# Patient Record
Sex: Female | Born: 1935
Health system: Southern US, Community
[De-identification: ages and names within clinical notes are randomized; demographics above are authoritative.]

## PROBLEM LIST (undated history)

## (undated) DIAGNOSIS — M316 Other giant cell arteritis: Secondary | ICD-10-CM

## (undated) DIAGNOSIS — M810 Age-related osteoporosis without current pathological fracture: Secondary | ICD-10-CM

## (undated) DIAGNOSIS — R32 Unspecified urinary incontinence: Secondary | ICD-10-CM

## (undated) DIAGNOSIS — M069 Rheumatoid arthritis, unspecified: Secondary | ICD-10-CM

## (undated) DIAGNOSIS — G35 Multiple sclerosis: Secondary | ICD-10-CM

## (undated) DIAGNOSIS — Z780 Asymptomatic menopausal state: Secondary | ICD-10-CM

## (undated) DIAGNOSIS — M130 Polyarthritis, unspecified: Secondary | ICD-10-CM

## (undated) DIAGNOSIS — I872 Venous insufficiency (chronic) (peripheral): Secondary | ICD-10-CM

## (undated) HISTORY — DX: Polyarthritis, unspecified: M13.0

## (undated) HISTORY — DX: Venous insufficiency (chronic) (peripheral): I87.2

## (undated) HISTORY — DX: Age-related osteoporosis without current pathological fracture: M81.0

## (undated) HISTORY — DX: Other giant cell arteritis: M31.6

## (undated) HISTORY — DX: Unspecified urinary incontinence: R32

## (undated) HISTORY — DX: Asymptomatic menopausal state: Z78.0

## (undated) HISTORY — DX: Multiple sclerosis: G35

## (undated) HISTORY — DX: Rheumatoid arthritis, unspecified: M06.9

---

## 1969-12-09 HISTORY — PX: ABDOMINAL HYSTERECTOMY: SHX81

## 1995-05-10 HISTORY — PX: OTHER SURGICAL HISTORY: SHX169

## 1998-05-26 ENCOUNTER — Ambulatory Visit (HOSPITAL_COMMUNITY): Admission: RE | Admit: 1998-05-26 | Discharge: 1998-05-26 | Payer: Self-pay | Admitting: Infectious Diseases

## 2000-02-20 ENCOUNTER — Ambulatory Visit (HOSPITAL_BASED_OUTPATIENT_CLINIC_OR_DEPARTMENT_OTHER): Admission: RE | Admit: 2000-02-20 | Discharge: 2000-02-20 | Payer: Self-pay | Admitting: Orthopedic Surgery

## 2000-02-25 ENCOUNTER — Encounter: Admission: RE | Admit: 2000-02-25 | Discharge: 2000-04-04 | Payer: Self-pay | Admitting: Orthopedic Surgery

## 2000-06-12 ENCOUNTER — Other Ambulatory Visit: Admission: RE | Admit: 2000-06-12 | Discharge: 2000-06-12 | Payer: Self-pay | Admitting: Obstetrics and Gynecology

## 2000-09-11 ENCOUNTER — Encounter: Admission: RE | Admit: 2000-09-11 | Discharge: 2000-10-09 | Payer: Self-pay | Admitting: Orthopedic Surgery

## 2001-08-05 ENCOUNTER — Ambulatory Visit (HOSPITAL_COMMUNITY): Admission: RE | Admit: 2001-08-05 | Discharge: 2001-08-05 | Payer: Self-pay | Admitting: *Deleted

## 2001-12-18 ENCOUNTER — Encounter: Admission: RE | Admit: 2001-12-18 | Discharge: 2002-01-08 | Payer: Self-pay | Admitting: Orthopedic Surgery

## 2005-03-13 ENCOUNTER — Encounter: Admission: RE | Admit: 2005-03-13 | Discharge: 2005-03-13 | Payer: Self-pay | Admitting: Family Medicine

## 2009-03-12 ENCOUNTER — Inpatient Hospital Stay (HOSPITAL_COMMUNITY): Admission: EM | Admit: 2009-03-12 | Discharge: 2009-03-16 | Payer: Medicare Other | Admitting: Emergency Medicine

## 2009-12-09 DIAGNOSIS — M316 Other giant cell arteritis: Secondary | ICD-10-CM

## 2009-12-09 HISTORY — DX: Other giant cell arteritis: M31.6

## 2010-04-03 ENCOUNTER — Emergency Department (HOSPITAL_COMMUNITY): Admission: EM | Admit: 2010-04-03 | Discharge: 2010-04-03 | Payer: Self-pay | Admitting: Emergency Medicine

## 2010-04-17 ENCOUNTER — Encounter: Admission: RE | Admit: 2010-04-17 | Discharge: 2010-04-17 | Payer: Self-pay | Admitting: Surgery

## 2010-04-18 ENCOUNTER — Ambulatory Visit (HOSPITAL_BASED_OUTPATIENT_CLINIC_OR_DEPARTMENT_OTHER): Admission: RE | Admit: 2010-04-18 | Discharge: 2010-04-18 | Payer: Self-pay | Admitting: Surgery

## 2011-02-26 LAB — BASIC METABOLIC PANEL
BUN: 13 mg/dL (ref 6–23)
CO2: 29 mEq/L (ref 19–32)
Chloride: 95 mEq/L — ABNORMAL LOW (ref 96–112)
Creatinine, Ser: 0.81 mg/dL (ref 0.4–1.2)
GFR calc Af Amer: >60 mL/min (ref >60)
Sodium: 130 mEq/L — ABNORMAL LOW (ref 135–145)

## 2011-03-20 LAB — BASIC METABOLIC PANEL
BUN: 7 mg/dL (ref 6–23)
CO2: 27 mEq/L (ref 19–32)
CO2: 28 mEq/L (ref 19–32)
Chloride: 101 mEq/L (ref 96–112)
Creatinine, Ser: 0.65 mg/dL (ref 0.4–1.2)
GFR calc Af Amer: >60 mL/min (ref >60)
GFR calc Af Amer: >60 mL/min (ref >60)
GFR calc non Af Amer: >60 mL/min (ref >60)
GFR calc non Af Amer: >60 mL/min (ref >60)
Glucose, Bld: 113 mg/dL — ABNORMAL HIGH (ref 70–99)
Glucose, Bld: 115 mg/dL — ABNORMAL HIGH (ref 70–99)
Glucose, Bld: 151 mg/dL — ABNORMAL HIGH (ref 70–99)
Potassium: 4.3 mEq/L (ref 3.5–5.1)
Sodium: 131 mEq/L — ABNORMAL LOW (ref 135–145)
Sodium: 135 mEq/L (ref 135–145)

## 2011-03-20 LAB — COMPREHENSIVE METABOLIC PANEL
AST: 39 U/L — ABNORMAL HIGH (ref 0–37)
CO2: 22 mEq/L (ref 19–32)
Creatinine, Ser: 0.72 mg/dL (ref 0.4–1.2)
GFR calc Af Amer: >60 mL/min (ref >60)
GFR calc non Af Amer: >60 mL/min (ref >60)
Total Protein: 6.5 g/dL (ref 6.0–8.3)

## 2011-03-20 LAB — CBC
Hemoglobin: 13.3 g/dL (ref 12.0–15.0)
MCHC: 34.3 g/dL (ref 30.0–36.0)
MCHC: 34.8 g/dL (ref 30.0–36.0)
MCV: 90.8 fL (ref 78.0–100.0)
MCV: 91.1 fL (ref 78.0–100.0)
Platelets: 146 10*3/uL — ABNORMAL LOW (ref 150–400)
Platelets: 182 10*3/uL (ref 150–400)
RBC: 4.22 MIL/uL (ref 3.87–5.11)
RDW: 13 % (ref 11.5–15.5)
RDW: 13.1 % (ref 11.5–15.5)
WBC: 10.9 10*3/uL — ABNORMAL HIGH (ref 4.0–10.5)
WBC: 6.9 10*3/uL (ref 4.0–10.5)

## 2011-03-20 LAB — MISCELLANEOUS TEST

## 2011-04-23 NOTE — Discharge Summary (Signed)
NAMEGEANIE, PACIFICO                 ACCOUNT NO.:  [id number]   MEDICAL RECORD NO.:  [id number]          PATIENT TYPE:  INP   LOCATION:  1540                         FACILITY:  [facility]   PHYSICIAN:  Kela Millin, M.D.DATE OF BIRTH:  01/05/1932   DATE OF ADMISSION:  03/12/2009  DATE OF DISCHARGE:  03/16/2009                               DISCHARGE SUMMARY   DISCHARGE DIAGNOSES:  1. Left superior and inferior ischial pubic rami fracture.  2. Hyponatremia.  3. Osteoporosis.  4. History of partial rotator cuff tear, status post right shoulder      arthroscopy per Dr. Lajoyce Corners.  5. Constipation, secondary to narcotic pain medications, improved.   PROCEDURES AND STUDIES:  Left hip x-ray:  Negative for hip fracture;  fracture of the left superior and inferior ischial pubic rami.   BRIEF HISTORY:  The patient is a 77 year old white female, with the  above-listed medical problems, who presented with left hip pain, status  post fall.  She reported that she had just been processing down the  aisle in church in her choir robe when she tripped and fell.  She denied  dizziness, palpitations, focal weakness and no blurry vision.  After the  fall, she began having hip pain, was brought to the ED and x-ray of the  hip was done and the results as stated above.  The ED physician  consulted orthopedics and they stated that no surgical intervention was  needed.  They recommended weightbearing as tolerated with physical  therapy and requested admission to the medicine service.   Please see the full dictated admission history and physical for the  details of the admission physical exam as well as the laboratory data.   HOSPITAL COURSE:  1. Left superior and inferior ischial pubic rami fractures.  Upon      admission the patient was started on IV analgesics for pain control      and muscle relaxants as well.  As already mentioned above,      orthopedics on admission recommended physical therapy with   weightbearing as tolerated.  The patient's pain has gradually      improved and is now controlled on oral narcotics.  She has had      difficulties with nausea and vomiting associated with the pain      medications which has improved.  Physical therapy has followed the      patient during this hospital stay and have recommended a short term      skilled nursing.  Social work assisted with placement and the      family is in agreement with placement for rehab at [facility].  2. Osteoporosis.  The patient is to continue her outpatient      medications upon discharge.  3. Constipation.  The patient was started on Dulcolax and Senokot with      some relief.  She is to continue this upon discharge.  4. Hyponatremia, likely secondary to volume depletion.  The patient      received hydration with IV fluids.  5. History of partial rotator cuff tear, status  post arthroscopy and      debridement in the past per Dr. Lajoyce Corners.   DISCHARGE MEDICATIONS:  1. Skelaxin 800 mg p.o. q.i.d. for muscle spasms.  2. Senokot 2 p.o. q.h.s., hold if diarrhea.  3. Dulcolax 10 mg p.o./per rectum daily p.r.n. constipation.  4. Percocet 1-2 tablets p.o. q.4-6 hours p.r.n.  5. The patient to continue vitamin D 50,000 units p.o. once weekly on      Tuesday.  6. Continue Fosamax 70 mg p.o. once weekly on Saturdays.  7. Premarin 0.3 mg p.o. daily.  8. Glucosamine chondroitin 1 p.o. t.i.d.  9. Calcium 500 mg 1 p.o. t.i.d.  10.Vitamin B Forte 1 p.o. daily.   FOLLOW-UP CARE:  1. Dr. Lajoyce Corners, call for appointment upon discharge.  2. Nursing home physician in 1-2 days.  3. Dr. Manus Gunning, to be indicated per nursing home physician upon      discharge.  4. PT, OT at nursing facility.   DISCHARGE CONDITION -:  Improved and stable.      Kela Millin, M.D.  Electronically Signed     ACV/MEDQ  D:  03/16/2009  T:  03/16/2009  Job:  [id number]   cc:   Bryan Lemma. Manus Gunning, M.D.  Fax: [phone]   Nadara Mustard, MD  Fax:  [phone]

## 2011-04-23 NOTE — H&P (Signed)
Brown, Gabriela                 ACCOUNT NO.:  [id number]   MEDICAL RECORD NO.:  [id number]          PATIENT TYPE:  INP   LOCATION:  0104                         FACILITY:  [facility]   PHYSICIAN:  Kela Millin, M.D.DATE OF BIRTH:  06-18-1932   DATE OF ADMISSION:  03/12/2009  DATE OF DISCHARGE:                              HISTORY & PHYSICAL   PRIMARY CARE PHYSICIAN:  Bryan Lemma. Ehinger, M.D.   CHIEF COMPLAINT:  Left hip pain status post fall.   HISTORY OF PRESENT ILLNESS:  The patient is a 77 year old white female  with past medical history significant for osteoporosis who presents with  above complaints.  She states that she is otherwise very healthy and  walks a mile every day.  She was in church just proceeding down the  aisle in her choir robe and she thinks she might have tripped on her  robe or on the carpet and fell.  She denies any dizziness, palpitations,  focal weakness or blurred vision.  After the fall she began having left  hip pain and so was brought to the ED.   In the ED she had an x-ray which showed left superior and inferior  ischiopubic ramus fractures.  The ED physician consulted orthopedics -  spoke with Dr. Magnus Ivan on call for Dr. Lajoyce Corners and weightbearing as  tolerated recommended and admission to the medicine service.  She denies  any shortness of breath.  No dysuria, cough or leg swelling.   PAST MEDICAL HISTORY:  As above.  Also history of partial rotator cuff  tear and status post right shoulder arthroscopy with debridement.   MEDICATIONS:  1. Fosamax 70 q. weekly.  2. Vitamin D 50,000 units q. Tuesday.  3. Premarin 0.5 mg daily.  4. Glucosamine chondroitin.  5. Calcium 500 t.i.d.  6. Vitamin B 40.   ALLERGIES:  ASPIRIN, SULFA.   SOCIAL HISTORY:  She denies tobacco.  Also denies alcohol.   FAMILY HISTORY:  Her father had strokes.   REVIEW OF SYSTEMS:  As per HPI, other review of systems negative.   PHYSICAL EXAM:  GENERAL:  In general the  patient is a pleasant, elderly  white female who appears to be in moderate distress secondary to pain,  in no respiratory distress.  VITAL SIGNS:  Her temperature is 97.9 with a blood pressure of 129/62,  pulse of 93, respiratory rate of 20, O2 sat of 98%.  HEENT:  PERRL, EOMI, sclerae anicteric.  Moist mucous membranes and no  oral exudates.  NECK:  Supple.  No adenopathy, no thyromegaly and no JVD.  LUNGS:  Clear to auscultation bilaterally.  No crackles or wheezes.  CARDIOVASCULAR:  Regular rate and rhythm.  Normal S1 and S2.  ABDOMEN:  Soft.  Normal bowel sounds present.  Nontender.  Nondistended.  No hepatomegaly and no masses palpable.  EXTREMITIES:  No cyanosis and no edema.  Range of motion was limited in  the left lower extremity secondary to pain.  NEUROLOGICAL:  Alert and oriented x3.  Cranial nerves II-XII grossly  intact.  Nonfocal exam.   LABORATORY DATA:  As per HPI.   ASSESSMENT AND PLAN:  1. Left superior and inferior ischiopubic rami fractures - as      discussed above will admit for pain management.  Consult PT/OT,      weightbearing as tolerated.  Orthopedics consulted in the ED as      discussed above.  2. Osteoporosis - continue outpatient medications.      Kela Millin, M.D.  Electronically Signed     ACV/MEDQ  D:  03/12/2009  T:  03/12/2009  Job:  [id number]   cc:   Bryan Lemma. Manus Gunning, M.D.  Fax: [phone]   Nadara Mustard, MD  Fax: [phone]

## 2011-04-26 NOTE — Op Note (Signed)
[place]  Patient:    [name], [name]                          MRN: [id number] Proc. Date: 02/20/00 Attending:  Nadara Mustard, M.D.                           Operative Report  PREOPERATIVE DIAGNOSIS:  Frozen right shoulder with rotator cuff tear.  POSTOPERATIVE DIAGNOSES: 1. Frozen right shoulder. 2. Partial rotator cuff tear. 3. Grade 1 superior labrum anterior and posterior lesion. 4. Subscapularis adhesions. 5. Impingement syndrome with type 3 acromion.  PROCEDURE: 1. Manipulation under anesthesia. 2. Right shoulder arthroscopy with massive debridement of partial rotator cuff    tear, and debridement of SLAP lesion, and debridement of subscapularis adhesive    bursitis. 3. Subacromial decompression with partial bursectomy.  SURGEON:  Nadara Mustard, M.D.  ASSISTANT:  _______.  ANESTHESIA:  General endotracheal.  ESTIMATED BLOOD LOSS:  Minimal.  ANTIBIOTICS:  None.  DRAINS:  None.  COMPLICATIONS:  None.  Alpha pump used for 48 hours postoperative pain medication with 150 cc of 0.5% Marcaine plain.  DISPOSITION:  To PACU in stable condition.  INDICATIONS FOR PROCEDURE:  The patient is a 77 year old woman who has had a prolonged course of frozen right shoulder pain with activities of daily living.  She has failed conservative care with injection therapy, nonsteroidals, and presents at this time for arthroscopic intervention.  The risks and benefits have been discussed including infection, neurovascular injury, persistent pain, and persistent adhesions.  The patient states she understands and wishes to proceed at this time.  DESCRIPTION OF PROCEDURE:  The patient was brought to OR room #5 and underwent  general endotracheal anesthetic.  After an adequate level of anesthesia was obtained, the patients right upper extremity was prepped using DuraPrep, and draped into the sterile field with the patient in the beach chair  position. The scope was inserted through the posterior portal into the shoulder joint, and the instruments were inserted through the anterior portal with an 18-gauge needle used to localize the anterior portal from the outside in technique.  The visualization after manipulation under anesthesia showed massive subscapularis bursal adhesions to the subscapularis.  There was a large grade 1 SLAP lesion, as well as large partial thickness rotator cuff tear.  The vapor Mitek was used for hemostasis and the shaver was used for debridement, and the above mentioned injuries were debrided with the shaver and the vapor Mitek.  The patient had good range of motion after the manipulation under anesthesia.  The instruments were removed and then inserted into the subacromial space with he shaver from the lateral portal and the scope from the posterior portal.  A subacromial decompression was performed.  She also had significant osteoarthritis of the [facility] joint.  This was debrided.  She had a good decompression.  The instruments were removed.  The Alpha pump 4 cc tube was then inserted into the subacromial space with it attached to the Alpha pump.  The portals were closed using 3-0 nylon.  The wound were covered with Adaptic, orthopedic sponges, ABD dressing, and Hypafix tape.  The patient was extubated and taken to PACU in stable condition.  Plan to remove the drain in 48 hours and the dressing in 48 hours.  Followup in the office in ne week.  The patient has pain medicine prescriptions. DD:  02/20/00 TD:  02/20/00 Job: [id number]

## 2011-10-10 HISTORY — PX: COLONOSCOPY: SHX174

## 2012-01-10 DIAGNOSIS — Z0000 Encounter for general adult medical examination without abnormal findings: Secondary | ICD-10-CM | POA: Diagnosis not present

## 2012-01-10 DIAGNOSIS — E7800 Pure hypercholesterolemia, unspecified: Secondary | ICD-10-CM | POA: Diagnosis not present

## 2012-01-10 DIAGNOSIS — E559 Vitamin D deficiency, unspecified: Secondary | ICD-10-CM | POA: Diagnosis not present

## 2012-01-10 DIAGNOSIS — Z124 Encounter for screening for malignant neoplasm of cervix: Secondary | ICD-10-CM | POA: Diagnosis not present

## 2012-01-10 DIAGNOSIS — Z01419 Encounter for gynecological examination (general) (routine) without abnormal findings: Secondary | ICD-10-CM | POA: Diagnosis not present

## 2012-01-14 DIAGNOSIS — M810 Age-related osteoporosis without current pathological fracture: Secondary | ICD-10-CM | POA: Diagnosis not present

## 2012-01-24 DIAGNOSIS — E871 Hypo-osmolality and hyponatremia: Secondary | ICD-10-CM | POA: Diagnosis not present

## 2012-01-24 DIAGNOSIS — M810 Age-related osteoporosis without current pathological fracture: Secondary | ICD-10-CM | POA: Diagnosis not present

## 2012-01-24 DIAGNOSIS — Z8262 Family history of osteoporosis: Secondary | ICD-10-CM | POA: Diagnosis not present

## 2012-01-27 DIAGNOSIS — E559 Vitamin D deficiency, unspecified: Secondary | ICD-10-CM | POA: Diagnosis not present

## 2012-01-27 DIAGNOSIS — R498 Other voice and resonance disorders: Secondary | ICD-10-CM | POA: Diagnosis not present

## 2012-01-27 DIAGNOSIS — E039 Hypothyroidism, unspecified: Secondary | ICD-10-CM | POA: Diagnosis not present

## 2012-01-27 DIAGNOSIS — E7800 Pure hypercholesterolemia, unspecified: Secondary | ICD-10-CM | POA: Diagnosis not present

## 2012-01-27 DIAGNOSIS — M810 Age-related osteoporosis without current pathological fracture: Secondary | ICD-10-CM | POA: Diagnosis not present

## 2012-01-27 DIAGNOSIS — E871 Hypo-osmolality and hyponatremia: Secondary | ICD-10-CM | POA: Diagnosis not present

## 2012-02-13 DIAGNOSIS — R490 Dysphonia: Secondary | ICD-10-CM | POA: Diagnosis not present

## 2012-03-19 DIAGNOSIS — Z1231 Encounter for screening mammogram for malignant neoplasm of breast: Secondary | ICD-10-CM | POA: Diagnosis not present

## 2012-03-26 DIAGNOSIS — E039 Hypothyroidism, unspecified: Secondary | ICD-10-CM | POA: Diagnosis not present

## 2012-07-28 DIAGNOSIS — E039 Hypothyroidism, unspecified: Secondary | ICD-10-CM | POA: Diagnosis not present

## 2012-07-28 DIAGNOSIS — M810 Age-related osteoporosis without current pathological fracture: Secondary | ICD-10-CM | POA: Diagnosis not present

## 2012-07-28 DIAGNOSIS — E871 Hypo-osmolality and hyponatremia: Secondary | ICD-10-CM | POA: Diagnosis not present

## 2012-07-28 DIAGNOSIS — R609 Edema, unspecified: Secondary | ICD-10-CM | POA: Diagnosis not present

## 2012-07-28 DIAGNOSIS — E559 Vitamin D deficiency, unspecified: Secondary | ICD-10-CM | POA: Diagnosis not present

## 2012-10-13 DIAGNOSIS — Z23 Encounter for immunization: Secondary | ICD-10-CM | POA: Diagnosis not present

## 2013-02-17 DIAGNOSIS — R609 Edema, unspecified: Secondary | ICD-10-CM | POA: Diagnosis not present

## 2013-02-17 DIAGNOSIS — M810 Age-related osteoporosis without current pathological fracture: Secondary | ICD-10-CM | POA: Diagnosis not present

## 2013-02-17 DIAGNOSIS — E559 Vitamin D deficiency, unspecified: Secondary | ICD-10-CM | POA: Diagnosis not present

## 2013-02-17 DIAGNOSIS — E039 Hypothyroidism, unspecified: Secondary | ICD-10-CM | POA: Diagnosis not present

## 2013-02-17 DIAGNOSIS — E871 Hypo-osmolality and hyponatremia: Secondary | ICD-10-CM | POA: Diagnosis not present

## 2013-02-17 DIAGNOSIS — R42 Dizziness and giddiness: Secondary | ICD-10-CM | POA: Diagnosis not present

## 2013-02-18 DIAGNOSIS — R269 Unspecified abnormalities of gait and mobility: Secondary | ICD-10-CM | POA: Diagnosis not present

## 2013-03-02 DIAGNOSIS — R269 Unspecified abnormalities of gait and mobility: Secondary | ICD-10-CM | POA: Diagnosis not present

## 2013-03-04 DIAGNOSIS — R269 Unspecified abnormalities of gait and mobility: Secondary | ICD-10-CM | POA: Diagnosis not present

## 2013-03-05 ENCOUNTER — Telehealth: Payer: Self-pay | Admitting: Nurse Practitioner

## 2013-03-05 MED ORDER — ESTRADIOL 0.5 MG PO TABS: ORAL_TABLET | ORAL | Status: DC

## 2013-03-05 NOTE — Telephone Encounter (Signed)
Refill to [facility], give pt a call when refill of estrodial is called in /nr

## 2013-03-05 NOTE — Telephone Encounter (Signed)
03/05/13 rx for estradiol 0.5mg  sent to [facility]. Pt is to take 1/2 tab daily. Pt has aex 5/14. Patient notified

## 2013-03-16 DIAGNOSIS — R269 Unspecified abnormalities of gait and mobility: Secondary | ICD-10-CM | POA: Diagnosis not present

## 2013-03-25 DIAGNOSIS — Z1231 Encounter for screening mammogram for malignant neoplasm of breast: Secondary | ICD-10-CM | POA: Diagnosis not present

## 2013-03-26 DIAGNOSIS — R269 Unspecified abnormalities of gait and mobility: Secondary | ICD-10-CM | POA: Diagnosis not present

## 2013-04-15 DIAGNOSIS — L84 Corns and callosities: Secondary | ICD-10-CM | POA: Diagnosis not present

## 2013-04-15 DIAGNOSIS — D485 Neoplasm of uncertain behavior of skin: Secondary | ICD-10-CM | POA: Diagnosis not present

## 2013-04-15 DIAGNOSIS — L259 Unspecified contact dermatitis, unspecified cause: Secondary | ICD-10-CM | POA: Diagnosis not present

## 2013-04-16 ENCOUNTER — Encounter: Payer: Self-pay | Admitting: *Deleted

## 2013-04-19 ENCOUNTER — Encounter: Payer: Self-pay | Admitting: Nurse Practitioner

## 2013-04-19 ENCOUNTER — Ambulatory Visit (INDEPENDENT_AMBULATORY_CARE_PROVIDER_SITE_OTHER): Payer: Medicare Other | Admitting: Nurse Practitioner

## 2013-04-19 VITALS — BP 114/62 | HR 66 | Resp 12 | Ht 65.5 in | Wt 124.2 lb

## 2013-04-19 DIAGNOSIS — N952 Postmenopausal atrophic vaginitis: Secondary | ICD-10-CM

## 2013-04-19 DIAGNOSIS — Z01419 Encounter for gynecological examination (general) (routine) without abnormal findings: Secondary | ICD-10-CM | POA: Diagnosis not present

## 2013-04-19 MED ORDER — ESTRADIOL 0.5 MG PO TABS: ORAL_TABLET | ORAL | Status: DC

## 2013-04-19 NOTE — Patient Instructions (Addendum)
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
## 2013-04-22 NOTE — Progress Notes (Signed)
Encounter reviewed by Dr. Brook Silva.  

## 2013-08-02 DIAGNOSIS — R609 Edema, unspecified: Secondary | ICD-10-CM | POA: Diagnosis not present

## 2013-08-02 DIAGNOSIS — M79609 Pain in unspecified limb: Secondary | ICD-10-CM | POA: Diagnosis not present

## 2013-08-03 DIAGNOSIS — M48061 Spinal stenosis, lumbar region without neurogenic claudication: Secondary | ICD-10-CM | POA: Diagnosis not present

## 2013-08-18 DIAGNOSIS — M47817 Spondylosis without myelopathy or radiculopathy, lumbosacral region: Secondary | ICD-10-CM | POA: Diagnosis not present

## 2013-08-20 DIAGNOSIS — M810 Age-related osteoporosis without current pathological fracture: Secondary | ICD-10-CM | POA: Diagnosis not present

## 2013-08-20 DIAGNOSIS — E559 Vitamin D deficiency, unspecified: Secondary | ICD-10-CM | POA: Diagnosis not present

## 2013-08-20 DIAGNOSIS — E039 Hypothyroidism, unspecified: Secondary | ICD-10-CM | POA: Diagnosis not present

## 2013-08-20 DIAGNOSIS — E871 Hypo-osmolality and hyponatremia: Secondary | ICD-10-CM | POA: Diagnosis not present

## 2013-08-20 DIAGNOSIS — R609 Edema, unspecified: Secondary | ICD-10-CM | POA: Diagnosis not present

## 2013-08-24 DIAGNOSIS — R262 Difficulty in walking, not elsewhere classified: Secondary | ICD-10-CM | POA: Diagnosis not present

## 2013-09-06 DIAGNOSIS — M47817 Spondylosis without myelopathy or radiculopathy, lumbosacral region: Secondary | ICD-10-CM | POA: Diagnosis not present

## 2013-09-06 DIAGNOSIS — M1990 Unspecified osteoarthritis, unspecified site: Secondary | ICD-10-CM | POA: Diagnosis not present

## 2013-09-06 DIAGNOSIS — G5790 Unspecified mononeuropathy of unspecified lower limb: Secondary | ICD-10-CM | POA: Diagnosis not present

## 2013-09-06 DIAGNOSIS — M5137 Other intervertebral disc degeneration, lumbosacral region: Secondary | ICD-10-CM | POA: Diagnosis not present

## 2013-09-16 DIAGNOSIS — R269 Unspecified abnormalities of gait and mobility: Secondary | ICD-10-CM | POA: Diagnosis not present

## 2013-09-20 DIAGNOSIS — R269 Unspecified abnormalities of gait and mobility: Secondary | ICD-10-CM | POA: Diagnosis not present

## 2013-09-24 DIAGNOSIS — R269 Unspecified abnormalities of gait and mobility: Secondary | ICD-10-CM | POA: Diagnosis not present

## 2013-09-28 DIAGNOSIS — R269 Unspecified abnormalities of gait and mobility: Secondary | ICD-10-CM | POA: Diagnosis not present

## 2013-09-30 DIAGNOSIS — H35369 Drusen (degenerative) of macula, unspecified eye: Secondary | ICD-10-CM | POA: Diagnosis not present

## 2013-09-30 DIAGNOSIS — Z961 Presence of intraocular lens: Secondary | ICD-10-CM | POA: Diagnosis not present

## 2013-10-01 DIAGNOSIS — R269 Unspecified abnormalities of gait and mobility: Secondary | ICD-10-CM | POA: Diagnosis not present

## 2013-10-04 DIAGNOSIS — G894 Chronic pain syndrome: Secondary | ICD-10-CM | POA: Diagnosis not present

## 2013-10-04 DIAGNOSIS — Z79899 Other long term (current) drug therapy: Secondary | ICD-10-CM | POA: Diagnosis not present

## 2013-10-04 DIAGNOSIS — IMO0001 Reserved for inherently not codable concepts without codable children: Secondary | ICD-10-CM | POA: Diagnosis not present

## 2013-10-04 DIAGNOSIS — M4120 Other idiopathic scoliosis, site unspecified: Secondary | ICD-10-CM | POA: Diagnosis not present

## 2013-10-06 DIAGNOSIS — R269 Unspecified abnormalities of gait and mobility: Secondary | ICD-10-CM | POA: Diagnosis not present

## 2013-10-08 DIAGNOSIS — R269 Unspecified abnormalities of gait and mobility: Secondary | ICD-10-CM | POA: Diagnosis not present

## 2013-10-11 DIAGNOSIS — R269 Unspecified abnormalities of gait and mobility: Secondary | ICD-10-CM | POA: Diagnosis not present

## 2013-10-12 DIAGNOSIS — Z23 Encounter for immunization: Secondary | ICD-10-CM | POA: Diagnosis not present

## 2013-10-13 DIAGNOSIS — H43819 Vitreous degeneration, unspecified eye: Secondary | ICD-10-CM | POA: Diagnosis not present

## 2013-10-13 DIAGNOSIS — H35379 Puckering of macula, unspecified eye: Secondary | ICD-10-CM | POA: Diagnosis not present

## 2013-10-13 DIAGNOSIS — H353190 Nonexudative age-related macular degeneration, unspecified eye, stage unspecified: Secondary | ICD-10-CM | POA: Diagnosis not present

## 2013-10-15 DIAGNOSIS — R269 Unspecified abnormalities of gait and mobility: Secondary | ICD-10-CM | POA: Diagnosis not present

## 2013-10-18 DIAGNOSIS — R269 Unspecified abnormalities of gait and mobility: Secondary | ICD-10-CM | POA: Diagnosis not present

## 2013-10-20 DIAGNOSIS — G959 Disease of spinal cord, unspecified: Secondary | ICD-10-CM | POA: Diagnosis not present

## 2013-10-20 DIAGNOSIS — R269 Unspecified abnormalities of gait and mobility: Secondary | ICD-10-CM | POA: Diagnosis not present

## 2013-10-21 ENCOUNTER — Other Ambulatory Visit: Payer: Self-pay | Admitting: Neurology

## 2013-10-21 DIAGNOSIS — R269 Unspecified abnormalities of gait and mobility: Secondary | ICD-10-CM

## 2013-10-22 DIAGNOSIS — R269 Unspecified abnormalities of gait and mobility: Secondary | ICD-10-CM | POA: Diagnosis not present

## 2013-10-25 DIAGNOSIS — R269 Unspecified abnormalities of gait and mobility: Secondary | ICD-10-CM | POA: Diagnosis not present

## 2013-10-29 DIAGNOSIS — R269 Unspecified abnormalities of gait and mobility: Secondary | ICD-10-CM | POA: Diagnosis not present

## 2013-10-30 ENCOUNTER — Ambulatory Visit
Admission: RE | Admit: 2013-10-30 | Discharge: 2013-10-30 | Disposition: A | Discharge status: Home / Self Care | Payer: PRIVATE HEALTH INSURANCE | Source: Ambulatory Visit | Attending: Neurology | Admitting: Neurology

## 2013-10-30 ENCOUNTER — Ambulatory Visit
Admission: RE | Admit: 2013-10-30 | Discharge: 2013-10-30 | Disposition: A | Discharge status: Home / Self Care | Payer: Medicare Other | Source: Ambulatory Visit | Attending: Neurology | Admitting: Neurology

## 2013-10-30 DIAGNOSIS — M47812 Spondylosis without myelopathy or radiculopathy, cervical region: Secondary | ICD-10-CM | POA: Diagnosis not present

## 2013-10-30 DIAGNOSIS — R269 Unspecified abnormalities of gait and mobility: Secondary | ICD-10-CM

## 2013-10-30 DIAGNOSIS — M5090 Cervical disc disorder, unspecified, unspecified cervical region: Secondary | ICD-10-CM | POA: Diagnosis not present

## 2013-10-30 DIAGNOSIS — M47814 Spondylosis without myelopathy or radiculopathy, thoracic region: Secondary | ICD-10-CM | POA: Diagnosis not present

## 2013-11-01 ENCOUNTER — Other Ambulatory Visit: Payer: Self-pay | Admitting: Neurology

## 2013-11-01 DIAGNOSIS — R269 Unspecified abnormalities of gait and mobility: Secondary | ICD-10-CM | POA: Diagnosis not present

## 2013-11-01 DIAGNOSIS — G959 Disease of spinal cord, unspecified: Secondary | ICD-10-CM

## 2013-11-11 ENCOUNTER — Ambulatory Visit
Admission: RE | Admit: 2013-11-11 | Discharge: 2013-11-11 | Disposition: A | Discharge status: Home / Self Care | Payer: PRIVATE HEALTH INSURANCE | Source: Ambulatory Visit | Attending: Neurology | Admitting: Neurology

## 2013-11-11 DIAGNOSIS — M5090 Cervical disc disorder, unspecified, unspecified cervical region: Secondary | ICD-10-CM | POA: Diagnosis not present

## 2013-11-11 DIAGNOSIS — G959 Disease of spinal cord, unspecified: Secondary | ICD-10-CM

## 2013-11-11 DIAGNOSIS — J438 Other emphysema: Secondary | ICD-10-CM | POA: Diagnosis not present

## 2013-11-11 DIAGNOSIS — M4000 Postural kyphosis, site unspecified: Secondary | ICD-10-CM | POA: Diagnosis not present

## 2013-11-11 DIAGNOSIS — R269 Unspecified abnormalities of gait and mobility: Secondary | ICD-10-CM

## 2013-11-11 MED ORDER — GADOBENATE DIMEGLUMINE 529 MG/ML IV SOLN
10.0000 mL | Freq: Once | INTRAVENOUS | Status: AC | PRN
  Administered 2013-11-11: 10 mL via INTRAVENOUS

## 2013-11-13 DIAGNOSIS — R269 Unspecified abnormalities of gait and mobility: Secondary | ICD-10-CM | POA: Diagnosis not present

## 2013-11-13 DIAGNOSIS — Z79899 Other long term (current) drug therapy: Secondary | ICD-10-CM | POA: Diagnosis not present

## 2013-11-13 DIAGNOSIS — G959 Disease of spinal cord, unspecified: Secondary | ICD-10-CM | POA: Diagnosis not present

## 2013-11-15 DIAGNOSIS — R269 Unspecified abnormalities of gait and mobility: Secondary | ICD-10-CM | POA: Diagnosis not present

## 2013-11-15 DIAGNOSIS — G959 Disease of spinal cord, unspecified: Secondary | ICD-10-CM | POA: Diagnosis not present

## 2013-11-15 DIAGNOSIS — Z79899 Other long term (current) drug therapy: Secondary | ICD-10-CM | POA: Diagnosis not present

## 2013-11-16 DIAGNOSIS — R269 Unspecified abnormalities of gait and mobility: Secondary | ICD-10-CM | POA: Diagnosis not present

## 2013-11-16 DIAGNOSIS — G35 Multiple sclerosis: Secondary | ICD-10-CM | POA: Diagnosis not present

## 2013-11-16 DIAGNOSIS — G959 Disease of spinal cord, unspecified: Secondary | ICD-10-CM | POA: Diagnosis not present

## 2013-11-19 DIAGNOSIS — L84 Corns and callosities: Secondary | ICD-10-CM | POA: Diagnosis not present

## 2013-11-19 DIAGNOSIS — D709 Neutropenia, unspecified: Secondary | ICD-10-CM | POA: Diagnosis not present

## 2013-11-19 DIAGNOSIS — R29818 Other symptoms and signs involving the nervous system: Secondary | ICD-10-CM | POA: Diagnosis not present

## 2013-11-22 DIAGNOSIS — R269 Unspecified abnormalities of gait and mobility: Secondary | ICD-10-CM | POA: Diagnosis not present

## 2013-12-06 ENCOUNTER — Encounter: Payer: Self-pay | Admitting: Nurse Practitioner

## 2013-12-09 DIAGNOSIS — G35 Multiple sclerosis: Secondary | ICD-10-CM

## 2013-12-09 HISTORY — DX: Multiple sclerosis: G35

## 2013-12-17 DIAGNOSIS — R269 Unspecified abnormalities of gait and mobility: Secondary | ICD-10-CM | POA: Diagnosis not present

## 2013-12-27 DIAGNOSIS — R269 Unspecified abnormalities of gait and mobility: Secondary | ICD-10-CM | POA: Diagnosis not present

## 2014-01-03 DIAGNOSIS — R269 Unspecified abnormalities of gait and mobility: Secondary | ICD-10-CM | POA: Diagnosis not present

## 2014-01-04 DIAGNOSIS — L820 Inflamed seborrheic keratosis: Secondary | ICD-10-CM | POA: Diagnosis not present

## 2014-01-04 DIAGNOSIS — L821 Other seborrheic keratosis: Secondary | ICD-10-CM | POA: Diagnosis not present

## 2014-01-10 DIAGNOSIS — R269 Unspecified abnormalities of gait and mobility: Secondary | ICD-10-CM | POA: Diagnosis not present

## 2014-01-13 DIAGNOSIS — Z8262 Family history of osteoporosis: Secondary | ICD-10-CM | POA: Diagnosis not present

## 2014-01-13 DIAGNOSIS — M949 Disorder of cartilage, unspecified: Secondary | ICD-10-CM | POA: Diagnosis not present

## 2014-01-13 DIAGNOSIS — M899 Disorder of bone, unspecified: Secondary | ICD-10-CM | POA: Diagnosis not present

## 2014-01-25 ENCOUNTER — Telehealth: Payer: Self-pay | Admitting: *Deleted

## 2014-01-25 NOTE — Telephone Encounter (Signed)
I have attempted to contact this patient by phone with the following results: no answer after 7 rings.

## 2014-01-31 DIAGNOSIS — L851 Acquired keratosis [keratoderma] palmaris et plantaris: Secondary | ICD-10-CM | POA: Diagnosis not present

## 2014-01-31 DIAGNOSIS — M2040 Other hammer toe(s) (acquired), unspecified foot: Secondary | ICD-10-CM | POA: Diagnosis not present

## 2014-02-14 DIAGNOSIS — G959 Disease of spinal cord, unspecified: Secondary | ICD-10-CM | POA: Diagnosis not present

## 2014-02-14 DIAGNOSIS — G35 Multiple sclerosis: Secondary | ICD-10-CM | POA: Diagnosis not present

## 2014-02-22 DIAGNOSIS — E871 Hypo-osmolality and hyponatremia: Secondary | ICD-10-CM | POA: Diagnosis not present

## 2014-02-22 DIAGNOSIS — M810 Age-related osteoporosis without current pathological fracture: Secondary | ICD-10-CM | POA: Diagnosis not present

## 2014-02-22 DIAGNOSIS — R609 Edema, unspecified: Secondary | ICD-10-CM | POA: Diagnosis not present

## 2014-02-22 DIAGNOSIS — E039 Hypothyroidism, unspecified: Secondary | ICD-10-CM | POA: Diagnosis not present

## 2014-02-22 DIAGNOSIS — E559 Vitamin D deficiency, unspecified: Secondary | ICD-10-CM | POA: Diagnosis not present

## 2014-02-22 DIAGNOSIS — G959 Disease of spinal cord, unspecified: Secondary | ICD-10-CM | POA: Diagnosis not present

## 2014-03-01 NOTE — Telephone Encounter (Signed)
Phone call to patient.  She has received results from Dr. Almetta Lovely office.

## 2014-03-21 DIAGNOSIS — M2010 Hallux valgus (acquired), unspecified foot: Secondary | ICD-10-CM | POA: Diagnosis not present

## 2014-03-21 DIAGNOSIS — M2040 Other hammer toe(s) (acquired), unspecified foot: Secondary | ICD-10-CM | POA: Diagnosis not present

## 2014-03-30 DIAGNOSIS — Z1231 Encounter for screening mammogram for malignant neoplasm of breast: Secondary | ICD-10-CM | POA: Diagnosis not present

## 2014-04-22 ENCOUNTER — Ambulatory Visit: Payer: Medicare Other | Admitting: Nurse Practitioner

## 2014-04-29 ENCOUNTER — Ambulatory Visit (INDEPENDENT_AMBULATORY_CARE_PROVIDER_SITE_OTHER): Payer: Medicare Other | Admitting: Nurse Practitioner

## 2014-04-29 ENCOUNTER — Encounter: Payer: Self-pay | Admitting: Nurse Practitioner

## 2014-04-29 VITALS — BP 130/64 | HR 80 | Ht 65.25 in | Wt 130.0 lb

## 2014-04-29 DIAGNOSIS — G35 Multiple sclerosis: Secondary | ICD-10-CM

## 2014-04-29 DIAGNOSIS — N952 Postmenopausal atrophic vaginitis: Secondary | ICD-10-CM

## 2014-04-29 DIAGNOSIS — Z79890 Hormone replacement therapy (postmenopausal): Secondary | ICD-10-CM | POA: Diagnosis not present

## 2014-04-29 DIAGNOSIS — Z124 Encounter for screening for malignant neoplasm of cervix: Secondary | ICD-10-CM | POA: Diagnosis not present

## 2014-04-29 DIAGNOSIS — M810 Age-related osteoporosis without current pathological fracture: Secondary | ICD-10-CM

## 2014-04-29 DIAGNOSIS — Z01419 Encounter for gynecological examination (general) (routine) without abnormal findings: Secondary | ICD-10-CM

## 2014-04-29 MED ORDER — ESTRADIOL 0.5 MG PO TABS: ORAL_TABLET | ORAL | Status: DC

## 2014-04-29 MED ORDER — ESTRADIOL 10 MCG VA TABS: 1.0000 | ORAL_TABLET | VAGINAL | Status: DC

## 2014-04-29 MED ORDER — VITAMIN D (ERGOCALCIFEROL) 1.25 MG (50000 UNIT) PO CAPS: 50000.0000 [IU] | ORAL_CAPSULE | ORAL | Status: DC

## 2014-04-29 NOTE — Progress Notes (Signed)
Patient ID: [name], female   DOB: August 07, 1932, 80 y.o.   MRN: [id number] 80 y.o. G2P2 Widowed Caucasian Fe here for annual exam.  She continues on ERT at 1/2 tablet daily.  She does have vaginal dryness but is not SA. Some bladder issues with urinary urgency but no incontinence.   She just lost her husband in August and is having a bad time coping with yet another husband that has passed. She had remarried in 6/ 2011 and he had a diagnosis of prostate cancer that he thought was in remission.  His 2 children have been very gracious and kind to her.  She left everything that belong to her husband for his children. About a month ago she continued having problems with balance and has now been diagnosed with MS.  She is using a cane and is very careful about going anywhere alone.  The left foot drop over the years is now thought to be due to [name] - not diagnosed then.  She has osteoporosis and knows she must be very careful to prevent falls.  She has a housekeeper that comes every other week and someone to do the lawn upkeep.  Patient's last menstrual period was 09/08/1970.          Sexually active: no  The current method of family planning is abstinence and status post hysterectomy.    Exercising: yes  Gym/ health club routine includes water exercises and machines. Smoker:  no  Health Maintenance: Pap: 06/12/2000 Normal  MMG: 03/30/14, 3D, benign Colonoscopy: 10/2011 normal repeat 10 years  BMD: 01/13/14, Osteoporosis TDaP: 2006  Labs:  PCP   reports that she has never smoked. She has never used smokeless tobacco. She reports that she drinks about 3.6 ounces of alcohol per week. She reports that she does not use illicit drugs.  Past Medical History  Diagnosis Date  . Post-menopausal   . Osteoporosis     osteopenia  . Multiple sclerosis     Past Surgical History  Procedure Laterality Date  . Colonoscopy  10/2011  . Breast ductectomy Right 05/1995  . Abdominal hysterectomy  1971    TVH     Current Outpatient Prescriptions  Medication Sig Dispense Refill  . Ascorbic Acid (VITAMIN C) 100 MG tablet Take 100 mg by mouth daily.      Marland Kitchen azelastine (ASTELIN) 137 MCG/SPRAY nasal spray Place 1 spray into the nose 2 (two) times daily. Use in each nostril as directed      . Biotin 1 MG CAPS Take by mouth daily.      . Calcium Carbonate-Vitamin D (CALCIUM + D PO) Take by mouth daily.      Marland Kitchen estradiol (ESTRACE) 0.5 MG tablet Take 1/2 tablet daily  90 tablet  3  . glucosamine-chondroitin 500-400 MG tablet Take 1 tablet by mouth 3 (three) times daily.      Marland Kitchen levothyroxine (SYNTHROID, LEVOTHROID) 25 MCG tablet Take 25 mcg by mouth daily.      . Magnesium 500 MG TABS Take 1 tablet by mouth daily.      . Vitamin D, Ergocalciferol, (DRISDOL) 50000 UNITS CAPS Take 50,000 Units by mouth every 7 (seven) days.      . vitamin E 100 UNIT capsule Take 100 Units by mouth daily.      . Estradiol 10 MCG TABS vaginal tablet Place 1 tablet (10 mcg total) vaginally 2 (two) times a week.  24 tablet  3  . fluocinonide cream (LIDEX) 0.05 %  4.09 application.       No current facility-administered medications for this visit.    Family History  Problem Relation Age of Onset  . Osteoarthritis Father     ROS:  Pertinent items are noted in HPI.  Otherwise, a comprehensive ROS was negative.  Exam:   BP 130/64  Pulse 80  Ht 5' 5.25" (1.657 m)  Wt 130 lb (58.968 kg)  BMI 21.48 kg/m2  LMP 09/08/1970 Height: 5' 5.25" (165.7 cm)  Ht Readings from Last 3 Encounters:  04/29/14 5' 5.25" (1.657 m)  04/19/13 5' 5.5" (1.664 m)    General appearance: alert, cooperative and appears stated age Head: Normocephalic, without obvious abnormality, atraumatic Neck: no adenopathy, supple, symmetrical, trachea midline and thyroid normal to inspection and palpation Lungs: clear to auscultation bilaterally Breasts: normal appearance, no masses or tenderness Heart: regular rate and rhythm Abdomen: soft, non-tender; no  masses,  no organomegaly Extremities: extremities normal, atraumatic, no cyanosis or edema Skin: Skin color, texture, turgor normal. No rashes or lesions Lymph nodes: Cervical, supraclavicular, and axillary nodes normal. No abnormal inguinal nodes palpated Neurologic: Grossly normal   Pelvic: External genitalia:  no lesions              Urethra:  normal appearing urethra with no masses, tenderness or lesions              Bartholin's and Skene's: normal                 Vagina: normal appearing vagina with normal color and discharge, no lesions              Cervix: absent              Pap taken: no Bimanual Exam:  Uterus:  uterus absent              Adnexa: no mass, fullness, tenderness               Rectovaginal: Confirms               Anus:  normal sphincter tone, no lesions  A:  Well Woman with normal exam  Postmenopausal on ERT  S/P TVH secondary to scar tissue 1971  History of Vit D deficiency  Osteoporosis followed by Dr. Chalmers Cater (past forteo use)  Grief reaction  P:   Reviewed health and wellness pertinent to exam  Pap smear not taken today  Love and Support is given  Mammogram is due 03/2015  Refill on Estrace 1/2 tablet daily and Vagifem usually once weekly for a year  Counseled with potential risk of DVT, CVA, cancer, etc.  Counseled on breast self exam, mammography screening, use and side effects of HRT, adequate intake of calcium and vitamin D, diet and exercise return annually or prn  An After Visit Summary was printed and given to the patient.

## 2014-04-29 NOTE — Patient Instructions (Signed)

## 2014-05-06 NOTE — Progress Notes (Signed)
Encounter reviewed by Dr. Brook Silva.  

## 2014-06-13 DIAGNOSIS — F3289 Other specified depressive episodes: Secondary | ICD-10-CM | POA: Diagnosis not present

## 2014-06-13 DIAGNOSIS — F329 Major depressive disorder, single episode, unspecified: Secondary | ICD-10-CM | POA: Diagnosis not present

## 2014-06-13 DIAGNOSIS — R5383 Other fatigue: Secondary | ICD-10-CM | POA: Diagnosis not present

## 2014-06-13 DIAGNOSIS — G35 Multiple sclerosis: Secondary | ICD-10-CM | POA: Diagnosis not present

## 2014-06-13 DIAGNOSIS — R5381 Other malaise: Secondary | ICD-10-CM | POA: Diagnosis not present

## 2014-08-09 DIAGNOSIS — G35 Multiple sclerosis: Secondary | ICD-10-CM | POA: Diagnosis not present

## 2014-08-26 DIAGNOSIS — G959 Disease of spinal cord, unspecified: Secondary | ICD-10-CM | POA: Diagnosis not present

## 2014-08-26 DIAGNOSIS — Z23 Encounter for immunization: Secondary | ICD-10-CM | POA: Diagnosis not present

## 2014-08-26 DIAGNOSIS — E871 Hypo-osmolality and hyponatremia: Secondary | ICD-10-CM | POA: Diagnosis not present

## 2014-08-26 DIAGNOSIS — M810 Age-related osteoporosis without current pathological fracture: Secondary | ICD-10-CM | POA: Diagnosis not present

## 2014-08-26 DIAGNOSIS — Z1331 Encounter for screening for depression: Secondary | ICD-10-CM | POA: Diagnosis not present

## 2014-08-26 DIAGNOSIS — E039 Hypothyroidism, unspecified: Secondary | ICD-10-CM | POA: Diagnosis not present

## 2014-08-26 DIAGNOSIS — E559 Vitamin D deficiency, unspecified: Secondary | ICD-10-CM | POA: Diagnosis not present

## 2014-08-26 DIAGNOSIS — R609 Edema, unspecified: Secondary | ICD-10-CM | POA: Diagnosis not present

## 2014-09-16 DIAGNOSIS — E871 Hypo-osmolality and hyponatremia: Secondary | ICD-10-CM | POA: Diagnosis not present

## 2014-09-20 DIAGNOSIS — H3531 Nonexudative age-related macular degeneration: Secondary | ICD-10-CM | POA: Diagnosis not present

## 2014-10-06 DIAGNOSIS — Z961 Presence of intraocular lens: Secondary | ICD-10-CM | POA: Diagnosis not present

## 2014-10-06 DIAGNOSIS — H3531 Nonexudative age-related macular degeneration: Secondary | ICD-10-CM | POA: Diagnosis not present

## 2014-10-10 ENCOUNTER — Encounter: Payer: Self-pay | Admitting: Nurse Practitioner

## 2014-11-21 DIAGNOSIS — H3531 Nonexudative age-related macular degeneration: Secondary | ICD-10-CM | POA: Diagnosis not present

## 2014-12-15 DIAGNOSIS — G35 Multiple sclerosis: Secondary | ICD-10-CM | POA: Diagnosis not present

## 2014-12-21 DIAGNOSIS — H3531 Nonexudative age-related macular degeneration: Secondary | ICD-10-CM | POA: Diagnosis not present

## 2015-01-20 DIAGNOSIS — H3531 Nonexudative age-related macular degeneration: Secondary | ICD-10-CM | POA: Diagnosis not present

## 2015-02-07 DIAGNOSIS — B349 Viral infection, unspecified: Secondary | ICD-10-CM | POA: Diagnosis not present

## 2015-02-07 DIAGNOSIS — E559 Vitamin D deficiency, unspecified: Secondary | ICD-10-CM | POA: Diagnosis not present

## 2015-02-07 DIAGNOSIS — E039 Hypothyroidism, unspecified: Secondary | ICD-10-CM | POA: Diagnosis not present

## 2015-02-07 DIAGNOSIS — E871 Hypo-osmolality and hyponatremia: Secondary | ICD-10-CM | POA: Diagnosis not present

## 2015-02-12 IMAGING — CR DG CHEST 2V
2 series · 2 of 2 positions shown · non-contrast
Comparison: Chest x-ray of 04/17/2010 and MR Islam Sunni of 10/30/2013

CLINICAL DATA: Right lung nodule on MR

EXAM:
CHEST  2 VIEW

[view not recorded (1 of 2)]
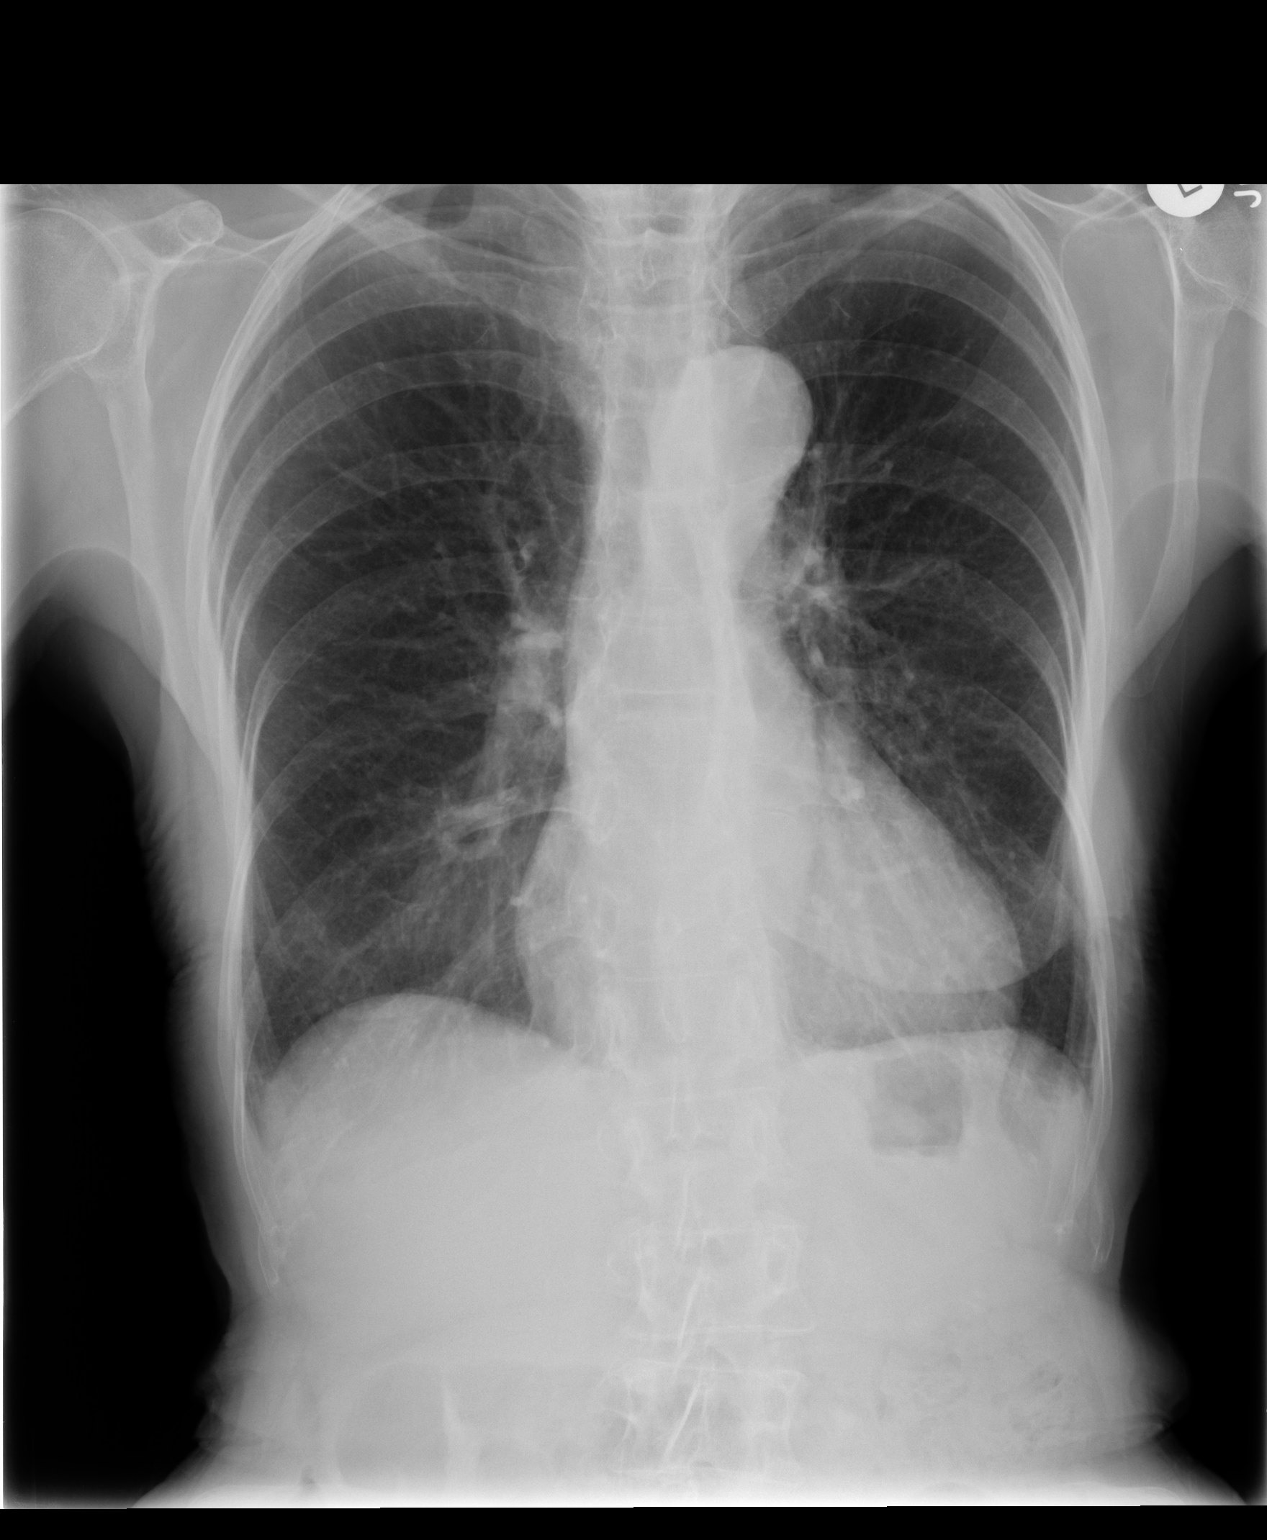

[view not recorded (2 of 2)]
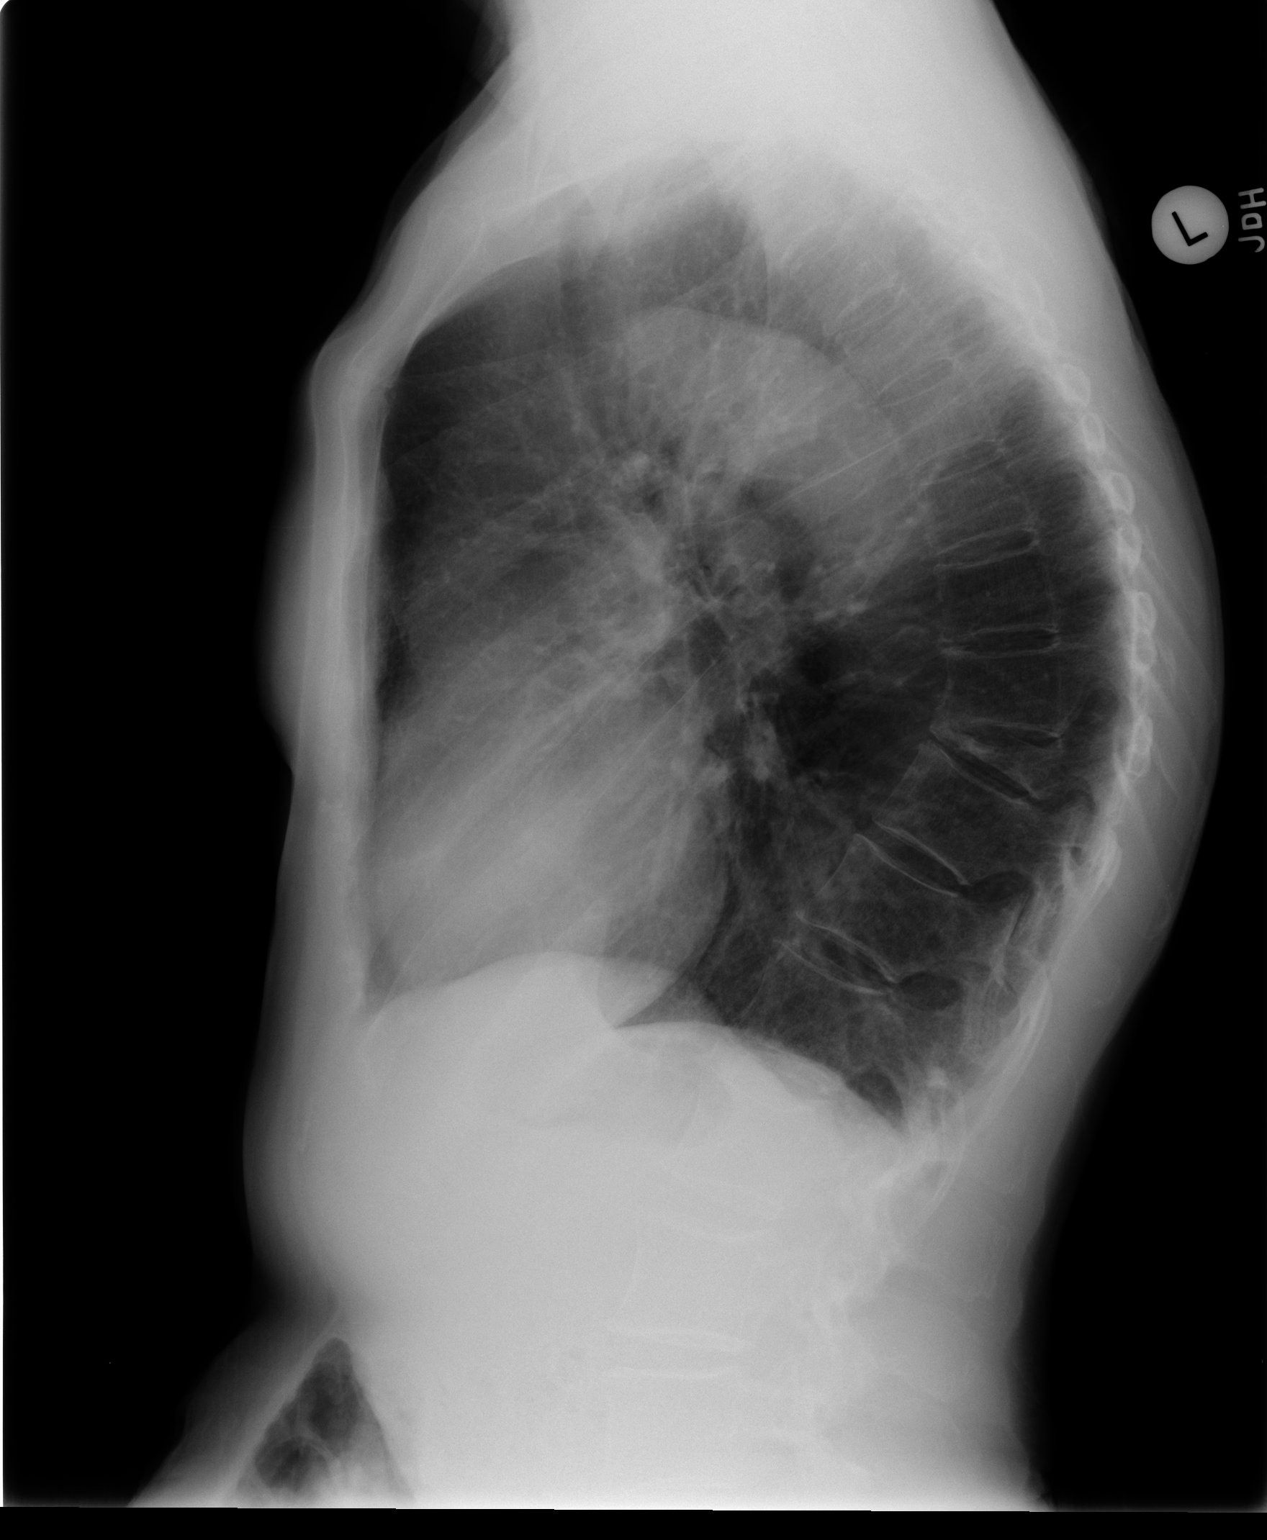

[2 of 2 positions shown; findings below may reference images not displayed]

FINDINGS: The lungs are hyperaerated consistent with emphysema. No lung nodule
is evident by chest x-ray. No infiltrate or effusion is seen.
Cardiomegaly is stable. The bones are osteopenic and a compressed
mid lower thoracic vertebral body appears stable.
IMPRESSION: Emphysema.  No lung nodule is evident by chest x-ray.

## 2015-02-19 DIAGNOSIS — H3531 Nonexudative age-related macular degeneration: Secondary | ICD-10-CM | POA: Diagnosis not present

## 2015-03-21 DIAGNOSIS — H3531 Nonexudative age-related macular degeneration: Secondary | ICD-10-CM | POA: Diagnosis not present

## 2015-04-17 DIAGNOSIS — R0989 Other specified symptoms and signs involving the circulatory and respiratory systems: Secondary | ICD-10-CM | POA: Diagnosis not present

## 2015-04-17 DIAGNOSIS — G35 Multiple sclerosis: Secondary | ICD-10-CM | POA: Diagnosis not present

## 2015-04-18 ENCOUNTER — Other Ambulatory Visit: Payer: Self-pay | Admitting: Neurology

## 2015-04-18 DIAGNOSIS — I720 Aneurysm of carotid artery: Secondary | ICD-10-CM

## 2015-04-20 DIAGNOSIS — H3531 Nonexudative age-related macular degeneration: Secondary | ICD-10-CM | POA: Diagnosis not present

## 2015-04-21 ENCOUNTER — Ambulatory Visit
Admission: RE | Admit: 2015-04-21 | Discharge: 2015-04-21 | Disposition: A | Discharge status: Home / Self Care | Payer: Medicare Other | Source: Ambulatory Visit | Attending: Neurology | Admitting: Neurology

## 2015-04-21 DIAGNOSIS — I720 Aneurysm of carotid artery: Secondary | ICD-10-CM

## 2015-04-21 DIAGNOSIS — Z1231 Encounter for screening mammogram for malignant neoplasm of breast: Secondary | ICD-10-CM | POA: Diagnosis not present

## 2015-04-21 DIAGNOSIS — Z803 Family history of malignant neoplasm of breast: Secondary | ICD-10-CM | POA: Diagnosis not present

## 2015-04-21 DIAGNOSIS — I6521 Occlusion and stenosis of right carotid artery: Secondary | ICD-10-CM | POA: Diagnosis not present

## 2015-04-21 DIAGNOSIS — I771 Stricture of artery: Secondary | ICD-10-CM | POA: Diagnosis not present

## 2015-05-04 ENCOUNTER — Ambulatory Visit (INDEPENDENT_AMBULATORY_CARE_PROVIDER_SITE_OTHER): Payer: Medicare Other | Admitting: Nurse Practitioner

## 2015-05-04 ENCOUNTER — Ambulatory Visit: Payer: Medicare Other | Admitting: Nurse Practitioner

## 2015-05-04 ENCOUNTER — Encounter: Payer: Self-pay | Admitting: Nurse Practitioner

## 2015-05-04 VITALS — BP 134/76 | HR 60 | Ht 65.25 in | Wt 129.0 lb

## 2015-05-04 DIAGNOSIS — G35 Multiple sclerosis: Secondary | ICD-10-CM | POA: Diagnosis not present

## 2015-05-04 DIAGNOSIS — Z01419 Encounter for gynecological examination (general) (routine) without abnormal findings: Secondary | ICD-10-CM | POA: Diagnosis not present

## 2015-05-04 DIAGNOSIS — Z0000 Encounter for general adult medical examination without abnormal findings: Secondary | ICD-10-CM

## 2015-05-04 DIAGNOSIS — N952 Postmenopausal atrophic vaginitis: Secondary | ICD-10-CM | POA: Diagnosis not present

## 2015-05-04 NOTE — Progress Notes (Signed)
Patient ID: [name], female   DOB: 27-Dec-1931, 81 y.o.   MRN: [id number] 81 y.o. G2P2 Widowed  Caucasian Fe here for annual exam.  She has been emotional recently as this is the anniversary of husbands death.  She continues to live here in [place] as she gave the family home back to his children. Her diagnosis of MS has been troubling to her as she finds it harder to get around.  She does use a cane for support.   Patient's last menstrual period was 09/08/1970 (approximate).          Sexually active: No.  The current method of family planning is abstinence and status post hysterectomy.    Exercising: Yes.    walk in pool twice weekly, exercise legs in gym at [facility] Smoker:  no  Health Maintenance: Pap:  06/12/00, normal MMG:  04/21/15, Bi-Rads 2:  Benign Colonoscopy:  10/2011, normal, repeat in 10 years BMD:   01/13/14  T Score -1.1 S/-2.7 R/-2.5 L/-1.Radius TDaP:  2006 Shingles:  Fall 2015 Prevnar 13: fall 2015 Labs:  PCP   reports that she has never smoked. She has never used smokeless tobacco. She reports that she drinks about 3.6 oz of alcohol per week. She reports that she does not use illicit drugs.  Past Medical History  Diagnosis Date  . Post-menopausal   . Osteoporosis     osteopenia  . Multiple sclerosis 2015    Past Surgical History  Procedure Laterality Date  . Colonoscopy  10/2011  . Breast ductectomy Right 05/1995  . Abdominal hysterectomy  1971    TVH    Current Outpatient Prescriptions  Medication Sig Dispense Refill  . Ascorbic Acid (VITAMIN C) 100 MG tablet Take 100 mg by mouth daily.    Marland Kitchen azelastine (ASTELIN) 137 MCG/SPRAY nasal spray Place 1 spray into the nose 2 (two) times daily. Use in each nostril as directed    . Biotin 1 MG CAPS Take by mouth daily.    . Calcium Carbonate-Vitamin D (CALCIUM + D PO) Take by mouth daily.    . fluocinonide cream (LIDEX) 3.29 % 5.18 application.    Marland Kitchen glucosamine-chondroitin 500-400 MG tablet Take 1 tablet by mouth 3  (three) times daily.    Marland Kitchen levothyroxine (SYNTHROID, LEVOTHROID) 25 MCG tablet Take 25 mcg by mouth daily.    . Magnesium 500 MG TABS Take 1 tablet by mouth daily.    . Vitamin D, Ergocalciferol, (DRISDOL) 50000 UNITS CAPS capsule Take 1 capsule (50,000 Units total) by mouth every 7 (seven) days. 30 capsule 3  . vitamin E 100 UNIT capsule Take 100 Units by mouth daily.     No current facility-administered medications for this visit.    Family History  Problem Relation Age of Onset  . Osteoarthritis Father     ROS:  Pertinent items are noted in HPI.  Otherwise, a comprehensive ROS was negative.  Exam:   BP 134/76 mmHg  Pulse 60  Ht 5' 5.25" (1.657 m)  Wt 129 lb (58.514 kg)  BMI 21.31 kg/m2  LMP 09/08/1970 (Approximate) Height: 5' 5.25" (165.7 cm) Ht Readings from Last 3 Encounters:  05/04/15 5' 5.25" (1.657 m)  04/29/14 5' 5.25" (1.657 m)  04/19/13 5' 5.5" (1.664 m)    General appearance: alert, cooperative and appears stated age Head: Normocephalic, without obvious abnormality, atraumatic Neck: no adenopathy, supple, symmetrical, trachea midline and thyroid normal to inspection and palpation Lungs: clear to auscultation bilaterally Breasts: normal appearance, no masses  or tenderness Heart: regular rate and rhythm Abdomen: soft, non-tender; no masses,  no organomegaly Extremities: extremities normal, atraumatic, no cyanosis or edema Skin: Skin color, texture, turgor normal. No rashes or lesions Lymph nodes: Cervical, supraclavicular, and axillary nodes normal. No abnormal inguinal nodes palpated Neurologic: Grossly normal   Pelvic: External genitalia:  no lesions              Urethra:  normal appearing urethra with no masses, tenderness or lesions              Bartholin's and Skene's: normal                 Vagina: normal appearing vagina with normal color and discharge, no lesions              Cervix: absent              Pap taken: Yes.   Bimanual Exam:  Uterus:   uterus absent              Adnexa: no mass, fullness, tenderness               Rectovaginal: Confirms               Anus:  normal sphincter tone, no lesions  Chaperone present: yes  A:  Well Woman with normal exam  Postmenopausal off ERT this past year  Atrophic vaginitis off Vagifem this past year S/P TVH secondary to scar tissue 1971 History of Vit D deficiency, hypothyroid Osteoporosis followed by Dr. Chalmers Cater (past forteo use) Grief reaction  History of MS   P:   Reviewed health and wellness pertinent to exam  Pap smear as above  Mammogram is due 04/2016  Counseled on breast self exam, mammography screening, adequate intake of calcium and vitamin D, diet and exercise return annually or prn  An After Visit Summary was printed and given to the patient.

## 2015-05-04 NOTE — Patient Instructions (Signed)

## 2015-05-07 NOTE — Progress Notes (Signed)
Encounter reviewed by Dr. Mackson Botz Silva.  

## 2015-05-09 LAB — IPS PAP SMEAR ONLY

## 2015-05-20 DIAGNOSIS — H3531 Nonexudative age-related macular degeneration: Secondary | ICD-10-CM | POA: Diagnosis not present

## 2015-06-19 DIAGNOSIS — H3531 Nonexudative age-related macular degeneration: Secondary | ICD-10-CM | POA: Diagnosis not present

## 2015-07-19 DIAGNOSIS — H3531 Nonexudative age-related macular degeneration: Secondary | ICD-10-CM | POA: Diagnosis not present

## 2015-08-18 DIAGNOSIS — H3531 Nonexudative age-related macular degeneration: Secondary | ICD-10-CM | POA: Diagnosis not present

## 2015-08-23 DIAGNOSIS — R609 Edema, unspecified: Secondary | ICD-10-CM | POA: Diagnosis not present

## 2015-08-23 DIAGNOSIS — G35 Multiple sclerosis: Secondary | ICD-10-CM | POA: Diagnosis not present

## 2015-08-23 DIAGNOSIS — Z23 Encounter for immunization: Secondary | ICD-10-CM | POA: Diagnosis not present

## 2015-08-23 DIAGNOSIS — M810 Age-related osteoporosis without current pathological fracture: Secondary | ICD-10-CM | POA: Diagnosis not present

## 2015-08-23 DIAGNOSIS — E039 Hypothyroidism, unspecified: Secondary | ICD-10-CM | POA: Diagnosis not present

## 2015-08-23 DIAGNOSIS — Z1389 Encounter for screening for other disorder: Secondary | ICD-10-CM | POA: Diagnosis not present

## 2015-08-23 DIAGNOSIS — E559 Vitamin D deficiency, unspecified: Secondary | ICD-10-CM | POA: Diagnosis not present

## 2015-08-23 DIAGNOSIS — E871 Hypo-osmolality and hyponatremia: Secondary | ICD-10-CM | POA: Diagnosis not present

## 2015-08-30 DIAGNOSIS — G35 Multiple sclerosis: Secondary | ICD-10-CM | POA: Diagnosis not present

## 2015-08-31 ENCOUNTER — Other Ambulatory Visit: Payer: Self-pay | Admitting: Neurology

## 2015-08-31 DIAGNOSIS — R0989 Other specified symptoms and signs involving the circulatory and respiratory systems: Secondary | ICD-10-CM

## 2015-09-06 ENCOUNTER — Other Ambulatory Visit: Payer: Medicare Other

## 2015-09-08 ENCOUNTER — Ambulatory Visit
Admission: RE | Admit: 2015-09-08 | Discharge: 2015-09-08 | Disposition: A | Discharge status: Home / Self Care | Payer: Medicare Other | Source: Ambulatory Visit | Attending: Neurology | Admitting: Neurology

## 2015-09-08 DIAGNOSIS — R0989 Other specified symptoms and signs involving the circulatory and respiratory systems: Secondary | ICD-10-CM

## 2015-09-08 MED ORDER — IOPAMIDOL (ISOVUE-370) INJECTION 76%
80.0000 mL | Freq: Once | INTRAVENOUS | Status: AC | PRN
  Administered 2015-09-08: 80 mL via INTRAVENOUS

## 2015-09-17 DIAGNOSIS — H353132 Nonexudative age-related macular degeneration, bilateral, intermediate dry stage: Secondary | ICD-10-CM | POA: Diagnosis not present

## 2015-10-19 DIAGNOSIS — Z961 Presence of intraocular lens: Secondary | ICD-10-CM | POA: Diagnosis not present

## 2015-10-19 DIAGNOSIS — H353111 Nonexudative age-related macular degeneration, right eye, early dry stage: Secondary | ICD-10-CM | POA: Diagnosis not present

## 2015-11-13 ENCOUNTER — Telehealth: Payer: Self-pay | Admitting: Nurse Practitioner

## 2015-11-13 NOTE — Telephone Encounter (Signed)
Patient returning call.

## 2015-11-13 NOTE — Telephone Encounter (Signed)
Left message to call Kaitlyn at [phone]. 

## 2015-11-13 NOTE — Telephone Encounter (Signed)
Spoke with patient. Patient states that she has ongoing urinary incontinence that she has to wear a pad for daily. Patient's granddaughter is getting married in two weeks. Patient would like to start on incontinence medication to help get her through the wedding. "I have been helping a lot and I will be there all day the day of the wedding. I need something to help." Advised I will speak with Kem Boroughs, FNP regarding her recommendations and return call. Patient is agreeable.

## 2015-11-13 NOTE — Telephone Encounter (Signed)
Patient calling requesting to speak with the nurse about medications for incontinence.

## 2015-11-14 NOTE — Telephone Encounter (Signed)
Patient states she is going to grand daughters wedding in [place] on 11/25/15.  She has MS and I am concerned about potential side effects of oral anticholinergic medications for her.  Suggestion is to try OTC Oxytrol patch and not leave it on for 4 days but for only 2 days.  She could try this prior to the wedding and see if helpful.  Then the day before the wedding apply a new patch and again leave on for only 2 days.  She will check with he neurologist to make sure he is also comfortable with this option.  She will continue with pad wear as needed.

## 2015-11-17 ENCOUNTER — Telehealth: Payer: Self-pay | Admitting: Nurse Practitioner

## 2015-11-17 NOTE — Telephone Encounter (Signed)
Patient would like to speak with nurse regarding a medication.

## 2015-11-17 NOTE — Telephone Encounter (Signed)
Spoke with patient. Patient states that she went to [facility] to pick up Oxytrol patches as recommended by Kem Boroughs, FNP and they do not carry the patches. Please see telephone note from 11/13/2015. Advised patient I will call to local pharmacies, [facility], and [facility] in the area to see where these may be in stock for her.  [facility] [address]-[facility] not [facility] Ox[facility]rol Call to [facility] [address] [facility] not carry Ox[facility]rol Call to [facility] [address] hold for 10 minutes with no help Call to [facility] off [address] do carry Oxytrol and is available for pick up in store,  Call to patient. Advised Oxytrol patches are available at [facility] off [address] for pickup in store. Patient is agreeable.  Routing to provider for final review. Patient agreeable to disposition. Will close encounter.

## 2015-11-23 DIAGNOSIS — H353132 Nonexudative age-related macular degeneration, bilateral, intermediate dry stage: Secondary | ICD-10-CM | POA: Diagnosis not present

## 2015-11-29 DIAGNOSIS — H353132 Nonexudative age-related macular degeneration, bilateral, intermediate dry stage: Secondary | ICD-10-CM | POA: Diagnosis not present

## 2015-11-29 DIAGNOSIS — H47011 Ischemic optic neuropathy, right eye: Secondary | ICD-10-CM | POA: Diagnosis not present

## 2015-11-29 DIAGNOSIS — H43813 Vitreous degeneration, bilateral: Secondary | ICD-10-CM | POA: Diagnosis not present

## 2015-11-29 DIAGNOSIS — H35371 Puckering of macula, right eye: Secondary | ICD-10-CM | POA: Diagnosis not present

## 2015-12-16 DIAGNOSIS — H353132 Nonexudative age-related macular degeneration, bilateral, intermediate dry stage: Secondary | ICD-10-CM | POA: Diagnosis not present

## 2016-01-02 DIAGNOSIS — G35 Multiple sclerosis: Secondary | ICD-10-CM | POA: Diagnosis not present

## 2016-01-15 DIAGNOSIS — H353132 Nonexudative age-related macular degeneration, bilateral, intermediate dry stage: Secondary | ICD-10-CM | POA: Diagnosis not present

## 2016-02-12 ENCOUNTER — Ambulatory Visit (INDEPENDENT_AMBULATORY_CARE_PROVIDER_SITE_OTHER): Payer: Medicare Other | Admitting: Nurse Practitioner

## 2016-02-12 ENCOUNTER — Encounter: Payer: Self-pay | Admitting: Nurse Practitioner

## 2016-02-12 VITALS — BP 136/72 | HR 64 | Ht 65.25 in | Wt 128.0 lb

## 2016-02-12 DIAGNOSIS — N760 Acute vaginitis: Secondary | ICD-10-CM | POA: Diagnosis not present

## 2016-02-12 MED ORDER — FLUCONAZOLE 150 MG PO TABS: ORAL_TABLET | ORAL | Status: DC

## 2016-02-12 NOTE — Patient Instructions (Addendum)
Monilial Vaginitis Vaginitis in a soreness, swelling and redness (inflammation) of the vagina and vulva. Monilial vaginitis is not a sexually transmitted infection. CAUSES  Yeast vaginitis is caused by yeast (candida) that is normally found in your vagina. With a yeast infection, the candida has overgrown in number to a point that upsets the chemical balance. SYMPTOMS   White, thick vaginal discharge.  Swelling, itching, redness and irritation of the vagina and possibly the lips of the vagina (vulva).  Burning or painful urination.  Painful intercourse. DIAGNOSIS  Things that may contribute to monilial vaginitis are:  Postmenopausal and virginal states.  Pregnancy.  Infections.  Being tired, sick or stressed, especially if you had monilial vaginitis in the past.  Diabetes. Good control will help lower the chance.  Birth control pills.  Tight fitting garments.  Using bubble bath, feminine sprays, douches or deodorant tampons.  Taking certain medications that kill germs (antibiotics).  Sporadic recurrence can occur if you become ill. TREATMENT  Your caregiver will give you medication.  There are several kinds of anti monilial vaginal creams and suppositories specific for monilial vaginitis. For recurrent yeast infections, use a suppository or cream in the vagina 2 times a week, or as directed.  Anti-monilial or steroid cream for the itching or irritation of the vulva may also be used. Get your caregiver's permission.  Painting the vagina with methylene blue solution may help if the monilial cream does not work.  Eating yogurt may help prevent monilial vaginitis. HOME CARE INSTRUCTIONS   Finish all medication as prescribed.  Do not have sex until treatment is completed or after your caregiver tells you it is okay.  Take warm sitz baths.  Do not douche.  Do not use tampons, especially scented ones.  Wear cotton underwear.  Avoid tight pants and panty  hose.  Tell your sexual partner that you have a yeast infection. They should go to their caregiver if they have symptoms such as mild rash or itching.  Your sexual partner should be treated as well if your infection is difficult to eliminate.  Practice safer sex. Use condoms.  Some vaginal medications cause latex condoms to fail. Vaginal medications that harm condoms are:  Cleocin cream.  Butoconazole (Femstat).  Terconazole (Terazol) vaginal suppository.  Miconazole (Monistat) (may be purchased over the counter). SEEK MEDICAL CARE IF:   You have a temperature by mouth above 102 F (38.9 C).  The infection is getting worse after 2 days of treatment.  The infection is not getting better after 3 days of treatment.  You develop blisters in or around your vagina.  You develop vaginal bleeding, and it is not your menstrual period.  You have pain when you urinate.  You develop intestinal problems.  You have pain with sexual intercourse.   This information is not intended to replace advice given to you by your health care provider. Make sure you discuss any questions you have with your health care provider.   Document Released: 09/04/2005 Document Revised: 02/17/2012 Document Reviewed: 05/29/2015 Elsevier Interactive Patient Education 2016 [place] grade of coconut oil

## 2016-02-12 NOTE — Progress Notes (Signed)
Patient ID: [name], female   DOB: 12-10-1931, 82 y.o.   MRN: [id number]  83 y.o. Widowed Caucasian female G2P2 here with complaint of vaginal symptoms of itching, burning, and increase discharge. Describes discharge as minimal and white in color. Onset of symptoms ongoing for about 2 months. Denies new personal products or vaginal dryness.  She has used OTC Monistat 3 & 7 day treatment which gives her temporary relief.  In the past one other time she had a chronic yeast that took a long term management to finally get rid of.  She has had symptoms that go in the anal area and such itching that she can barely stand it. She does wear pads and tries to keep them dry.  Since her diagnoses of MS she has limited mobility but tries to walk with a cane and keep busy.  She does suffer from depression a lot since the death of her husband.  Their anniversary is coming up.   O:  Healthy female WDWN Affect: normal, orientation x 3  Exam: alert and in no distress, requires some help to the table Abdomen: soft and non tender Lymph node: no enlargement or tenderness Pelvic exam: External genital: normal female with areas of redness and linear cuts consistent with chronic yeast.  No signs of LSA. BUS: negative Vagina: minimal discharge noted.  Affirm taken. Adnexa:normal, non tender, no masses or fullness noted    A: Vaginitis - acute and chronic yeast   P: Discussed findings of vaginitis and etiology. Discussed Aveeno or baking soda sitz bath for comfort. Avoid moist clothes or pads for extended period of time. If working out in gym clothes or swim suits for long periods of time change underwear or bottoms of swimsuit if possible. Olive Oil/Coconut Oil use for skin protection prior to activity can be used to external skin.  Rx: Diflucan 150 mg weekly X 4  Follow with Affirm  RV prn

## 2016-02-13 ENCOUNTER — Telehealth: Payer: Self-pay

## 2016-02-13 LAB — WET PREP BY MOLECULAR PROBE
Candida species: NEGATIVE
GARDNERELLA VAGINALIS: NEGATIVE
Trichomonas vaginosis: NEGATIVE

## 2016-02-13 MED ORDER — NYSTATIN 100000 UNIT/GM EX CREA: TOPICAL_CREAM | CUTANEOUS | Status: DC

## 2016-02-13 MED ORDER — TRIAMCINOLONE ACETONIDE 0.1 % EX CREA: TOPICAL_CREAM | CUTANEOUS | Status: DC

## 2016-02-13 NOTE — Telephone Encounter (Signed)
-----   Message from Kem Boroughs, [place] sent at 02/13/2016  7:57 AM EST ----- Please let pt know that wet prep showed no bacterial or yeast - but the amount of vaginal discharge was minimal.  She still has external yeast and would recommend continued treatment plan.  She may need topical cream to use prn.  Triamcinolone and nystatin can be given to apply bid.

## 2016-02-13 NOTE — Telephone Encounter (Signed)
Spoke with patient. Advised of message as seen below from Kem Boroughs, [employer]. She is agreeable and verbalizes understanding. Patient would like to use external topical cream prn for symptoms. Rx for Triamcinolone cream .1 % apply topically as needed and Nystatin Cream apply topically as needed sent to pharmacy on file. She is agreeable.  Routing to provider for final review. Patient agreeable to disposition. Will close encounter.

## 2016-02-16 NOTE — Progress Notes (Signed)
Encounter reviewed by Dr. Hollie Wojahn Amundson C. Silva.  

## 2016-02-20 DIAGNOSIS — G35 Multiple sclerosis: Secondary | ICD-10-CM | POA: Diagnosis not present

## 2016-02-20 DIAGNOSIS — E039 Hypothyroidism, unspecified: Secondary | ICD-10-CM | POA: Diagnosis not present

## 2016-02-20 DIAGNOSIS — E871 Hypo-osmolality and hyponatremia: Secondary | ICD-10-CM | POA: Diagnosis not present

## 2016-02-20 DIAGNOSIS — R609 Edema, unspecified: Secondary | ICD-10-CM | POA: Diagnosis not present

## 2016-02-20 DIAGNOSIS — E559 Vitamin D deficiency, unspecified: Secondary | ICD-10-CM | POA: Diagnosis not present

## 2016-02-20 DIAGNOSIS — M810 Age-related osteoporosis without current pathological fracture: Secondary | ICD-10-CM | POA: Diagnosis not present

## 2016-02-20 DIAGNOSIS — M79676 Pain in unspecified toe(s): Secondary | ICD-10-CM | POA: Diagnosis not present

## 2016-02-21 DIAGNOSIS — M8589 Other specified disorders of bone density and structure, multiple sites: Secondary | ICD-10-CM | POA: Diagnosis not present

## 2016-02-21 DIAGNOSIS — M810 Age-related osteoporosis without current pathological fracture: Secondary | ICD-10-CM | POA: Diagnosis not present

## 2016-02-23 DIAGNOSIS — M810 Age-related osteoporosis without current pathological fracture: Secondary | ICD-10-CM | POA: Diagnosis not present

## 2016-02-23 DIAGNOSIS — E559 Vitamin D deficiency, unspecified: Secondary | ICD-10-CM | POA: Diagnosis not present

## 2016-02-23 DIAGNOSIS — E039 Hypothyroidism, unspecified: Secondary | ICD-10-CM | POA: Diagnosis not present

## 2016-02-26 ENCOUNTER — Telehealth: Payer: Self-pay | Admitting: Nurse Practitioner

## 2016-02-26 NOTE — Telephone Encounter (Signed)
Please let patient know that BMD done on 02/21/16 still shows osteoporosis at both hips.  Right greater than left.  Comparison to previous exam 01/13/14 there was slight change in right hip and -13% change on the left, spine -12% change.  The radius was used this time and score was normal.  She desires not to be on medication but will need to discuss that with Dr. Chalmers Cater.

## 2016-03-05 DIAGNOSIS — M810 Age-related osteoporosis without current pathological fracture: Secondary | ICD-10-CM | POA: Diagnosis not present

## 2016-03-15 DIAGNOSIS — H353132 Nonexudative age-related macular degeneration, bilateral, intermediate dry stage: Secondary | ICD-10-CM | POA: Diagnosis not present

## 2016-03-19 ENCOUNTER — Encounter: Payer: Self-pay | Admitting: Podiatry

## 2016-03-19 ENCOUNTER — Ambulatory Visit (INDEPENDENT_AMBULATORY_CARE_PROVIDER_SITE_OTHER): Payer: Medicare Other | Admitting: Podiatry

## 2016-03-19 VITALS — BP 127/64 | HR 60 | Resp 12

## 2016-03-19 DIAGNOSIS — R269 Unspecified abnormalities of gait and mobility: Secondary | ICD-10-CM

## 2016-03-19 NOTE — Progress Notes (Signed)
   Subjective:    Patient ID: [name], female    DOB: [birth year], 82 y.o.   MRN: [id number]  HPI    This patient presents today complaining of generalized discomfort in the distal toes 2-4 bilaterally when walking wearing shoes. For the past 15 years with gradual progression of the symptoms. The symptoms are associated with patient's MS an altered gait. Patient currently wears sandals with some additional foam pads placed of the insole of the sandals to reduce friction rub in the distal toe areas on the right and left feet which does reduce some of the discomfort. Patient describes visiting multiple podiatrist and surgical doctors for this problem. Patient does trim the lesions herself with a grinder home device. Patient is also requesting a note for her jam to wear open toe shoes.  Review of Systems  Cardiovascular: Positive for leg swelling.  Musculoskeletal: Positive for myalgias, joint swelling and gait problem.  All other systems reviewed and are negative.      Objective:   Physical Exam  Orientated 3  Vascular: Pitting edema left DP and PT pulses 1/4 bilaterally Capillary reflex within normal limits bilaterally  Neurological: Sensation to 10 g monofilament wire intact 3/5 bilaterally Vibratory sensation reactive bilaterally Ankle reflex weakly reactive bilaterally  Dermatological: No open skin lesions bilaterally Distal toes 2 and 3 after reactive keratoses with some bleeding within the callus bilaterally  Musculoskeletal: Spastic left lower extremity with left foot in a varus position Plantar flexion right 5/5 right and 3/5 left Dorsi flexion right 5/5 and 4/5 left Patient has an unsteady gait pattern and constant flexion of the toes during gait. She uses a cane for stability      Assessment & Plan:   Assessment: Gait disturbance associated with MS Spastic left lower extremity with muscle weakness left lower extremity Reactive callus distal toes  right and left associated with patient's attempt to flex toes for stability walking  Plan: Today I reviewed the results of examination with patient today and advised her that at this time I thought that her toes were uncomfortable because of constant clawing of the toes during the gait cycle. There was some reactive callus in the distal toes right and left which I recommended gentle smoothing as needed. Placing some additional soft foam in the sole of the shoes to reduce friction rub is a good idea and I would recommend she continue to do so Okay to gently smoothly calluses as needed Issued note for patient to take to gym to wear open toe shoes  Reappoint at patient's request

## 2016-03-19 NOTE — Patient Instructions (Addendum)
Today your examination revealed reactive calluses and the ends of the second and third toes right and left feet associated with your gait disturbance secondary to MS. I recommend the gently sand  the calluses at the end the second third toes with a pumice stone and relieve some slight residual callus Okay to wear open toe shoes to gym

## 2016-04-03 NOTE — Telephone Encounter (Signed)
I have attempted to contact this patient by phone with the following results: left message to return call to [place] at [phone] on answering machine. [phone] (Home)

## 2016-04-04 ENCOUNTER — Telehealth: Payer: Self-pay | Admitting: Nurse Practitioner

## 2016-04-04 NOTE — Telephone Encounter (Signed)
Patient is returning a call to [place]. Pt is aware Colletta [place] is not here today.

## 2016-04-04 NOTE — Telephone Encounter (Signed)
Graylon Good, CMA at 04/03/2016 11:57 AM     Status: Signed       Expand All Collapse All   I have attempted to contact this patient by phone with the following results: left message to return call to [place] at [phone] on answering machine. [phone] (Home)             Kem Boroughs, FNP at 02/26/2016 4:00 PM     Status: Signed       Expand All Collapse All   Please let patient know that BMD done on 02/21/16 still shows osteoporosis at both hips. Right greater than left. Comparison to previous exam 01/13/14 there was slight change in right hip and -13% change on the left, spine -12% change. The radius was used this time and score was normal. She desires not to be on medication but will need to discuss that with Dr. Chalmers Cater.

## 2016-04-05 NOTE — Telephone Encounter (Signed)
Spoke with patient. Advised of results as seen below from Kem Boroughs, [place]. She is agreeable and verbalizes understanding. Reports she has discussed her results with Dr.Balan and has already had her first reclast infusion. Reports she did well with her reclast and denies any side effects. Advised I will let Kem Boroughs, FNP know. She is agreeable and verbalizes understanding.  Routing to provider for final review. Patient agreeable to disposition. Will close encounter.

## 2016-04-09 NOTE — Telephone Encounter (Signed)
Pt notified in phone note dated 04/04/16.

## 2016-04-26 DIAGNOSIS — Z1231 Encounter for screening mammogram for malignant neoplasm of breast: Secondary | ICD-10-CM | POA: Diagnosis not present

## 2016-05-01 DIAGNOSIS — G35 Multiple sclerosis: Secondary | ICD-10-CM | POA: Diagnosis not present

## 2016-05-01 DIAGNOSIS — R32 Unspecified urinary incontinence: Secondary | ICD-10-CM | POA: Diagnosis not present

## 2016-05-10 ENCOUNTER — Encounter: Payer: Self-pay | Admitting: Nurse Practitioner

## 2016-05-10 ENCOUNTER — Ambulatory Visit (INDEPENDENT_AMBULATORY_CARE_PROVIDER_SITE_OTHER): Payer: Medicare Other | Admitting: Nurse Practitioner

## 2016-05-10 VITALS — BP 140/70 | HR 64 | Resp 18 | Ht 65.25 in | Wt 128.0 lb

## 2016-05-10 DIAGNOSIS — Z01419 Encounter for gynecological examination (general) (routine) without abnormal findings: Secondary | ICD-10-CM | POA: Diagnosis not present

## 2016-05-10 DIAGNOSIS — G35 Multiple sclerosis: Secondary | ICD-10-CM | POA: Diagnosis not present

## 2016-05-10 DIAGNOSIS — Z0000 Encounter for general adult medical examination without abnormal findings: Secondary | ICD-10-CM | POA: Diagnosis not present

## 2016-05-10 DIAGNOSIS — Z124 Encounter for screening for malignant neoplasm of cervix: Secondary | ICD-10-CM | POA: Diagnosis not present

## 2016-05-10 NOTE — Progress Notes (Signed)
82 y.o. G2P2 Widowed  Caucasian Fe here for annual exam.  No new health problems.  MS problems that is affecting her muscle coordination.  Worse always later in the day when she is tired. Uses a cane for ambulation.  She did have a recent yeast infection that has now resolved.   Patient's last menstrual period was 09/08/1970 (approximate).          Sexually active: No.  The current method of family planning is status post hysterectomy.    Exercising: Yes.    Machines, leg exercising Smoker:  no  Health Maintenance: Pap:  05/04/15 Neg MMG:  04/26/16 BIRADS1:neg Colonoscopy:  10/2011 Normal - repeat 10 years  BMD:   02/21/16 Osteoporosis TDaP:  Unsure Shingles: 2015 Pneumonia: 2015 Hep C and HIV: not indicated Labs: PCP   reports that she has never smoked. She has never used smokeless tobacco. She reports that she drinks about 3.6 oz of alcohol per week. She reports that she does not use illicit drugs.  Past Medical History  Diagnosis Date  . Post-menopausal   . Osteoporosis     osteopenia  . Multiple sclerosis ([place]) 2015  . Incontinent of urine     Past Surgical History  Procedure Laterality Date  . Colonoscopy  10/2011  . Breast ductectomy Right 05/1995  . Abdominal hysterectomy  1971    TVH    Current Outpatient Prescriptions  Medication Sig Dispense Refill  . Ascorbic Acid (VITAMIN C) 100 MG tablet Take 100 mg by mouth daily.    Marland Kitchen azelastine (ASTELIN) 137 MCG/SPRAY nasal spray Place 1 spray into the nose 2 (two) times daily. Use in each nostril as directed    . Biotin 1 MG CAPS Take by mouth daily.    . Calcium Carbonate-Vitamin D (CALCIUM + D PO) Take by mouth daily.    Marland Kitchen glucosamine-chondroitin 500-400 MG tablet Take 1 tablet by mouth 3 (three) times daily.    Marland Kitchen levothyroxine (SYNTHROID, LEVOTHROID) 25 MCG tablet Take 25 mcg by mouth daily.    . Magnesium 500 MG TABS Take 1 tablet by mouth daily.    Marland Kitchen nystatin cream (MYCOSTATIN) Apply topically as needed. 30 g 0  .  triamcinolone cream (KENALOG) 0.1 % Apply topically as needed. 30 g 0  . Vitamin D, Ergocalciferol, (DRISDOL) 50000 UNITS CAPS capsule Take 1 capsule (50,000 Units total) by mouth every 7 (seven) days. 30 capsule 3  . vitamin E 100 UNIT capsule Take 100 Units by mouth daily.    . zoledronic acid (RECLAST) 5 MG/100ML SOLN injection Inject 5 mg into the vein once.     No current facility-administered medications for this visit.    Family History  Problem Relation Age of Onset  . Osteoarthritis Father   . Stroke Father   . Heart failure Mother   . Hypertension Mother     ROS:  Pertinent items are noted in HPI.  Otherwise, a comprehensive ROS was negative.  Exam:   BP 140/70 mmHg  Pulse 64  Resp 18  Ht 5' 5.25" (1.657 m)  Wt 128 lb (58.06 kg)  BMI 21.15 kg/m2  LMP 09/08/1970 (Approximate) Height: 5' 5.25" (165.7 cm) Ht Readings from Last 3 Encounters:  05/10/16 5' 5.25" (1.657 m)  02/12/16 5' 5.25" (1.657 m)  05/04/15 5' 5.25" (1.657 m)    General appearance: alert, cooperative and appears stated age Head: Normocephalic, without obvious abnormality, atraumatic Neck: no adenopathy, supple, symmetrical, trachea midline and thyroid normal to inspection and  palpation Lungs: clear to auscultation bilaterally Breasts: normal appearance, no masses or tenderness Heart: regular rate and rhythm Abdomen: soft, non-tender; no masses,  no organomegaly Extremities: extremities normal, atraumatic, no cyanosis or edema Skin: Skin color, texture, turgor normal. No rashes or lesions Lymph nodes: Cervical, supraclavicular, and axillary nodes normal. No abnormal inguinal nodes palpated Neurologic: Grossly normal   Pelvic: External genitalia:  no lesions              Urethra:  normal appearing urethra with no masses, tenderness or lesions              Bartholin's and Skene's: normal                 Vagina: normal appearing vagina with normal color and discharge, no lesions               Cervix: absent              Pap taken: No. Bimanual Exam:  Uterus:  uterus absent              Adnexa: no mass, fullness, tenderness               Rectovaginal: Confirms               Anus:  normal sphincter tone, no lesions with grade I rectocele  Chaperone present: no  A:  Well Woman with normal exam  Postmenopausal off ERT X 2 yrs Atrophic vaginitis off Vagifem X 2 yrs S/P TVH secondary to scar tissue 1971 History of Vit D deficiency, hypothyroid Osteoporosis followed by Dr. Chalmers Cater (past forteo use and now on Reclast) Grief reaction always worse with anniversary History of MS    P:   Reviewed health and wellness pertinent to exam  Pap smear as above  Mammogram is due 5/18  Counseled on breast self exam, mammography screening, adequate intake of calcium and vitamin D, diet and exercise return annually or prn  An After Visit Summary was printed and given to the patient.

## 2016-05-10 NOTE — Patient Instructions (Addendum)

## 2016-05-14 DIAGNOSIS — H353132 Nonexudative age-related macular degeneration, bilateral, intermediate dry stage: Secondary | ICD-10-CM | POA: Diagnosis not present

## 2016-05-15 DIAGNOSIS — H353132 Nonexudative age-related macular degeneration, bilateral, intermediate dry stage: Secondary | ICD-10-CM | POA: Diagnosis not present

## 2016-05-17 NOTE — Progress Notes (Signed)
Encounter reviewed Alechia Lezama, MD   

## 2016-06-14 DIAGNOSIS — H353132 Nonexudative age-related macular degeneration, bilateral, intermediate dry stage: Secondary | ICD-10-CM | POA: Diagnosis not present

## 2016-07-13 DIAGNOSIS — H353132 Nonexudative age-related macular degeneration, bilateral, intermediate dry stage: Secondary | ICD-10-CM | POA: Diagnosis not present

## 2016-08-12 DIAGNOSIS — H353132 Nonexudative age-related macular degeneration, bilateral, intermediate dry stage: Secondary | ICD-10-CM | POA: Diagnosis not present

## 2016-08-23 DIAGNOSIS — R609 Edema, unspecified: Secondary | ICD-10-CM | POA: Diagnosis not present

## 2016-08-23 DIAGNOSIS — E039 Hypothyroidism, unspecified: Secondary | ICD-10-CM | POA: Diagnosis not present

## 2016-08-23 DIAGNOSIS — E559 Vitamin D deficiency, unspecified: Secondary | ICD-10-CM | POA: Diagnosis not present

## 2016-08-23 DIAGNOSIS — E871 Hypo-osmolality and hyponatremia: Secondary | ICD-10-CM | POA: Diagnosis not present

## 2016-08-23 DIAGNOSIS — G35 Multiple sclerosis: Secondary | ICD-10-CM | POA: Diagnosis not present

## 2016-08-23 DIAGNOSIS — Z23 Encounter for immunization: Secondary | ICD-10-CM | POA: Diagnosis not present

## 2016-08-23 DIAGNOSIS — M810 Age-related osteoporosis without current pathological fracture: Secondary | ICD-10-CM | POA: Diagnosis not present

## 2016-09-11 DIAGNOSIS — H353132 Nonexudative age-related macular degeneration, bilateral, intermediate dry stage: Secondary | ICD-10-CM | POA: Diagnosis not present

## 2016-09-25 DIAGNOSIS — G35 Multiple sclerosis: Secondary | ICD-10-CM | POA: Diagnosis not present

## 2016-09-27 ENCOUNTER — Ambulatory Visit (INDEPENDENT_AMBULATORY_CARE_PROVIDER_SITE_OTHER): Payer: Medicare Other | Admitting: Nurse Practitioner

## 2016-09-27 ENCOUNTER — Encounter (INDEPENDENT_AMBULATORY_CARE_PROVIDER_SITE_OTHER): Payer: Self-pay

## 2016-09-27 ENCOUNTER — Encounter: Payer: Self-pay | Admitting: Nurse Practitioner

## 2016-09-27 VITALS — BP 144/76 | HR 60 | Ht 65.25 in | Wt 131.0 lb

## 2016-09-27 DIAGNOSIS — K6289 Other specified diseases of anus and rectum: Secondary | ICD-10-CM

## 2016-09-27 DIAGNOSIS — N760 Acute vaginitis: Secondary | ICD-10-CM | POA: Diagnosis not present

## 2016-09-27 MED ORDER — NYSTATIN 100000 UNIT/GM EX CREA: TOPICAL_CREAM | CUTANEOUS | 4 refills | Status: DC

## 2016-09-27 MED ORDER — LIDOCAINE HCL 2 % EX GEL: CUTANEOUS | 0 refills | Status: DC

## 2016-09-27 MED ORDER — TRIAMCINOLONE ACETONIDE 0.1 % EX CREA: TOPICAL_CREAM | CUTANEOUS | 4 refills | Status: DC

## 2016-09-27 MED ORDER — FLUCONAZOLE 150 MG PO TABS: ORAL_TABLET | ORAL | 1 refills | Status: DC

## 2016-09-27 NOTE — Progress Notes (Signed)
Patient ID: [name], female   DOB: [birth year], 82 y.o.   MRN: [id number]  82 y.o. Widowed Caucasian female G2P2 here with complaint of vulvar and perianal symptoms of itching and irritation. Denies vaginal discharge.  She has used Monistat several times and does get relief but comes right back.  She has MS so has to wear a pad secondary to urinary and stool incontinence.  Thinks it is not from the pad use.  She also has noted the inner thighs are more pink and irritated - again does not think from pad or changes of skin products.  When she has a BM she is using more tissues than she thinks as normal to get clean and then has even more rectal irritation. Onset of symptoms on and off for several months. Denies new personal products or vaginal dryness.  No STD concerns, not SA. Urinary symptoms none . Contraception is menopausal.   O:  Healthy female WDWN Affect: normal, orientation x 3  Exam: no distress Abdomen: soft and non tender Lymph node: no enlargement or tenderness Pelvic exam: External genital: normal female with multiple areas of yeast along the inner thighs and perirectal.  Do not see any external hemorrhoids and digit exam is normal without mass.  She does have some hypopigmented areas at the clitoral hood but more consistent with atrophy than LSA. BUS: negative Vagina: no discharge noted.      A: Vaginitis  Topical yeast along the inner thighs   P: Discussed findings of vaginitis and etiology. Discussed Aveeno or baking soda sitz bath for comfort. Avoid moist clothes or pads for extended period of time. If working out in gym clothes or swim suits for long periods of time change underwear or bottoms of swimsuit if possible. Olive Oil/Coconut Oil use for skin protection prior to activity can be used to external skin.  Rx: Diflucan 150 mg X 2  Triamcinolone and Nystatin cream mixed together to apply to inner thighs and labia.  Then add a pea size amount of Lidocaine 2 %  to mixture for rectal area.  She is given potential precautions with use of Lidocaine -but will be using only prn for perirectal pain.  RV prn

## 2016-09-27 NOTE — Patient Instructions (Signed)
Use cream as directed for yeast around the labia and inner thighs.  Use the Lidocaine 2 % to the cream for the peri rectal area.  If symptoms not better to call back.

## 2016-09-29 NOTE — Progress Notes (Signed)
Encounter reviewed by Dr. Miya Luviano Amundson C. Silva.  

## 2016-10-07 ENCOUNTER — Telehealth: Payer: Self-pay | Admitting: Nurse Practitioner

## 2016-10-07 NOTE — Telephone Encounter (Signed)
Spoke with patient. Patient states she was in for OV 09/27/16. Patient states she had a "sever case of yeast". Patient reports completing Diflucan x2 and using Kenalog and Nystatin cream. Patient states vaginal itching and irritation has resolved, but still has rectal pain that is internal. Patient states pain is more uncomfortable with bowel movements. Patient states she hasn't been using lidocaine as she feels the pain is not external. Recommended OV for further evaluation. Patient scheduled for 10/08/16 at 10:15am with Kem Boroughs, NP. Patient agreeable to date and time.    Kem Boroughs, NP do you agree with recommendations?

## 2016-10-07 NOTE — Telephone Encounter (Signed)
Patient says her medication for the infection she was seen for is not helping.

## 2016-10-07 NOTE — Telephone Encounter (Signed)
Return call to patient. Left message to call back and ask for triage nurse.

## 2016-10-08 ENCOUNTER — Ambulatory Visit (INDEPENDENT_AMBULATORY_CARE_PROVIDER_SITE_OTHER): Payer: Medicare Other | Admitting: Nurse Practitioner

## 2016-10-08 ENCOUNTER — Encounter: Payer: Self-pay | Admitting: Nurse Practitioner

## 2016-10-08 VITALS — BP 138/76 | HR 78 | Temp 97.6°F | Resp 16 | Ht 65.25 in | Wt 127.0 lb

## 2016-10-08 DIAGNOSIS — K6289 Other specified diseases of anus and rectum: Secondary | ICD-10-CM

## 2016-10-08 NOTE — Progress Notes (Signed)
Encounter reviewed by Dr. Pranay Hilbun Amundson C. Silva.  

## 2016-10-08 NOTE — Patient Instructions (Signed)
If any concerns or questions to call back.

## 2016-10-08 NOTE — Telephone Encounter (Signed)
OK to close encounter? Patient seen today 10/08/16 for OV.

## 2016-10-08 NOTE — Progress Notes (Signed)
82 y.o. Widowed Caucasian female G2P2 here for follow up of rectal pain and irritation treated with Nystatin and Triamcinolone initiated on 09/27/16.  She was also going to use a spot of Lidocaine to rectal area for pain but did not get filled until yesterday.  She was concerned that yeast was still the cause and called to discuss.  Apt was made for a recheck.     O: Healthy WD,WN female Affect: no distress Abdomen:soft, non tender, normal bowel sounds Pelvic exam:EXTERNAL GENITALIA: normal appearing vulva with no masses, tenderness or lesions VAGINA: no abnormal discharge or lesions RECTUM: no masses or abnormalities, there is rectal irritation still consistent with external yeast.   Procedure:  Anal scope Exam:  Pt verbal consent for procedure. She is place in left lateral decubitus position.  K-Y jelly and a dap of Lidocaine is mixed.  The anal scope is inserted gently.  There is no evidence of a rectal fissure or internal hemorrhoid.  Right at the anal sphincter there is redness from the yeast infection.  Pt tolerated the procedure well.  A: Perianal yeast infection  Rectal irritation most likely from multiple wiping    P:  Discussed findings of no fissure.  Still would like for her to continue with Triamcinolone, Nystatin, and lidocaine small amount to the peri rectal area.  Keep area clean and dry.   Labs :  none  Instructions given regarding: mixture of the creams together.  RV

## 2016-10-11 DIAGNOSIS — H353132 Nonexudative age-related macular degeneration, bilateral, intermediate dry stage: Secondary | ICD-10-CM | POA: Diagnosis not present

## 2016-10-15 DIAGNOSIS — J069 Acute upper respiratory infection, unspecified: Secondary | ICD-10-CM | POA: Diagnosis not present

## 2016-11-11 ENCOUNTER — Telehealth: Payer: Self-pay | Admitting: Nurse Practitioner

## 2016-11-11 DIAGNOSIS — H353132 Nonexudative age-related macular degeneration, bilateral, intermediate dry stage: Secondary | ICD-10-CM | POA: Diagnosis not present

## 2016-11-11 NOTE — Telephone Encounter (Signed)
Patient calling to speak with nurse regarding incontinence issues.

## 2016-11-11 NOTE — Telephone Encounter (Signed)
Spoke with patient. Patient states that she has had a cold and has been having increased coughing. With coughing she is having urinary incontinence. Also reports she is having trouble making it to the restroom when her bladder is full without having incontinence. Patient has been wearing pads daily and feels these are getting very large and uncomfortable. Asking if she may wear panties designed for urinary leakage and incontinence. Advised these are safe to use and may be more comfortable for her. Advised she will need to decrease her caffeine intake during the day and reduce fluids before bed. Patient reports she has done this. Does not desire to discuss any medication at this time. Would like to continue reducing caffeine intake and wear panties for incontinence/leakage. Will return call to the office if symptoms worsen or if she would like to be seen for further evaluation.  Kem Boroughs, FNP anything further for this patient?

## 2016-11-11 NOTE — Telephone Encounter (Signed)
Agree with recommendations.  

## 2016-11-14 DIAGNOSIS — H10501 Unspecified blepharoconjunctivitis, right eye: Secondary | ICD-10-CM | POA: Diagnosis not present

## 2016-11-22 DIAGNOSIS — H43813 Vitreous degeneration, bilateral: Secondary | ICD-10-CM | POA: Diagnosis not present

## 2016-11-22 DIAGNOSIS — H35371 Puckering of macula, right eye: Secondary | ICD-10-CM | POA: Diagnosis not present

## 2016-11-22 DIAGNOSIS — H47011 Ischemic optic neuropathy, right eye: Secondary | ICD-10-CM | POA: Diagnosis not present

## 2016-11-22 DIAGNOSIS — H353132 Nonexudative age-related macular degeneration, bilateral, intermediate dry stage: Secondary | ICD-10-CM | POA: Diagnosis not present

## 2016-11-25 ENCOUNTER — Telehealth: Payer: Self-pay | Admitting: Nurse Practitioner

## 2016-11-25 MED ORDER — FLUCONAZOLE 150 MG PO TABS: 150.0000 mg | ORAL_TABLET | Freq: Once | ORAL | 0 refills | Status: AC

## 2016-11-25 NOTE — Telephone Encounter (Signed)
Yes she may have Diflucan - has history of yeast infections a lot.  Tell her Merry Christmas!

## 2016-11-25 NOTE — Telephone Encounter (Signed)
Patient called and said, "I want to speak with the nurse so I can request a prescription for fluconizole be called into my pharmacy. I have been getting frequent yeast infections.  I am getting ready to fly out on a trip this afternoon."  Patient declined an appointment due to traveling.  Pharmacy on file is correct if needed.

## 2016-11-25 NOTE — Telephone Encounter (Signed)
Rx for Diflucan 150 mg po x 1, repeat in 72 hours if symptoms persist #2 0RF sent to pharmacy on file. Patient has been notified rx has been sent in and message as seen below from Kem Boroughs, [place].  Routing to provider for final review. Patient agreeable to disposition. Will close encounter.

## 2016-11-25 NOTE — Telephone Encounter (Signed)
Spoke with patient. Patient states that last week she had one day of vaginal itching that resolved. Last night she began to have vaginal itching that has gotten worse this morning. Denies any vaginal discharge. Has used left over nystatin and triamcinolone cream externally with relief. Patient is flying to [place] to see her son. Requesting rx for Diflucan be sent to pharmacy for pick up as she is still having internal itching and is worried about traveling with a yeast infection. Advised I will review with Kem Boroughs, FNP and return call. Patient is agreeable.  Kem Boroughs, FNP okay to send in Diflucan 150 mg po x 1 repeat in 72 hours if symptoms persist #2 0RF?

## 2016-12-11 DIAGNOSIS — H353132 Nonexudative age-related macular degeneration, bilateral, intermediate dry stage: Secondary | ICD-10-CM | POA: Diagnosis not present

## 2016-12-26 ENCOUNTER — Encounter (HOSPITAL_COMMUNITY): Payer: Self-pay | Admitting: Emergency Medicine

## 2016-12-26 ENCOUNTER — Emergency Department (HOSPITAL_COMMUNITY): Payer: Medicare Other

## 2016-12-26 ENCOUNTER — Emergency Department (HOSPITAL_COMMUNITY)
Admission: EM | Admit: 2016-12-26 | Discharge: 2016-12-26 | Disposition: A | Discharge status: Home / Self Care | Payer: Medicare Other | Attending: Emergency Medicine | Admitting: Emergency Medicine

## 2016-12-26 DIAGNOSIS — R531 Weakness: Secondary | ICD-10-CM | POA: Diagnosis not present

## 2016-12-26 DIAGNOSIS — J069 Acute upper respiratory infection, unspecified: Secondary | ICD-10-CM

## 2016-12-26 DIAGNOSIS — R404 Transient alteration of awareness: Secondary | ICD-10-CM | POA: Diagnosis not present

## 2016-12-26 DIAGNOSIS — R05 Cough: Secondary | ICD-10-CM | POA: Diagnosis present

## 2016-12-26 LAB — CBC WITH DIFFERENTIAL/PLATELET
Basophils Absolute: 0 10*3/uL (ref 0.0–0.1)
Basophils Relative: 0 %
EOS PCT: 0 %
Eosinophils Absolute: 0 10*3/uL (ref 0.0–0.7)
HCT: 37.5 % (ref 36.0–46.0)
Hemoglobin: 13.2 g/dL (ref 12.0–15.0)
LYMPHS ABS: 1 10*3/uL (ref 0.7–4.0)
Lymphocytes Relative: 17 %
MCH: 30.1 pg (ref 26.0–34.0)
MCHC: 35.2 g/dL (ref 30.0–36.0)
MCV: 85.4 fL (ref 78.0–100.0)
Monocytes Absolute: 0.8 10*3/uL (ref 0.1–1.0)
Monocytes Relative: 13 %
Neutro Abs: 4 10*3/uL (ref 1.7–7.7)
Neutrophils Relative %: 70 %
PLATELETS: 162 10*3/uL (ref 150–400)
RBC: 4.39 MIL/uL (ref 3.87–5.11)
RDW: 13.8 % (ref 11.5–15.5)
WBC: 5.7 10*3/uL (ref 4.0–10.5)

## 2016-12-26 LAB — COMPREHENSIVE METABOLIC PANEL
ALT: 24 U/L (ref 14–54)
AST: 41 U/L (ref 15–41)
Albumin: 3.9 g/dL (ref 3.5–5.0)
Alkaline Phosphatase: 74 U/L (ref 38–126)
Anion gap: 7 (ref 5–15)
BUN: 16 mg/dL (ref 6–20)
CO2: 26 mmol/L (ref 22–32)
Calcium: 8.9 mg/dL (ref 8.9–10.3)
Chloride: 95 mmol/L — ABNORMAL LOW (ref 101–111)
Creatinine, Ser: 0.6 mg/dL (ref 0.44–1.00)
Glucose, Bld: 114 mg/dL — ABNORMAL HIGH (ref 65–99)
POTASSIUM: 4.1 mmol/L (ref 3.5–5.1)
Sodium: 128 mmol/L — ABNORMAL LOW (ref 135–145)
Total Bilirubin: 0.4 mg/dL (ref 0.3–1.2)
Total Protein: 6.6 g/dL (ref 6.5–8.1)

## 2016-12-26 MED ORDER — SODIUM CHLORIDE 0.9 % IV BOLUS (SEPSIS)
1000.0000 mL | Freq: Once | INTRAVENOUS | Status: AC
  Administered 2016-12-26: 1000 mL via INTRAVENOUS

## 2016-12-26 MED ORDER — ACETAMINOPHEN 500 MG PO TABS
1000.0000 mg | ORAL_TABLET | Freq: Once | ORAL | Status: AC
  Administered 2016-12-26: 1000 mg via ORAL
  Filled 2016-12-26: qty 2

## 2016-12-26 NOTE — ED Triage Notes (Signed)
Pt reports to EMS cough x 6 days, fever yet not today, denies shortness of breath, Chest wall pain when coughing. Weakness x 6 days. Alert and oriented x 4.

## 2016-12-26 NOTE — ED Provider Notes (Signed)
[facility] DEPT Provider Note   CSN: [id number] Arrival date & time: 12/26/16  1359     History   Chief Complaint Chief Complaint  Patient presents with  . Cough    HPI [name] is a 83 y.o. female.  83 yo F with a chief complaint of cough and wheezing. Going on for the past week. Having fevers and chills as well. Denies sick contacts. She was talking with her son who is a paramedic in another state will call 911 because she was so weak. Patient states she's been having trouble getting around the house. Denies dysuria denies abdominal pain.   The history is provided by the patient.  Cough  This is a new problem. The current episode started more than 2 days ago. The problem occurs constantly. The problem has not changed since onset.The maximum temperature recorded prior to her arrival was 102 to 102.9 F. The fever has been present for 1 to 2 days. Pertinent negatives include no chest pain, no chills, no headaches, no rhinorrhea, no myalgias, no shortness of breath, no wheezing and no eye redness. She has tried nothing for the symptoms. The treatment provided no relief. She is a smoker (previous). Her past medical history is significant for COPD and emphysema.    Past Medical History:  Diagnosis Date  . Incontinent of urine   . Multiple sclerosis ([place]) 2015  . Osteoporosis    osteopenia  . Post-menopausal     There are no active problems to display for this patient.   Past Surgical History:  Procedure Laterality Date  . ABDOMINAL HYSTERECTOMY  1971   TVH  . breast ductectomy Right 05/1995  . COLONOSCOPY  10/2011    OB History    Gravida Para Term Preterm AB Living   2 2       2    SAB TAB Ectopic Multiple Live Births                   Home Medications    Prior to Admission medications   Medication Sig Start Date End Date Taking? Authorizing Provider  Ascorbic Acid (VITAMIN C) 100 MG tablet Take 200 mg by mouth 2 (two) times daily.    Yes  Historical Provider, MD  azelastine (ASTELIN) 137 MCG/SPRAY nasal spray Place 1 spray into the nose 2 (two) times daily as needed for allergies. Use in each nostril as directed    Yes Historical Provider, MD  Biotin 1 MG CAPS Take 1 capsule by mouth daily.    Yes Historical Provider, MD  Calcium Carbonate-Vitamin D (CALCIUM + D PO) Take 1 tablet by mouth 2 (two) times daily.    Yes Historical Provider, MD  levothyroxine (SYNTHROID, LEVOTHROID) 25 MCG tablet Take 25 mcg by mouth daily. 02/17/13  Yes Historical Provider, MD  Magnesium 500 MG TABS Take 1 tablet by mouth every evening.    Yes Historical Provider, MD  vitamin E 100 UNIT capsule Take 100 Units by mouth daily.   Yes Historical Provider, MD  lidocaine (XYLOCAINE) 2 % jelly Use pea size amount at the peri rectal area twice a day prn Patient not taking: Reported on 12/26/2016 09/27/16   Kem Boroughs, FNP  nystatin cream (MYCOSTATIN) Apply topically as needed. Patient not taking: Reported on 12/26/2016 09/27/16   Kem Boroughs, FNP  triamcinolone cream (KENALOG) 0.1 % Apply topically as needed. Patient not taking: Reported on 12/26/2016 09/27/16   Kem Boroughs, FNP  Vitamin D, Ergocalciferol, (DRISDOL) 50000 UNITS  CAPS capsule Take 1 capsule (50,000 Units total) by mouth every 7 (seven) days. 04/29/14   Kem Boroughs, FNP    Family History Family History  Problem Relation Age of Onset  . Osteoarthritis Father   . Stroke Father   . Heart failure Mother   . Hypertension Mother     Social History Social History  Substance Use Topics  . Smoking status: Never Smoker  . Smokeless tobacco: Never Used  . Alcohol use 3.6 oz/week    6 Glasses of wine per week     Allergies   Aspirin and Sulfa antibiotics   Review of Systems Review of Systems  Constitutional: Positive for fever. Negative for chills.  HENT: Positive for congestion. Negative for rhinorrhea.   Eyes: Negative for redness and visual disturbance.  Respiratory:  Positive for cough. Negative for shortness of breath and wheezing.   Cardiovascular: Negative for chest pain and palpitations.  Gastrointestinal: Negative for nausea and vomiting.  Genitourinary: Negative for dysuria and urgency.  Musculoskeletal: Negative for arthralgias and myalgias.  Skin: Negative for pallor and wound.  Neurological: Positive for weakness (generalized). Negative for dizziness and headaches.     Physical Exam Updated Vital Signs BP 147/75 (BP Location: Left Arm)   Pulse 71   Temp 99 F (37.2 C) (Oral)   Resp 16   LMP 09/08/1970 (Approximate)   SpO2 96%   Physical Exam  Constitutional: She is oriented to person, place, and time. She appears well-developed and well-nourished. No distress.  HENT:  Head: Normocephalic and atraumatic.  Swollen turbinates  Eyes: EOM are normal. Pupils are equal, round, and reactive to light.  Neck: Normal range of motion. Neck supple.  Cardiovascular: Normal rate and regular rhythm.  Exam reveals no gallop and no friction rub.   No murmur heard. Pulmonary/Chest: Effort normal. She has no wheezes. She has no rales.  Abdominal: Soft. She exhibits no distension and no mass. There is no tenderness. There is no guarding.  Musculoskeletal: She exhibits no edema or tenderness.  Neurological: She is alert and oriented to person, place, and time.  Skin: Skin is warm and dry. She is not diaphoretic.  Psychiatric: She has a normal mood and affect. Her behavior is normal.  Nursing note and vitals reviewed.    ED Treatments / Results  Labs (all labs ordered are listed, but only abnormal results are displayed) Labs Reviewed  COMPREHENSIVE METABOLIC PANEL - Abnormal; Notable for the following:       Result Value   Sodium 128 (*)    Chloride 95 (*)    Glucose, Bld 114 (*)    All other components within normal limits  CBC WITH DIFFERENTIAL/PLATELET    EKG  EKG Interpretation None       Radiology Dg Chest 2 View  Result Date:  12/26/2016 CLINICAL DATA:  Cough x6 days approximately 11/11/2013 CXR EXAM: CHEST  2 VIEW COMPARISON:  CXR 11/11/2013, MRI lumbar spine report 08/18/2013 FINDINGS: Lungs are hyperinflated. There is stable cardiomegaly with ectatic atherosclerotic thoracic aorta. No abdominal mass, pneumonic consolidation nor overt pulmonary edema. There is atelectasis and/or scarring at the left lung base. Minimal blunting of the left lateral and posterior costophrenic angle may reflect a trace effusion. Chronic stable lower thoracic compression fracture noted to be T9 on prior lumbar spine MRI report from 2014. No new fracture identified. IMPRESSION: Emphysematous hyperinflation of the lungs. Probable trace left effusion. No acute pulmonary disease. Chronic T9 compression fracture. Electronically Signed   By: Shanon Brow  Randel Pigg M.D.   On: 12/26/2016 14:49    Procedures Procedures (including critical care time)  Medications Ordered in ED Medications  sodium chloride 0.9 % bolus 1,000 mL (1,000 mLs Intravenous New Bag/Given 12/26/16 1518)  acetaminophen (TYLENOL) tablet 1,000 mg (1,000 mg Oral Given 12/26/16 1517)     Initial Impression / Assessment and Plan / ED Course  I have reviewed the triage vital signs and the nursing notes.  Pertinent labs & imaging results that were available during my care of the patient were reviewed by me and considered in my medical decision making (see chart for details).     83 yo F with a cc of viral respiratory syndrome.  CXR without pna, with hx of weakness, will check labs, give bolus.    Patient feeling mildly better after fluid bolus and Tylenol. The patient's son called the nurse and said that he was worried that she can't take care of herself at home and wanted her to be admitted. The patient currently electing for discharge. States that she is able to take care of herself at home. Suspect viral illness. PCP follow up.    4:13 PM:  I have discussed the diagnosis/risks/treatment  options with the patient and believe the pt to be eligible for discharge home to follow-up with PCP. We also discussed returning to the ED immediately if new or worsening sx occur. We discussed the sx which are most concerning (e.g., sudden worsening pain, fever, inability to tolerate by mouth) that necessitate immediate return. Medications administered to the patient during their visit and any new prescriptions provided to the patient are listed below.  Medications given during this visit Medications  sodium chloride 0.9 % bolus 1,000 mL (1,000 mLs Intravenous New Bag/Given 12/26/16 1518)  acetaminophen (TYLENOL) tablet 1,000 mg (1,000 mg Oral Given 12/26/16 1517)     The patient appears reasonably screen and/or stabilized for discharge and I doubt any other medical condition or other [facility] requiring further screening, evaluation, or treatment in the ED at this time prior to discharge.    Final Clinical Impressions(s) / ED Diagnoses   Final diagnoses:  Upper respiratory tract infection, unspecified type    New Prescriptions New Prescriptions   No medications on file     Deno Etienne, DO 12/26/16 1613

## 2016-12-26 NOTE — ED Notes (Signed)
Patient transported to X-ray 

## 2016-12-26 NOTE — Progress Notes (Signed)
ED Cm consulted with EDP, floyd who shared pt lives alone with dx MS (chroinc hx) with no local family available and may be interested in home health EDP states pt "she may decline" EPIC indicates in demographics pt has a son and daughter with out of town contact numbers ED CM spoke with pt's ED RN, Radene Ou about CM consult and possible known delays in home health locally related to inclement weather concerns RN assisted CM to speak with the pt who declines home health Pt states her daughter will be visiting her, she has MS and at baseline "still drives" and uses a cane prn at home but just "recently" has become "weak"  CM reviewed in details medicare guidelines, Choices of home health [facility]) (length of stay in home, types of [facility] staff available, coverage, primary caregiver, up to 24 hrs before services may be started) and choices of Private duty nursing (PDN-coverage, length of stay in the home types of staff available).   Pt discussed MS exacerbation concern Pt very appreciative of Cm contact with her and services offered Cm reminded pt if she changed her mind she can still contact her pcp to assist with home health services

## 2016-12-26 NOTE — Discharge Instructions (Signed)
Take tylenol 2 pills 4 times a day and motrin 4 pills 3 times a day.  Drink plenty of fluids.  Return for worsening shortness of breath, headache, confusion. Follow up with your family doctor.   

## 2017-01-01 DIAGNOSIS — J069 Acute upper respiratory infection, unspecified: Secondary | ICD-10-CM | POA: Diagnosis not present

## 2017-01-10 DIAGNOSIS — H353132 Nonexudative age-related macular degeneration, bilateral, intermediate dry stage: Secondary | ICD-10-CM | POA: Diagnosis not present

## 2017-02-09 DIAGNOSIS — H353132 Nonexudative age-related macular degeneration, bilateral, intermediate dry stage: Secondary | ICD-10-CM | POA: Diagnosis not present

## 2017-02-18 ENCOUNTER — Telehealth: Payer: Self-pay | Admitting: Nurse Practitioner

## 2017-02-18 NOTE — Telephone Encounter (Signed)
Spoke with patient. Patient states she thinks she has a constant yeast infection. Patient rpeorts symptoms of vaginal itching, odor and dark yellow vaginal discharge started over 2 weeks ago. Patient states she tried monistat OTC 3 day and 7 day with no relief. Patient reports she has stress incontinence and frequency, is unable to keep medication in long enough. Patient reports wearing a pad for stress incontinence and changes frequently. Reviewed peri care with patient and importance of keeping area clean and dry to prevent yeast. Recommended OV for further evaluation, patient scheduled for 3/14 at 11:15am with Kem Boroughs, NP. Patient verbalizes understanding and is agreeable.  Routing to provider for final review. Patient is agreeable to disposition. Will close encounter.

## 2017-02-18 NOTE — Progress Notes (Signed)
Patient ID: [name], female   DOB: 25-Mar-1932, 83 y.o.   MRN: [id number]  84 y.o. Widowed Caucasian female G2P2 here with complaint of vaginal symptoms of itching, burning, and increase discharge. Describes discharge as white, yellow and thick.  Used Monistat hs X 3 and no better.  She had recent bronchitis and oral surgery.  Onset of symptoms 14 days ago. Denies new personal products or vaginal dryness. Urinary symptoms none. Patient reports she has stress incontinence and frequency and wearing a pad and changes frequently.  O:  Healthy female WDWN Affect: normal, orientation x 3  Exam: Abdomen: Lymph node: no enlargement or tenderness Pelvic exam: External genital: normal female with areas of linear cuts and thickness. BUS: negative Vagina: yellow thin discharge noted.  Affirm taken.   A: Vaginitis - history  of chronic yeast   P: Discussed findings of vaginitis and etiology. Discussed Aveeno or baking soda sitz bath for comfort. Avoid moist clothes or pads for extended period of time. If working out in gym clothes or swim suits for long periods of time change underwear or bottoms of swimsuit if possible. Olive Oil/Coconut Oil use for skin protection prior to activity can be used to external skin.  Rx: she is given Diflucan 150 mg  Rx for Kenalog is changed to ointment instead of cream to use on the areas of LSA.  Follow with [facility]  Reviewed peri care with patient and importance of keeping area clean and dry to prevent yeast.  RV prn

## 2017-02-18 NOTE — Telephone Encounter (Signed)
Patient has a constant yeast infection that she can not get to clear up.  Patient would like something called into the pharmacy

## 2017-02-19 ENCOUNTER — Ambulatory Visit (INDEPENDENT_AMBULATORY_CARE_PROVIDER_SITE_OTHER): Payer: Medicare Other | Admitting: Nurse Practitioner

## 2017-02-19 ENCOUNTER — Encounter: Payer: Self-pay | Admitting: Nurse Practitioner

## 2017-02-19 VITALS — BP 120/74 | HR 64 | Ht 65.25 in | Wt 131.0 lb

## 2017-02-19 DIAGNOSIS — N760 Acute vaginitis: Secondary | ICD-10-CM

## 2017-02-19 MED ORDER — TRIAMCINOLONE ACETONIDE 0.025 % EX OINT: 1.0000 "application " | TOPICAL_OINTMENT | Freq: Two times a day (BID) | CUTANEOUS | 0 refills | Status: DC

## 2017-02-19 MED ORDER — FLUCONAZOLE 150 MG PO TABS: 150.0000 mg | ORAL_TABLET | Freq: Once | ORAL | 4 refills | Status: AC

## 2017-02-20 LAB — WET PREP BY MOLECULAR PROBE
Candida species: NOT DETECTED
GARDNERELLA VAGINALIS: NOT DETECTED
TRICHOMONAS VAG: NOT DETECTED

## 2017-02-20 NOTE — Progress Notes (Signed)
Encounter reviewed by Dr. Jaymarion Trombly Amundson C. Silva.  

## 2017-02-25 DIAGNOSIS — E559 Vitamin D deficiency, unspecified: Secondary | ICD-10-CM | POA: Diagnosis not present

## 2017-02-25 DIAGNOSIS — G35 Multiple sclerosis: Secondary | ICD-10-CM | POA: Diagnosis not present

## 2017-02-25 DIAGNOSIS — R609 Edema, unspecified: Secondary | ICD-10-CM | POA: Diagnosis not present

## 2017-02-25 DIAGNOSIS — E039 Hypothyroidism, unspecified: Secondary | ICD-10-CM | POA: Diagnosis not present

## 2017-02-25 DIAGNOSIS — E871 Hypo-osmolality and hyponatremia: Secondary | ICD-10-CM | POA: Diagnosis not present

## 2017-02-25 DIAGNOSIS — M810 Age-related osteoporosis without current pathological fracture: Secondary | ICD-10-CM | POA: Diagnosis not present

## 2017-02-27 DIAGNOSIS — G35 Multiple sclerosis: Secondary | ICD-10-CM | POA: Diagnosis not present

## 2017-03-11 DIAGNOSIS — H353132 Nonexudative age-related macular degeneration, bilateral, intermediate dry stage: Secondary | ICD-10-CM | POA: Diagnosis not present

## 2017-03-17 ENCOUNTER — Encounter: Payer: Self-pay | Admitting: Neurology

## 2017-03-17 ENCOUNTER — Ambulatory Visit (INDEPENDENT_AMBULATORY_CARE_PROVIDER_SITE_OTHER): Payer: Medicare Other | Admitting: Neurology

## 2017-03-17 ENCOUNTER — Encounter: Payer: Self-pay | Admitting: *Deleted

## 2017-03-17 VITALS — BP 192/81 | HR 67 | Resp 16 | Ht 65.25 in | Wt 132.0 lb

## 2017-03-17 DIAGNOSIS — G35 Multiple sclerosis: Secondary | ICD-10-CM | POA: Diagnosis not present

## 2017-03-17 DIAGNOSIS — R600 Localized edema: Secondary | ICD-10-CM

## 2017-03-17 DIAGNOSIS — M21372 Foot drop, left foot: Secondary | ICD-10-CM | POA: Diagnosis not present

## 2017-03-17 DIAGNOSIS — G959 Disease of spinal cord, unspecified: Secondary | ICD-10-CM | POA: Diagnosis not present

## 2017-03-17 NOTE — Progress Notes (Signed)
[facility]  PATIENT: [name] DOB: 1932/01/08  REFERRING DOCTOR OR PCP:  [name] SOURCE:   Patient, notes from Dr. Marisue Humble and Dr. Catalina Gravel, imaging results, MRI images on PACS.  _________________________________   HISTORICAL  CHIEF COMPLAINT:  Chief Complaint  Patient presents with  . Gait Disturbance    [name] is here for eval of gait disturbance, onset years ago, most recently followed by Dr. Catalina Gravel here in [place].  Sts. she was dx. with MS but has never been on any medications.  Sts. she has 1 paternal first cousin who had MS, 2 maternal first cousins with MS/fim  . Abnormal MRI    HISTORY OF PRESENT ILLNESS:  I had the pleasure seeing you patient, [name], at the [place] [facility]: [facility] for a neurologic consultation regarding her gait disturbance. She is an 83 year old woman who has had difficulty with her gait for many years. Her left leg seemed clumsy at least 10-15 years ago.   Her left toes would drag at times and her balance was poor.    About 10 years ago, she fell at church and broke her pelvis tripping over a gown.   Her gait continues to worsen.   Her right leg does well.   Her left arm is slightly clumsy and slightly weaker compared to her right.     All of her changes have been gradual.     She denies any numbness.   In 2014, she had MRIs showing many T2/FLAIR hyperintense foci in the brain. 82, the pattern was nonspecific. However, MRI of the cervical and thoracic spine showed adjacent to C2 to the right and a plaque adjacent to T6 to the left  She has had problems with bladder function.   She has no significant hesitancy.    However, she has a lot of starts and stops.   She has no recent UTI.   She has hesitancy several times at night and has urinary frequency and urgency with incontinence a couple times daily.    She had temporal arteritis and was referred for a biopsy.   She was on prednisone x one year.     Her vision was affected by temporal arteritis and she has macular degeneration.    She has a visual field at home machine and uses it once daily.    She has mild depression but no anxiety.   She is active and [facility] and church activities 3-4 times a week including choir.   She has had some stress with her husband of 74 years dying 7 years ago and her second husband.    She gets tired easily.   She sleeps well most nights.   She takes a short naps some days.    I personally reviewed the MRIs of her brain and spine in 11/11/2013.   The MRI of the cervical spine shows a focus to the right at C2 and another focus to the left at C6. It also shows multilevel degenerative changes with anterolisthesis of C4 upon C5 but no nerve root compression.  No spinal cord compression.  The MRI of the thoracic spine shows a focus to the left adjacent to T6. It also shows a chronic T9 compression fracture.  MRI of the brain shows many T2/FLAIR hyperintense foci.  Foci are non-specific though many of the foci are radially oriented to the ventricles in the periventricular white matter. Some foci in the pons  have an appearance more typical for chronic microvascular ischemic change.  About 4 years ago, she was lifted off the ground and had severe back pain for a week or 2 afterwards. The pain was in the mid to lower thoracic spine. MRI 2014 showed a chronic compression fracture at T9 and she can not think of no other episode of significant back pain.  Her one paternal first cousin and 2 maternal cousins have MS.    She does not know whether they had PPMS or RRMS.  She has a torturous right ICA but no stenosis.     REVIEW OF SYSTEMS: Constitutional: No fevers, chills, sweats, or change in appetite Eyes: No visual changes, double vision, eye pain Ear, nose and throat: No hearing loss, ear pain, nasal congestion, sore throat Cardiovascular: No chest pain, palpitations Respiratory: No shortness of breath  at rest or with exertion.   No wheezes GastrointestinaI: No nausea, vomiting, diarrhea, abdominal pain, fecal incontinence Genitourinary: She has urinary frequency, urgency and continence. She also notes occasional hesitancy and she does not think that she completely empties. She has an appointment to see urology..    Musculoskeletal: No current neck pain, back pain Integumentary: No rash, pruritus, skin lesions Neurological: as above Psychiatric: Mild depression but no anxiety Endocrine: No palpitations, diaphoresis, change in appetite, change in weigh or increased thirst Hematologic/Lymphatic: No anemia, purpura, petechiae. Allergic/Immunologic: No itchy/runny eyes, nasal congestion, recent allergic reactions, rashes  ALLERGIES: Allergies  Allergen Reactions  . Aspirin Nausea Only  . Penicillins Diarrhea  . Sulfa Antibiotics     HOME MEDICATIONS:  Current Outpatient Prescriptions:  .  Ascorbic Acid (VITAMIN C) 100 MG tablet, Take 200 mg by mouth 2 (two) times daily. , Disp: , Rfl:  .  azelastine (ASTELIN) 137 MCG/SPRAY nasal spray, Place 1 spray into the nose 2 (two) times daily as needed for allergies. Use in each nostril as directed , Disp: , Rfl:  .  Biotin 1 MG CAPS, Take 1 capsule by mouth daily. , Disp: , Rfl:  .  Calcium Carbonate-Vitamin D (CALCIUM + D PO), Take 1 tablet by mouth 2 (two) times daily. , Disp: , Rfl:  .  levothyroxine (SYNTHROID, LEVOTHROID) 25 MCG tablet, Take 25 mcg by mouth daily., Disp: , Rfl:  .  Magnesium 500 MG TABS, Take 1 tablet by mouth every evening. , Disp: , Rfl:  .  Vitamin D, Ergocalciferol, (DRISDOL) 50000 UNITS CAPS capsule, Take 1 capsule (50,000 Units total) by mouth every 7 (seven) days., Disp: 30 capsule, Rfl: 3 .  vitamin E 100 UNIT capsule, Take 100 Units by mouth daily., Disp: , Rfl:  .  nystatin cream (MYCOSTATIN), Apply topically as needed. (Patient not taking: Reported on 03/17/2017), Disp: 30 g, Rfl: 4 .  triamcinolone (KENALOG)  0.025 % ointment, Apply 1 application topically 2 (two) times daily. (Patient not taking: Reported on 03/17/2017), Disp: 30 g, Rfl: 0 .  triamcinolone cream (KENALOG) 0.1 %, Apply topically as needed. (Patient not taking: Reported on 03/17/2017), Disp: 30 g, Rfl: 4  PAST MEDICAL HISTORY: Past Medical History:  Diagnosis Date  . Incontinent of urine   . Multiple sclerosis ([place]) 2015  . Osteoporosis    osteopenia  . Post-menopausal     PAST SURGICAL HISTORY: Past Surgical History:  Procedure Laterality Date  . ABDOMINAL HYSTERECTOMY  1971   TVH  . breast ductectomy Right 05/1995  . COLONOSCOPY  10/2011    FAMILY HISTORY: Family History  Problem Relation Age of  Onset  . Osteoarthritis Father   . Stroke Father   . Heart failure Mother   . Hypertension Mother   . Heart disease Mother   . Multiple sclerosis Cousin   . Multiple sclerosis Cousin   . Multiple sclerosis Cousin     SOCIAL HISTORY:  Social History   Social History  . Marital status: Widowed    Spouse name: N/A  . Number of children: N/A  . Years of education: N/A   Occupational History  . Not on file.   Social History Main Topics  . Smoking status: Never Smoker  . Smokeless tobacco: Never Used  . Alcohol use 3.6 oz/week    6 Glasses of wine per week  . Drug use: No  . Sexual activity: Not Currently    Birth control/ protection: Post-menopausal, Surgical   Other Topics Concern  . Not on file   Social History Narrative  . No narrative on file     PHYSICAL EXAM  Vitals:   03/17/17 0902  BP: (!) 192/81  Pulse: 67  Resp: 16  Weight: 132 lb (59.9 kg)  Height: 5' 5.25" (1.657 m)    Body mass index is 21.8 kg/m.   General: The patient is well-developed and well-nourished and in no acute distress  Eyes:  Funduscopic exam difficult due to small pupils.     Neck: The neck is supple, no carotid bruits are noted.  Right ICA pulse is asymmetrically visible..  The neck is  nontender.  Cardiovascular: The heart has a regular rate and rhythm with a normal S1 and S2. There were no murmurs, gallops or rubs. Lungs are clear to auscultation.  Skin: Extremities are without rash but she has significant pedal edema.  Musculoskeletal:  Back is nontender  Neurologic Exam  Mental status: The patient is alert and oriented x 3 at the time of the examination. The patient has apparent normal recent and remote memory, with an apparently normal attention span and concentration ability.   Speech is normal.  Cranial nerves: Extraocular movements are full. Pupils are equal, round, and reactive to light and accomodation.    There is good facial sensation to soft touch bilaterally.Facial strength is normal.  Trapezius and sternocleidomastoid strength is normal. No dysarthria is noted.  The tongue is midline, and the patient has symmetric elevation of the soft palate. No obvious hearing deficits are noted.  Motor:  Muscle bulk is normal.   Tone is increased in left > right leg. Strength is  5 / 5 in all 4 extremities except 4+/5 left EHL in foot.  Sensory: Sensory testing shows reduced vibration and temperature sensations on the left arm and leg. Further reduction of sensation on her toes worse than ankles  Coordination: Cerebellar testing reveals good finger-nose-finger and reduced left heel-to-shin.  Gait and station: Station is normal.   Gait is wide and she inverts left foot with a mild left foot drop.  . Tandem gait is normal. Romberg is negative.   Reflexes: Deep tendon reflexes are symmetric and normal bilaterally.   Plantar responses are extensor on her left. 0 25 foot timed walk is 15.1 seconds    DIAGNOSTIC DATA (LABS, IMAGING, TESTING) - I reviewed patient records, labs, notes, testing and imaging myself where available.  Lab Results  Component Value Date   WBC 5.7 12/26/2016   HGB 13.2 12/26/2016   HCT 37.5 12/26/2016   MCV 85.4 12/26/2016   PLT 162 12/26/2016       Component  Value Date/Time   NA 128 (L) 12/26/2016 1501   K 4.1 12/26/2016 1501   CL 95 (L) 12/26/2016 1501   CO2 26 12/26/2016 1501   GLUCOSE 114 (H) 12/26/2016 1501   BUN 16 12/26/2016 1501   CREATININE 0.60 12/26/2016 1501   CALCIUM 8.9 12/26/2016 1501   PROT 6.6 12/26/2016 1501   ALBUMIN 3.9 12/26/2016 1501   AST 41 12/26/2016 1501   ALT 24 12/26/2016 1501   ALKPHOS 74 12/26/2016 1501   BILITOT 0.4 12/26/2016 1501   GFRNONAA >60 12/26/2016 1501   GFRAA >60 12/26/2016 1501       ASSESSMENT AND PLAN  Primary chronic progressive multiple sclerosis (HCC)  Left foot drop  Myelopathy (HCC)  Pedal edema   In summary, [name] is an 83 year old woman who has had a progressive gait disturbance over the last 10+ years and has MRIs of the spine showing 3 T2 hyperintense foci that are laterally located. The MRI of the brain shows nonspecific lesions though many are periventricular and I can't rule out an overlap of demyelination with chronic microvascular ischemic change.  The location of the spinal plaques would be very consistent with multiple sclerosis.  I had a long discussion with her. Her imaging studies and history are certainly consistent with primary progressive multiple sclerosis though this is unusual to present at her age. I told her I think the likelihood that this is a PPMS is about 75%.   If she has abnormal CSF consistent with MS, then, we could be more certain of the diagnosis.  We discussed ocrelizumab as a recently approved therapy for primary progressive MS. In clinical studies, it slowed progression down by about 25%.   The risks are relatively mild though this might be higher at her age as study was done from age 21-60. She is concerned about taking a medication with potential side effects given the slow progression over many years and her current age. If we did start the medication, I would want to do the lumbar puncture first to be more certain of the  diagnosis .    She has a mild foot drop with inversion of the foot as she walks. She might walk better with an AFO splint and we will send her to an orthotics to make one for her.   Additionally, if not better, we could also consider Ampyra to help her walking speed and balance.  She will return to see me in 3 months but sooner if there are new or worsening neurologic symptoms.  Thank you for asking me to see [name] at the [place] center at [facility] for a neurologic consultation. Please let me know if I can be of further assistance with her or other patients in the future.  Sequan Auxier A. Felecia Shelling, MD, PhD 03/17/2017, 11:13 AM Director of the [place] at [facility] Certified in Neurology, [place] [facility], Sleep Medicine, Pain Medicine and [facility] [address] [place], [place] [address] [phone]

## 2017-04-09 ENCOUNTER — Ambulatory Visit (INDEPENDENT_AMBULATORY_CARE_PROVIDER_SITE_OTHER): Payer: Medicare Other | Admitting: Podiatry

## 2017-04-09 DIAGNOSIS — L84 Corns and callosities: Secondary | ICD-10-CM | POA: Diagnosis not present

## 2017-04-09 DIAGNOSIS — R351 Nocturia: Secondary | ICD-10-CM | POA: Diagnosis not present

## 2017-04-09 DIAGNOSIS — N3946 Mixed incontinence: Secondary | ICD-10-CM | POA: Diagnosis not present

## 2017-04-09 DIAGNOSIS — R350 Frequency of micturition: Secondary | ICD-10-CM | POA: Diagnosis not present

## 2017-04-09 NOTE — Progress Notes (Signed)
Patient ID: [name], female   DOB: July 09, 1932, 83 y.o.   MRN: [id number]   Subjective: This patient presents today complaining of uncomfortable corns on the distal aspects of the second and third toes bilaterally. Patient has ongoing problem with these corns and wears open toe shoes. Patient has MS resulting in continuous flexion of the toes causing the distal course develop in the right and left feet. Last visit for this similar problem was 03/19/2016  Objective: Orientated 3  Vascular: Pitting edema left DP and PT pulses 1/4 bilaterally Capillary reflex within normal limits bilaterally  Neurological: Sensation to 10 g monofilament wire intact 3/5 bilaterally Vibratory sensation reactive bilaterally Ankle reflex weakly reactive bilaterally  Dermatological: No open skin lesions bilaterally Distal toes 2 and 3 after reactive keratoses with some bleeding within the callus bilaterally  Musculoskeletal: Spastic left lower extremity with left foot in a varus position Plantar flexion right 5/5 right and 3/5 left Dorsi flexion right 5/5 and 4/5 left Patient has an unsteady gait pattern and constant flexion of the toes during gait. She uses a cane for stability  Assessment: Gait disturbance associated with MS Spastic left lower extremity with muscle weakness left lower extremity Reactive callus distal toes right and left associated with patient's attempt to flex toes for stability walking  Plan: [place]bride distal corns right and left feet without any bleeding  Reappoint at patient's request

## 2017-04-09 NOTE — Patient Instructions (Signed)
Return as needed for trimming of corns on the ends of the toes

## 2017-04-10 DIAGNOSIS — H353132 Nonexudative age-related macular degeneration, bilateral, intermediate dry stage: Secondary | ICD-10-CM | POA: Diagnosis not present

## 2017-05-06 DIAGNOSIS — Z1231 Encounter for screening mammogram for malignant neoplasm of breast: Secondary | ICD-10-CM | POA: Diagnosis not present

## 2017-05-10 DIAGNOSIS — H353132 Nonexudative age-related macular degeneration, bilateral, intermediate dry stage: Secondary | ICD-10-CM | POA: Diagnosis not present

## 2017-05-13 ENCOUNTER — Ambulatory Visit (INDEPENDENT_AMBULATORY_CARE_PROVIDER_SITE_OTHER): Payer: Medicare Other | Admitting: Nurse Practitioner

## 2017-05-13 ENCOUNTER — Encounter: Payer: Self-pay | Admitting: Nurse Practitioner

## 2017-05-13 VITALS — BP 120/70 | HR 64 | Resp 12 | Ht 64.75 in | Wt 129.8 lb

## 2017-05-13 DIAGNOSIS — Z01419 Encounter for gynecological examination (general) (routine) without abnormal findings: Secondary | ICD-10-CM | POA: Diagnosis not present

## 2017-05-13 DIAGNOSIS — N393 Stress incontinence (female) (male): Secondary | ICD-10-CM | POA: Diagnosis not present

## 2017-05-13 DIAGNOSIS — Z124 Encounter for screening for malignant neoplasm of cervix: Secondary | ICD-10-CM | POA: Diagnosis not present

## 2017-05-13 DIAGNOSIS — G35 Multiple sclerosis: Secondary | ICD-10-CM | POA: Diagnosis not present

## 2017-05-13 MED ORDER — FLUCONAZOLE 150 MG PO TABS: 150.0000 mg | ORAL_TABLET | Freq: Once | ORAL | 1 refills | Status: AC

## 2017-05-13 NOTE — Progress Notes (Signed)
Reviewed personally.  M. Suzanne Tahari Clabaugh, MD.  

## 2017-05-13 NOTE — Progress Notes (Signed)
83 y.o. G2P2 Widowed  Caucasian Fe here for annual exam.  New neurology Dr. Felecia Shelling who is looking at her PPMS and left foot drop.  May start her on new med's if he confirms results by spinal tap. Still using a cane.   Now seeing urologist Dr. McDiarmid  and doing Kegel exercise and taking Myrbetriq samples currently.  She thinks the incontinence is some better. Less use of pads and therefore less yeast infections.  Patient's last menstrual period was 09/08/1970 (approximate).          Sexually active: No.  The current method of family planning is status post hysterectomy.    Exercising: Yes.    Goes to [employer], machines Smoker:  no  Health Maintenance: Pap:  05/04/15 Neg; 06/12/2000 Neg History of Abnormal Pap: no MMG:  04/26/16 BIRADS1, Density C, [facility] (had last week - will get a copy) Self Breast exams: yes Colonoscopy:  10/2011 Normal. Repeat 10 yrs BMD:  02/21/16 Osteoporosis Total spine: --3.2; R Femur Neck: -2.9; L Femur Neck: -3.2 TDaP:  PCP Shingles: 2015 Pneumonia: 2015 Hep C and HIV: PCP Labs: PCP   reports that she has never smoked. She has never used smokeless tobacco. She reports that she does not drink alcohol or use drugs.  Past Medical History:  Diagnosis Date  . Incontinent of urine   . Multiple sclerosis ([place]) 2015  . Osteoporosis    osteopenia  . Post-menopausal     Past Surgical History:  Procedure Laterality Date  . ABDOMINAL HYSTERECTOMY  1971   TVH  . breast ductectomy Right 05/1995  . COLONOSCOPY  10/2011    Current Outpatient Prescriptions  Medication Sig Dispense Refill  . Ascorbic Acid (VITAMIN C) 100 MG tablet Take 200 mg by mouth 2 (two) times daily.     Marland Kitchen azelastine (ASTELIN) 137 MCG/SPRAY nasal spray Place 1 spray into the nose 2 (two) times daily as needed for allergies. Use in each nostril as directed     . Biotin 1 MG CAPS Take 1 capsule by mouth daily.     . Calcium Carbonate-Vitamin D (CALCIUM + D PO) Take 1 tablet by mouth 2 (two) times daily.      Marland Kitchen levothyroxine (SYNTHROID, LEVOTHROID) 25 MCG tablet Take 25 mcg by mouth daily.    . Magnesium 500 MG TABS Take 1 tablet by mouth every evening.     . mirabegron ER (MYRBETRIQ) 25 MG TB24 tablet Take 25 mg by mouth daily.    Marland Kitchen triamcinolone cream (KENALOG) 0.1 % Apply topically as needed. 30 g 4  . Vitamin D, Ergocalciferol, (DRISDOL) 50000 UNITS CAPS capsule Take 1 capsule (50,000 Units total) by mouth every 7 (seven) days. 30 capsule 3  . vitamin E 100 UNIT capsule Take 100 Units by mouth daily.    . fluconazole (DIFLUCAN) 150 MG tablet Take 1 tablet (150 mg total) by mouth once. Take one tablet.  Repeat in 48 hours if symptoms are not completely resolved. 2 tablet 1   No current facility-administered medications for this visit.     Family History  Problem Relation Age of Onset  . Osteoarthritis Father   . Stroke Father   . Heart failure Mother   . Hypertension Mother   . Heart disease Mother   . Multiple sclerosis Cousin   . Multiple sclerosis Cousin   . Multiple sclerosis Cousin     ROS:  Pertinent items are noted in HPI.  Otherwise, a comprehensive ROS was negative.  Exam:  BP 120/70 (BP Location: Right Arm, Patient Position: Sitting, Cuff Size: Normal)   Pulse 64   Resp 12   Ht 5' 4.75" (1.645 m)   Wt 129 lb 12.8 oz (58.9 kg)   LMP 09/08/1970 (Approximate)   BMI 21.77 kg/m  Height: 5' 4.75" (164.5 cm) Ht Readings from Last 3 Encounters:  05/13/17 5' 4.75" (1.645 m)  03/17/17 5' 5.25" (1.657 m)  02/19/17 5' 5.25" (1.657 m)    General appearance: alert, cooperative and appears stated age Head: Normocephalic, without obvious abnormality, atraumatic Neck: no adenopathy, supple, symmetrical, trachea midline and thyroid normal to inspection and palpation Lungs: clear to auscultation bilaterally Breasts: normal appearance, no masses or tenderness Heart: regular rate and rhythm Abdomen: soft, non-tender; no masses,  no organomegaly Extremities: extremities normal,  atraumatic, no cyanosis. Chronic pedal edema Skin: Skin color, texture, turgor normal. No rashes or lesions Lymph nodes: Cervical, supraclavicular, and axillary nodes normal. No abnormal inguinal nodes palpated Neurologic: Grossly normal   Pelvic: External genitalia:  no lesions              Urethra:  normal appearing urethra with no masses, tenderness or lesions              Bartholin's and Skene's: normal                 Vagina: normal appearing vagina with normal color and discharge, no lesions              Cervix: absent              Pap taken: No. Bimanual Exam:  Uterus:  uterus absent              Adnexa: no mass, fullness, tenderness               Rectovaginal: Confirms               Anus:  normal sphincter tone, no lesions  Chaperone present: yes  A:  Well Woman with normal exam  Postmenopausal off ERT since 2015  Atrophic vaginitis off Vagifem since 2015  S/P TVH secondary to scar tissue 1971  History of Vit D deficiency, hypothyroid  Osteoporosis followed by Dr. Chalmers Cater (past Forteo & Reclast)  History of MS and left foot drop - now followed by new Neurologist  Pedal edema is unchanged   P:   Reviewed health and wellness pertinent to exam  Pap smear: no  Mammogram is due next May  Refill on Diflucan in case this is needed over the summer  She may return every other year  Counseled on breast self exam, mammography screening, adequate intake of calcium and vitamin D, diet and exercise, Kegel's exercises return every other year or prn  An After Visit Summary was printed and given to the patient.

## 2017-05-13 NOTE — Patient Instructions (Signed)

## 2017-05-28 ENCOUNTER — Other Ambulatory Visit: Payer: Self-pay | Admitting: Dermatology

## 2017-05-28 DIAGNOSIS — L821 Other seborrheic keratosis: Secondary | ICD-10-CM | POA: Diagnosis not present

## 2017-05-28 DIAGNOSIS — B078 Other viral warts: Secondary | ICD-10-CM | POA: Diagnosis not present

## 2017-05-28 DIAGNOSIS — D225 Melanocytic nevi of trunk: Secondary | ICD-10-CM | POA: Diagnosis not present

## 2017-05-28 DIAGNOSIS — D485 Neoplasm of uncertain behavior of skin: Secondary | ICD-10-CM | POA: Diagnosis not present

## 2017-05-28 DIAGNOSIS — L820 Inflamed seborrheic keratosis: Secondary | ICD-10-CM | POA: Diagnosis not present

## 2017-05-28 DIAGNOSIS — L814 Other melanin hyperpigmentation: Secondary | ICD-10-CM | POA: Diagnosis not present

## 2017-06-09 DIAGNOSIS — H353132 Nonexudative age-related macular degeneration, bilateral, intermediate dry stage: Secondary | ICD-10-CM | POA: Diagnosis not present

## 2017-06-16 DIAGNOSIS — H353131 Nonexudative age-related macular degeneration, bilateral, early dry stage: Secondary | ICD-10-CM | POA: Diagnosis not present

## 2017-06-16 DIAGNOSIS — Z961 Presence of intraocular lens: Secondary | ICD-10-CM | POA: Diagnosis not present

## 2017-06-19 ENCOUNTER — Ambulatory Visit (INDEPENDENT_AMBULATORY_CARE_PROVIDER_SITE_OTHER): Payer: Medicare Other | Admitting: Neurology

## 2017-06-19 ENCOUNTER — Encounter: Payer: Self-pay | Admitting: Neurology

## 2017-06-19 DIAGNOSIS — M21372 Foot drop, left foot: Secondary | ICD-10-CM

## 2017-06-19 DIAGNOSIS — G959 Disease of spinal cord, unspecified: Secondary | ICD-10-CM

## 2017-06-19 DIAGNOSIS — R600 Localized edema: Secondary | ICD-10-CM

## 2017-06-19 DIAGNOSIS — G35 Multiple sclerosis: Secondary | ICD-10-CM | POA: Diagnosis not present

## 2017-06-19 NOTE — Progress Notes (Signed)
[facility]  PATIENT: [name] DOB: [birth year]  REFERRING DOCTOR OR PCP:  [name] SOURCE:   Patient, notes from Dr. Marisue Humble and Dr. Catalina Gravel, imaging results, MRI images on PACS.  _________________________________   HISTORICAL  CHIEF COMPLAINT:  Chief Complaint  Patient presents with  . Gait Disturbance    Sts. AFO has been made, but she hasn't picked it up, due to difficulty [employer] had in reaching her. Feels some worse in general in this warmer, more humid weather/fim    HISTORY OF PRESENT ILLNESS:  [name] is an 83 year old woman Who appears to have primary progressive multiple sclerosis.   Gait/strength/sensation: She has a long history of difficulties with her gait. The left side is worse than the right side and her legs are much weaker than the arms (only minimal left weakness).    Gait has been worse with the recent heat.   She feels balance has worsened more this year.    We have tried to get her an AFO but there has been some issues getting in contact with [employer].     Bladder:   She has bladder frequency and hesitancy.  Myrbetriq has helped the frequency but she has some more hesitancy and also notes more constipation.     She has no recent UTI.   She has hesitancy several times at night and has urinary frequency and urgency with incontinence a couple times daily.     Vision:    Her vision was affected by temporal arteritis (2004) and she was on prednisone a year.   She also has macular degeneration.    She has a visual field at home machine and uses it once daily.    Mood:   She has noted mild depression after the death of her husband.     She is [facility] and [facility] and church activities 3-4 times a week including choir.   She has had some stress with her husband of 23 years dying 7-8 years ago and her second husband dying more recently.  Fatigue:    She notes her fatigue is much worse with heat, physically and  cognitively.  Sometimes she needs a nap due to fatigfue and sleepiness.   She sleeps well most nights.      MS History:   She has had difficulty with her gait for many years. Her left leg seemed clumsy at least 10-15 years ago.   Her left toes would drag at times and her balance was poor.    About 2011, she fell at church and broke her pelvis tripping over a gown.   Her gait continues to worsen.   Her right leg does well.   Her left arm is slightly clumsy and slightly weaker compared to her right.     All of her changes have been gradual.     She denies any numbness.   In 2014, she had MRIs showing many T2/FLAIR hyperintense foci in the brain. At her age,  the pattern was felt to be nonspecific. However, MRI of the cervical and thoracic spine showed adjacent to C2 to the right and a plaque adjacent to T6 to the left  I have reviewed the MRIs of her brain and spine in 11/11/2013.   The MRI of the cervical spine shows a focus to the right at C2 and another focus to the left at C6. It also shows multilevel degenerative changes with anterolisthesis of C4 upon  C5 but no nerve root compression.  No spinal cord compression.  The MRI of the thoracic spine shows a focus to the left adjacent to T6. It also showed a chronic T9 compression fracture (she was never aware of this).  MRI of the brain shows many T2/FLAIR hyperintense foci.  Foci are non-specific though many of the foci are radially oriented to the ventricles in the periventricular white matter. Some foci in the pons have an appearance more typical for chronic microvascular ischemic change. MRI 2014 showed a chronic compression fracture at T9 and she can not think of no other episode of significant back pain.  Her one paternal first cousin and 2 maternal cousins have MS.    She does not know whether they had PPMS or RRMS.    REVIEW OF SYSTEMS: Constitutional: No fevers, chills, sweats, or change in appetite.   Some fatigue.    Eyes: No visual changes,  double vision, eye pain Ear, nose and throat: No hearing loss, ear pain, nasal congestion, sore throat Cardiovascular: No chest pain, palpitations Respiratory: No shortness of breath at rest or with exertion.   No wheezes GastrointestinaI: No nausea, vomiting, diarrhea, abdominal pain, fecal incontinence Genitourinary: see above.  .    Musculoskeletal: No current neck pain, back pain Integumentary: No rash, pruritus, skin lesions Neurological: as above Psychiatric: Mild depression but no anxiety Endocrine: No palpitations, diaphoresis, change in appetite, change in weigh or increased thirst Hematologic/Lymphatic: No anemia, purpura, petechiae. Allergic/Immunologic: No itchy/runny eyes, nasal congestion, recent allergic reactions, rashes  ALLERGIES: Allergies  Allergen Reactions  . Aspirin Nausea Only  . Penicillins Diarrhea  . Sulfa Antibiotics     HOME MEDICATIONS:  Current Outpatient Prescriptions:  .  Ascorbic Acid (VITAMIN C) 100 MG tablet, Take 200 mg by mouth 2 (two) times daily. , Disp: , Rfl:  .  azelastine (ASTELIN) 137 MCG/SPRAY nasal spray, Place 1 spray into the nose 2 (two) times daily as needed for allergies. Use in each nostril as directed , Disp: , Rfl:  .  Biotin 1 MG CAPS, Take 1 capsule by mouth daily. , Disp: , Rfl:  .  Calcium Carbonate-Vitamin D (CALCIUM + D PO), Take 1 tablet by mouth 2 (two) times daily. , Disp: , Rfl:  .  levothyroxine (SYNTHROID, LEVOTHROID) 25 MCG tablet, Take 25 mcg by mouth daily., Disp: , Rfl:  .  Magnesium 500 MG TABS, Take 1 tablet by mouth every evening. , Disp: , Rfl:  .  mirabegron ER (MYRBETRIQ) 25 MG TB24 tablet, Take 25 mg by mouth daily., Disp: , Rfl:  .  Multiple Vitamins-Minerals (ICAPS AREDS FORMULA PO), Take by mouth., Disp: , Rfl:  .  triamcinolone cream (KENALOG) 0.1 %, Apply topically as needed., Disp: 30 g, Rfl: 4 .  Vitamin D, Ergocalciferol, (DRISDOL) 50000 UNITS CAPS capsule, Take 1 capsule (50,000 Units total)  by mouth every 7 (seven) days., Disp: 30 capsule, Rfl: 3 .  vitamin E 100 UNIT capsule, Take 100 Units by mouth daily., Disp: , Rfl:   PAST MEDICAL HISTORY: Past Medical History:  Diagnosis Date  . Incontinent of urine   . Multiple sclerosis ([place]) 2015  . Osteoporosis    osteopenia  . Post-menopausal     PAST SURGICAL HISTORY: Past Surgical History:  Procedure Laterality Date  . ABDOMINAL HYSTERECTOMY  1971   TVH  . breast ductectomy Right 05/1995  . COLONOSCOPY  10/2011    FAMILY HISTORY: Family History  Problem Relation Age of Onset  .  Osteoarthritis Father   . Stroke Father   . Heart failure Mother   . Hypertension Mother   . Heart disease Mother   . Multiple sclerosis Cousin   . Multiple sclerosis Cousin   . Multiple sclerosis Cousin     SOCIAL HISTORY:  Social History   Social History  . Marital status: Widowed    Spouse name: N/A  . Number of children: N/A  . Years of education: N/A   Occupational History  . Not on file.   Social History Main Topics  . Smoking status: Never Smoker  . Smokeless tobacco: Never Used  . Alcohol use No  . Drug use: No  . Sexual activity: Not Currently    Partners: Male    Birth control/ protection: Post-menopausal, Surgical   Other Topics Concern  . Not on file   Social History Narrative  . No narrative on file     PHYSICAL EXAM  There were no vitals filed for this visit.  There is no height or weight on file to calculate BMI.   General: The patient is well-developed and well-nourished and in no acute distress   Skin: Extremities show no rash and left > right pedal edema.  Musculoskeletal:  Back is nontender  Neurologic Exam  Mental status: The patient is alert and oriented x 3 at the time of the examination. The patient has apparent normal recent and remote memory, with an apparently normal attention span and concentration ability.   Speech is normal.  Cranial nerves: Extraocular movements are full.  Facial strength and sensation is normal.   Trapezius and sternocleidomastoid strength is normal. No dysarthria is noted.  The tongue is midline, and the patient has symmetric elevation of the soft palate. No obvious hearing deficits are noted.  Motor:  Muscle bulk is normal.   Tone is increased in the legs, left > right.   Strength is 5/5 in the arms (but reduced left RAM) and 5/5 in right leg and proximal left leg and 4/5 distal left leg   Sensory: Sensory testing shows reduced vibration and temperature sensations on the left arm and leg. Further reduction of sensation on her toes worse than ankles  Coordination: Cerebellar testing reveals good finger-nose-finger and reduced left heel-to-shin.  Gait and station: Station is normal.   Gait is wide and she inverts left foot with a mild left foot drop.  . Tandem gait is normal. Romberg is negative.   Reflexes: Deep tendon reflexes are symmetric and normal bilaterally.   Plantar responses are extensor on her left. 0 25 foot timed walk is 15.1 seconds    DIAGNOSTIC DATA (LABS, IMAGING, TESTING) - I reviewed patient records, labs, notes, testing and imaging myself where available.  Lab Results  Component Value Date   WBC 5.7 12/26/2016   HGB 13.2 12/26/2016   HCT 37.5 12/26/2016   MCV 85.4 12/26/2016   PLT 162 12/26/2016      Component Value Date/Time   NA 128 (L) 12/26/2016 1501   K 4.1 12/26/2016 1501   CL 95 (L) 12/26/2016 1501   CO2 26 12/26/2016 1501   GLUCOSE 114 (H) 12/26/2016 1501   BUN 16 12/26/2016 1501   CREATININE 0.60 12/26/2016 1501   CALCIUM 8.9 12/26/2016 1501   PROT 6.6 12/26/2016 1501   ALBUMIN 3.9 12/26/2016 1501   AST 41 12/26/2016 1501   ALT 24 12/26/2016 1501   ALKPHOS 74 12/26/2016 1501   BILITOT 0.4 12/26/2016 1501   GFRNONAA >60 12/26/2016  1501   GFRAA >60 12/26/2016 1501       ASSESSMENT AND PLAN  Myelopathy (HCC)  Multiple sclerosis, primary progressive (HCC)  Left foot drop  Pedal  edema  1.   We discussed ocrelizumab.   I do believe she has primary progressive MS and treatment would be an option for her. We discussed that in the truck studies there was a 24% improvement in progression of disability. She feels that with the benefit being mild that she would rather not go on the treatment at this time. We did discuss that if she does change her mind that I would want to do a lumbar puncture to be more certain of the diagnosis as presenting with late in life is unusual. 2.   She will contact [facility] for her left AFO. 3.   rtc 6 months or sooner if new or worsening neurologic issues.        Richard A. Felecia Shelling, MD, PhD, [facility]   06/19/2017, 3:30 PM Director of the [facility] Certified in Neurology, Clinical Neurophysiology, Sleep Medicine, Pain Medicine and [facility] [address], [place],  [address] [phone]

## 2017-07-09 DIAGNOSIS — H353132 Nonexudative age-related macular degeneration, bilateral, intermediate dry stage: Secondary | ICD-10-CM | POA: Diagnosis not present

## 2017-08-05 DIAGNOSIS — N3946 Mixed incontinence: Secondary | ICD-10-CM | POA: Diagnosis not present

## 2017-08-05 DIAGNOSIS — R350 Frequency of micturition: Secondary | ICD-10-CM | POA: Diagnosis not present

## 2017-08-08 DIAGNOSIS — H353132 Nonexudative age-related macular degeneration, bilateral, intermediate dry stage: Secondary | ICD-10-CM | POA: Diagnosis not present

## 2017-08-29 DIAGNOSIS — E871 Hypo-osmolality and hyponatremia: Secondary | ICD-10-CM | POA: Diagnosis not present

## 2017-08-29 DIAGNOSIS — E559 Vitamin D deficiency, unspecified: Secondary | ICD-10-CM | POA: Diagnosis not present

## 2017-08-29 DIAGNOSIS — R609 Edema, unspecified: Secondary | ICD-10-CM | POA: Diagnosis not present

## 2017-08-29 DIAGNOSIS — M810 Age-related osteoporosis without current pathological fracture: Secondary | ICD-10-CM | POA: Diagnosis not present

## 2017-08-29 DIAGNOSIS — Z23 Encounter for immunization: Secondary | ICD-10-CM | POA: Diagnosis not present

## 2017-08-29 DIAGNOSIS — G35 Multiple sclerosis: Secondary | ICD-10-CM | POA: Diagnosis not present

## 2017-08-29 DIAGNOSIS — Z1389 Encounter for screening for other disorder: Secondary | ICD-10-CM | POA: Diagnosis not present

## 2017-08-29 DIAGNOSIS — E039 Hypothyroidism, unspecified: Secondary | ICD-10-CM | POA: Diagnosis not present

## 2017-09-07 DIAGNOSIS — H353132 Nonexudative age-related macular degeneration, bilateral, intermediate dry stage: Secondary | ICD-10-CM | POA: Diagnosis not present

## 2017-09-08 ENCOUNTER — Ambulatory Visit (INDEPENDENT_AMBULATORY_CARE_PROVIDER_SITE_OTHER): Payer: Medicare Other | Admitting: Podiatry

## 2017-09-08 ENCOUNTER — Encounter: Payer: Self-pay | Admitting: Podiatry

## 2017-09-08 DIAGNOSIS — L84 Corns and callosities: Secondary | ICD-10-CM | POA: Diagnosis not present

## 2017-09-08 DIAGNOSIS — B351 Tinea unguium: Secondary | ICD-10-CM | POA: Diagnosis not present

## 2017-09-08 MED ORDER — NONFORMULARY OR COMPOUNDED ITEM: 3 refills | Status: DC

## 2017-09-08 NOTE — Progress Notes (Signed)
Patient ID: [name], female   DOB: 15-Jun-1932, 83 y.o.   MRN: [id number]   Subjective: This patient presents again complaining of painful corns on the distal toes right and left feet and request debridement of these lesions. Also, patient is complaining of some thickened toenails which is been soaking in vinegar. She is requesting debridement of these toenails as well Patient has a history of MS causing involuntary movements   Objective: Orientated 3  Vascular: Pitting edema left DP and PT pulses 1/4 bilaterally Capillary reflex within normal limits bilaterally  Neurological: Sensation to 10 g monofilament wire intact 3/5 bilaterally Vibratory sensation reactive bilaterally Ankle reflex weakly reactive bilaterally  Dermatological: No open skin lesions bilaterally Distal toes 2 and 3 after reactive keratoses with some bleeding within the callus bilaterally The distal toenails diffusely have some occasional texture and color changes, 6-10  Musculoskeletal: Spastic left lower extremity with left foot in a varus position Plantar flexion right 5/5 right and 3/5 left Dorsi flexion right 5/5 and 4/5 left Patient has an unsteady gait pattern and constant flexion of the toes during gait. She uses a cane for stability  Assessment: Gait disturbance associated with MS Spastic left lower extremity with muscle weakness left lower extremity Reactive callus distal toes right and left associated with patient's attempt to flex toes for stability walking Mycotic toenails 6-10  Plan: Debride distal corns right and left feet without any bleeding Debrided toenails 6-10 mechanically electrically without a bleeding Rx onychomycoses nail lacquer to apply topically the toenails once daily  Reappoint 3 months

## 2017-09-08 NOTE — Patient Instructions (Signed)
The compounding pharmacy will contact you and formulated the nail lacquer to apply to your fungal toenails once daily, 6-12 months

## 2017-10-07 DIAGNOSIS — H353132 Nonexudative age-related macular degeneration, bilateral, intermediate dry stage: Secondary | ICD-10-CM | POA: Diagnosis not present

## 2017-11-06 DIAGNOSIS — H353132 Nonexudative age-related macular degeneration, bilateral, intermediate dry stage: Secondary | ICD-10-CM | POA: Diagnosis not present

## 2017-12-06 DIAGNOSIS — H353132 Nonexudative age-related macular degeneration, bilateral, intermediate dry stage: Secondary | ICD-10-CM | POA: Diagnosis not present

## 2017-12-22 ENCOUNTER — Ambulatory Visit (INDEPENDENT_AMBULATORY_CARE_PROVIDER_SITE_OTHER): Payer: Medicare Other | Admitting: Neurology

## 2017-12-22 ENCOUNTER — Encounter: Payer: Self-pay | Admitting: Neurology

## 2017-12-22 ENCOUNTER — Encounter (INDEPENDENT_AMBULATORY_CARE_PROVIDER_SITE_OTHER): Payer: Self-pay

## 2017-12-22 ENCOUNTER — Ambulatory Visit: Payer: Medicare Other | Admitting: Neurology

## 2017-12-22 VITALS — BP 156/69 | HR 60 | Ht 65.0 in | Wt 128.5 lb

## 2017-12-22 DIAGNOSIS — R39198 Other difficulties with micturition: Secondary | ICD-10-CM

## 2017-12-22 DIAGNOSIS — G35 Multiple sclerosis: Secondary | ICD-10-CM

## 2017-12-22 DIAGNOSIS — G959 Disease of spinal cord, unspecified: Secondary | ICD-10-CM

## 2017-12-22 DIAGNOSIS — M21372 Foot drop, left foot: Secondary | ICD-10-CM | POA: Diagnosis not present

## 2017-12-22 NOTE — Progress Notes (Signed)
[facility]  PATIENT: [name] DOB: Sep 03, 1932  REFERRING DOCTOR OR PCP:  [name] SOURCE:   Patient, notes from Dr. Marisue Humble and Dr. Catalina Gravel, imaging results, MRI images on PACS.  _________________________________   HISTORICAL  CHIEF COMPLAINT:  Chief Complaint  Patient presents with  . Multiple Sclerosis    HISTORY OF PRESENT ILLNESS:  [name] is an 84 year old woman Who appears to have primary progressive multiple sclerosis.   Update 12/22/2017:    She notes notes her walking is doing worse.   She uses a cane.  She has a walker but does not use it much (only if going to be on her feet a longer time.    She never got the brace as she can't wear closed toe shoes.   She has no recent fall but stumbles a lot.     She feels she is moving more slowly.   She always feels more steady in the house if she touches the walls or furniture (even if not holding on).  Her left leg does worse than her right.    Bladder is doing worse.  She has both frequency and hesitancy.   She will sometimes have incontinence in the bathroom but not outside.     She stopped Myrbetriq due to constipation and elevated BP.   However, she takes it if going to be out of the house a while.    Vision is stable.   She had visual changes from temporal arteritis in 2004 and took prednisone x years.   She notes fatigue and gait is worse when more tired.   She has had mild depression but this is stable.    She is very active with her church and the [employer].   She sleeps well many nights but other nights wake sup around 230-300 and stays awake.     From 7/122018:  Gait/strength/sensation: She has a long history of difficulties with her gait. The left side is worse than the right side and her legs are much weaker than the arms (only minimal left weakness).    Gait has been worse with the recent heat.   She feels balance has worsened more this year.    We have tried to get her an AFO but there  has been some issues getting in contact with [employer].     Bladder:   She has bladder frequency and hesitancy.  Myrbetriq has helped the frequency but she has some more hesitancy and also notes more constipation.     She has no recent UTI.   She has hesitancy several times at night and has urinary frequency and urgency with incontinence a couple times daily.     Vision:    Her vision was affected by temporal arteritis (2004) and she was on prednisone a year.   She also has macular degeneration.    She has a visual field at home machine and uses it once daily.    Mood:   She has noted mild depression after the death of her husband.     She is active and goes to the [employer] most days and church activities 3-4 times a week including choir.   She has had some stress with her husband of 71 years dying 7-8 years ago and her second husband dying more recently.  Fatigue:    She notes her fatigue is much worse with heat, physically and cognitively.  Sometimes she needs a nap due to fatigfue and  sleepiness.   She sleeps well most nights.      MS History:   She has had difficulty with her gait for many years. Her left leg seemed clumsy at least 10-15 years ago.   Her left toes would drag at times and her balance was poor.    About 2011, she fell at church and broke her pelvis tripping over a gown.   Her gait continues to worsen.   Her right leg does well.   Her left arm is slightly clumsy and slightly weaker compared to her right.     All of her changes have been gradual.     She denies any numbness.   In 2014, she had MRIs showing many T2/FLAIR hyperintense foci in the brain. At her age,  the pattern was felt to be nonspecific. However, MRI of the cervical and thoracic spine showed adjacent to C2 to the right and a plaque adjacent to T6 to the left  I have reviewed the MRIs of her brain and spine in 11/11/2013.   The MRI of the cervical spine shows a focus to the right at C2 and another focus to the left at C6. It also  shows multilevel degenerative changes with anterolisthesis of C4 upon C5 but no nerve root compression.  No spinal cord compression.  The MRI of the thoracic spine shows a focus to the left adjacent to T6. It also showed a chronic T9 compression fracture (she was never aware of this).  MRI of the brain shows many T2/FLAIR hyperintense foci.  Foci are non-specific though many of the foci are radially oriented to the ventricles in the periventricular white matter. Some foci in the pons have an appearance more typical for chronic microvascular ischemic change. MRI 2014 showed a chronic compression fracture at T9 and she can not think of no other episode of significant back pain.  Her one paternal first cousin and 2 maternal cousins have MS.    She does not know whether they had PPMS or RRMS.    REVIEW OF SYSTEMS: Constitutional: No fevers, chills, sweats, or change in appetite.   Some fatigue.   Occ insomnia (sleep maintenance) Eyes: No visual changes, double vision, eye pain Ear, nose and throat: No hearing loss, ear pain, nasal congestion, sore throat Cardiovascular: No chest pain, palpitations Respiratory: No shortness of breath at rest or with exertion.   No wheezes.    GastrointestinaI: No nausea, vomiting, diarrhea, abdominal pain, fecal incontinence Genitourinary: see above.  .    Musculoskeletal: No current neck pain, back pain Integumentary: No rash, pruritus, skin lesions Neurological: as above Psychiatric: Mild depression but no anxiety Endocrine: No palpitations, diaphoresis, change in appetite, change in weigh or increased thirst Hematologic/Lymphatic: No anemia, purpura, petechiae. Allergic/Immunologic: No itchy/runny eyes, nasal congestion, recent allergic reactions, rashes  ALLERGIES: Allergies  Allergen Reactions  . Aspirin Nausea Only  . Penicillins Diarrhea  . Sulfa Antibiotics     HOME MEDICATIONS:  Current Outpatient Medications:  .  Ascorbic Acid (VITAMIN C) 100  MG tablet, Take 200 mg by mouth 2 (two) times daily. , Disp: , Rfl:  .  azelastine (ASTELIN) 137 MCG/SPRAY nasal spray, Place 1 spray into the nose 2 (two) times daily as needed for allergies. Use in each nostril as directed , Disp: , Rfl:  .  Biotin 1 MG CAPS, Take 1 capsule by mouth daily. , Disp: , Rfl:  .  Calcium Carbonate-Vitamin D (CALCIUM + D PO), Take 1 tablet by  mouth 2 (two) times daily. , Disp: , Rfl:  .  levothyroxine (SYNTHROID, LEVOTHROID) 25 MCG tablet, Take 25 mcg by mouth daily., Disp: , Rfl:  .  Magnesium 500 MG TABS, Take 0.5 tablets by mouth every evening. , Disp: , Rfl:  .  mirabegron ER (MYRBETRIQ) 25 MG TB24 tablet, Take 25 mg by mouth daily as needed. , Disp: , Rfl:  .  Multiple Vitamins-Minerals (ICAPS AREDS FORMULA PO), Take by mouth., Disp: , Rfl:  .  NONFORMULARY OR COMPOUNDED ITEM, [facility]  Onychomycosis Nail Lacquer -  Fluconazole 2%, Terbinafine 1% DMSO Apply to affected nail once daily Qty. 120 gm 3 refills, Disp: 120 each, Rfl: 3 .  triamcinolone cream (KENALOG) 0.1 %, Apply topically as needed., Disp: 30 g, Rfl: 4 .  Vitamin D, Ergocalciferol, (DRISDOL) 50000 UNITS CAPS capsule, Take 1 capsule (50,000 Units total) by mouth every 7 (seven) days., Disp: 30 capsule, Rfl: 3 .  vitamin E 100 UNIT capsule, Take 100 Units by mouth daily., Disp: , Rfl:   PAST MEDICAL HISTORY: Past Medical History:  Diagnosis Date  . Incontinent of urine   . Multiple sclerosis ([place]) 2015  . Osteoporosis    osteopenia  . Post-menopausal     PAST SURGICAL HISTORY: Past Surgical History:  Procedure Laterality Date  . ABDOMINAL HYSTERECTOMY  1971   TVH  . breast ductectomy Right 05/1995  . COLONOSCOPY  10/2011    FAMILY HISTORY: Family History  Problem Relation Age of Onset  . Osteoarthritis Father   . Stroke Father   . Heart failure Mother   . Hypertension Mother   . Heart disease Mother   . Multiple sclerosis Cousin   . Multiple sclerosis Cousin   . Multiple  sclerosis Cousin     SOCIAL HISTORY:  Social History   Socioeconomic History  . Marital status: Widowed    Spouse name: Not on file  . Number of children: Not on file  . Years of education: Not on file  . Highest education level: Not on file  Social Needs  . Financial resource strain: Not on file  . Food insecurity - worry: Not on file  . Food insecurity - inability: Not on file  . Transportation needs - medical: Not on file  . Transportation needs - non-medical: Not on file  Occupational History  . Not on file  Tobacco Use  . Smoking status: Never Smoker  . Smokeless tobacco: Never Used  Substance and Sexual Activity  . Alcohol use: No  . Drug use: No  . Sexual activity: Not Currently    Partners: Male    Birth control/protection: Post-menopausal, Surgical  Other Topics Concern  . Not on file  Social History Narrative  . Not on file     PHYSICAL EXAM  Vitals:   12/22/17 1527  BP: (!) 156/69  Pulse: 60  Weight: 128 lb 8 oz (58.3 kg)  Height: 5\' 5"  (1.651 m)    Body mass index is 21.38 kg/m.   General: The patient is well-developed and well-nourished and in no acute distress   Skin: Extremities show no rash and left > right pedal edema.  Musculoskeletal:  Back is nontender  Neurologic Exam  Mental status: The patient is alert and oriented x 3 at the time of the examination. The patient has apparent normal recent and remote memory, with an apparently normal attention span and concentration ability.   Speech is normal.  Cranial nerves: Extraocular movements are full. The facial  strength and facial sensation are normal. Trapezius strength is normal.  The tongue is midline, and the patient has symmetric elevation of the soft palate. No obvious hearing deficits are noted.  Motor:  Muscle bulk is normal.   Tone is increased in the legs, left > right.   Strength is 5/5 in the arms (but reduced left RAM) and 5/5 in right leg and proximal left leg and 4/5 distal  left leg   Sensory: Sensory testing shows reduced vibration and temperature sensations on the left leg worse than left arm. Sensation is normal on the right. Further reduction of sensation on her toes worse than ankles  Coordination: Cerebellar testing reveals good finger-nose-finger and reduced left heel-to-shin.  Gait and station: Station is normal.   Gait is wide and she inverts left foot with a mild left foot drop.  . Tandem gait is normal. Romberg is negative.   Reflexes: Deep tendon reflexes are symmetric and normal bilaterally.   Plantar responses are extensor on her left. 0 25 foot timed walk is 15.1 seconds    DIAGNOSTIC DATA (LABS, IMAGING, TESTING) - I reviewed patient records, labs, notes, testing and imaging myself where available.  Lab Results  Component Value Date   WBC 5.7 12/26/2016   HGB 13.2 12/26/2016   HCT 37.5 12/26/2016   MCV 85.4 12/26/2016   PLT 162 12/26/2016      Component Value Date/Time   NA 128 (L) 12/26/2016 1501   K 4.1 12/26/2016 1501   CL 95 (L) 12/26/2016 1501   CO2 26 12/26/2016 1501   GLUCOSE 114 (H) 12/26/2016 1501   BUN 16 12/26/2016 1501   CREATININE 0.60 12/26/2016 1501   CALCIUM 8.9 12/26/2016 1501   PROT 6.6 12/26/2016 1501   ALBUMIN 3.9 12/26/2016 1501   AST 41 12/26/2016 1501   ALT 24 12/26/2016 1501   ALKPHOS 74 12/26/2016 1501   BILITOT 0.4 12/26/2016 1501   GFRNONAA >60 12/26/2016 1501   GFRAA >60 12/26/2016 1501       ASSESSMENT AND PLAN  Primary chronic progressive multiple sclerosis (HCC)  Myelopathy (HCC)  Left foot drop  Urinary dysfunction   1.   She has not been interested in ocrelizumab for PPMS as the benefit in studies was mild 2.   Stay active,  Use cane or walker 3.   rtc 12 months or sooner if new or worsening neurologic issues.        Richard A. Felecia Shelling, MD, PhD, Charlynn Grimes   12/22/2017, 4:43 PM Director of the [place] at [facility] Certified in Neurology, [place]  [facility], Sleep Medicine, Pain Medicine and [facility] [address], [place], [place] [address] [phone]

## 2017-12-27 DIAGNOSIS — R040 Epistaxis: Secondary | ICD-10-CM | POA: Diagnosis not present

## 2017-12-29 DIAGNOSIS — R040 Epistaxis: Secondary | ICD-10-CM | POA: Diagnosis not present

## 2017-12-29 DIAGNOSIS — R030 Elevated blood-pressure reading, without diagnosis of hypertension: Secondary | ICD-10-CM | POA: Diagnosis not present

## 2017-12-30 DIAGNOSIS — H353132 Nonexudative age-related macular degeneration, bilateral, intermediate dry stage: Secondary | ICD-10-CM | POA: Diagnosis not present

## 2017-12-30 DIAGNOSIS — H35371 Puckering of macula, right eye: Secondary | ICD-10-CM | POA: Diagnosis not present

## 2017-12-30 DIAGNOSIS — H43813 Vitreous degeneration, bilateral: Secondary | ICD-10-CM | POA: Diagnosis not present

## 2017-12-31 DIAGNOSIS — R040 Epistaxis: Secondary | ICD-10-CM | POA: Diagnosis not present

## 2018-01-01 DIAGNOSIS — R030 Elevated blood-pressure reading, without diagnosis of hypertension: Secondary | ICD-10-CM | POA: Diagnosis not present

## 2018-01-01 DIAGNOSIS — R040 Epistaxis: Secondary | ICD-10-CM | POA: Diagnosis not present

## 2018-01-05 DIAGNOSIS — H353132 Nonexudative age-related macular degeneration, bilateral, intermediate dry stage: Secondary | ICD-10-CM | POA: Diagnosis not present

## 2018-01-12 ENCOUNTER — Ambulatory Visit: Payer: Medicare Other | Admitting: Podiatry

## 2018-01-14 ENCOUNTER — Ambulatory Visit (INDEPENDENT_AMBULATORY_CARE_PROVIDER_SITE_OTHER): Payer: Medicare Other | Admitting: Podiatry

## 2018-01-14 ENCOUNTER — Encounter: Payer: Self-pay | Admitting: Podiatry

## 2018-01-14 DIAGNOSIS — M79674 Pain in right toe(s): Secondary | ICD-10-CM

## 2018-01-14 DIAGNOSIS — M79675 Pain in left toe(s): Secondary | ICD-10-CM

## 2018-01-14 DIAGNOSIS — B351 Tinea unguium: Secondary | ICD-10-CM | POA: Diagnosis not present

## 2018-01-14 DIAGNOSIS — L84 Corns and callosities: Secondary | ICD-10-CM

## 2018-01-14 NOTE — Progress Notes (Signed)
Subjective:   Patient ID: [name], female   DOB: 84 y.o.   MRN: [id number]   HPI Patient presents with severe nail disease and lesion formation both feet that become painful and make it hard for her to walk with history of MS that makes it impossible for her to cut her nails or corns   ROS      Objective:  Physical Exam  Neurovascular status intact with thick yellow brittle nailbeds 1-5 both feet with lesions bilateral that are painful when palpated     Assessment:  Significant structural malalignment with digital deformity keratotic lesion and mycotic nail infection     Plan:  H&P all conditions reviewed and debridement of painful nailbeds and lesions accomplished bilateral with no iatrogenic bleeding.  Patient will be seen back in 3 months or earlier if needed

## 2018-02-04 DIAGNOSIS — H353132 Nonexudative age-related macular degeneration, bilateral, intermediate dry stage: Secondary | ICD-10-CM | POA: Diagnosis not present

## 2018-02-23 DIAGNOSIS — E039 Hypothyroidism, unspecified: Secondary | ICD-10-CM | POA: Diagnosis not present

## 2018-02-23 DIAGNOSIS — M810 Age-related osteoporosis without current pathological fracture: Secondary | ICD-10-CM | POA: Diagnosis not present

## 2018-02-23 DIAGNOSIS — M8588 Other specified disorders of bone density and structure, other site: Secondary | ICD-10-CM | POA: Diagnosis not present

## 2018-02-23 DIAGNOSIS — E559 Vitamin D deficiency, unspecified: Secondary | ICD-10-CM | POA: Diagnosis not present

## 2018-02-27 DIAGNOSIS — E871 Hypo-osmolality and hyponatremia: Secondary | ICD-10-CM | POA: Diagnosis not present

## 2018-02-27 DIAGNOSIS — M810 Age-related osteoporosis without current pathological fracture: Secondary | ICD-10-CM | POA: Diagnosis not present

## 2018-02-27 DIAGNOSIS — R609 Edema, unspecified: Secondary | ICD-10-CM | POA: Diagnosis not present

## 2018-02-27 DIAGNOSIS — E559 Vitamin D deficiency, unspecified: Secondary | ICD-10-CM | POA: Diagnosis not present

## 2018-02-27 DIAGNOSIS — G35 Multiple sclerosis: Secondary | ICD-10-CM | POA: Diagnosis not present

## 2018-02-27 DIAGNOSIS — E039 Hypothyroidism, unspecified: Secondary | ICD-10-CM | POA: Diagnosis not present

## 2018-03-06 DIAGNOSIS — H353132 Nonexudative age-related macular degeneration, bilateral, intermediate dry stage: Secondary | ICD-10-CM | POA: Diagnosis not present

## 2018-03-20 ENCOUNTER — Telehealth: Payer: Self-pay | Admitting: Podiatry

## 2018-03-20 NOTE — Telephone Encounter (Signed)
Pt called and wanted to get another refill on the compound Dr.Tuchman prescribed to her back in 2018. Her number is [phone]

## 2018-03-20 NOTE — Telephone Encounter (Signed)
I informed pt Dr. Amalia Hailey had retired Dr. Paulla Dolly had okayed the refills but if she did not see any improvement she would need to see another doctor. Pt states understanding and she is already established with Dr. Paulla Dolly.

## 2018-04-05 DIAGNOSIS — H353132 Nonexudative age-related macular degeneration, bilateral, intermediate dry stage: Secondary | ICD-10-CM | POA: Diagnosis not present

## 2018-04-13 ENCOUNTER — Ambulatory Visit (INDEPENDENT_AMBULATORY_CARE_PROVIDER_SITE_OTHER): Payer: Medicare Other | Admitting: Podiatry

## 2018-04-13 ENCOUNTER — Encounter: Payer: Self-pay | Admitting: Podiatry

## 2018-04-13 DIAGNOSIS — B351 Tinea unguium: Secondary | ICD-10-CM

## 2018-04-13 DIAGNOSIS — M79674 Pain in right toe(s): Secondary | ICD-10-CM | POA: Diagnosis not present

## 2018-04-13 DIAGNOSIS — M79675 Pain in left toe(s): Secondary | ICD-10-CM | POA: Diagnosis not present

## 2018-04-13 DIAGNOSIS — L84 Corns and callosities: Secondary | ICD-10-CM | POA: Diagnosis not present

## 2018-04-15 NOTE — Progress Notes (Signed)
Subjective:   Patient ID: [name], female   DOB: 84 y.o.   MRN: [id number]   HPI Patient presents with nail disease 1-5 both feet that she cannot take care of and lesions on both feet that are tender   ROS      Objective:  Physical Exam  Neurovascular status intact with patient noted to have thick yellow brittle nailbeds 1-5 both feet that are painful when pressed and is noted to have lesions plantar aspect both feet that are painful upon debridement and painful with pressure     Assessment:  Chronic mycotic nail infection 1-5 both feet with lesion formation bilateral     Plan:  Debrided nailbeds 1-5 both feet and lesions bilateral with no iatrogenic bleeding and reappoint for routine care or earlier if any issues should occur

## 2018-05-06 DIAGNOSIS — Z1231 Encounter for screening mammogram for malignant neoplasm of breast: Secondary | ICD-10-CM | POA: Diagnosis not present

## 2018-05-19 ENCOUNTER — Encounter: Payer: Self-pay | Admitting: Obstetrics & Gynecology

## 2018-06-04 DIAGNOSIS — H353132 Nonexudative age-related macular degeneration, bilateral, intermediate dry stage: Secondary | ICD-10-CM | POA: Diagnosis not present

## 2018-06-19 DIAGNOSIS — H353131 Nonexudative age-related macular degeneration, bilateral, early dry stage: Secondary | ICD-10-CM | POA: Diagnosis not present

## 2018-06-19 DIAGNOSIS — Z961 Presence of intraocular lens: Secondary | ICD-10-CM | POA: Diagnosis not present

## 2018-07-04 DIAGNOSIS — H353132 Nonexudative age-related macular degeneration, bilateral, intermediate dry stage: Secondary | ICD-10-CM | POA: Diagnosis not present

## 2018-07-15 ENCOUNTER — Ambulatory Visit (INDEPENDENT_AMBULATORY_CARE_PROVIDER_SITE_OTHER): Payer: Medicare Other | Admitting: Podiatry

## 2018-07-15 DIAGNOSIS — M79675 Pain in left toe(s): Secondary | ICD-10-CM | POA: Diagnosis not present

## 2018-07-15 DIAGNOSIS — M79674 Pain in right toe(s): Secondary | ICD-10-CM | POA: Diagnosis not present

## 2018-07-15 DIAGNOSIS — L84 Corns and callosities: Secondary | ICD-10-CM

## 2018-07-15 DIAGNOSIS — B351 Tinea unguium: Secondary | ICD-10-CM | POA: Diagnosis not present

## 2018-07-16 NOTE — Progress Notes (Signed)
Subjective:   Patient ID: [name], female   DOB: 84 y.o.   MRN: [id number]   HPI Patient presents with nail disease 1-5 both feet that are thick yellow brittle and he cannot cut with pain.  Also noted to have painful corn callus formation of the digits and sub-first metatarsal bilateral   ROS      Objective:  Physical Exam  Neurovascular status unchanged with thick yellow brittle nailbeds 1-5 both feet with pain.  Lesions noted bilateral that are painful     Assessment:  Mycotic nail infection with pain 1-5 both feet with keratotic lesion formation bilateral     Plan:  Debrided nailbeds 1-5 both feet and lesions bilateral with no iatrogenic bleeding and reappoint for routine care

## 2018-08-03 DIAGNOSIS — H353132 Nonexudative age-related macular degeneration, bilateral, intermediate dry stage: Secondary | ICD-10-CM | POA: Diagnosis not present

## 2018-08-25 ENCOUNTER — Ambulatory Visit (INDEPENDENT_AMBULATORY_CARE_PROVIDER_SITE_OTHER): Payer: Medicare Other | Admitting: Orthopedic Surgery

## 2018-08-25 ENCOUNTER — Ambulatory Visit (INDEPENDENT_AMBULATORY_CARE_PROVIDER_SITE_OTHER): Payer: Medicare Other

## 2018-08-25 ENCOUNTER — Encounter (INDEPENDENT_AMBULATORY_CARE_PROVIDER_SITE_OTHER): Payer: Self-pay | Admitting: Orthopedic Surgery

## 2018-08-25 VITALS — Ht 65.0 in | Wt 128.5 lb

## 2018-08-25 DIAGNOSIS — G35 Multiple sclerosis: Secondary | ICD-10-CM | POA: Diagnosis not present

## 2018-08-25 DIAGNOSIS — M533 Sacrococcygeal disorders, not elsewhere classified: Secondary | ICD-10-CM

## 2018-08-25 DIAGNOSIS — M25552 Pain in left hip: Secondary | ICD-10-CM | POA: Diagnosis not present

## 2018-08-25 DIAGNOSIS — M25551 Pain in right hip: Secondary | ICD-10-CM

## 2018-08-25 MED ORDER — PREDNISONE 10 MG PO TABS: 20.0000 mg | ORAL_TABLET | Freq: Every day | ORAL | 0 refills | Status: DC

## 2018-08-25 NOTE — Progress Notes (Signed)
Office Visit Note   Patient: [name]           Date of Birth: 1932-07-13           MRN: [id number] Visit Date: 08/25/2018              Requested by: [name], MD [address] [place], [place] [address] PCP: [name], MD  Chief Complaint  Patient presents with  . Right Hip - Pain  . Left Hip - Pain      HPI: Patient is an 84 year old woman who is seen for evaluation of bilateral sacroiliac pain.  Patient was last seen in 2015 she had an unsteady gait she was referred to neurology the work-up with neurology was positive for multiple sclerosis.  Patient states she is currently not taking any medications but still has an unsteady gait.  She states she exercises daily she does not take any pain medication.  Patient states she is taken 100 mg of prednisone a day in the past for temporal arteritis.  Assessment & Plan: Visit Diagnoses:  1. Pain of both hip joints   2. SI (sacroiliac) pain   3. MS (multiple sclerosis) ([place])     Plan: Discussed her supplements recommend that she have her magnesium checked with her next blood level since she is taking the magnesium supplement and this has not been checked.  Will call in a low-dose prednisone at 20 mg with breakfast she will wean off to 10 mg as her symptoms resolve and then take 10 mg every other day to wean off.  Also recommended coconut water for her muscle cramping that she has at night.  Follow-Up Instructions: Return if symptoms worsen or fail to improve.   Ortho Exam  Patient is alert, oriented, no adenopathy, well-dressed, normal affect, normal respiratory effort. Examination patient does have an antalgic unsteady gait she uses assistance to get up from a sitting position she uses a cane in the right hand.  She has good range of motion of both hips there is no pain with range of motion of her hips.  She has pitting edema in both lower extremities worse on the left than the right and patient  states she cannot get compression stockings on due to the weakness in her hands.  There are no venous ulcers.  She has a negative straight leg raise bilaterally.  No sciatic symptoms.  Imaging: Xr Hips Bilat W Or W/o Pelvis 3-4 Views  Result Date: 08/25/2018 2 view radiographs of both hips shows a stable pubic rami fracture on the left.  Patient does have sclerosis of the SI joints.  There is mild arthritic changes of both hips.    No images are attached to the encounter.  Labs: Lab Results  Component Value Date   ESRSEDRATE 108 (H) 04/03/2010     Lab Results  Component Value Date   ALBUMIN 3.9 12/26/2016   ALBUMIN 3.8 03/12/2009    Body mass index is 21.38 kg/m.  Orders:  Orders Placed This Encounter  Procedures  . XR HIPS BILAT W OR W/O PELVIS 3-4 VIEWS   No orders of the defined types were placed in this encounter.    Procedures: No procedures performed  Clinical Data: No additional findings.  ROS:  All other systems negative, except as noted in the HPI. Review of Systems  Objective: Vital Signs: Ht 5\' 5"  (1.651 m)   Wt 128 lb 8 oz (58.3 kg)   LMP  09/08/1970 (Approximate)   BMI 21.38 kg/m   Specialty Comments:  No specialty comments available.  PMFS History: Patient Active Problem List   Diagnosis Date Noted  . Urinary dysfunction 12/22/2017  . Multiple sclerosis, primary progressive ([place]) 06/19/2017  . Left foot drop 03/17/2017  . Primary chronic progressive multiple sclerosis ([place]) 03/17/2017  . Myelopathy ([place]) 03/17/2017   Past Medical History:  Diagnosis Date  . Incontinent of urine   . Multiple sclerosis ([place]) 2015  . Osteoporosis    osteopenia  . Post-menopausal     Family History  Problem Relation Age of Onset  . Osteoarthritis Father   . Stroke Father   . Heart failure Mother   . Hypertension Mother   . Heart disease Mother   . Multiple sclerosis Cousin   . Multiple sclerosis Cousin   . Multiple sclerosis Cousin     Past  Surgical History:  Procedure Laterality Date  . ABDOMINAL HYSTERECTOMY  1971   TVH  . breast ductectomy Right 05/1995  . COLONOSCOPY  10/2011   Social History   Occupational History  . Not on file  Tobacco Use  . Smoking status: Never Smoker  . Smokeless tobacco: Never Used  Substance and Sexual Activity  . Alcohol use: No  . Drug use: No  . Sexual activity: Not Currently    Partners: Male    Birth control/protection: Post-menopausal, Surgical

## 2018-08-27 DIAGNOSIS — L905 Scar conditions and fibrosis of skin: Secondary | ICD-10-CM | POA: Diagnosis not present

## 2018-08-27 DIAGNOSIS — Z85828 Personal history of other malignant neoplasm of skin: Secondary | ICD-10-CM | POA: Diagnosis not present

## 2018-08-27 DIAGNOSIS — L820 Inflamed seborrheic keratosis: Secondary | ICD-10-CM | POA: Diagnosis not present

## 2018-08-27 DIAGNOSIS — D485 Neoplasm of uncertain behavior of skin: Secondary | ICD-10-CM | POA: Diagnosis not present

## 2018-09-02 DIAGNOSIS — H353132 Nonexudative age-related macular degeneration, bilateral, intermediate dry stage: Secondary | ICD-10-CM | POA: Diagnosis not present

## 2018-09-04 DIAGNOSIS — Z23 Encounter for immunization: Secondary | ICD-10-CM | POA: Diagnosis not present

## 2018-09-04 DIAGNOSIS — M810 Age-related osteoporosis without current pathological fracture: Secondary | ICD-10-CM | POA: Diagnosis not present

## 2018-09-04 DIAGNOSIS — R609 Edema, unspecified: Secondary | ICD-10-CM | POA: Diagnosis not present

## 2018-09-04 DIAGNOSIS — G35 Multiple sclerosis: Secondary | ICD-10-CM | POA: Diagnosis not present

## 2018-09-04 DIAGNOSIS — E871 Hypo-osmolality and hyponatremia: Secondary | ICD-10-CM | POA: Diagnosis not present

## 2018-09-04 DIAGNOSIS — E559 Vitamin D deficiency, unspecified: Secondary | ICD-10-CM | POA: Diagnosis not present

## 2018-09-04 DIAGNOSIS — H9190 Unspecified hearing loss, unspecified ear: Secondary | ICD-10-CM | POA: Diagnosis not present

## 2018-09-04 DIAGNOSIS — D692 Other nonthrombocytopenic purpura: Secondary | ICD-10-CM | POA: Diagnosis not present

## 2018-09-04 DIAGNOSIS — E039 Hypothyroidism, unspecified: Secondary | ICD-10-CM | POA: Diagnosis not present

## 2018-09-21 DIAGNOSIS — R42 Dizziness and giddiness: Secondary | ICD-10-CM | POA: Diagnosis not present

## 2018-10-02 DIAGNOSIS — H353132 Nonexudative age-related macular degeneration, bilateral, intermediate dry stage: Secondary | ICD-10-CM | POA: Diagnosis not present

## 2018-10-14 ENCOUNTER — Ambulatory Visit (INDEPENDENT_AMBULATORY_CARE_PROVIDER_SITE_OTHER): Payer: Medicare Other | Admitting: Podiatry

## 2018-10-14 ENCOUNTER — Encounter: Payer: Self-pay | Admitting: Podiatry

## 2018-10-14 DIAGNOSIS — M79674 Pain in right toe(s): Secondary | ICD-10-CM | POA: Diagnosis not present

## 2018-10-14 DIAGNOSIS — B351 Tinea unguium: Secondary | ICD-10-CM | POA: Diagnosis not present

## 2018-10-14 DIAGNOSIS — L84 Corns and callosities: Secondary | ICD-10-CM

## 2018-10-14 DIAGNOSIS — M79675 Pain in left toe(s): Secondary | ICD-10-CM | POA: Diagnosis not present

## 2018-10-14 NOTE — Progress Notes (Signed)
Subjective:   Patient ID: [name], female   DOB: 84 y.o.   MRN: [id number]   HPI Patient presents with elongated nailbeds 1-5 both feet and lesions distal second and third digits bilateral that are painful   ROS      Objective:  Physical Exam  Neurovascular status unchanged with thick yellow brittle nails and lesion formation bilateral     Assessment:  Mycotic nail infection with pain and lesions formation bilateral     Plan:  Debrided nailbeds lesions bilateral no iatrogenic bleeding and reappoint for routine care

## 2018-10-20 ENCOUNTER — Encounter (INDEPENDENT_AMBULATORY_CARE_PROVIDER_SITE_OTHER): Payer: Self-pay | Admitting: Orthopedic Surgery

## 2018-10-20 ENCOUNTER — Ambulatory Visit (INDEPENDENT_AMBULATORY_CARE_PROVIDER_SITE_OTHER): Payer: Medicare Other | Admitting: Orthopedic Surgery

## 2018-10-20 VITALS — Ht 65.0 in | Wt 128.5 lb

## 2018-10-20 DIAGNOSIS — G35 Multiple sclerosis: Secondary | ICD-10-CM

## 2018-10-20 DIAGNOSIS — M25551 Pain in right hip: Secondary | ICD-10-CM | POA: Diagnosis not present

## 2018-10-20 DIAGNOSIS — M25552 Pain in left hip: Secondary | ICD-10-CM

## 2018-10-20 DIAGNOSIS — M533 Sacrococcygeal disorders, not elsewhere classified: Secondary | ICD-10-CM | POA: Diagnosis not present

## 2018-10-20 NOTE — Progress Notes (Signed)
Office Visit Note   Patient: [name]           Date of Birth: 11-Jun-1932           MRN: [id number] Visit Date: 10/20/2018              Requested by: [name], MD [address] [place], [place] [address] PCP: [name], MD  Chief Complaint  Patient presents with  . Left Hip - Follow-up, Pain    Fell 2 weeks ago on buttocks  . Right Leg - Follow-up, Pain      HPI: Patient is a 84 year old woman who was previously seen for back and sacroiliac joint pain.  She did take prednisone for several days and this did help her symptoms.  Patient states that most recently about 2 weeks ago she has been having some episodes of vertigo.  She states that one morning she fell backwards landing on her sacroiliac joints.  She complains of increasing pain primarily over the SI joints denies any radicular pain or back pain.  Assessment & Plan: Visit Diagnoses:  1. Pain of both hip joints   2. SI (sacroiliac) pain   3. MS (multiple sclerosis) ([place])     Plan: Patient is reluctant to take prednisone for the symptoms she would like to consider a steroid injection we will schedule appoint with Dr. Ernestina Patches for bilateral SI joint injections.  Recommended a trial of CBD drops to help as a anti-inflammatory also discussed coconut water to help with her muscle cramps.  Follow-Up Instructions: Return if symptoms worsen or fail to improve.   Ortho Exam  Patient is alert, oriented, no adenopathy, well-dressed, normal affect, normal respiratory effort. Examination patient has difficulty getting from a sitting to a standing position she needs assistance for ambulation she also uses a cane.  She has a negative straight leg raise bilaterally she is point tender to palpation of the SI joints bilaterally the lumbar spine is nontender to palpation she has no focal motor weakness.  She does have venous stasis swelling in both feet and legs worse on the left leg than the right leg.   There are no open ulcers.  Imaging: No results found. No images are attached to the encounter.  Labs: Lab Results  Component Value Date   ESRSEDRATE 108 (H) 04/03/2010     Lab Results  Component Value Date   ALBUMIN 3.9 12/26/2016   ALBUMIN 3.8 03/12/2009    Body mass index is 21.38 kg/m.  Orders:  Orders Placed This Encounter  Procedures  . Ambulatory referral to Physical Medicine Rehab   No orders of the defined types were placed in this encounter.    Procedures: No procedures performed  Clinical Data: No additional findings.  ROS:  All other systems negative, except as noted in the HPI. Review of Systems  Objective: Vital Signs: Ht 5\' 5"  (1.651 m)   Wt 128 lb 8 oz (58.3 kg)   LMP 09/08/1970 (Approximate)   BMI 21.38 kg/m   Specialty Comments:  No specialty comments available.  PMFS History: Patient Active Problem List   Diagnosis Date Noted  . Urinary dysfunction 12/22/2017  . Multiple sclerosis, primary progressive ([place]) 06/19/2017  . Left foot drop 03/17/2017  . Primary chronic progressive multiple sclerosis ([place]) 03/17/2017  . Myelopathy ([place]) 03/17/2017   Past Medical History:  Diagnosis Date  . Incontinent of urine   . Multiple sclerosis ([place]) 2015  . Osteoporosis  osteopenia  . Post-menopausal     Family History  Problem Relation Age of Onset  . Osteoarthritis Father   . Stroke Father   . Heart failure Mother   . Hypertension Mother   . Heart disease Mother   . Multiple sclerosis Cousin   . Multiple sclerosis Cousin   . Multiple sclerosis Cousin     Past Surgical History:  Procedure Laterality Date  . ABDOMINAL HYSTERECTOMY  1971   TVH  . breast ductectomy Right 05/1995  . COLONOSCOPY  10/2011   Social History   Occupational History  . Not on file  Tobacco Use  . Smoking status: Never Smoker  . Smokeless tobacco: Never Used  Substance and Sexual Activity  . Alcohol use: No  . Drug use: No  . Sexual activity: Not  Currently    Partners: Male    Birth control/protection: Post-menopausal, Surgical

## 2018-11-01 DIAGNOSIS — H353132 Nonexudative age-related macular degeneration, bilateral, intermediate dry stage: Secondary | ICD-10-CM | POA: Diagnosis not present

## 2018-11-11 ENCOUNTER — Telehealth: Payer: Self-pay | Admitting: Obstetrics & Gynecology

## 2018-11-11 NOTE — Telephone Encounter (Signed)
Patient called and stated that she has MS and has been experiencing incontinence. Patient stated that she has been "having a time" with yeast. Patient stated that she believes it is vaginally and rectally. Patient stated that she does not have a choice whether to wear the pad or not due to incontinence and is requesting anything to help with the discomfort.

## 2018-11-11 NOTE — Telephone Encounter (Signed)
Spoke with patient. Patient reports vaginal and rectal itching. Has MS, experiences incontinence, wears a pad, changes it often. Hs been using A&D ointment and an old RX of triamcinolone cream, provides some relief, does not resolve symptoms. Keep area clean and dry.  Denies any vaginal d/c or odor. Recommended OV for further evaluation. Last AEX 05/13/17 with PG. Next AEX 6/15/20with SM.  OV scheduled for 12/5 at 10am with Melvia Heaps, CNM.  Routing to provider for final review. Patient is agreeable to disposition. Will close encounter.  Cc: Dr. Sabra Heck

## 2018-11-12 ENCOUNTER — Encounter: Payer: Self-pay | Admitting: Certified Nurse Midwife

## 2018-11-12 ENCOUNTER — Encounter (INDEPENDENT_AMBULATORY_CARE_PROVIDER_SITE_OTHER): Payer: Self-pay | Admitting: Physician Assistant

## 2018-11-12 ENCOUNTER — Ambulatory Visit (INDEPENDENT_AMBULATORY_CARE_PROVIDER_SITE_OTHER): Payer: Medicare Other | Admitting: Certified Nurse Midwife

## 2018-11-12 ENCOUNTER — Ambulatory Visit (INDEPENDENT_AMBULATORY_CARE_PROVIDER_SITE_OTHER): Payer: Medicare Other | Admitting: Orthopedic Surgery

## 2018-11-12 ENCOUNTER — Other Ambulatory Visit: Payer: Self-pay

## 2018-11-12 ENCOUNTER — Ambulatory Visit (INDEPENDENT_AMBULATORY_CARE_PROVIDER_SITE_OTHER): Payer: Medicare Other

## 2018-11-12 VITALS — Ht 65.0 in | Wt 136.0 lb

## 2018-11-12 VITALS — BP 120/70 | HR 68 | Resp 16 | Wt 136.0 lb

## 2018-11-12 DIAGNOSIS — M25551 Pain in right hip: Secondary | ICD-10-CM | POA: Diagnosis not present

## 2018-11-12 DIAGNOSIS — I87323 Chronic venous hypertension (idiopathic) with inflammation of bilateral lower extremity: Secondary | ICD-10-CM

## 2018-11-12 DIAGNOSIS — L290 Pruritus ani: Secondary | ICD-10-CM

## 2018-11-12 DIAGNOSIS — N898 Other specified noninflammatory disorders of vagina: Secondary | ICD-10-CM | POA: Diagnosis not present

## 2018-11-12 DIAGNOSIS — M25561 Pain in right knee: Secondary | ICD-10-CM

## 2018-11-12 DIAGNOSIS — B372 Candidiasis of skin and nail: Secondary | ICD-10-CM | POA: Diagnosis not present

## 2018-11-12 MED ORDER — NYSTATIN 100000 UNIT/GM EX OINT: TOPICAL_OINTMENT | CUTANEOUS | 1 refills | Status: DC

## 2018-11-12 NOTE — Progress Notes (Signed)
84 y.o. Widowed Caucasian female G2P2 here with complaint of rectal itching from incontinence due to MS symptoms.Denies constipation, has soft stool and leakage periodic. She is not having any vaginal issues with yeast, but has in the past. Patient here alone and drives, but has had falls in the past. Walks with her cane. Has friend support through church. Denies any vaginal bleeding. Eats large amount of fiber to help routine stool pattern and has helped at times. Wears pads due to urinary incontinence and feels this may contribute to rectal issues. No other issues today.  Onset of symptoms 4-5 days ago. Denies new personal products or vaginal dryness.    Review of Systems  Constitutional: Negative.   HENT: Negative.   Eyes: Negative.   Respiratory: Negative.   Cardiovascular: Negative.   Gastrointestinal: Negative.   Genitourinary: Negative.   Musculoskeletal: Negative.   Skin:       Rectal itching  Neurological: Negative.   Endo/Heme/Allergies: Negative.   Psychiatric/Behavioral: Negative.     O:Healthy female WDWN Affect: normal, orientation x 3  Physical Exam  Constitutional: She is oriented to person, place, and time. She appears well-developed and well-nourished.  Abdominal: Soft. She exhibits no distension. There is no tenderness.  Genitourinary: Vagina normal. Rectal exam shows tenderness. Rectal exam shows no external hemorrhoid, no internal hemorrhoid and anal tone normal.    Pelvic exam was performed with patient in the knee-chest position. There is no rash, tenderness or lesion on the right labia. There is no rash, tenderness or lesion on the left labia. Right adnexum displays no mass, no tenderness and no fullness. Left adnexum displays no mass, no tenderness and no fullness. No tenderness in the vagina.  Genitourinary Comments: Scant discharge present Affirm taken Labia increase pink noted, ? Due to pad use Cervix and uterus surgically absent  Musculoskeletal:  Walks  with cane  Lymphadenopathy: No inguinal adenopathy noted on the right or left side.       Right: No inguinal adenopathy present.       Left: No inguinal adenopathy present.  Neurological: She is alert and oriented to person, place, and time.  Skin: Skin is warm and dry.  Psychiatric: She has a normal mood and affect. Her behavior is normal. Judgment and thought content normal.   Wet prep: Koh + yeast Saline + yeast  A:Normal pelvic exam History of MS with urinary and stool incontinence Yeast dermatitis of rectum area R/O vaginitis   P:Discussed findings of normal pelvic exam and yeast finding in rectal area and etiology.  Discussed cleaning area with soap and water and stop baby wipes. Continue to work on good bowel habits. Decrease roughage to decrease liquid stools. Rx: Nystatin ointment see order with instructions Lab: Affirm will treat if indicated. Start on oral probiotic to help normalize stools and gut flora.  Encouraged to ask for help when she needs it with MS changes.  Rv in 9 days or prn

## 2018-11-13 ENCOUNTER — Encounter (INDEPENDENT_AMBULATORY_CARE_PROVIDER_SITE_OTHER): Payer: Self-pay | Admitting: Orthopedic Surgery

## 2018-11-13 NOTE — Progress Notes (Signed)
Office Visit Note   Patient: [name]           Date of Birth: [birth year]           MRN: [id number] Visit Date: 11/12/2018              Requested by: [name], MD [address] [place], [place] [address] PCP: [name], MD  Chief Complaint  Patient presents with  . Right Hip - Pain  . Left Hip - Pain      HPI: Patient is an 84 year old woman who is seen for evaluation of right hip and right knee pain.  Patient states she fell yesterday morning hurting her right hip and right knee.  Patient states she has pain with sitting and standing and also complains of increased swelling in both legs.  Assessment & Plan: Visit Diagnoses:  1. Acute pain of right knee   2. Pain in right hip   3. Idiopathic chronic venous hypertension of both lower extremities with inflammation     Plan: Recommended knee-high compression stockings size large for both lower extremities continue current care for her hip and knee pain.  Follow-Up Instructions: Return if symptoms worsen or fail to improve.   Ortho Exam  Patient is alert, oriented, no adenopathy, well-dressed, normal affect, normal respiratory effort. Examination patient has no ecchymosis or bruising around the right knee.  She has massive venous stasis swelling in both lower extremities with pitting edema up to the tibial tubercle.  There is pain with range of motion of the hip and knee.  Her radiographs were reviewed which shows no evidence of a hip fracture no evidence of a pelvic fracture she does have an old rami fracture on the left and she does have arthritis of her hips.  Radiographs of the right knee shows no fracture.  Imaging: No results found. No images are attached to the encounter.  Labs: Lab Results  Component Value Date   ESRSEDRATE 108 (H) 04/03/2010     Lab Results  Component Value Date   ALBUMIN 3.9 12/26/2016   ALBUMIN 3.8 03/12/2009    Body mass index is 22.63  kg/m.  Orders:  Orders Placed This Encounter  Procedures  . XR Knee 1-2 Views Right   No orders of the defined types were placed in this encounter.    Procedures: No procedures performed  Clinical Data: No additional findings.  ROS:  All other systems negative, except as noted in the HPI. Review of Systems  Objective: Vital Signs: Ht 5\' 5"  (1.651 m)   Wt 136 lb (61.7 kg)   LMP 09/08/1970 (Approximate)   BMI 22.63 kg/m   Specialty Comments:  No specialty comments available.  PMFS History: Patient Active Problem List   Diagnosis Date Noted  . Urinary dysfunction 12/22/2017  . Multiple sclerosis, primary progressive ([place]) 06/19/2017  . Left foot drop 03/17/2017  . Primary chronic progressive multiple sclerosis ([place]) 03/17/2017  . Myelopathy ([place]) 03/17/2017   Past Medical History:  Diagnosis Date  . Incontinent of urine   . Multiple sclerosis ([place]) 2015  . Osteoporosis    osteopenia  . Post-menopausal     Family History  Problem Relation Age of Onset  . Osteoarthritis Father   . Stroke Father   . Heart failure Mother   . Hypertension Mother   . Heart disease Mother   . Multiple sclerosis Cousin   . Multiple sclerosis Cousin   . Multiple sclerosis Cousin  Past Surgical History:  Procedure Laterality Date  . ABDOMINAL HYSTERECTOMY  1971   TVH  . breast ductectomy Right 05/1995  . COLONOSCOPY  10/2011   Social History   Occupational History  . Not on file  Tobacco Use  . Smoking status: Never Smoker  . Smokeless tobacco: Never Used  Substance and Sexual Activity  . Alcohol use: No  . Drug use: No  . Sexual activity: Not Currently    Partners: Male    Birth control/protection: Post-menopausal, Surgical    Comment: hysterectomy

## 2018-11-14 LAB — VAGINITIS/VAGINOSIS, DNA PROBE
Candida Species: NEGATIVE
GARDNERELLA VAGINALIS: NEGATIVE
TRICHOMONAS VAG: NEGATIVE

## 2018-11-20 ENCOUNTER — Encounter: Payer: Self-pay | Admitting: Certified Nurse Midwife

## 2018-11-20 ENCOUNTER — Other Ambulatory Visit: Payer: Self-pay

## 2018-11-20 ENCOUNTER — Ambulatory Visit (INDEPENDENT_AMBULATORY_CARE_PROVIDER_SITE_OTHER): Payer: Medicare Other | Admitting: Certified Nurse Midwife

## 2018-11-20 VITALS — BP 122/82 | HR 70 | Resp 16 | Wt 137.0 lb

## 2018-11-20 DIAGNOSIS — L258 Unspecified contact dermatitis due to other agents: Secondary | ICD-10-CM

## 2018-11-20 DIAGNOSIS — H903 Sensorineural hearing loss, bilateral: Secondary | ICD-10-CM | POA: Diagnosis not present

## 2018-11-20 DIAGNOSIS — R159 Full incontinence of feces: Secondary | ICD-10-CM | POA: Diagnosis not present

## 2018-11-20 DIAGNOSIS — B372 Candidiasis of skin and nail: Secondary | ICD-10-CM

## 2018-11-20 NOTE — Progress Notes (Signed)
84 y.o. Widowed Caucasian female G2P2 here for follow up of rectal yeast dermatitis treated with Nystatin ointment, Balmex for skin protection and Oral probiotic to help with GI changes on 11/12/18. Patient using medication as directed and has no more pain or itching in rectal area. Has started on oral probiotic and feels her bowel movements are better. Patient has stool incontinence and bladder incontinence with her MS. Here to recheck area of irritation.  Review of Systems  Constitutional: Negative.        Nothing new  HENT: Negative.   Eyes: Negative.   Respiratory: Negative.   Cardiovascular: Negative.   Gastrointestinal: Negative for abdominal pain, blood in stool, constipation and diarrhea.       Less stool irritation  Genitourinary: Negative for dysuria, frequency and urgency.       No change  Musculoskeletal: Negative.        MS symptoms with legs  Skin: Negative.   Neurological: Negative.   Psychiatric/Behavioral: Negative.     O:Healthy female WDWN Affect: normal, orientation x 3  Physical Exam Constitutional:      Appearance: Normal appearance.  Cardiovascular:     Rate and Rhythm: Normal rate.  Pulmonary:     Effort: Pulmonary effort is normal.  Genitourinary:    General: Normal vulva.     Labia:        Right: No rash, tenderness or lesion.        Left: No rash, tenderness or lesion.        Comments: No pelvic exam needed Lymphadenopathy:     Lower Body: No right inguinal adenopathy. No left inguinal adenopathy.  Skin:    General: Skin is warm and dry.  Neurological:     Mental Status: She is alert and oriented to person, place, and time.     Gait: Gait normal.     Comments: Due to MS changes  Psychiatric:        Mood and Affect: Mood normal.        Behavior: Behavior normal.        Thought Content: Thought content normal.        Judgment: Judgment normal.    A. History of yeast dermatitis around anal area, resolving with Nystatin use and Balmex for  protection of skin Healthy skin noted MS history with stool and urinary incontinence  P: Discussed finding with patient and continue to use Nystatin as needed for itching when occurs and Balmex for protection. Questions addressed.  Rv prn

## 2018-12-01 DIAGNOSIS — H353132 Nonexudative age-related macular degeneration, bilateral, intermediate dry stage: Secondary | ICD-10-CM | POA: Diagnosis not present

## 2018-12-08 ENCOUNTER — Other Ambulatory Visit: Payer: Self-pay | Admitting: Physician Assistant

## 2018-12-08 ENCOUNTER — Ambulatory Visit
Admission: RE | Admit: 2018-12-08 | Discharge: 2018-12-08 | Disposition: A | Discharge status: Home / Self Care | Payer: Medicare Other | Source: Ambulatory Visit | Attending: Physician Assistant | Admitting: Physician Assistant

## 2018-12-08 DIAGNOSIS — M7989 Other specified soft tissue disorders: Secondary | ICD-10-CM | POA: Diagnosis not present

## 2018-12-08 DIAGNOSIS — R609 Edema, unspecified: Secondary | ICD-10-CM

## 2018-12-08 DIAGNOSIS — I517 Cardiomegaly: Secondary | ICD-10-CM | POA: Diagnosis not present

## 2018-12-08 DIAGNOSIS — R600 Localized edema: Secondary | ICD-10-CM | POA: Diagnosis not present

## 2018-12-10 ENCOUNTER — Other Ambulatory Visit: Payer: Self-pay | Admitting: Physician Assistant

## 2018-12-10 DIAGNOSIS — M1990 Unspecified osteoarthritis, unspecified site: Secondary | ICD-10-CM | POA: Diagnosis not present

## 2018-12-10 DIAGNOSIS — R609 Edema, unspecified: Secondary | ICD-10-CM

## 2018-12-14 ENCOUNTER — Encounter (INDEPENDENT_AMBULATORY_CARE_PROVIDER_SITE_OTHER): Payer: Self-pay | Admitting: Orthopedic Surgery

## 2018-12-14 ENCOUNTER — Ambulatory Visit (INDEPENDENT_AMBULATORY_CARE_PROVIDER_SITE_OTHER): Payer: Medicare Other | Admitting: Physician Assistant

## 2018-12-14 VITALS — Ht 65.0 in | Wt 137.0 lb

## 2018-12-14 DIAGNOSIS — G35 Multiple sclerosis: Secondary | ICD-10-CM | POA: Diagnosis not present

## 2018-12-14 DIAGNOSIS — I87323 Chronic venous hypertension (idiopathic) with inflammation of bilateral lower extremity: Secondary | ICD-10-CM

## 2018-12-15 ENCOUNTER — Encounter (INDEPENDENT_AMBULATORY_CARE_PROVIDER_SITE_OTHER): Payer: Self-pay | Admitting: Physician Assistant

## 2018-12-15 NOTE — Progress Notes (Signed)
Office Visit Note   Patient: [name]           Date of Birth: 29-Dec-1931           MRN: [id number] Visit Date: 12/14/2018              Requested by: [name], MD [address] [place], [place] [address] PCP: [name], MD  Chief Complaint  Patient presents with  . Right Leg - Follow-up, Edema  . Left Leg - Follow-up, Edema      HPI: The patient is an 85 year old woman who is seen with Dr. Sharol Given for follow-up of bilateral lower extremity chronic edema.  She reports that over the past week her bilateral lower extremity edema has worsened dramatically.  She was previously given a prescription for a large Vive compression socks but her edema has worsened to the point where she can no longer get into the large size.  She is also noted that her weight is up due to the edema.  She is also noted edema of her face and bilateral hands and wrist.  She reports stiffness in her joints at the wrist hands ankles and knees and some pain in all of these areas.  She denies any shortness of breath.  She has seen her primary care physician and has been referred to cardiology for an echocardiogram to evaluate her cardiac status and rule out heart failure as part of the ongoing edema issue.  She reports no other new medications.  Assessment & Plan: Visit Diagnoses:  1. Idiopathic chronic venous hypertension of both lower extremities with inflammation   2. MS (multiple sclerosis) ([place])     Plan: Recommend she proceed with cardiology evaluation with echocardiogram to evaluate cardiac status and bilateral lower extremity edema further.  We did give her a new prescription for extra-large compression stockings and to fill this will be of benefit to her to go ahead and get regardless of the outcome of her cardiac work-up.  Dr. Sharol Given does not feel like this is consistent with a rheumatologic issue as it is more a problem volume status and not consistent with a arthropathy.  She  will follow-up here on an as-needed basis.  Follow-Up Instructions: Return if symptoms worsen or fail to improve.   Ortho Exam  Patient is alert, oriented, no adenopathy, well-dressed, normal affect, normal respiratory effort. Patient has mild edema of the wrist and hands bilaterally with some very mild erythema.  She has some mild tenderness with range of motion at the wrist.  Bilateral lower extremity edema is marked with the left calf at 44 cm and the right calf at 41 cm today.  She has severe pitting.  She has no skin breakdown.  She has palpable pedal pulses.  There are no signs of cellulitis or infection.  Imaging: No results found. No images are attached to the encounter.  Labs: Lab Results  Component Value Date   ESRSEDRATE 108 (H) 04/03/2010     Lab Results  Component Value Date   ALBUMIN 3.9 12/26/2016   ALBUMIN 3.8 03/12/2009    Body mass index is 22.8 kg/m.  Orders:  No orders of the defined types were placed in this encounter.  No orders of the defined types were placed in this encounter.    Procedures: No procedures performed  Clinical Data: No additional findings.  ROS:  All other systems negative, except as noted in the HPI. Review of Systems  Objective: Vital  Signs: Ht 5\' 5"  (1.651 m)   Wt 137 lb (62.1 kg)   LMP 09/08/1970 (Approximate)   BMI 22.80 kg/m   Specialty Comments:  No specialty comments available.  PMFS History: Patient Active Problem List   Diagnosis Date Noted  . Urinary dysfunction 12/22/2017  . Multiple sclerosis, primary progressive ([place]) 06/19/2017  . Left foot drop 03/17/2017  . Primary chronic progressive multiple sclerosis ([place]) 03/17/2017  . Myelopathy ([place]) 03/17/2017   Past Medical History:  Diagnosis Date  . Incontinent of urine   . Multiple sclerosis ([place]) 2015  . Osteoporosis    osteopenia  . Post-menopausal     Family History  Problem Relation Age of Onset  . Osteoarthritis Father   . Stroke Father    . Heart failure Mother   . Hypertension Mother   . Heart disease Mother   . Multiple sclerosis Cousin   . Multiple sclerosis Cousin   . Multiple sclerosis Cousin     Past Surgical History:  Procedure Laterality Date  . ABDOMINAL HYSTERECTOMY  1971   TVH  . breast ductectomy Right 05/1995  . COLONOSCOPY  10/2011   Social History   Occupational History  . Not on file  Tobacco Use  . Smoking status: Never Smoker  . Smokeless tobacco: Never Used  Substance and Sexual Activity  . Alcohol use: No  . Drug use: No  . Sexual activity: Not Currently    Partners: Male    Birth control/protection: Post-menopausal, Surgical    Comment: hysterectomy

## 2018-12-16 ENCOUNTER — Ambulatory Visit (HOSPITAL_COMMUNITY): Payer: Medicare Other | Attending: Cardiology

## 2018-12-16 ENCOUNTER — Other Ambulatory Visit: Payer: Self-pay

## 2018-12-16 DIAGNOSIS — R609 Edema, unspecified: Secondary | ICD-10-CM | POA: Diagnosis not present

## 2018-12-16 DIAGNOSIS — I083 Combined rheumatic disorders of mitral, aortic and tricuspid valves: Secondary | ICD-10-CM | POA: Diagnosis not present

## 2018-12-18 ENCOUNTER — Telehealth (INDEPENDENT_AMBULATORY_CARE_PROVIDER_SITE_OTHER): Payer: Self-pay | Admitting: Orthopedic Surgery

## 2018-12-18 NOTE — Telephone Encounter (Signed)
Patient called advised she went to the medical supply place and was told there is no way she can wear the compression sock because she is to swollen. The number to contact patient is [phone]

## 2018-12-21 ENCOUNTER — Telehealth (INDEPENDENT_AMBULATORY_CARE_PROVIDER_SITE_OTHER): Payer: Self-pay

## 2018-12-21 NOTE — Telephone Encounter (Signed)
I called patient to advise her to come in for a follow-up but she informed me that she will be making appt with Rheumatologist or Endocrinologist before making an appt with Dr Sharol Given. This was advised by her PCP. I told her to give [place] a call when she is ready to come in. Patient understood.

## 2018-12-23 ENCOUNTER — Encounter: Payer: Self-pay | Admitting: Neurology

## 2018-12-23 ENCOUNTER — Ambulatory Visit (INDEPENDENT_AMBULATORY_CARE_PROVIDER_SITE_OTHER): Payer: Medicare Other | Admitting: Neurology

## 2018-12-23 VITALS — BP 151/70 | HR 64 | Wt 140.5 lb

## 2018-12-23 DIAGNOSIS — I5189 Other ill-defined heart diseases: Secondary | ICD-10-CM

## 2018-12-23 DIAGNOSIS — G35 Multiple sclerosis: Secondary | ICD-10-CM

## 2018-12-23 DIAGNOSIS — R39198 Other difficulties with micturition: Secondary | ICD-10-CM | POA: Diagnosis not present

## 2018-12-23 DIAGNOSIS — I519 Heart disease, unspecified: Secondary | ICD-10-CM | POA: Diagnosis not present

## 2018-12-23 DIAGNOSIS — R600 Localized edema: Secondary | ICD-10-CM | POA: Diagnosis not present

## 2018-12-23 NOTE — Progress Notes (Signed)
[facility]  PATIENT: [name] DOB: 1932-03-29  REFERRING DOCTOR OR PCP:  [name] SOURCE:   Patient, notes from Dr. Marisue Humble and Dr. Catalina Gravel, imaging results, MRI images on PACS.  _________________________________   HISTORICAL  CHIEF COMPLAINT:  Chief Complaint  Patient presents with  . Follow-up    RM 12, alone. Last seen 12/22/2017.  She had pitting edema in both hands/legs and feet.  Gyneologist recommended she consume 2 cups yogurt per day. She wants to discuss to make sure this is ok. She read article that stated MS pt should not eat dairy/wheat products.   . Primary progressive MS    Not on DMT  . Gait Problem    Ambulates with cane. [place] twice in December 2019.   . Leg Swelling    Started lasix 20mg  daily yesterday for hand/leg swelling. Has not taken today.    HISTORY OF PRESENT ILLNESS:  [name] is an 85 year old woman who probably has primary progressive multiple sclerosis.   Update 12/23/2018: She feels she has been stable over the last year.    She likely has Primary Progressive MS but has not wanted to go on Ocrevus  --- which due to age and possibly higher risk is reasonable.     She has had a lot of swelling/edema and has gained 20 pounds of fluid.   She had an Echo showing grade 2 diastolic dysfunction and elevated ventricular end-diastolic filling pressure.  She also has mild to moderate AR and MR.     Lasix has been started and she feels the swelling is mildly better.   Compression stockings were recommended but the pair she got was too small.     Her gait and balance are mildly worse and she needs unilateral support,.   [facility].   She has a Rollator but getting it in / out of the trunk is difficult so she very rarely uses it.    Bladder is doing worse with urgency and some urge incontinence  Update 12/22/2017:    She notes notes her walking is doing worse.   She uses a cane.  She has  a walker but does not use it much (only if going to be on her feet a longer time.    She never got the brace as she can't wear closed toe shoes.   She has no recent fall but stumbles a lot.     She feels she is moving more slowly.   She always feels more steady in the house if she touches the walls or furniture (even if not holding on).  Her left leg does worse than her right.    Bladder is doing worse.  She has both frequency and hesitancy.   She will sometimes have incontinence in the bathroom but not outside.     She stopped Myrbetriq due to constipation and elevated BP.   However, she takes it if going to be out of the house a while.    Vision is stable.   She had visual changes from temporal arteritis in 2004 and took prednisone x years.   She notes fatigue and gait is worse when more tired.   She has had mild depression but this is stable.    She is very active with her church and [facility].   She sleeps well many nights but other nights wake sup around 230-300 and stays awake.  From 7/122018:  Gait/strength/sensation: She has a long history of difficulties with her gait. The left side is worse than the right side and her legs are much weaker than the arms (only minimal left weakness).    Gait has been worse with the recent heat.   She feels balance has worsened more this year.    We have tried to get her an AFO but there has been some issues getting in contact with [employer].     Bladder:   She has bladder frequency and hesitancy.  Myrbetriq has helped the frequency but she has some more hesitancy and also notes more constipation.     She has no recent UTI.   She has hesitancy several times at night and has urinary frequency and urgency with incontinence a couple times daily.     Vision:    Her vision was affected by temporal arteritis (2004) and she was on prednisone a year.   She also has macular degeneration.    She has a visual field at home machine and uses it once daily.    Mood:   She has  noted mild depression after the death of her husband.     She is [facility] and [facility] and church activities 3-4 times a week including choir.   She has had some stress with her husband of 66 years dying 7-8 years ago and her second husband dying more recently.  Fatigue:    She notes her fatigue is much worse with heat, physically and cognitively.  Sometimes she needs a nap due to fatigfue and sleepiness.   She sleeps well most nights.      MS History:   She has had difficulty with her gait for many years. Her left leg seemed clumsy at least 10-15 years ago.   Her left toes would drag at times and her balance was poor.    About 2011, she fell at church and broke her pelvis tripping over a gown.   Her gait continues to worsen.   Her right leg does well.   Her left arm is slightly clumsy and slightly weaker compared to her right.     All of her changes have been gradual.     She denies any numbness.   In 2014, she had MRIs showing many T2/FLAIR hyperintense foci in the brain. At her age,  the pattern was felt to be nonspecific. However, MRI of the cervical and thoracic spine showed adjacent to C2 to the right and a plaque adjacent to T6 to the left  I have reviewed the MRIs of her brain and spine in 11/11/2013.   The MRI of the cervical spine shows a focus to the right at C2 and another focus to the left at C6. It also shows multilevel degenerative changes with anterolisthesis of C4 upon C5 but no nerve root compression.  No spinal cord compression.  The MRI of the thoracic spine shows a focus to the left adjacent to T6. It also showed a chronic T9 compression fracture (she was never aware of this).  MRI of the brain shows many T2/FLAIR hyperintense foci.  Foci are non-specific though many of the foci are radially oriented to the ventricles in the periventricular white matter. Some foci in the pons have an appearance more typical for chronic microvascular ischemic change. MRI 2014 showed a chronic  compression fracture at T9 and she can not think of no other episode of significant back pain.  Her one paternal first cousin and 2 maternal cousins have MS.    She does not know whether they had PPMS or RRMS.    REVIEW OF SYSTEMS: Constitutional: No fevers, chills, sweats, or change in appetite.   Some fatigue.   Occ insomnia (sleep maintenance).    Pedal edema.   Eyes: No visual changes, double vision, eye pain Ear, nose and throat: No hearing loss, ear pain, nasal congestion, sore throat Cardiovascular: No chest pain, palpitations.   Echo shows grade 2 diastolic dysfunction.   Respiratory: No shortness of breath at rest or with exertion.   No wheezes.    GastrointestinaI: No nausea, vomiting, diarrhea, abdominal pain, fecal incontinence Genitourinary: see above.  .    Musculoskeletal: No current neck pain, back pain Integumentary: No rash, pruritus, skin lesions Neurological: as above Psychiatric: Mild depression but no anxiety Endocrine: No palpitations, diaphoresis, change in appetite, change in weigh or increased thirst Hematologic/Lymphatic: No anemia, purpura, petechiae. Allergic/Immunologic: No itchy/runny eyes, nasal congestion, recent allergic reactions, rashes  ALLERGIES: Allergies  Allergen Reactions  . Sulfasalazine Anaphylaxis  . Aspirin Nausea Only  . Penicillins Diarrhea  . Sulfa Antibiotics     HOME MEDICATIONS:  Current Outpatient Medications:  .  azelastine (ASTELIN) 137 MCG/SPRAY nasal spray, Place 1 spray into the nose 2 (two) times daily as needed for allergies. Use in each nostril as directed , Disp: , Rfl:  .  Biotin 1 MG CAPS, Take 1 capsule by mouth daily. , Disp: , Rfl:  .  Calcium Carbonate-Vitamin D (CALCIUM + D PO), Take 1 tablet by mouth 2 (two) times daily. , Disp: , Rfl:  .  levothyroxine (SYNTHROID, LEVOTHROID) 25 MCG tablet, Take 25 mcg by mouth daily., Disp: , Rfl:  .  Magnesium 500 MG TABS, Take 0.5 tablets by mouth every evening. , Disp:  , Rfl:  .  Multiple Vitamins-Minerals (ICAPS AREDS FORMULA PO), Take by mouth., Disp: , Rfl:  .  nystatin ointment (MYCOSTATIN), Apply to rectal area thinly twice daily for 7 days (Patient taking differently: Apply 1 application topically as needed. Apply to rectal area thinly twice daily for 7 days), Disp: 30 g, Rfl: 1 .  triamcinolone cream (KENALOG) 0.1 %, Apply topically as needed., Disp: 30 g, Rfl: 4 .  Vitamin D, Ergocalciferol, (DRISDOL) 50000 UNITS CAPS capsule, Take 1 capsule (50,000 Units total) by mouth every 7 (seven) days., Disp: 30 capsule, Rfl: 3 .  vitamin E 100 UNIT capsule, Take 100 Units by mouth daily., Disp: , Rfl:   PAST MEDICAL HISTORY: Past Medical History:  Diagnosis Date  . Incontinent of urine   . Multiple sclerosis ([place]) 2015  . Osteoporosis    osteopenia  . Post-menopausal     PAST SURGICAL HISTORY: Past Surgical History:  Procedure Laterality Date  . ABDOMINAL HYSTERECTOMY  1971   TVH  . breast ductectomy Right 05/1995  . COLONOSCOPY  10/2011    FAMILY HISTORY: Family History  Problem Relation Age of Onset  . Osteoarthritis Father   . Stroke Father   . Heart failure Mother   . Hypertension Mother   . Heart disease Mother   . Multiple sclerosis Cousin   . Multiple sclerosis Cousin   . Multiple sclerosis Cousin     SOCIAL HISTORY:  Social History   Socioeconomic History  . Marital status: Widowed    Spouse name: Not on file  . Number of children: Not on file  . Years of education: Not on file  .  Highest education level: Not on file  Occupational History  . Not on file  Social Needs  . Financial resource strain: Not on file  . Food insecurity:    Worry: Not on file    Inability: Not on file  . Transportation needs:    Medical: Not on file    Non-medical: Not on file  Tobacco Use  . Smoking status: Never Smoker  . Smokeless tobacco: Never Used  Substance and Sexual Activity  . Alcohol use: No  . Drug use: No  . Sexual activity:  Not Currently    Partners: Male    Birth control/protection: Post-menopausal, Surgical    Comment: hysterectomy  Lifestyle  . Physical activity:    Days per week: Not on file    Minutes per session: Not on file  . Stress: Not on file  Relationships  . Social connections:    Talks on phone: Not on file    Gets together: Not on file    Attends religious service: Not on file    Active member of club or organization: Not on file    Attends meetings of clubs or organizations: Not on file    Relationship status: Not on file  . Intimate partner violence:    Fear of current or ex partner: Not on file    Emotionally abused: Not on file    Physically abused: Not on file    Forced sexual activity: Not on file  Other Topics Concern  . Not on file  Social History Narrative  . Not on file     PHYSICAL EXAM  Vitals:   12/23/18 1100  BP: (!) 151/70  Pulse: 64  SpO2: 97%  Weight: 140 lb 8 oz (63.7 kg)    Body mass index is 23.38 kg/m.   General: The patient is well-developed and well-nourished and in no acute distress   Skin: Extremities show severe bilateral pitting pedal edema.   Neurologic Exam  Mental status: The patient is alert and oriented x 3 at the time of the examination. The patient has apparent normal recent and remote memory, with an apparently normal attention span and concentration ability.   Speech is normal.  Cranial nerves: Extraocular movements are full. The facial strength and facial sensation are normal. Trapezius strength is normal.  The tongue is midline, and the patient has symmetric elevation of the soft palate. No obvious hearing deficits are noted.  Motor:  Muscle bulk is normal.   Tone is increased in the legs, left > right.   Strength is 5/5 in the arms (but reduced left RAM) and 5/5 in right leg and proximal left leg and 4/5 distal left leg   Sensory: Sensory testing shows reduced touch and vibration on her right leg relative to the left but reduced  right hand vibration and temperature sensations on the left leg worse than left arm.  Reduced sensation to vibration at the toes.  Coordination: Cerebellar testing shows good finger-nose-finger but reduced left heel-to-shin.  Gait and station: Station is normal.   Gait is wide and she has a left foot drop.  She is midly ataxic.  She cannot tandem walk.  Romberg is negative.  Reflexes: Deep tendon reflexes are symmetric and normal bilaterally.   Plantar responses are extensor on her left.    DIAGNOSTIC DATA (LABS, IMAGING, TESTING) - I reviewed patient records, labs, notes, testing and imaging myself where available.  Lab Results  Component Value Date   WBC 5.7 12/26/2016  HGB 13.2 12/26/2016   HCT 37.5 12/26/2016   MCV 85.4 12/26/2016   PLT 162 12/26/2016      Component Value Date/Time   NA 128 (L) 12/26/2016 1501   K 4.1 12/26/2016 1501   CL 95 (L) 12/26/2016 1501   CO2 26 12/26/2016 1501   GLUCOSE 114 (H) 12/26/2016 1501   BUN 16 12/26/2016 1501   CREATININE 0.60 12/26/2016 1501   CALCIUM 8.9 12/26/2016 1501   PROT 6.6 12/26/2016 1501   ALBUMIN 3.9 12/26/2016 1501   AST 41 12/26/2016 1501   ALT 24 12/26/2016 1501   ALKPHOS 74 12/26/2016 1501   BILITOT 0.4 12/26/2016 1501   GFRNONAA >60 12/26/2016 1501   GFRAA >60 12/26/2016 1501       ASSESSMENT AND PLAN  Multiple sclerosis, primary progressive (HCC)  Urinary dysfunction  Pedal edema  Grade II diastolic dysfunction   1.   Will continue off of any DMT.   Progression has been very slow and risks of Ocrevus might outweigh the benefits.  2.   Stay active as possible and [facility].  Use cane or walker, especially out of house.    3.   rtc 12 months or sooner if new or worsening neurologic issues.   Can alternate visits with NP.      A. Felecia Shelling, MD, PhD, [facility] Grimes   12/23/2018, 11:39 AM Director of the [place] [facility], [facility] Certified in Neurology,  [facility], Sleep Medicine, Pain Medicine and [facility] [address] [place], [place] [address] [phone]

## 2018-12-31 DIAGNOSIS — H353132 Nonexudative age-related macular degeneration, bilateral, intermediate dry stage: Secondary | ICD-10-CM | POA: Diagnosis not present

## 2019-01-01 DIAGNOSIS — M2550 Pain in unspecified joint: Secondary | ICD-10-CM | POA: Diagnosis not present

## 2019-01-05 DIAGNOSIS — H43813 Vitreous degeneration, bilateral: Secondary | ICD-10-CM | POA: Diagnosis not present

## 2019-01-05 DIAGNOSIS — H3540 Unspecified peripheral retinal degeneration: Secondary | ICD-10-CM | POA: Diagnosis not present

## 2019-01-05 DIAGNOSIS — H35371 Puckering of macula, right eye: Secondary | ICD-10-CM | POA: Diagnosis not present

## 2019-01-05 DIAGNOSIS — H353132 Nonexudative age-related macular degeneration, bilateral, intermediate dry stage: Secondary | ICD-10-CM | POA: Diagnosis not present

## 2019-01-08 DIAGNOSIS — R700 Elevated erythrocyte sedimentation rate: Secondary | ICD-10-CM | POA: Diagnosis not present

## 2019-01-08 DIAGNOSIS — M79641 Pain in right hand: Secondary | ICD-10-CM | POA: Diagnosis not present

## 2019-01-08 DIAGNOSIS — M79643 Pain in unspecified hand: Secondary | ICD-10-CM | POA: Diagnosis not present

## 2019-01-08 DIAGNOSIS — M79642 Pain in left hand: Secondary | ICD-10-CM | POA: Diagnosis not present

## 2019-01-08 DIAGNOSIS — M064 Inflammatory polyarthropathy: Secondary | ICD-10-CM | POA: Diagnosis not present

## 2019-01-08 DIAGNOSIS — M25561 Pain in right knee: Secondary | ICD-10-CM | POA: Diagnosis not present

## 2019-01-08 DIAGNOSIS — N390 Urinary tract infection, site not specified: Secondary | ICD-10-CM | POA: Diagnosis not present

## 2019-01-08 DIAGNOSIS — G35 Multiple sclerosis: Secondary | ICD-10-CM | POA: Diagnosis not present

## 2019-01-08 DIAGNOSIS — M7989 Other specified soft tissue disorders: Secondary | ICD-10-CM | POA: Diagnosis not present

## 2019-01-08 DIAGNOSIS — M25562 Pain in left knee: Secondary | ICD-10-CM | POA: Diagnosis not present

## 2019-01-08 DIAGNOSIS — M19071 Primary osteoarthritis, right ankle and foot: Secondary | ICD-10-CM | POA: Diagnosis not present

## 2019-01-08 DIAGNOSIS — M19072 Primary osteoarthritis, left ankle and foot: Secondary | ICD-10-CM | POA: Diagnosis not present

## 2019-01-08 DIAGNOSIS — M130 Polyarthritis, unspecified: Secondary | ICD-10-CM | POA: Diagnosis not present

## 2019-01-08 DIAGNOSIS — I509 Heart failure, unspecified: Secondary | ICD-10-CM | POA: Diagnosis not present

## 2019-01-08 DIAGNOSIS — Z79899 Other long term (current) drug therapy: Secondary | ICD-10-CM | POA: Diagnosis not present

## 2019-01-08 DIAGNOSIS — M79671 Pain in right foot: Secondary | ICD-10-CM | POA: Diagnosis not present

## 2019-01-08 DIAGNOSIS — M19042 Primary osteoarthritis, left hand: Secondary | ICD-10-CM | POA: Diagnosis not present

## 2019-01-08 DIAGNOSIS — M19041 Primary osteoarthritis, right hand: Secondary | ICD-10-CM | POA: Diagnosis not present

## 2019-01-13 ENCOUNTER — Ambulatory Visit (INDEPENDENT_AMBULATORY_CARE_PROVIDER_SITE_OTHER): Payer: Medicare Other | Admitting: Podiatry

## 2019-01-13 DIAGNOSIS — G35 Multiple sclerosis: Secondary | ICD-10-CM | POA: Diagnosis not present

## 2019-01-13 DIAGNOSIS — M79674 Pain in right toe(s): Secondary | ICD-10-CM | POA: Diagnosis not present

## 2019-01-13 DIAGNOSIS — B351 Tinea unguium: Secondary | ICD-10-CM | POA: Diagnosis not present

## 2019-01-13 DIAGNOSIS — M79675 Pain in left toe(s): Secondary | ICD-10-CM | POA: Diagnosis not present

## 2019-01-13 DIAGNOSIS — L84 Corns and callosities: Secondary | ICD-10-CM | POA: Diagnosis not present

## 2019-01-13 NOTE — Patient Instructions (Signed)

## 2019-01-18 ENCOUNTER — Encounter: Payer: Self-pay | Admitting: Podiatry

## 2019-01-18 NOTE — Progress Notes (Signed)
Subjective: Memory Argue presents today with painful, thick toenails 1-5 b/l that she cannot cut and which interfere with daily activities.  Pain is aggravated when wearing enclosed shoe gear.  Ms. Messenger also has corns b/l feet which are painful due to gripping of toes when ambulating.   [name], MD is her PCP.   Current Outpatient Medications:  .  azelastine (ASTELIN) 137 MCG/SPRAY nasal spray, Place 1 spray into the nose 2 (two) times daily as needed for allergies. Use in each nostril as directed , Disp: , Rfl:  .  Biotin 1 MG CAPS, Take 1 capsule by mouth daily. , Disp: , Rfl:  .  Calcium Carbonate-Vitamin D (CALCIUM + D PO), Take 1 tablet by mouth 2 (two) times daily. , Disp: , Rfl:  .  levothyroxine (SYNTHROID, LEVOTHROID) 25 MCG tablet, Take 25 mcg by mouth daily., Disp: , Rfl:  .  Magnesium 500 MG TABS, Take 0.5 tablets by mouth every evening. , Disp: , Rfl:  .  Multiple Vitamins-Minerals (ICAPS AREDS FORMULA PO), Take by mouth., Disp: , Rfl:  .  nystatin ointment (MYCOSTATIN), Apply to rectal area thinly twice daily for 7 days (Patient taking differently: Apply 1 application topically as needed. Apply to rectal area thinly twice daily for 7 days), Disp: 30 g, Rfl: 1 .  triamcinolone cream (KENALOG) 0.1 %, Apply topically as needed., Disp: 30 g, Rfl: 4 .  Vitamin D, Ergocalciferol, (DRISDOL) 50000 UNITS CAPS capsule, Take 1 capsule (50,000 Units total) by mouth every 7 (seven) days., Disp: 30 capsule, Rfl: 3 .  vitamin E 100 UNIT capsule, Take 100 Units by mouth daily., Disp: , Rfl:   Allergies  Allergen Reactions  . Sulfasalazine Anaphylaxis  . Aspirin Nausea Only  . Penicillins Diarrhea  . Sulfa Antibiotics     Objective:  Vascular Examination: Capillary refill time immediate x 10 digits  Dorsalis pedis and Posterior tibial pulses palpable b/l  Digital hair present x 10 digits  Skin temperature gradient WNL b/l  Dermatological Examination: Skin with  normal turgor, texture and tone b/l  Toenails 1-5 b/l discolored, thick, dystrophic with subungual debris and pain with palpation to nailbeds due to thickness of nails.  Hyperkeratotic lesions distal tips 2nd, 3rd digits b/l and submet head 5 b/l.  No erythema, no edema, no drainage.  Musculoskeletal: Muscle strength 5/5 to all LE muscle groups  No gross bony deformities b/l.  No pain, crepitus or joint limitation noted with ROM.   Neurological: Sensation intact with 10 gram monofilament. Vibratory sensation intact.   Assessment: Painful onychomycosis toenails 1-5 b/l  Corns distal tips 2nd, 3rd digits b/l Calluses submet head 5 b/l Multple Sclerosis  Plan: 1. Toenails 1-5 b/l were debrided in length and girth without iatrogenic bleeding. 2. Corns and calluses were pared without incident. 3. Patient to continue soft, supportive shoe gear 4. Patient to report any pedal injuries to medical professional immediately. 5. Follow up 9 weeks. 6. Patient/POA to call should there be a concern in the interim.

## 2019-01-21 DIAGNOSIS — M79643 Pain in unspecified hand: Secondary | ICD-10-CM | POA: Diagnosis not present

## 2019-01-21 DIAGNOSIS — M7989 Other specified soft tissue disorders: Secondary | ICD-10-CM | POA: Diagnosis not present

## 2019-01-21 DIAGNOSIS — Z79899 Other long term (current) drug therapy: Secondary | ICD-10-CM | POA: Diagnosis not present

## 2019-01-21 DIAGNOSIS — I517 Cardiomegaly: Secondary | ICD-10-CM | POA: Diagnosis not present

## 2019-01-21 DIAGNOSIS — R899 Unspecified abnormal finding in specimens from other organs, systems and tissues: Secondary | ICD-10-CM | POA: Diagnosis not present

## 2019-01-21 DIAGNOSIS — I509 Heart failure, unspecified: Secondary | ICD-10-CM | POA: Diagnosis not present

## 2019-01-21 DIAGNOSIS — M130 Polyarthritis, unspecified: Secondary | ICD-10-CM | POA: Diagnosis not present

## 2019-01-21 DIAGNOSIS — M0579 Rheumatoid arthritis with rheumatoid factor of multiple sites without organ or systems involvement: Secondary | ICD-10-CM | POA: Diagnosis not present

## 2019-01-21 DIAGNOSIS — M064 Inflammatory polyarthropathy: Secondary | ICD-10-CM | POA: Diagnosis not present

## 2019-01-21 DIAGNOSIS — G35 Multiple sclerosis: Secondary | ICD-10-CM | POA: Diagnosis not present

## 2019-01-21 DIAGNOSIS — R809 Proteinuria, unspecified: Secondary | ICD-10-CM | POA: Diagnosis not present

## 2019-01-21 DIAGNOSIS — R700 Elevated erythrocyte sedimentation rate: Secondary | ICD-10-CM | POA: Diagnosis not present

## 2019-01-25 ENCOUNTER — Ambulatory Visit (INDEPENDENT_AMBULATORY_CARE_PROVIDER_SITE_OTHER): Payer: Medicare Other | Admitting: Cardiology

## 2019-01-25 ENCOUNTER — Encounter: Payer: Self-pay | Admitting: Cardiology

## 2019-01-25 VITALS — BP 150/70 | HR 65 | Ht 65.0 in | Wt 137.0 lb

## 2019-01-25 DIAGNOSIS — R600 Localized edema: Secondary | ICD-10-CM | POA: Diagnosis not present

## 2019-01-25 DIAGNOSIS — I872 Venous insufficiency (chronic) (peripheral): Secondary | ICD-10-CM

## 2019-01-25 DIAGNOSIS — I5189 Other ill-defined heart diseases: Secondary | ICD-10-CM

## 2019-01-25 DIAGNOSIS — I519 Heart disease, unspecified: Secondary | ICD-10-CM

## 2019-01-25 HISTORY — DX: Venous insufficiency (chronic) (peripheral): I87.2

## 2019-01-25 NOTE — Progress Notes (Signed)
PCP: [name], MD  Clinic Note: Chief Complaint  Patient presents with  . New Patient (Initial Visit)    I had an echocardiogram and they said I had some CHF  . Edema    Chronic    HPI: Catlyn Shipton is a 85 y.o. female who is being seen today for the evaluation of ABNORMAL ECHOCARDIOGRAM, "Elizabeth "at the request of [name], MD.  Memory Argue was last seen on December 10, 2018 by Dr. Marisue Humble with continued complaints of swollen joints edema arthritis etc.  She also likely had venous stasis with longstanding lower extremity edema.  An echocardiogram was ordered which revealed grade 2 diastolic dysfunction.  As a result of this finding, she was referred for cardiology consultation and to discuss the results.  Recent Hospitalizations: None --Has seen rheumatology for possible multiple sclerosis, rheumatoid arthritis etc.  Studies Personally Reviewed - (if available, images/films reviewed: From Epic Chart or Care Everywhere)  2D Echo Jan 2020: EF 60-65%. Gr 2 DD. Mild-Mod AI & MR. Mod Biatrial enlargement. PAP mildly elevated.   Gr 2 DD is not overtly abnormal for age & HTN.   Interval History: Any presents here with chronic tense lower extremity edema.  There are some areas where there seems to be some oozing.  She has not been out of wear support stockings because of this recently.  The swelling has gotten worse over the last month or so, but she is also having swelling in her hands and knees as well as other joints. She also notes that over the last 10 days or so, she has diuresed about 12 pounds and feels much better.  Despite that she still has significant edema.  She says actually a lot of her swelling is gone down since she started taking prednisone.  I think most that swelling was in her hands and other joints.  When I asked her about cardiac symptoms, she seemed very upfront about saying that she is not had any symptoms at all of  PND or orthopnea to go along with edema.  Her walking is limited more by her arthritis type pains and not by dyspnea. She denies any chest tightness or pressure with rest or exertion.  No rapid irregular heartbeats or palpitation symptoms. She does have labile blood pressures ranging anywhere from the current pressure here today as an average as low as 115/70 and then as high as 170s /190s.  She says she usually does go to the [facility] and does 30 minutes at a time on the stairstepper or elliptical which does not put stress on her joints.  She is a bit worried by the swelling, and is due to see Dr. early from Vascular Surgery soon.  Other cardiac review of symptoms are negative: No palpitations, lightheadedness, dizziness, weakness or syncope/near syncope. No TIA/amaurosis fugax symptoms. No claudication.  ROS: A comprehensive was performed. Review of Systems  Constitutional: Positive for weight loss (Roughly 12 pound diuresis over the last couple weeks). Negative for malaise/fatigue.  HENT: Negative for congestion.   Respiratory: Negative for cough, shortness of breath and wheezing.   Cardiovascular: Positive for leg swelling (Chronic, ongoing for years).       Per HPI  Musculoskeletal: Positive for joint pain.  Neurological: Negative for dizziness, focal weakness and weakness.       Somewhat unsteady gait, partially because of edema; does have some issues with balance instability secondary to multiple sclerosis.  Psychiatric/Behavioral: Negative for memory loss.  The patient is not nervous/anxious and does not have insomnia.   All other systems reviewed and are negative.   I have reviewed and (if needed) personally updated the patient's problem list, medications, allergies, past medical and surgical history, social and family history.   Past Medical History:  Diagnosis Date  . Chronic venous insufficiency of lower extremity 01/25/2019   With chronic bilateral lower extremity edema  .  Incontinent of urine   . Multiple sclerosis ([place]) 2015  . Osteoporosis    osteopenia  . Post-menopausal     Past Surgical History:  Procedure Laterality Date  . ABDOMINAL HYSTERECTOMY  1971   TVH  . breast ductectomy Right 05/1995  . COLONOSCOPY  10/2011    Current Meds  Medication Sig  . azelastine (ASTELIN) 137 MCG/SPRAY nasal spray Place 1 spray into the nose 2 (two) times daily as needed for allergies. Use in each nostril as directed   . Biotin 1 MG CAPS Take 1 capsule by mouth daily.   . Calcium Carbonate-Vitamin D (CALCIUM + D PO) Take 1 tablet by mouth 2 (two) times daily.   Marland Kitchen levothyroxine (SYNTHROID, LEVOTHROID) 25 MCG tablet Take 25 mcg by mouth daily.  . Magnesium 500 MG TABS Take 0.5 tablets by mouth every evening.   . Multiple Vitamins-Minerals (ICAPS AREDS FORMULA PO) Take by mouth.  . nystatin ointment (MYCOSTATIN) Apply to rectal area thinly twice daily for 7 days (Patient taking differently: Apply 1 application topically as needed. Apply to rectal area thinly twice daily for 7 days)  . triamcinolone cream (KENALOG) 0.1 % Apply topically as needed.  . Vitamin D, Ergocalciferol, (DRISDOL) 50000 UNITS CAPS capsule Take 1 capsule (50,000 Units total) by mouth every 7 (seven) days.  . vitamin E 100 UNIT capsule Take 100 Units by mouth daily.    Allergies  Allergen Reactions  . Sulfasalazine Anaphylaxis  . Aspirin Nausea Only  . Penicillins Diarrhea  . Sulfa Antibiotics     Social History   Tobacco Use  . Smoking status: Never Smoker  . Smokeless tobacco: Never Used  Substance Use Topics  . Alcohol use: No  . Drug use: No   Social History   Social History Narrative   Widowed mother of 2, grandmother 77 with 2 great-grandchildren.      Tries to workout at the [facility] several days a week 30 minutes at a time on stairstepper or elliptical.    family history includes Heart disease in her mother; Heart failure in her mother; Hypertension in her mother; Multiple  sclerosis in her cousin, cousin, and cousin; Osteoarthritis in her father; Stroke in her father.  Wt Readings from Last 3 Encounters:  01/25/19 137 lb (62.1 kg)  12/23/18 140 lb 8 oz (63.7 kg)  12/14/18 137 lb (62.1 kg)    PHYSICAL EXAM BP (!) 150/70   Pulse 65   Ht 5\' 5"  (1.651 m)   Wt 137 lb (62.1 kg)   LMP 09/08/1970 (Approximate)   BMI 22.80 kg/m  Physical Exam  Constitutional: She is oriented to person, place, and time. She appears well-developed and well-nourished. No distress.  Well-groomed  HENT:  Head: Normocephalic and atraumatic.  Mouth/Throat: Oropharynx is clear and moist.  Eyes: Conjunctivae and EOM are normal. No scleral icterus.  Neck: Normal range of motion. Neck supple. Hepatojugular reflux (Trace) present. No JVD present. Carotid bruit is not present.  Cardiovascular: Normal rate and regular rhythm.  No extrasystoles are present. PMI is not displaced. Exam reveals decreased  pulses (Difficult to palpate pedal pulses because of edema.  Bilateral radial pulses intact). Exam reveals no gallop and no friction rub.  Murmur heard.  Harsh crescendo-decrescendo early systolic murmur is present with a grade of 1/6 at the upper right sternal border radiating to the neck. Unable to hear HSMAt apex Pulmonary/Chest: Effort normal and breath sounds normal. No respiratory distress. She has no wheezes. She has no rales.  Abdominal: Soft. Bowel sounds are normal. She exhibits no distension. There is no abdominal tenderness. There is no rebound.  No HSM  Musculoskeletal: Normal range of motion.        General: Edema (Pitting 3+ edema from just above the knees down.) present.  Neurological: She is alert and oriented to person, place, and time. No cranial nerve deficit.  Skin: Skin is warm and dry. No erythema.  Venous stasis changes bilateral lower extremities.  Mild bruising areas along the right lower leg  Psychiatric: She has a normal mood and affect. Her behavior is normal.  Judgment and thought content normal.  Vitals reviewed.   Adult ECG Report  Rate: 65 ;  Rhythm: normal sinus rhythm and LAFB (-80).  Cannot exclude anterior MI, age undetermined.;   Narrative Interpretation: Borderline EKG   Other studies Reviewed: Additional studies/ records that were reviewed today include:  Recent Labs: No labs available   ASSESSMENT / PLAN: Problem List Items Addressed This Visit    Bilateral lower extremity edema (Chronic)    Bilateral lower extremity which is an ongoing thing for her.  Most likely related to venous stasis/venous hypertension.  She is due to see Dr. Donnetta Hutching for evaluation.  At this point, I do not think she really would benefit from diuretic as this seems to just dry her out.  In the setting of grade 2 diastolic dysfunction there is also a need for preload.  Recommend Ace wraps if not able to use support stockings.  Also recommend elevating her feet.  I shared with her the brochure for a patented pillow on for foot elevation.      Chronic venous insufficiency of lower extremity (Chronic)    The initial treatment regimen for venous stasis would be support stockings if able to wear, if not Ace wraps on Unna boot.  Also foot elevation. She is due to see Dr. Curt Jews, and will likely have venous duplexes to evaluate for macro microvascular venous reflux.  I think this is probably the main component as well as potentially even lymphedema for her lower extremity swelling.      Grade II diastolic dysfunction - Primary (Chronic)    Grade 2 diastolic dysfunction in an 85 year old woman with hypertension is basically almost normal for age.  And of itself it does not mean that she has diastolic heart failure, which is more of a clinical syndrome.  I do not think that the her edema is related to CHF, mostly because she does not have any PND or orthopnea.  Nor does she have exertional dyspnea.  In order for left ventricular diastolic dysfunction to affect lower  extremity edema, the symptoms would tend to be apparent.  She sees lots of doctors, and without really having true heart failure, I think she can simply follow-up with her PCP and will probably have better benefit from seeing the vascular surgeons to discuss her lower extremity edema.  Will defer hypertension management to PCP.  She has quite labile pressures and therefore would be reluctant to be overly aggressive for fear of  orthostatic hypotension.      Relevant Orders   EKG 12-Lead (Completed)     I spent a total of 40 minutes with the patient and chart review. >  50% of the time was spent in direct patient consultation.   Current medicines are reviewed at length with the patient today.  (+/- concerns) none.   The following changes have been made:  None.  Agree with no diuretic  Patient Instructions  Medication Instructions:  NOT NEEDED If you need a refill on your cardiac medications before your next appointment, please call your pharmacy.   Lab work: NOT NEEDED If you have labs (blood work) drawn today and your tests are completely normal, you will receive your results only by: Marland Kitchen MyChart Message (if you have MyChart) OR . A paper copy in the mail If you have any lab test that is abnormal or we need to change your treatment, we will call you to review the results.  Testing/Procedures: NOT NEEDED  Follow-Up: At [facility], you and your health needs are our priority.  As part of our continuing mission to provide you with exceptional heart care, we have created designated Provider Care Teams.  These Care Teams include your primary Cardiologist (physician) and Advanced Practice Providers (APPs -  Physician Assistants and Nurse Practitioners) who all work together to provide you with the care you need, when you need it. . Your physician recommends that you schedule a follow-up appointment ON AN AS NEEDED BASIS .   Any Other Special Instructions Will Be Listed Below (If  Applicable).   RECOMMEND  YOU ELEVATE LEGS MAY USE  LOUNGE DOCTOR   IF YOU CAN NOT USE SUPPOTRT OR COMPRESSION SOCKS - TRY USING ACE WRAPS DURING THE DAY AND THEN TAKE OFF AT NIGHT,     Studies Ordered:   Orders Placed This Encounter  Procedures  . EKG 12-Lead      Glenetta Hew, M.D., M.S. Interventional Cardiologist   Pager # [phone] Phone # [phone] [address] [place], [place] [address]   Thank you for choosing [facility] at [facility]!!

## 2019-01-25 NOTE — Patient Instructions (Addendum)
Medication Instructions:  NOT NEEDED If you need a refill on your cardiac medications before your next appointment, please call your pharmacy.   Lab work: NOT NEEDED If you have labs (blood work) drawn today and your tests are completely normal, you will receive your results only by: Marland Kitchen MyChart Message (if you have MyChart) OR . A paper copy in the mail If you have any lab test that is abnormal or we need to change your treatment, we will call you to review the results.  Testing/Procedures: NOT NEEDED  Follow-Up: At [facility], you and your health needs are our priority.  As part of our continuing mission to provide you with exceptional heart care, we have created designated Provider Care Teams.  These Care Teams include your primary Cardiologist (physician) and Advanced Practice Providers (APPs -  Physician Assistants and Nurse Practitioners) who all work together to provide you with the care you need, when you need it. . Your physician recommends that you schedule a follow-up appointment ON AN AS NEEDED BASIS .   Any Other Special Instructions Will Be Listed Below (If Applicable).   RECOMMEND  YOU ELEVATE LEGS MAY USE  LOUNGE DOCTOR   IF YOU CAN NOT USE SUPPOTRT OR COMPRESSION SOCKS - TRY USING ACE WRAPS DURING THE DAY AND THEN TAKE OFF AT NIGHT,

## 2019-01-27 ENCOUNTER — Encounter: Payer: Self-pay | Admitting: Cardiology

## 2019-01-27 NOTE — Assessment & Plan Note (Signed)
The initial treatment regimen for venous stasis would be support stockings if able to wear, if not Ace wraps on Unna boot.  Also foot elevation. She is due to see Dr. Curt Jews, and will likely have venous duplexes to evaluate for macro microvascular venous reflux.  I think this is probably the main component as well as potentially even lymphedema for her lower extremity swelling.

## 2019-01-27 NOTE — Assessment & Plan Note (Signed)
Bilateral lower extremity which is an ongoing thing for her.  Most likely related to venous stasis/venous hypertension.  She is due to see Dr. Donnetta Hutching for evaluation.  At this point, I do not think she really would benefit from diuretic as this seems to just dry her out.  In the setting of grade 2 diastolic dysfunction there is also a need for preload.  Recommend Ace wraps if not able to use support stockings.  Also recommend elevating her feet.  I shared with her the brochure for a patented pillow on for foot elevation.

## 2019-01-27 NOTE — Assessment & Plan Note (Signed)
Grade 2 diastolic dysfunction in an 85 year old woman with hypertension is basically almost normal for age.  And of itself it does not mean that she has diastolic heart failure, which is more of a clinical syndrome.  I do not think that the her edema is related to CHF, mostly because she does not have any PND or orthopnea.  Nor does she have exertional dyspnea.  In order for left ventricular diastolic dysfunction to affect lower extremity edema, the symptoms would tend to be apparent.  She sees lots of doctors, and without really having true heart failure, I think she can simply follow-up with her PCP and will probably have better benefit from seeing the vascular surgeons to discuss her lower extremity edema.  Will defer hypertension management to PCP.  She has quite labile pressures and therefore would be reluctant to be overly aggressive for fear of orthostatic hypotension.

## 2019-01-29 ENCOUNTER — Telehealth: Payer: Self-pay | Admitting: Hematology

## 2019-01-29 ENCOUNTER — Encounter: Payer: Self-pay | Admitting: Hematology

## 2019-01-29 NOTE — Telephone Encounter (Signed)
Received a new hem appt from Dr. Kathlene November at [facility] for abnormal m protein. I cld and scheduled [name] to see Dr. Irene Limbo on 3/17 at 1pm. She's been made aware to arrive 30 minutes early to be checked in on time. Letter mailed.

## 2019-01-30 DIAGNOSIS — H353132 Nonexudative age-related macular degeneration, bilateral, intermediate dry stage: Secondary | ICD-10-CM | POA: Diagnosis not present

## 2019-02-02 ENCOUNTER — Other Ambulatory Visit: Payer: Self-pay

## 2019-02-22 NOTE — Progress Notes (Signed)
HEMATOLOGY/ONCOLOGY CONSULTATION NOTE  Date of Service: 02/23/2019  Patient Care Team: [name], MD as PCP - General (Family Medicine) Leonie Man, MD as PCP - Cardiology (Cardiology)  CHIEF COMPLAINTS/PURPOSE OF CONSULTATION:  Monoclonal Paraproteinemia  HISTORY OF PRESENTING ILLNESS:   [name] is a wonderful 85 y.o. female who has been referred to [place] by Dr. Lahoma Rocker for evaluation and management of Monoclonal Paraproteinemia. She is accompanied today by her friend. The pt reports that she is doing well overall.   The pt reports that for the last 5 months she has had increased ankle, knee, wrist and knuckle pain, swelling, and redness. These symptoms improved after beginning Prednisone 6 weeks ago and is now on '20mg'$  Prednisone each day. She sees Dr. Meliton Rattan in Rheumatology for her RA. The pt notes that her joints improve within an hour of waking up, but is worst in the morning.  The pt notes that she has had abnormal thyroid functions for the last 4-5 years and has been on replacement. The pt also notes that her MS has been mostly stable, and she sees Dr. Arlice Colt in Neurology. She has not had relapses, and endorses some affect on her balance and endurance, but notes that she "is also getting older." [facility]  She notes that her chronic leg swelling is thought to be due to venous insufficiency.  Most recent lab results (01/08/19) of CBC w/diff and CMP is as follows: all values are WNL except for RBC at 4.08, HCT at 35.0, PLT at 550k, BUN at 21, ENI:DPOEUMPNTI at 30, ALB at 3.0, A/G ratio at 0.8. 01/08/19 SPEP revealed all values WNL except for Alpha-2 globulin at 1.1, Beta globulin at 1.7, M spike at 0.6g  On review of systems, pt reports chronic leg swelling, improving joint pains, staying active, good energy levels, eating well, and denies unexpected weight loss, new bone pains, new back pain, abdominal pains, and any  other symptoms.  On PMHx the pt reports inflammatory arthritis, polyarthritis, congestive heart failure, multiple sclerosis, Rheumatoid arthritis, Raynaud's disease, osteoporosis.   MEDICAL HISTORY:  Past Medical History:  Diagnosis Date  . Chronic venous insufficiency of lower extremity 01/25/2019   With chronic bilateral lower extremity edema  . Incontinent of urine   . Multiple sclerosis ([place]) 2015  . Osteoporosis    osteopenia  . Post-menopausal     SURGICAL HISTORY: Past Surgical History:  Procedure Laterality Date  . ABDOMINAL HYSTERECTOMY  1971   TVH  . breast ductectomy Right 05/1995  . COLONOSCOPY  10/2011    SOCIAL HISTORY: Social History   Socioeconomic History  . Marital status: Widowed    Spouse name: Not on file  . Number of children: 2  . Years of education: Not on file  . Highest education level: Not on file  Occupational History  . Not on file  Social Needs  . Financial resource strain: Not on file  . Food insecurity:    Worry: Not on file    Inability: Not on file  . Transportation needs:    Medical: Not on file    Non-medical: Not on file  Tobacco Use  . Smoking status: Never Smoker  . Smokeless tobacco: Never Used  Substance and Sexual Activity  . Alcohol use: No  . Drug use: No  . Sexual activity: Not Currently    Partners: Male    Birth control/protection: Post-menopausal, Surgical    Comment:  hysterectomy  Lifestyle  . Physical activity:    Days per week: Not on file    Minutes per session: Not on file  . Stress: Not on file  Relationships  . Social connections:    Talks on phone: Not on file    Gets together: Not on file    Attends religious service: Not on file    Active member of club or organization: Not on file    Attends meetings of clubs or organizations: Not on file    Relationship status: Not on file  . Intimate partner violence:    Fear of current or ex partner: Not on file    Emotionally abused: Not on file     Physically abused: Not on file    Forced sexual activity: Not on file  Other Topics Concern  . Not on file  Social History Narrative   Widowed mother of 2, grandmother 67 with 2 great-grandchildren.      Tries to workout at the [facility] several days a week 30 minutes at a time on stairstepper or elliptical.    FAMILY HISTORY: Family History  Problem Relation Age of Onset  . Osteoarthritis Father   . Stroke Father   . Heart failure Mother   . Hypertension Mother   . Heart disease Mother   . Multiple sclerosis Cousin   . Multiple sclerosis Cousin   . Multiple sclerosis Cousin     ALLERGIES:  is allergic to sulfasalazine; aspirin; penicillins; and sulfa antibiotics.  MEDICATIONS:  Current Outpatient Medications  Medication Sig Dispense Refill  . azelastine (ASTELIN) 137 MCG/SPRAY nasal spray Place 1 spray into the nose 2 (two) times daily as needed for allergies. Use in each nostril as directed     . Biotin 1 MG CAPS Take 1 capsule by mouth daily.     . Calcium Carbonate-Vitamin D (CALCIUM + D PO) Take 1 tablet by mouth 2 (two) times daily.     Marland Kitchen levothyroxine (SYNTHROID, LEVOTHROID) 25 MCG tablet Take 25 mcg by mouth daily.    . Magnesium 500 MG TABS Take 0.5 tablets by mouth every evening.     . Multiple Vitamins-Minerals (ICAPS AREDS FORMULA PO) Take by mouth.    . nystatin ointment (MYCOSTATIN) Apply to rectal area thinly twice daily for 7 days (Patient taking differently: Apply 1 application topically as needed. Apply to rectal area thinly twice daily for 7 days) 30 g 1  . predniSONE (DELTASONE) 5 MG tablet     . triamcinolone cream (KENALOG) 0.1 % Apply topically as needed. 30 g 4  . Vitamin D, Ergocalciferol, (DRISDOL) 50000 UNITS CAPS capsule Take 1 capsule (50,000 Units total) by mouth every 7 (seven) days. 30 capsule 3  . vitamin E 100 UNIT capsule Take 100 Units by mouth daily.     No current facility-administered medications for this visit.     REVIEW OF SYSTEMS:     10 Point review of Systems was done is negative except as noted above.  PHYSICAL EXAMINATION:  . Vitals:   02/23/19 1319  BP: (!) 141/74  Pulse: 68  Resp: 18  Temp: 97.7 F (36.5 C)  SpO2: 97%   Filed Weights   02/23/19 1319  Weight: 134 lb 3.2 oz (60.9 kg)   .Body mass index is 22.33 kg/m.  GENERAL:alert, in no acute distress and comfortable SKIN: no acute rashes, no significant lesions EYES: conjunctiva are pink and non-injected, sclera anicteric OROPHARYNX: MMM, no exudates, no oropharyngeal erythema or ulceration  NECK: supple, no JVD LYMPH:  no palpable lymphadenopathy in the cervical, axillary or inguinal regions LUNGS: clear to auscultation b/l with normal respiratory effort HEART: regular rate & rhythm ABDOMEN:  normoactive bowel sounds , non tender, not distended. Extremity: 2+ pedal edema bilaterally PSYCH: alert & oriented x 3 with fluent speech NEURO: no focal motor/sensory deficits  LABORATORY DATA:  I have reviewed the data as listed  . CBC Latest Ref Rng & Units 02/23/2019 12/26/2016 03/14/2009  WBC 4.0 - 10.5 K/uL 11.0(H) 5.7 6.5  Hemoglobin 12.0 - 15.0 g/dL 13.8 13.2 12.8  Hematocrit 36.0 - 46.0 % 43.2 37.5 36.9  Platelets 150 - 400 K/uL 260 162 146(L)   . CBC    Component Value Date/Time   WBC 11.0 (H) 02/23/2019 1444   RBC 4.76 02/23/2019 1444   HGB 13.8 02/23/2019 1444   HCT 43.2 02/23/2019 1444   PLT 260 02/23/2019 1444   MCV 90.8 02/23/2019 1444   MCH 29.0 02/23/2019 1444   MCHC 31.9 02/23/2019 1444   RDW 15.4 02/23/2019 1444   LYMPHSABS 1.7 02/23/2019 1444   MONOABS 0.6 02/23/2019 1444   EOSABS 0.0 02/23/2019 1444   BASOSABS 0.0 02/23/2019 1444     . CMP Latest Ref Rng & Units 02/23/2019 12/26/2016 04/17/2010  Glucose 70 - 99 mg/dL 112(H) 114(H) 121(H)  BUN 8 - 23 mg/dL 26(H) 16 13  Creatinine 0.44 - 1.00 mg/dL 0.82 0.60 0.81  Sodium 135 - 145 mmol/L 137 128(L) 130(L)  Potassium 3.5 - 5.1 mmol/L 4.7 4.1 4.3  Chloride 98 - 111  mmol/L 101 95(L) 95(L)  CO2 22 - 32 mmol/L '25 26 29  '$ Calcium 8.9 - 10.3 mg/dL 9.8 8.9 9.3  Total Protein 6.5 - 8.1 g/dL 7.6 6.6 -  Total Bilirubin 0.3 - 1.2 mg/dL 0.4 0.4 -  Alkaline Phos 38 - 126 U/L 74 74 -  AST 15 - 41 U/L 27 41 -  ALT 0 - 44 U/L 27 24 -   Component     Latest Ref Rng & Units 02/23/2019  IgG (Immunoglobin G), Serum     700 - 1,600 mg/dL 1,005  IgA     64 - 422 mg/dL 154  IgM (Immunoglobulin M), Srm     26 - 217 mg/dL 74  Total Protein ELP     6.0 - 8.5 g/dL 7.0  Albumin SerPl Elph-Mcnc     2.9 - 4.4 g/dL 4.0  Alpha 1     0.0 - 0.4 g/dL 0.1  Alpha2 Glob SerPl Elph-Mcnc     0.4 - 1.0 g/dL 0.9  B-Globulin SerPl Elph-Mcnc     0.7 - 1.3 g/dL 1.0  Gamma Glob SerPl Elph-Mcnc     0.4 - 1.8 g/dL 1.0  M Protein SerPl Elph-Mcnc     Not Observed g/dL 0.4 (H)  Globulin, Total     2.2 - 3.9 g/dL 3.0  Albumin/Glob SerPl     0.7 - 1.7 1.4  IFE 1      Comment  Please Note (HCV):      Comment  Kappa free light chain     3.3 - 19.4 mg/L 116.6 (H)  Lamda free light chains     5.7 - 26.3 mg/L 10.3  Kappa, lamda light chain ratio     0.26 - 1.65 11.32 (H)  Beta-2 Microglobulin     0.6 - 2.4 mg/L 2.0  LDH     98 - 192 U/L 248 (H)    01/08/19  SPEP:   01/08/19 CBC w/diff:       RADIOGRAPHIC STUDIES: I have personally reviewed the radiological images as listed and agreed with the findings in the report. No results found.  ASSESSMENT & PLAN:  85 y.o. female with  1. IgG Kappa Monoclonal Paraproteinemia likely MGUS related to her underlying autoimmune condition (RA) PLAN -Discussed patient's most recent labs from 01/08/19, M Protein at 0.6g in the context of having Rheumatoid Arthritis, HGB normal at 11.6. Creatinine normal at 0.7. -Discussed that the patient's Rheumatoid Arthritis is the most likely explanation for her monoclonal paraproteinemia, and treating her RA will likely improve her M spike -Discussed the CRAB criteria of multiple myeloma with the  pt: she has normal Calcium levels at 9.0. Renal function normal with creatinine at 0.7. No Anemia with HGB at 11.6. No new Bone pains. -Recommend continuing optimization of Vitamin D, calcium and Thyroid replacement with her PCP -Discussed that at this point, I would recommend additional labs today (as noted below). -No indication for a BM Bx given low index of suspicion for multiple myeloma at this time. -Will repeat labs again in 6 months to monitor for progression over time -Will see the pt back in 6 months -continue f/u with rheumatology to optimize rx of RA  Labs today RTC with Dr Irene Limbo with labs in 6 months. (labs 1 week prior to clinic visit)  . Orders Placed This Encounter  Procedures  . CBC with Differential/Platelet    Standing Status:   Future    Number of Occurrences:   1    Standing Expiration Date:   03/29/2020  . CMP ([place] only)    Standing Status:   Future    Number of Occurrences:   1    Standing Expiration Date:   02/23/2020  . Multiple Myeloma Panel (SPEP&IFE w/QIG)    Standing Status:   Future    Number of Occurrences:   1    Standing Expiration Date:   02/23/2020  . Kappa/lambda light chains    Standing Status:   Future    Number of Occurrences:   1    Standing Expiration Date:   03/29/2020  . Lactate dehydrogenase    Standing Status:   Future    Number of Occurrences:   1    Standing Expiration Date:   02/23/2020  . Beta 2 microglobulin, serum    Standing Status:   Future    Number of Occurrences:   1    Standing Expiration Date:   02/23/2020  . CBC with Differential/Platelet    Standing Status:   Future    Standing Expiration Date:   03/29/2020  . CMP ([place] only)    Standing Status:   Future    Standing Expiration Date:   02/23/2020  . Multiple Myeloma Panel (SPEP&IFE w/QIG)    Standing Status:   Future    Standing Expiration Date:   02/23/2020  . Kappa/lambda light chains    Standing Status:   Future    Standing Expiration Date:    03/29/2020    All of the patients questions were answered with apparent satisfaction. The patient knows to call the clinic with any problems, questions or concerns.  The total time spent in the appt was 45 minutes and more than 50% was on counseling and direct patient cares.   Sullivan Lone MD [place] AAHIVMS [facility] Hematology/Oncology Physician [facility]  (Office):       [phone] (Work  cell):  [phone] (Fax):           [phone]  02/23/2019 2:12 PM  I, Baldwin [place], am acting as a scribe for Dr. Sullivan Lone.   .I have reviewed the above documentation for accuracy and completeness, and I agree with the above. Brunetta Genera MD   ADDENDUM  Rpt Myeloma panel today showed improvement in M spike to 0.4 down from 0.6. Does have increased K/L ratio - which will be monitored. Brunetta Genera MD

## 2019-02-23 ENCOUNTER — Inpatient Hospital Stay: Payer: Medicare Other | Attending: Hematology | Admitting: Hematology

## 2019-02-23 ENCOUNTER — Other Ambulatory Visit: Payer: Self-pay

## 2019-02-23 ENCOUNTER — Telehealth: Payer: Self-pay | Admitting: Hematology

## 2019-02-23 ENCOUNTER — Inpatient Hospital Stay: Payer: Medicare Other

## 2019-02-23 VITALS — BP 141/74 | HR 68 | Temp 97.7°F | Resp 18 | Ht 65.0 in | Wt 134.2 lb

## 2019-02-23 DIAGNOSIS — M818 Other osteoporosis without current pathological fracture: Secondary | ICD-10-CM | POA: Diagnosis not present

## 2019-02-23 DIAGNOSIS — D472 Monoclonal gammopathy: Secondary | ICD-10-CM

## 2019-02-23 DIAGNOSIS — G35 Multiple sclerosis: Secondary | ICD-10-CM | POA: Diagnosis not present

## 2019-02-23 LAB — CBC WITH DIFFERENTIAL/PLATELET
Abs Immature Granulocytes: 0.03 10*3/uL (ref 0.00–0.07)
Basophils Absolute: 0 10*3/uL (ref 0.0–0.1)
Basophils Relative: 0 %
EOS PCT: 0 %
Eosinophils Absolute: 0 10*3/uL (ref 0.0–0.5)
HCT: 43.2 % (ref 36.0–46.0)
HEMOGLOBIN: 13.8 g/dL (ref 12.0–15.0)
Immature Granulocytes: 0 %
Lymphocytes Relative: 15 %
Lymphs Abs: 1.7 10*3/uL (ref 0.7–4.0)
MCH: 29 pg (ref 26.0–34.0)
MCHC: 31.9 g/dL (ref 30.0–36.0)
MCV: 90.8 fL (ref 80.0–100.0)
Monocytes Absolute: 0.6 10*3/uL (ref 0.1–1.0)
Monocytes Relative: 6 %
Neutro Abs: 8.6 10*3/uL — ABNORMAL HIGH (ref 1.7–7.7)
Neutrophils Relative %: 79 %
Platelets: 260 10*3/uL (ref 150–400)
RBC: 4.76 MIL/uL (ref 3.87–5.11)
RDW: 15.4 % (ref 11.5–15.5)
WBC: 11 10*3/uL — ABNORMAL HIGH (ref 4.0–10.5)
nRBC: 0 % (ref 0.0–0.2)

## 2019-02-23 LAB — LACTATE DEHYDROGENASE: LDH: 248 U/L — AB (ref 98–192)

## 2019-02-23 LAB — CMP (CANCER CENTER ONLY)
ALT: 27 U/L (ref 0–44)
AST: 27 U/L (ref 15–41)
Albumin: 4 g/dL (ref 3.5–5.0)
Alkaline Phosphatase: 74 U/L (ref 38–126)
Anion gap: 11 (ref 5–15)
BUN: 26 mg/dL — ABNORMAL HIGH (ref 8–23)
CO2: 25 mmol/L (ref 22–32)
Calcium: 9.8 mg/dL (ref 8.9–10.3)
Chloride: 101 mmol/L (ref 98–111)
Creatinine: 0.82 mg/dL (ref 0.44–1.00)
GFR, Est AFR Am: >60 mL/min (ref >60)
GFR, Estimated: >60 mL/min (ref >60)
Glucose, Bld: 112 mg/dL — ABNORMAL HIGH (ref 70–99)
Potassium: 4.7 mmol/L (ref 3.5–5.1)
Sodium: 137 mmol/L (ref 135–145)
Total Bilirubin: 0.4 mg/dL (ref 0.3–1.2)
Total Protein: 7.6 g/dL (ref 6.5–8.1)

## 2019-02-23 NOTE — Telephone Encounter (Signed)
Gave avs and calendar ° °

## 2019-02-24 LAB — MULTIPLE MYELOMA PANEL, SERUM
ALBUMIN/GLOB SERPL: 1.4 (ref 0.7–1.7)
Albumin SerPl Elph-Mcnc: 4 g/dL (ref 2.9–4.4)
Alpha 1: 0.1 g/dL (ref 0.0–0.4)
Alpha2 Glob SerPl Elph-Mcnc: 0.9 g/dL (ref 0.4–1.0)
B-GLOBULIN SERPL ELPH-MCNC: 1 g/dL (ref 0.7–1.3)
Gamma Glob SerPl Elph-Mcnc: 1 g/dL (ref 0.4–1.8)
Globulin, Total: 3 g/dL (ref 2.2–3.9)
IGM (IMMUNOGLOBULIN M), SRM: 74 mg/dL (ref 26–217)
IgA: 154 mg/dL (ref 64–422)
IgG (Immunoglobin G), Serum: 1005 mg/dL (ref 700–1600)
M Protein SerPl Elph-Mcnc: 0.4 g/dL — ABNORMAL HIGH
TOTAL PROTEIN ELP: 7 g/dL (ref 6.0–8.5)

## 2019-02-24 LAB — BETA 2 MICROGLOBULIN, SERUM: BETA 2 MICROGLOBULIN: 2 mg/L (ref 0.6–2.4)

## 2019-02-24 LAB — KAPPA/LAMBDA LIGHT CHAINS
KAPPA FREE LGHT CHN: 116.6 mg/L — AB (ref 3.3–19.4)
Kappa, lambda light chain ratio: 11.32 — ABNORMAL HIGH (ref 0.26–1.65)
Lambda free light chains: 10.3 mg/L (ref 5.7–26.3)

## 2019-03-01 DIAGNOSIS — H353132 Nonexudative age-related macular degeneration, bilateral, intermediate dry stage: Secondary | ICD-10-CM | POA: Diagnosis not present

## 2019-03-02 ENCOUNTER — Telehealth: Payer: Self-pay | Admitting: *Deleted

## 2019-03-02 NOTE — Telephone Encounter (Signed)
Patient requested ov notes and labs from 3/17 be faxed to Dr. Marisue Humble w/[facility] @ [phone]. Information faxed and confirmation received. Patient notified of same. Patient asked for note to be placed in chart with request for all ov to be sent to Dr. Marisue Humble.

## 2019-03-04 DIAGNOSIS — R700 Elevated erythrocyte sedimentation rate: Secondary | ICD-10-CM | POA: Diagnosis not present

## 2019-03-04 DIAGNOSIS — M130 Polyarthritis, unspecified: Secondary | ICD-10-CM | POA: Diagnosis not present

## 2019-03-04 DIAGNOSIS — M064 Inflammatory polyarthropathy: Secondary | ICD-10-CM | POA: Diagnosis not present

## 2019-03-04 DIAGNOSIS — M810 Age-related osteoporosis without current pathological fracture: Secondary | ICD-10-CM | POA: Diagnosis not present

## 2019-03-04 DIAGNOSIS — G35 Multiple sclerosis: Secondary | ICD-10-CM | POA: Diagnosis not present

## 2019-03-04 DIAGNOSIS — I509 Heart failure, unspecified: Secondary | ICD-10-CM | POA: Diagnosis not present

## 2019-03-04 DIAGNOSIS — M0579 Rheumatoid arthritis with rheumatoid factor of multiple sites without organ or systems involvement: Secondary | ICD-10-CM | POA: Diagnosis not present

## 2019-03-04 DIAGNOSIS — R809 Proteinuria, unspecified: Secondary | ICD-10-CM | POA: Diagnosis not present

## 2019-03-04 DIAGNOSIS — Z79899 Other long term (current) drug therapy: Secondary | ICD-10-CM | POA: Diagnosis not present

## 2019-03-04 DIAGNOSIS — M7989 Other specified soft tissue disorders: Secondary | ICD-10-CM | POA: Diagnosis not present

## 2019-03-04 DIAGNOSIS — M79643 Pain in unspecified hand: Secondary | ICD-10-CM | POA: Diagnosis not present

## 2019-03-04 DIAGNOSIS — I878 Other specified disorders of veins: Secondary | ICD-10-CM | POA: Diagnosis not present

## 2019-03-05 DIAGNOSIS — E871 Hypo-osmolality and hyponatremia: Secondary | ICD-10-CM | POA: Diagnosis not present

## 2019-03-05 DIAGNOSIS — R609 Edema, unspecified: Secondary | ICD-10-CM | POA: Diagnosis not present

## 2019-03-05 DIAGNOSIS — G35 Multiple sclerosis: Secondary | ICD-10-CM | POA: Diagnosis not present

## 2019-03-05 DIAGNOSIS — M810 Age-related osteoporosis without current pathological fracture: Secondary | ICD-10-CM | POA: Diagnosis not present

## 2019-03-05 DIAGNOSIS — M1990 Unspecified osteoarthritis, unspecified site: Secondary | ICD-10-CM | POA: Diagnosis not present

## 2019-03-05 DIAGNOSIS — D692 Other nonthrombocytopenic purpura: Secondary | ICD-10-CM | POA: Diagnosis not present

## 2019-03-05 DIAGNOSIS — E039 Hypothyroidism, unspecified: Secondary | ICD-10-CM | POA: Diagnosis not present

## 2019-03-05 DIAGNOSIS — E559 Vitamin D deficiency, unspecified: Secondary | ICD-10-CM | POA: Diagnosis not present

## 2019-03-08 ENCOUNTER — Telehealth: Payer: Self-pay | Admitting: *Deleted

## 2019-03-08 NOTE — Telephone Encounter (Signed)
Medical records faxed to [facility]; [place] [phone]

## 2019-03-09 ENCOUNTER — Encounter (HOSPITAL_COMMUNITY): Payer: Medicare Other

## 2019-03-09 ENCOUNTER — Encounter: Payer: Medicare Other | Admitting: Vascular Surgery

## 2019-03-17 ENCOUNTER — Encounter: Payer: Self-pay | Admitting: Obstetrics and Gynecology

## 2019-03-17 ENCOUNTER — Ambulatory Visit: Payer: Medicare Other | Admitting: Podiatry

## 2019-04-06 DIAGNOSIS — M79643 Pain in unspecified hand: Secondary | ICD-10-CM | POA: Diagnosis not present

## 2019-04-06 DIAGNOSIS — D472 Monoclonal gammopathy: Secondary | ICD-10-CM | POA: Diagnosis not present

## 2019-04-06 DIAGNOSIS — I878 Other specified disorders of veins: Secondary | ICD-10-CM | POA: Diagnosis not present

## 2019-04-06 DIAGNOSIS — M0579 Rheumatoid arthritis with rheumatoid factor of multiple sites without organ or systems involvement: Secondary | ICD-10-CM | POA: Diagnosis not present

## 2019-04-06 DIAGNOSIS — G35 Multiple sclerosis: Secondary | ICD-10-CM | POA: Diagnosis not present

## 2019-04-06 DIAGNOSIS — M7989 Other specified soft tissue disorders: Secondary | ICD-10-CM | POA: Diagnosis not present

## 2019-04-06 DIAGNOSIS — M810 Age-related osteoporosis without current pathological fracture: Secondary | ICD-10-CM | POA: Diagnosis not present

## 2019-04-06 DIAGNOSIS — Z79899 Other long term (current) drug therapy: Secondary | ICD-10-CM | POA: Diagnosis not present

## 2019-04-06 DIAGNOSIS — I509 Heart failure, unspecified: Secondary | ICD-10-CM | POA: Diagnosis not present

## 2019-04-07 DIAGNOSIS — Z79899 Other long term (current) drug therapy: Secondary | ICD-10-CM | POA: Diagnosis not present

## 2019-04-07 DIAGNOSIS — M0579 Rheumatoid arthritis with rheumatoid factor of multiple sites without organ or systems involvement: Secondary | ICD-10-CM | POA: Diagnosis not present

## 2019-05-06 DIAGNOSIS — G35 Multiple sclerosis: Secondary | ICD-10-CM | POA: Diagnosis not present

## 2019-05-06 DIAGNOSIS — M7989 Other specified soft tissue disorders: Secondary | ICD-10-CM | POA: Diagnosis not present

## 2019-05-06 DIAGNOSIS — M79643 Pain in unspecified hand: Secondary | ICD-10-CM | POA: Diagnosis not present

## 2019-05-06 DIAGNOSIS — M810 Age-related osteoporosis without current pathological fracture: Secondary | ICD-10-CM | POA: Diagnosis not present

## 2019-05-06 DIAGNOSIS — Z79899 Other long term (current) drug therapy: Secondary | ICD-10-CM | POA: Diagnosis not present

## 2019-05-06 DIAGNOSIS — I509 Heart failure, unspecified: Secondary | ICD-10-CM | POA: Diagnosis not present

## 2019-05-06 DIAGNOSIS — M0579 Rheumatoid arthritis with rheumatoid factor of multiple sites without organ or systems involvement: Secondary | ICD-10-CM | POA: Diagnosis not present

## 2019-05-06 DIAGNOSIS — I878 Other specified disorders of veins: Secondary | ICD-10-CM | POA: Diagnosis not present

## 2019-05-06 DIAGNOSIS — D472 Monoclonal gammopathy: Secondary | ICD-10-CM | POA: Diagnosis not present

## 2019-05-07 DIAGNOSIS — M0579 Rheumatoid arthritis with rheumatoid factor of multiple sites without organ or systems involvement: Secondary | ICD-10-CM | POA: Diagnosis not present

## 2019-05-07 DIAGNOSIS — N390 Urinary tract infection, site not specified: Secondary | ICD-10-CM | POA: Diagnosis not present

## 2019-05-12 ENCOUNTER — Encounter: Payer: Self-pay | Admitting: Obstetrics & Gynecology

## 2019-05-12 DIAGNOSIS — Z1231 Encounter for screening mammogram for malignant neoplasm of breast: Secondary | ICD-10-CM | POA: Diagnosis not present

## 2019-05-17 ENCOUNTER — Encounter (HOSPITAL_COMMUNITY): Payer: Medicare Other

## 2019-05-17 ENCOUNTER — Encounter: Payer: Medicare Other | Admitting: Surgery

## 2019-05-24 ENCOUNTER — Encounter: Payer: Self-pay | Admitting: Obstetrics & Gynecology

## 2019-05-24 ENCOUNTER — Other Ambulatory Visit: Payer: Self-pay

## 2019-05-24 ENCOUNTER — Ambulatory Visit (INDEPENDENT_AMBULATORY_CARE_PROVIDER_SITE_OTHER): Payer: Medicare Other | Admitting: Obstetrics & Gynecology

## 2019-05-24 VITALS — BP 130/74 | HR 76 | Temp 97.0°F | Ht 65.5 in

## 2019-05-24 DIAGNOSIS — Z124 Encounter for screening for malignant neoplasm of cervix: Secondary | ICD-10-CM

## 2019-05-24 DIAGNOSIS — K6289 Other specified diseases of anus and rectum: Secondary | ICD-10-CM | POA: Diagnosis not present

## 2019-05-24 DIAGNOSIS — L290 Pruritus ani: Secondary | ICD-10-CM

## 2019-05-24 DIAGNOSIS — Z01419 Encounter for gynecological examination (general) (routine) without abnormal findings: Secondary | ICD-10-CM | POA: Diagnosis not present

## 2019-05-24 NOTE — Patient Instructions (Signed)
Boudreaux's Butt Paste.  Apply at night.

## 2019-05-24 NOTE — Progress Notes (Signed)
85 y.o. G2P2 Widowed White or Caucasian female here for annual exam/problem visit.  Denies vaginal bleeding.  Long term patient of [name]'s.  Pt has issues with urinary leakage that is common with MS.  She does have daily AM bowel movement but has a little bit of stool seepage afterwards.  She does have some rectal irritation that can be very significant.  She has been trying to eat more yogurt to see if this helps.  Has used nystatin and balmex on the skin.  If she stops, she will have recurrent symptoms.  Nystatin alone doesn't seem to be enough.  Balmex alone doesn't help.  Wears a pad daily.    Neurology:  Dr. Felecia Shelling.   Ortho:  Dr. Sharol Given Hematologist:  Dr. Irene Limbo.  Will not have follow-up unless new issues.   Rheumatologist:  Dr. Kathlene November Cardiology:  Dr. Ellyn Hack.  Echo done 12/16/2018 that was good.  Will see him as needed.  Patient's last menstrual period was 09/08/1970 (approximate).          Sexually active: No.  The current method of family planning is post menopausal status.    Exercising: Yes.    stretching. and stairs  Smoker:  no  Health Maintenance: Pap: 05/04/15 Neg; 06/12/2000 Neg History of abnormal Pap:  no MMG:  05/06/18 density C Bi-rads1 neg  Colonoscopy:  10/2011 normal repeat in 10 years BMD:    02/21/16 Osteoporosis Total spine: --3.2; R Femur Neck: -2.9; L Femur Neck: -3.2  TDaP:  pcp Pneumonia vaccine(s):  2015 Shingrix:   2015 Hep C testing: pcp Screening Labs: pcp   reports that she has never smoked. She has never used smokeless tobacco. She reports that she does not drink alcohol or use drugs.  Past Medical History:  Diagnosis Date  . Chronic venous insufficiency of lower extremity 01/25/2019   With chronic bilateral lower extremity edema  . Incontinent of urine   . Multiple sclerosis ([place]) 2015  . Osteoporosis    osteopenia  . Polyarthritis   . Post-menopausal   . Rheumatoid arthritis ([place])   . Temporal arteritis ([place]) 2011    Past Surgical History:   Procedure Laterality Date  . ABDOMINAL HYSTERECTOMY  1971   TVH  . breast ductectomy Right 05/1995  . COLONOSCOPY  10/2011    Current Outpatient Medications  Medication Sig Dispense Refill  . azelastine (ASTELIN) 137 MCG/SPRAY nasal spray Place 1 spray into the nose 2 (two) times daily as needed for allergies. Use in each nostril as directed     . Biotin 1 MG CAPS Take 1 capsule by mouth daily.     . Calcium Carbonate-Vitamin D (CALCIUM + D PO) Take 1 tablet by mouth 2 (two) times daily.     Marland Kitchen levothyroxine (SYNTHROID, LEVOTHROID) 25 MCG tablet Take 25 mcg by mouth daily.    . Magnesium 500 MG TABS Take 0.5 tablets by mouth every evening.     . Multiple Vitamins-Minerals (ICAPS AREDS FORMULA PO) Take by mouth.    . nystatin ointment (MYCOSTATIN) Apply to rectal area thinly twice daily for 7 days (Patient taking differently: Apply 1 application topically as needed. Apply to rectal area thinly twice daily for 7 days) 30 g 1  . predniSONE (DELTASONE) 5 MG tablet     . triamcinolone cream (KENALOG) 0.1 % Apply topically as needed. 30 g 4  . Vitamin D, Ergocalciferol, (DRISDOL) 50000 UNITS CAPS capsule Take 1 capsule (50,000 Units total) by mouth every 7 (seven) days.  30 capsule 3  . vitamin E 100 UNIT capsule Take 100 Units by mouth daily.     No current facility-administered medications for this visit.     Family History  Problem Relation Age of Onset  . Osteoarthritis Father   . Stroke Father   . Heart failure Mother   . Hypertension Mother   . Heart disease Mother   . Multiple sclerosis Cousin   . Multiple sclerosis Cousin   . Multiple sclerosis Cousin     Review of Systems  All other systems reviewed and are negative.   Exam:   Vitals:   05/24/19 1444  BP: 130/74  Pulse: 76  Temp: (!) 97 F (36.1 C)    General appearance: alert, cooperative and appears stated age Head: Normocephalic, without obvious abnormality, atraumatic Neck: no adenopathy, supple, symmetrical,  trachea midline and thyroid normal to inspection and palpation Lungs: clear to auscultation bilaterally Breasts: normal appearance, no masses or tenderness Heart: regular rate and rhythm Abdomen: soft, non-tender; bowel sounds normal; no masses,  no organomegaly Extremities: extremities normal, atraumatic, no cyanosis or edema Skin: Skin color, texture, turgor normal. No rashes or lesions Lymph nodes: Cervical, supraclavicular, and axillary nodes normal. No abnormal inguinal nodes palpated Neurologic: Grossly normal   Pelvic: External genitalia:  no lesions              Urethra:  normal appearing urethra with no masses, tenderness or lesions              Bartholins and Skenes: normal                 Vagina: normal appearing vagina with normal color and discharge, no lesions              Cervix: no lesions              Pap taken: No. Bimanual Exam:  Uterus:  normal size, contour, position, consistency, mobility, non-tender              Adnexa: normal adnexa and no mass, fullness, tenderness               Rectovaginal: Confirms               Anus:  normal sphincter tone, no lesions  Chaperone was present for exam.  A:  Well Woman with normal exam PMP, no HT Atrophic vaginal changes s/p TVH 1971 H/O Vit D  Hypothyroidism MS, followed by Dr. Felecia Shelling Osteoporosis followed by Dr. Chalmers Cater (has been on Forteo and Reclast)  P:   Mammogram guidelines reviewed pap smear not indicated Lab work done with Dr. Dorris Carnes methods for skin protection around rectal skin Affirm pending Return annually or prn

## 2019-05-25 LAB — VAGINITIS/VAGINOSIS, DNA PROBE
Candida Species: NEGATIVE
Gardnerella vaginalis: NEGATIVE
Trichomonas vaginosis: NEGATIVE

## 2019-05-29 DIAGNOSIS — H353132 Nonexudative age-related macular degeneration, bilateral, intermediate dry stage: Secondary | ICD-10-CM | POA: Diagnosis not present

## 2019-05-31 ENCOUNTER — Other Ambulatory Visit: Payer: Self-pay

## 2019-05-31 ENCOUNTER — Encounter: Payer: Self-pay | Admitting: Podiatry

## 2019-05-31 ENCOUNTER — Ambulatory Visit (INDEPENDENT_AMBULATORY_CARE_PROVIDER_SITE_OTHER): Payer: Medicare Other | Admitting: Podiatry

## 2019-05-31 DIAGNOSIS — M79675 Pain in left toe(s): Secondary | ICD-10-CM

## 2019-05-31 DIAGNOSIS — B351 Tinea unguium: Secondary | ICD-10-CM

## 2019-05-31 DIAGNOSIS — L84 Corns and callosities: Secondary | ICD-10-CM

## 2019-05-31 DIAGNOSIS — G35 Multiple sclerosis: Secondary | ICD-10-CM | POA: Diagnosis not present

## 2019-05-31 DIAGNOSIS — M79674 Pain in right toe(s): Secondary | ICD-10-CM

## 2019-05-31 NOTE — Patient Instructions (Signed)

## 2019-06-08 NOTE — Progress Notes (Signed)
Subjective:  [name] presents to clinic today with cc of  painful, thick, discolored, elongated toenails 1-5 b/l that become tender and cannot cut because of thickness.  [name] has history of multiple sclerosis skin she has a new diagnosis of rheumatoid arthritis is well.  Pain is aggravated when wearing enclosed shoe gear.  Gabriela Arabian, MD is her PCP.  Last visit February 24, 2019.   Current Outpatient Medications:  .  azelastine (ASTELIN) 137 MCG/SPRAY nasal spray, Place 1 spray into the nose 2 (two) times daily as needed for allergies. Use in each nostril as directed , Disp: , Rfl:  .  Biotin 1 MG CAPS, Take 1 capsule by mouth daily. , Disp: , Rfl:  .  Calcium Carbonate-Vitamin D (CALCIUM + D PO), Take 1 tablet by mouth 2 (two) times daily. , Disp: , Rfl:  .  levothyroxine (SYNTHROID, LEVOTHROID) 25 MCG tablet, Take 25 mcg by mouth daily., Disp: , Rfl:  .  Magnesium 500 MG TABS, Take 0.5 tablets by mouth every evening. , Disp: , Rfl:  .  methotrexate 2.5 MG tablet, , Disp: , Rfl:  .  Multiple Vitamins-Minerals (ICAPS AREDS FORMULA PO), Take by mouth., Disp: , Rfl:  .  nystatin ointment (MYCOSTATIN), Apply to rectal area thinly twice daily for 7 days (Patient taking differently: Apply 1 application topically as needed. Apply to rectal area thinly twice daily for 7 days), Disp: 30 g, Rfl: 1 .  predniSONE (DELTASONE) 5 MG tablet, , Disp: , Rfl:  .  triamcinolone cream (KENALOG) 0.1 %, Apply topically as needed., Disp: 30 g, Rfl: 4 .  Vitamin D, Ergocalciferol, (DRISDOL) 50000 UNITS CAPS capsule, Take 1 capsule (50,000 Units total) by mouth every 7 (seven) days., Disp: 30 capsule, Rfl: 3 .  vitamin E 100 UNIT capsule, Take 100 Units by mouth daily., Disp: , Rfl:    Allergies  Allergen Reactions  . Sulfasalazine Anaphylaxis  . Aspirin Nausea Only  . Penicillins Diarrhea  . Sulfa Antibiotics      Objective: There were no vitals filed for this visit.  Physical  Examination:  Vascular Examination: Capillary refill time immediate x 10 digits.  Palpable DP/PT pulses b/l.  Digital hair present b/l.  No edema noted b/l.  Skin temperature gradient WNL b/l.  Dermatological Examination: Skin with normal turgor, texture and tone b/l.  No open wounds b/l.  No interdigital macerations noted b/l.  Elongated, thick, discolored brittle toenails with subungual debris and pain on dorsal palpation of nailbeds 1-5 b/l.  Hyperkeratotic lesions distal tips of left second second and right third digits and submet head 5 bilaterally with tenderness to palpation. No edema, no erythema, no drainage, no flocculence.  Musculoskeletal Examination: Muscle strength 5/5 to all muscle groups b/l  Hammertoes bilaterally.  No pain, crepitus or joint discomfort with active/passive ROM.  Neurological Examination: Sensation intact 5/5 b/l with 10 gram monofilament.  Vibratory sensation intact b/l.  Assessment: Mycotic nail infection with pain 1-5 b/l Calluses submetatarsal head 5 bilaterally Corns distal tips left second second and right third digits Multiple sclerosis  Plan: 1. Toenails 1-5 b/l were debrided in length and girth without iatrogenic laceration. Calluses pared submetatarsal head(s) 5 bilaterally utilizing sterile scalpel blade without incident.Corn(s) pared left second second and right third digits utilizing sterile scalpel blade without incident. Continue soft, supportive shoe gear daily. Report any pedal injuries to medical professional. Follow up 3 months. Patient/POA to call should there be a question/concern in there interim.

## 2019-06-10 DIAGNOSIS — M62838 Other muscle spasm: Secondary | ICD-10-CM | POA: Diagnosis not present

## 2019-06-16 DIAGNOSIS — I509 Heart failure, unspecified: Secondary | ICD-10-CM | POA: Diagnosis not present

## 2019-06-16 DIAGNOSIS — I878 Other specified disorders of veins: Secondary | ICD-10-CM | POA: Diagnosis not present

## 2019-06-16 DIAGNOSIS — M810 Age-related osteoporosis without current pathological fracture: Secondary | ICD-10-CM | POA: Diagnosis not present

## 2019-06-16 DIAGNOSIS — D472 Monoclonal gammopathy: Secondary | ICD-10-CM | POA: Diagnosis not present

## 2019-06-16 DIAGNOSIS — M0579 Rheumatoid arthritis with rheumatoid factor of multiple sites without organ or systems involvement: Secondary | ICD-10-CM | POA: Diagnosis not present

## 2019-06-16 DIAGNOSIS — Z79899 Other long term (current) drug therapy: Secondary | ICD-10-CM | POA: Diagnosis not present

## 2019-06-16 DIAGNOSIS — M79651 Pain in right thigh: Secondary | ICD-10-CM | POA: Diagnosis not present

## 2019-06-16 DIAGNOSIS — M79643 Pain in unspecified hand: Secondary | ICD-10-CM | POA: Diagnosis not present

## 2019-06-16 DIAGNOSIS — G35 Multiple sclerosis: Secondary | ICD-10-CM | POA: Diagnosis not present

## 2019-06-16 DIAGNOSIS — M7989 Other specified soft tissue disorders: Secondary | ICD-10-CM | POA: Diagnosis not present

## 2019-06-16 DIAGNOSIS — R252 Cramp and spasm: Secondary | ICD-10-CM | POA: Diagnosis not present

## 2019-06-25 DIAGNOSIS — H353131 Nonexudative age-related macular degeneration, bilateral, early dry stage: Secondary | ICD-10-CM | POA: Diagnosis not present

## 2019-06-25 DIAGNOSIS — Z961 Presence of intraocular lens: Secondary | ICD-10-CM | POA: Diagnosis not present

## 2019-06-28 DIAGNOSIS — H353132 Nonexudative age-related macular degeneration, bilateral, intermediate dry stage: Secondary | ICD-10-CM | POA: Diagnosis not present

## 2019-06-30 DIAGNOSIS — M810 Age-related osteoporosis without current pathological fracture: Secondary | ICD-10-CM | POA: Diagnosis not present

## 2019-07-06 DIAGNOSIS — H353132 Nonexudative age-related macular degeneration, bilateral, intermediate dry stage: Secondary | ICD-10-CM | POA: Diagnosis not present

## 2019-07-06 DIAGNOSIS — H35371 Puckering of macula, right eye: Secondary | ICD-10-CM | POA: Diagnosis not present

## 2019-07-15 DIAGNOSIS — I509 Heart failure, unspecified: Secondary | ICD-10-CM | POA: Diagnosis not present

## 2019-07-15 DIAGNOSIS — M810 Age-related osteoporosis without current pathological fracture: Secondary | ICD-10-CM | POA: Diagnosis not present

## 2019-07-15 DIAGNOSIS — D472 Monoclonal gammopathy: Secondary | ICD-10-CM | POA: Diagnosis not present

## 2019-07-15 DIAGNOSIS — I878 Other specified disorders of veins: Secondary | ICD-10-CM | POA: Diagnosis not present

## 2019-07-15 DIAGNOSIS — Z79899 Other long term (current) drug therapy: Secondary | ICD-10-CM | POA: Diagnosis not present

## 2019-07-15 DIAGNOSIS — M0579 Rheumatoid arthritis with rheumatoid factor of multiple sites without organ or systems involvement: Secondary | ICD-10-CM | POA: Diagnosis not present

## 2019-07-15 DIAGNOSIS — M7989 Other specified soft tissue disorders: Secondary | ICD-10-CM | POA: Diagnosis not present

## 2019-07-15 DIAGNOSIS — G35 Multiple sclerosis: Secondary | ICD-10-CM | POA: Diagnosis not present

## 2019-07-15 DIAGNOSIS — M79643 Pain in unspecified hand: Secondary | ICD-10-CM | POA: Diagnosis not present

## 2019-07-28 DIAGNOSIS — H353132 Nonexudative age-related macular degeneration, bilateral, intermediate dry stage: Secondary | ICD-10-CM | POA: Diagnosis not present

## 2019-08-03 ENCOUNTER — Ambulatory Visit (INDEPENDENT_AMBULATORY_CARE_PROVIDER_SITE_OTHER): Payer: Medicare Other | Admitting: Podiatry

## 2019-08-03 ENCOUNTER — Encounter: Payer: Self-pay | Admitting: Podiatry

## 2019-08-03 ENCOUNTER — Other Ambulatory Visit: Payer: Self-pay

## 2019-08-03 VITALS — Temp 97.9°F

## 2019-08-03 DIAGNOSIS — G35 Multiple sclerosis: Secondary | ICD-10-CM | POA: Diagnosis not present

## 2019-08-03 DIAGNOSIS — M79674 Pain in right toe(s): Secondary | ICD-10-CM | POA: Diagnosis not present

## 2019-08-03 DIAGNOSIS — B351 Tinea unguium: Secondary | ICD-10-CM | POA: Diagnosis not present

## 2019-08-03 DIAGNOSIS — M79675 Pain in left toe(s): Secondary | ICD-10-CM

## 2019-08-03 DIAGNOSIS — L84 Corns and callosities: Secondary | ICD-10-CM

## 2019-08-03 DIAGNOSIS — G35D Multiple sclerosis, unspecified: Secondary | ICD-10-CM

## 2019-08-03 NOTE — Patient Instructions (Signed)

## 2019-08-11 NOTE — Progress Notes (Signed)
Subjective: [name] presents today with history of MS and RA. Patient seen for follow up of chronic, painful mycotic toenails, corns and calluses which interfere with daily activities and routine tasks.  Pain is aggravated when wearing enclosed shoe gear. Pain is getting progressively worse and relieved with periodic professional debridement.     Current Outpatient Medications:  .  azelastine (ASTELIN) 137 MCG/SPRAY nasal spray, Place 1 spray into the nose 2 (two) times daily as needed for allergies. Use in each nostril as directed , Disp: , Rfl:  .  Biotin 1 MG CAPS, Take 1 capsule by mouth daily. , Disp: , Rfl:  .  Calcium Carbonate-Vitamin D (CALCIUM + D PO), Take 1 tablet by mouth 2 (two) times daily. , Disp: , Rfl:  .  levothyroxine (SYNTHROID, LEVOTHROID) 25 MCG tablet, Take 25 mcg by mouth daily., Disp: , Rfl:  .  Magnesium 500 MG TABS, Take 0.5 tablets by mouth every evening. , Disp: , Rfl:  .  methotrexate 2.5 MG tablet, , Disp: , Rfl:  .  Multiple Vitamins-Minerals (ICAPS AREDS FORMULA PO), Take by mouth., Disp: , Rfl:  .  nystatin ointment (MYCOSTATIN), Apply to rectal area thinly twice daily for 7 days (Patient taking differently: Apply 1 application topically as needed. Apply to rectal area thinly twice daily for 7 days), Disp: 30 g, Rfl: 1 .  predniSONE (DELTASONE) 5 MG tablet, , Disp: , Rfl:  .  triamcinolone cream (KENALOG) 0.1 %, Apply topically as needed., Disp: 30 g, Rfl: 4 .  Vitamin D, Ergocalciferol, (DRISDOL) 50000 UNITS CAPS capsule, Take 1 capsule (50,000 Units total) by mouth every 7 (seven) days., Disp: 30 capsule, Rfl: 3 .  vitamin E 100 UNIT capsule, Take 100 Units by mouth daily., Disp: , Rfl:   Allergies  Allergen Reactions  . Sulfasalazine Anaphylaxis  . Aspirin Nausea Only  . Penicillins Diarrhea  . Sulfa Antibiotics     Objective: Vitals:   08/03/19 0908  Temp: 97.9 F (36.6 C)    Vascular Examination: Capillary refill time immediate  x 10 digits.  Dorsalis pedis pulses present b/l.  Posterior tibial pulses present b/l.  + Digital hair x 10 digits.  Skin temperature WNL b/l.  Dermatological Examination: Skin with normal turgor, texture and tone b/l.  Toenails 1-5 b/l discolored, thick, dystrophic with subungual debris and pain with palpation to nailbeds due to thickness of nails.  Hyperkeratotic lesion(s) submet head 5 left foot, distal tip left 2nd and b/l 3rd digits. No erythema, no edema, no drainage, no flocculence noted.   Musculoskeletal: Muscle strength 5/5 to all LE muscle groups.  Hammertoe deformities lesser digits, rigid in nature b/l.  Neurological: Sensation intact 5/5 b/l with 10 gram monofilament.  Vibratory sensation intact b/l.  Assessment: 1. Painful onychomycosis toenails 1-5 b/l 2. Callus submet head 5 b/l 3. Corns left 2nd, b/l 3rd digits 4. Multiple sclerosis  Plan: 1. Toenails 1-5 b/l were debrided in length and girth without iatrogenic bleeding. 2. Calluses pared submetatarsal head(s) 5 left foot utilizing sterile scalpel blade without incident. Corn(s) left 2nd, b/l 3rd digits pared utilizing sterile scalpel blade without incident.  3. Patient to continue soft, supportive shoe gear daily. 4. Patient to report any pedal injuries to medical professional immediately. 5. Follow up 9 weeks. 6. Patient/POA to call should there be a concern in the interim.

## 2019-08-13 ENCOUNTER — Telehealth: Payer: Self-pay | Admitting: *Deleted

## 2019-08-13 NOTE — Telephone Encounter (Signed)
Patient called, LVM- seen once in March as referral from Dr. Kathlene November. Was to return in 6 months. Pt called to cancel further appts 9/16 and 9/23, stating Dr. Kathlene November was satisfied with second opinion and she didn't need to return.  Attempted to contact patient to inform that appointments were cancelled as she requested. Encouraged her to call office if she or her provider had further concerns or questions

## 2019-08-25 ENCOUNTER — Other Ambulatory Visit: Payer: Medicare Other

## 2019-08-27 DIAGNOSIS — H353132 Nonexudative age-related macular degeneration, bilateral, intermediate dry stage: Secondary | ICD-10-CM | POA: Diagnosis not present

## 2019-09-01 ENCOUNTER — Ambulatory Visit: Payer: Medicare Other | Admitting: Hematology

## 2019-09-01 DIAGNOSIS — E039 Hypothyroidism, unspecified: Secondary | ICD-10-CM | POA: Diagnosis not present

## 2019-09-01 DIAGNOSIS — Z0000 Encounter for general adult medical examination without abnormal findings: Secondary | ICD-10-CM | POA: Diagnosis not present

## 2019-09-01 DIAGNOSIS — R609 Edema, unspecified: Secondary | ICD-10-CM | POA: Diagnosis not present

## 2019-09-01 DIAGNOSIS — M810 Age-related osteoporosis without current pathological fracture: Secondary | ICD-10-CM | POA: Diagnosis not present

## 2019-09-01 DIAGNOSIS — E559 Vitamin D deficiency, unspecified: Secondary | ICD-10-CM | POA: Diagnosis not present

## 2019-09-01 DIAGNOSIS — E871 Hypo-osmolality and hyponatremia: Secondary | ICD-10-CM | POA: Diagnosis not present

## 2019-09-01 DIAGNOSIS — G35 Multiple sclerosis: Secondary | ICD-10-CM | POA: Diagnosis not present

## 2019-09-01 DIAGNOSIS — M1990 Unspecified osteoarthritis, unspecified site: Secondary | ICD-10-CM | POA: Diagnosis not present

## 2019-09-08 DIAGNOSIS — G35 Multiple sclerosis: Secondary | ICD-10-CM | POA: Diagnosis not present

## 2019-09-08 DIAGNOSIS — M069 Rheumatoid arthritis, unspecified: Secondary | ICD-10-CM | POA: Diagnosis not present

## 2019-09-08 DIAGNOSIS — H6121 Impacted cerumen, right ear: Secondary | ICD-10-CM | POA: Diagnosis not present

## 2019-09-08 DIAGNOSIS — H6041 Cholesteatoma of right external ear: Secondary | ICD-10-CM | POA: Diagnosis not present

## 2019-09-10 DIAGNOSIS — H9190 Unspecified hearing loss, unspecified ear: Secondary | ICD-10-CM | POA: Insufficient documentation

## 2019-09-10 DIAGNOSIS — H6041 Cholesteatoma of right external ear: Secondary | ICD-10-CM | POA: Insufficient documentation

## 2019-09-15 DIAGNOSIS — M810 Age-related osteoporosis without current pathological fracture: Secondary | ICD-10-CM | POA: Diagnosis not present

## 2019-09-15 DIAGNOSIS — M79643 Pain in unspecified hand: Secondary | ICD-10-CM | POA: Diagnosis not present

## 2019-09-15 DIAGNOSIS — G35 Multiple sclerosis: Secondary | ICD-10-CM | POA: Diagnosis not present

## 2019-09-15 DIAGNOSIS — M7989 Other specified soft tissue disorders: Secondary | ICD-10-CM | POA: Diagnosis not present

## 2019-09-15 DIAGNOSIS — D472 Monoclonal gammopathy: Secondary | ICD-10-CM | POA: Diagnosis not present

## 2019-09-15 DIAGNOSIS — I509 Heart failure, unspecified: Secondary | ICD-10-CM | POA: Diagnosis not present

## 2019-09-15 DIAGNOSIS — Z23 Encounter for immunization: Secondary | ICD-10-CM | POA: Diagnosis not present

## 2019-09-15 DIAGNOSIS — M0579 Rheumatoid arthritis with rheumatoid factor of multiple sites without organ or systems involvement: Secondary | ICD-10-CM | POA: Diagnosis not present

## 2019-09-15 DIAGNOSIS — Z79899 Other long term (current) drug therapy: Secondary | ICD-10-CM | POA: Diagnosis not present

## 2019-09-15 DIAGNOSIS — I878 Other specified disorders of veins: Secondary | ICD-10-CM | POA: Diagnosis not present

## 2019-09-26 DIAGNOSIS — H353132 Nonexudative age-related macular degeneration, bilateral, intermediate dry stage: Secondary | ICD-10-CM | POA: Diagnosis not present

## 2019-10-04 DIAGNOSIS — H6041 Cholesteatoma of right external ear: Secondary | ICD-10-CM | POA: Diagnosis not present

## 2019-10-08 ENCOUNTER — Encounter: Payer: Self-pay | Admitting: Podiatry

## 2019-10-08 ENCOUNTER — Other Ambulatory Visit: Payer: Self-pay

## 2019-10-08 ENCOUNTER — Ambulatory Visit (INDEPENDENT_AMBULATORY_CARE_PROVIDER_SITE_OTHER): Payer: Medicare Other | Admitting: Podiatry

## 2019-10-08 DIAGNOSIS — L84 Corns and callosities: Secondary | ICD-10-CM | POA: Diagnosis not present

## 2019-10-08 DIAGNOSIS — M79675 Pain in left toe(s): Secondary | ICD-10-CM

## 2019-10-08 DIAGNOSIS — L97511 Non-pressure chronic ulcer of other part of right foot limited to breakdown of skin: Secondary | ICD-10-CM

## 2019-10-08 DIAGNOSIS — G35 Multiple sclerosis: Secondary | ICD-10-CM

## 2019-10-08 DIAGNOSIS — M79674 Pain in right toe(s): Secondary | ICD-10-CM | POA: Diagnosis not present

## 2019-10-08 DIAGNOSIS — B351 Tinea unguium: Secondary | ICD-10-CM

## 2019-10-09 NOTE — Progress Notes (Signed)
Subjective: [name] presents with h/o multiple sclerosis to clinic with cc of painful corns and mycotic toenails b/l. Pain is aggravated when weightbearing with and without shoe gear.  This pain limits her daily activities. Pain symptoms resolve with periodic professional debridement.  Current Outpatient Medications on File Prior to Visit  Medication Sig Dispense Refill  . azelastine (ASTELIN) 137 MCG/SPRAY nasal spray Place 1 spray into the nose 2 (two) times daily as needed for allergies. Use in each nostril as directed     . Biotin 1 MG CAPS Take 1 capsule by mouth daily.     . Calcium Carbonate-Vitamin D (CALCIUM + D PO) Take 1 tablet by mouth 2 (two) times daily.     . folic acid (FOLVITE) 1 MG tablet Take by mouth.    . levothyroxine (SYNTHROID, LEVOTHROID) 25 MCG tablet Take 25 mcg by mouth daily.    . Magnesium 500 MG TABS Take 0.5 tablets by mouth every evening.     . methotrexate 2.5 MG tablet     . Multiple Vitamins-Minerals (ICAPS AREDS FORMULA PO) Take by mouth.    . nystatin ointment (MYCOSTATIN) Apply to rectal area thinly twice daily for 7 days (Patient taking differently: Apply 1 application topically as needed. Apply to rectal area thinly twice daily for 7 days) 30 g 1  . predniSONE (DELTASONE) 5 MG tablet Pt takes 7.5 mg for 2 weeks, then will go to 5 mg    . triamcinolone cream (KENALOG) 0.1 % Apply topically as needed. 30 g 4  . Vitamin D, Ergocalciferol, (DRISDOL) 50000 UNITS CAPS capsule Take 1 capsule (50,000 Units total) by mouth every 7 (seven) days. 30 capsule 3  . vitamin E 100 UNIT capsule Take 100 Units by mouth daily.     No current facility-administered medications on file prior to visit.      Allergies  Allergen Reactions  . Sulfasalazine Anaphylaxis  . Aspirin Nausea Only  . Penicillins Diarrhea  . Sulfa Antibiotics      Objective: There were no vitals filed for this visit.  Physical Examination:  Vascular  Examination: Capillary  refill time immediate x 10 digits.  Palpable DP/PT pulses b/l.  Digital hair present b/l.  No edema noted b/l.  Skin temperature gradient WNL b/l.  Dermatological Examination: Skin with normal turgor, texture and tone b/l.  No open wounds b/l.  No interdigital macerations noted b/l.  Elongated, thick, discolored brittle toenails with subungual debris and pain on dorsal palpation of nailbeds 1-5 b/l.  Hyperkeratotic lesion submet head 5 left foot, distal tip left 2nd and left 3rd digit with tenderness to palpation. No edema, no erythema, no drainage, no flocculence.   Hyperkeratotic lesion distal tip right 3rd digit with subdermal hemorrhage. Measures 0.5 cm in diameter. There is underlying partial thickness ulceration. No erythema, no edema, no drainage, no flocculence. Postdebridement, measurements are 0.5 cm in diameter.   Musculoskeletal Examination: Muscle strength 5/5 to all muscle groups b/l.  Rigid hammertoes lesser digits b/l.   No pain, crepitus or joint discomfort with active/passive ROM.  Neurological Examination: Sensation intact 5/5 b/l with 10 gram monofilament.  Assessment: 1. Mycotic nail infection with pain 1-5 b/l 2. Corns and callosities 3. Partial thickness ulcer right 3rd digit, noninfected 4. Multiple sclerosis  Plan: 1. Toenails 1-5 b/l were debrided in length and girth without iatrogenic laceration.  2. Calluses pared submet head 5 left foot utilizing sterile scalpel blade without incident.Corns distal tip left 2nd and left 3rd  digit  pared utilizing sterile scalpel blade without incident. 3. Partial thickness ulcer debrided right 3rd digit. Cleansed with wound cleanser. Antibiotic ointment and dressing applied. Patient instructed to apply antibiotic ointment to digit once daily. Call if condition worsens.  4. Continue soft, supportive shoe gear daily. 5. Report any pedal injuries to medical professional. 6. Follow up 1 month. 7. Patient/POA to  call should there be a question/concern in there interim.

## 2019-10-26 DIAGNOSIS — H353132 Nonexudative age-related macular degeneration, bilateral, intermediate dry stage: Secondary | ICD-10-CM | POA: Diagnosis not present

## 2019-11-02 ENCOUNTER — Other Ambulatory Visit: Payer: Self-pay

## 2019-11-02 ENCOUNTER — Ambulatory Visit (INDEPENDENT_AMBULATORY_CARE_PROVIDER_SITE_OTHER): Payer: Medicare Other | Admitting: Podiatry

## 2019-11-02 ENCOUNTER — Encounter: Payer: Self-pay | Admitting: Podiatry

## 2019-11-02 DIAGNOSIS — L97511 Non-pressure chronic ulcer of other part of right foot limited to breakdown of skin: Secondary | ICD-10-CM | POA: Diagnosis not present

## 2019-11-02 DIAGNOSIS — L97521 Non-pressure chronic ulcer of other part of left foot limited to breakdown of skin: Secondary | ICD-10-CM

## 2019-11-06 NOTE — Progress Notes (Signed)
Subjective:   [name] presents for continued care of ulceration of distal left 2nd toe and distal 3rd toe right foot.  Patient has been performing daily dressing changes to both feet daily.  Pt. denies any new complaints.  Patient denies any fever, chills, nightsweats, nausea or vomiting.  Current Outpatient Medications on File Prior to Visit  Medication Sig Dispense Refill  . azelastine (ASTELIN) 137 MCG/SPRAY nasal spray Place 1 spray into the nose 2 (two) times daily as needed for allergies. Use in each nostril as directed     . Biotin 1 MG CAPS Take 1 capsule by mouth daily.     . Calcium Carbonate-Vitamin D (CALCIUM + D PO) Take 1 tablet by mouth 2 (two) times daily.     . folic acid (FOLVITE) 1 MG tablet Take by mouth.    . levothyroxine (SYNTHROID, LEVOTHROID) 25 MCG tablet Take 25 mcg by mouth daily.    . Magnesium 500 MG TABS Take 0.5 tablets by mouth every evening.     . methotrexate 2.5 MG tablet     . Multiple Vitamins-Minerals (ICAPS AREDS FORMULA PO) Take by mouth.    . neomycin-polymyxin-hydrocortisone (CORTISPORIN) 3.5-10000-1 OTIC suspension     . nystatin ointment (MYCOSTATIN) Apply to rectal area thinly twice daily for 7 days (Patient taking differently: Apply 1 application topically as needed. Apply to rectal area thinly twice daily for 7 days) 30 g 1  . predniSONE (DELTASONE) 5 MG tablet Pt takes 7.5 mg for 2 weeks, then will go to 5 mg    . triamcinolone cream (KENALOG) 0.1 % Apply topically as needed. 30 g 4  . Vitamin D, Ergocalciferol, (DRISDOL) 50000 UNITS CAPS capsule Take 1 capsule (50,000 Units total) by mouth every 7 (seven) days. 30 capsule 3  . vitamin E 100 UNIT capsule Take 100 Units by mouth daily.     No current facility-administered medications on file prior to visit.      Allergies  Allergen Reactions  . Sulfasalazine Anaphylaxis  . Aspirin Nausea Only  . Penicillins Diarrhea  . Sulfa Antibiotics     Objective:   Vascular  Examination: There were no vitals filed for this visit.  Capillary refill time immediate x 10 digits.  Dorsalis pedis pulses palpable b/l.  Posterior tibial pulses palpable b/l.  Digital hair present b/l.   Skin temperature gradient WNL b/l.  Dermatological Examination: Skin with normal turgor, texture and tone b/l.   Toenails 1-5 b/l recently debrided.   Ulceration located distal tip right 3rd digit and left 2nd digit: Predebridement measurements carried out today of 0.5 cm each in diameter.  No periulcerative erythema, no edema, no drainage.  Flocculence absent b/l.  Malodor absent b/l.  Postdebridement measurements today are: 0.5 cm in diameter with partial thickness and subdermal hemorrhage.  No tracking, no tunneling, no undermining. No probing to bone, no purulent drainage.  No deep abscess evident.  Musculoskeletal: Muscle strength 5/5 to all LE muscle groups bilaterally.  Rigid hammertoes lesser digits b/l.   Neurological: Sensation intact bilaterally with 10 gram monofilament.  Assessment:   1.  Partial thickness Ulceration right 3rd digit 2. Partial thickness ulceration left 2nd digit 3. Multiple sclerosis  Plan: 1. Ulcers  debrided to the level of healthy, viable tissue. Ulcer was cleansed with wound cleanser. Antibiotic Ointment and band-aids applied. 2. Continue current treatment plan. 3. Call office immediately if any signs or symptoms of infection arise.  4. Patient is to follow up  1 month. 5. Patient instructed to report to emergency department with worsening appearance of ulcer/toe/foot, increased pain, foul odor, increased redness, swelling, drainage, fever, chills, nightsweats, nausea, vomiting, increased blood sugar.  6. Patient/POA related understanding.

## 2019-11-09 DIAGNOSIS — H6041 Cholesteatoma of right external ear: Secondary | ICD-10-CM | POA: Diagnosis not present

## 2019-11-17 DIAGNOSIS — G35 Multiple sclerosis: Secondary | ICD-10-CM | POA: Diagnosis not present

## 2019-11-17 DIAGNOSIS — D472 Monoclonal gammopathy: Secondary | ICD-10-CM | POA: Diagnosis not present

## 2019-11-17 DIAGNOSIS — I509 Heart failure, unspecified: Secondary | ICD-10-CM | POA: Diagnosis not present

## 2019-11-17 DIAGNOSIS — M7989 Other specified soft tissue disorders: Secondary | ICD-10-CM | POA: Diagnosis not present

## 2019-11-17 DIAGNOSIS — I878 Other specified disorders of veins: Secondary | ICD-10-CM | POA: Diagnosis not present

## 2019-11-17 DIAGNOSIS — Z79899 Other long term (current) drug therapy: Secondary | ICD-10-CM | POA: Diagnosis not present

## 2019-11-17 DIAGNOSIS — M0579 Rheumatoid arthritis with rheumatoid factor of multiple sites without organ or systems involvement: Secondary | ICD-10-CM | POA: Diagnosis not present

## 2019-11-17 DIAGNOSIS — M810 Age-related osteoporosis without current pathological fracture: Secondary | ICD-10-CM | POA: Diagnosis not present

## 2019-11-17 DIAGNOSIS — M79643 Pain in unspecified hand: Secondary | ICD-10-CM | POA: Diagnosis not present

## 2019-11-19 DIAGNOSIS — H6041 Cholesteatoma of right external ear: Secondary | ICD-10-CM | POA: Diagnosis not present

## 2019-12-01 ENCOUNTER — Other Ambulatory Visit: Payer: Self-pay

## 2019-12-01 ENCOUNTER — Ambulatory Visit (INDEPENDENT_AMBULATORY_CARE_PROVIDER_SITE_OTHER): Payer: Medicare Other | Admitting: Podiatry

## 2019-12-01 ENCOUNTER — Encounter: Payer: Self-pay | Admitting: Podiatry

## 2019-12-01 DIAGNOSIS — L97511 Non-pressure chronic ulcer of other part of right foot limited to breakdown of skin: Secondary | ICD-10-CM

## 2019-12-01 DIAGNOSIS — L97521 Non-pressure chronic ulcer of other part of left foot limited to breakdown of skin: Secondary | ICD-10-CM

## 2019-12-01 NOTE — Progress Notes (Signed)
Subjective:   [name] presents for continued care of ulceration right 3rd, left 2nd digits.  Patient has been performing daily dressing changes to both digits daily utilizing Mupirocin Ointment. Pt. denies any new complaints.  Patient denies any fever, chills, nightsweats, nausea or vomiting.  Current Outpatient Medications on File Prior to Visit  Medication Sig Dispense Refill  . azelastine (ASTELIN) 137 MCG/SPRAY nasal spray Place 1 spray into the nose 2 (two) times daily as needed for allergies. Use in each nostril as directed     . Biotin 1 MG CAPS Take 1 capsule by mouth daily.     . Calcium Carbonate-Vitamin D (CALCIUM + D PO) Take 1 tablet by mouth 2 (two) times daily.     . folic acid (FOLVITE) 1 MG tablet Take by mouth.    . levothyroxine (SYNTHROID, LEVOTHROID) 25 MCG tablet Take 25 mcg by mouth daily.    . Magnesium 500 MG TABS Take 0.5 tablets by mouth every evening.     . magnesium gluconate (MAGONATE) 500 MG tablet Take by mouth.    . methotrexate 2.5 MG tablet     . Multiple Vitamins-Minerals (ICAPS AREDS FORMULA PO) Take by mouth.    . neomycin-polymyxin-hydrocortisone (CORTISPORIN) 3.5-10000-1 OTIC suspension     . nystatin ointment (MYCOSTATIN) Apply to rectal area thinly twice daily for 7 days (Patient taking differently: Apply 1 application topically as needed. Apply to rectal area thinly twice daily for 7 days) 30 g 1  . predniSONE (DELTASONE) 5 MG tablet Pt takes 7.5 mg for 2 weeks, then will go to 5 mg    . triamcinolone cream (KENALOG) 0.1 % Apply topically as needed. 30 g 4  . Vitamin D, Ergocalciferol, (DRISDOL) 50000 UNITS CAPS capsule Take 1 capsule (50,000 Units total) by mouth every 7 (seven) days. 30 capsule 3  . vitamin E 100 UNIT capsule Take 100 Units by mouth daily.     No current facility-administered medications on file prior to visit.     Allergies  Allergen Reactions  . Sulfasalazine Anaphylaxis  . Aspirin Nausea Only  .  Penicillins Diarrhea  . Sulfa Antibiotics    Objective:   Vascular Examination: There were no vitals filed for this visit.  Capillary refill time immediate b/l.  Dorsalis pedis and posterior tibial pulses palpable b/l.  Digital hair present b/l.   Skin temperature gradient WNL b/l.  Dermatological Examination: Skin with normal turgor, texture and tone b/l.  Toenails 1-5 recently debrided b/l.  Ulcerations located distal tip right 2nd and left 3rd digits: Predebridement measurements carried out today of 0.5 cm in diameter with subdermal hemorrhage. No periulcerative erythema, no edema, no drainage.  No flocculence nor malodor.  Postdebridement measurements today are: 0.5 cm in diameter and are partial thickness.  No tracking, no tunneling, no undermining. No probing to bone, no purulent drainage.  No deep abscess evident.  Musculoskeletal: Muscle strength 5/5 to all LE muscle groups bilaterally.  Neurological: Sensation intact bilaterally with 10 gram monofilament.  Assessment:   1.  Partial thickness ulceration right 2nd, left 3rd digits  Plan: 1. Ulcers debrided to the level of healthy, viable tissue. Ulcers cleansed with wound cleanser. Triple antibiotic ointment was applied to base of wound with light dressing. 2. Patient was given instructions on offloading and dressing change/aftercare and was instructed to call immediately if any signs or symptoms of infection arise.  3. Patient is to follow up 4 weeks. 4. Patient instructed to report to  emergency department with worsening appearance of ulcer/toe/foot, increased pain, foul odor, increased redness, swelling, drainage, fever, chills, nightsweats, nausea, vomiting, increased blood sugar.  5. Patient to call should there be a concern in the interim.

## 2019-12-01 NOTE — Patient Instructions (Signed)

## 2019-12-15 DIAGNOSIS — H7200 Central perforation of tympanic membrane, unspecified ear: Secondary | ICD-10-CM | POA: Insufficient documentation

## 2019-12-25 DIAGNOSIS — H353132 Nonexudative age-related macular degeneration, bilateral, intermediate dry stage: Secondary | ICD-10-CM | POA: Diagnosis not present

## 2019-12-26 NOTE — Progress Notes (Signed)
PATIENT: [name] DOB: September 07, 1932  REASON FOR VISIT: follow up HISTORY FROM: patient  Chief Complaint  Patient presents with  . Follow-up    RM2. Alone. Doing well. No questions. States that she is concerned about 3 meds that she takes makes her hair fallout.     HISTORY OF PRESENT ILLNESS: Today 12/27/19 [name] is a 86 y.o. female here today for follow up for MS, most likely primary progressive. She feels that MS is stable. She was diagnosed with RA about a year ago. She is seeing rheumatology monthly. She continues methotrexate and prednisone. She has recently started [place] as well. She is seeing PCP regularly. She continues to have peripheral edema. She is not on diuretics. She was wearing compression stockings but hasn't recently. She is not bothered by fluid. She has not been seen by cardiology. She does not feel this is needed. No chest pain or trouble breathing. She does have intermittent dizziness and feels that she is not as strong as she used to be. No falls. She uses her Rolator. She tries to be active. Her two children are in [place] and [place]. She lives at home. She is able to perform ADL's independently. She has had her home remodeled and is now handicap accessible. She has a great support system with her friends and neighbors.  No vision changes or new weakness.  No new numbness noted.  She does continue to have the urge incontinence but feels that this is unchanged.  She is not interested in starting new medications at this time.   HISTORY: (copied from Dr Garth Bigness note on 12/23/2018)  [name] is an 86 year old woman who probably has primary progressive multiple sclerosis.   Update 12/23/2018: She feels she has been stable over the last year.    She likely has Primary Progressive MS but has not wanted to go on Ocrevus  --- which due to age and possibly higher risk is reasonable.     She has had a lot of swelling/edema and has gained 20  pounds of fluid.   She had an Echo showing grade 2 diastolic dysfunction and elevated ventricular end-diastolic filling pressure.  She also has mild to moderate AR and MR.     Lasix has been started and she feels the swelling is mildly better.   Compression stockings were recommended but the pair she got was too small.     Her gait and balance are mildly worse and she needs unilateral support,.   [facility].   She has a Rollator but getting it in / out of the trunk is difficult so she very rarely uses it.    Bladder is doing worse with urgency and some urge incontinence  Update 12/22/2017:    She notes notes her walking is doing worse.   She uses a cane.  She has a walker but does not use it much (only if going to be on her feet a longer time.    She never got the brace as she can't wear closed toe shoes.   She has no recent fall but stumbles a lot.     She feels she is moving more slowly.   She always feels more steady in the house if she touches the walls or furniture (even if not holding on).  Her left leg does worse than her right.    Bladder is doing worse.  She has both frequency and  hesitancy.   She will sometimes have incontinence in the bathroom but not outside.     She stopped Myrbetriq due to constipation and elevated BP.   However, she takes it if going to be out of the house a while.    Vision is stable.   She had visual changes from temporal arteritis in 2004 and took prednisone x years.   She notes fatigue and gait is worse when more tired.   She has had mild depression but this is stable.    She is very active with her church and [facility].   She sleeps well many nights but other nights wake sup around 230-300 and stays awake.     From 7/122018:  Gait/strength/sensation: She has a long history of difficulties with her gait. The left side is worse than the right side and her legs are much weaker than the arms (only minimal left weakness).    Gait has been  worse with the recent heat.   She feels balance has worsened more this year.    We have tried to get her an AFO but there has been some issues getting in contact with [employer].     Bladder:   She has bladder frequency and hesitancy.  Myrbetriq has helped the frequency but she has some more hesitancy and also notes more constipation.     She has no recent UTI.   She has hesitancy several times at night and has urinary frequency and urgency with incontinence a couple times daily.     Vision:    Her vision was affected by temporal arteritis (2004) and she was on prednisone a year.   She also has macular degeneration.    She has a visual field at home machine and uses it once daily.    Mood:   She has noted mild depression after the death of her husband.     She is active and [facility] most days and church activities 3-4 times a week including choir.   She has had some stress with her husband of 60 years dying 7-8 years ago and her second husband dying more recently.  Fatigue:    She notes her fatigue is much worse with heat, physically and cognitively.  Sometimes she needs a nap due to fatigfue and sleepiness.   She sleeps well most nights.      MS History:   She has had difficulty with her gait for many years. Her left leg seemed clumsy at least 10-15 years ago.   Her left toes would drag at times and her balance was poor.    About 2011, she fell at church and broke her pelvis tripping over a gown.   Her gait continues to worsen.   Her right leg does well.   Her left arm is slightly clumsy and slightly weaker compared to her right.     All of her changes have been gradual.     She denies any numbness.   In 2014, she had MRIs showing many T2/FLAIR hyperintense foci in the brain. At her age,  the pattern was felt to be nonspecific. However, MRI of the cervical and thoracic spine showed adjacent to C2 to the right and a plaque adjacent to T6 to the left  I have reviewed the MRIs of her brain and  spine in 11/11/2013.   The MRI of the cervical spine shows a focus to the right at C2 and another focus to the  left at C6. It also shows multilevel degenerative changes with anterolisthesis of C4 upon C5 but no nerve root compression.  No spinal cord compression.  The MRI of the thoracic spine shows a focus to the left adjacent to T6. It also showed a chronic T9 compression fracture (she was never aware of this).  MRI of the brain shows many T2/FLAIR hyperintense foci.  Foci are non-specific though many of the foci are radially oriented to the ventricles in the periventricular white matter. Some foci in the pons have an appearance more typical for chronic microvascular ischemic change. MRI 2014 showed a chronic compression fracture at T9 and she can not think of no other episode of significant back pain.  Her one paternal first cousin and 2 maternal cousins have MS.    She does not know whether they had PPMS or RRMS.   REVIEW OF SYSTEMS: Out of a complete 14 system review of symptoms, the patient complains only of the following symptoms, dizziness and weakness and all other reviewed systems are negative.  ALLERGIES: Allergies  Allergen Reactions  . Sulfasalazine Anaphylaxis  . Aspirin Nausea Only  . Penicillins Diarrhea  . Sulfa Antibiotics     HOME MEDICATIONS: Outpatient Medications Prior to Visit  Medication Sig Dispense Refill  . Biotin 1 MG CAPS Take 1 capsule by mouth daily.     . Calcium Carbonate-Vitamin D (CALCIUM + D PO) Take 1 tablet by mouth 2 (two) times daily.     . folic acid (FOLVITE) 1 MG tablet Take by mouth.    . levothyroxine (SYNTHROID, LEVOTHROID) 25 MCG tablet Take 25 mcg by mouth daily.    . Magnesium 500 MG TABS Take 0.5 tablets by mouth every evening.     . methotrexate 2.5 MG tablet     . Multiple Vitamins-Minerals (ICAPS AREDS FORMULA PO) Take by mouth.    . predniSONE (DELTASONE) 5 MG tablet Pt takes 7.5 mg for 2 weeks, then will go to 5 mg    . Vitamin D,  Ergocalciferol, (DRISDOL) 50000 UNITS CAPS capsule Take 1 capsule (50,000 Units total) by mouth every 7 (seven) days. 30 capsule 3  . vitamin E 100 UNIT capsule Take 100 Units by mouth daily.    Marland Kitchen azelastine (ASTELIN) 137 MCG/SPRAY nasal spray Place 1 spray into the nose 2 (two) times daily as needed for allergies. Use in each nostril as directed     . magnesium gluconate (MAGONATE) 500 MG tablet Take by mouth.    . neomycin-polymyxin-hydrocortisone (CORTISPORIN) 3.5-10000-1 OTIC suspension     . nystatin ointment (MYCOSTATIN) Apply to rectal area thinly twice daily for 7 days (Patient not taking: Reported on 12/27/2019) 30 g 1  . triamcinolone cream (KENALOG) 0.1 % Apply topically as needed. (Patient not taking: Reported on 12/27/2019) 30 g 4   No facility-administered medications prior to visit.    PAST MEDICAL HISTORY: Past Medical History:  Diagnosis Date  . Chronic venous insufficiency of lower extremity 01/25/2019   With chronic bilateral lower extremity edema  . Incontinent of urine   . Multiple sclerosis ([place]) 2015  . Osteoporosis    osteopenia  . Polyarthritis   . Post-menopausal   . Rheumatoid arthritis ([place])   . Temporal arteritis ([place]) 2011    PAST SURGICAL HISTORY: Past Surgical History:  Procedure Laterality Date  . ABDOMINAL HYSTERECTOMY  1971   TVH  . breast ductectomy Right 05/1995  . COLONOSCOPY  10/2011    FAMILY HISTORY: Family History  Problem  Relation Age of Onset  . Osteoarthritis Father   . Stroke Father   . Heart failure Mother   . Hypertension Mother   . Heart disease Mother   . Multiple sclerosis Cousin   . Multiple sclerosis Cousin   . Multiple sclerosis Cousin     SOCIAL HISTORY: Social History   Socioeconomic History  . Marital status: Widowed    Spouse name: Not on file  . Number of children: 2  . Years of education: Not on file  . Highest education level: Not on file  Occupational History  . Not on file  Tobacco Use  . Smoking  status: Never Smoker  . Smokeless tobacco: Never Used  Substance and Sexual Activity  . Alcohol use: No  . Drug use: No  . Sexual activity: Not Currently    Partners: Male    Birth control/protection: Post-menopausal, Surgical    Comment: hysterectomy  Other Topics Concern  . Not on file  Social History Narrative   Widowed mother of 2, grandmother 66 with 2 great-grandchildren.      Tries to workout at the [facility] several days a week 30 minutes at a time on stairstepper or elliptical.   Social Determinants of Health   Financial Resource Strain:   . Difficulty of Paying Living Expenses: Not on file  Food Insecurity:   . Worried About Charity fundraiser in the Last Year: Not on file  . Ran Out of Food in the Last Year: Not on file  Transportation Needs:   . Lack of Transportation (Medical): Not on file  . Lack of Transportation (Non-Medical): Not on file  Physical Activity:   . Days of Exercise per Week: Not on file  . Minutes of Exercise per Session: Not on file  Stress:   . Feeling of Stress : Not on file  Social Connections:   . Frequency of Communication with Friends and Family: Not on file  . Frequency of Social Gatherings with Friends and Family: Not on file  . Attends Religious Services: Not on file  . Active Member of Clubs or Organizations: Not on file  . Attends Archivist Meetings: Not on file  . Marital Status: Not on file  Intimate Partner Violence:   . Fear of Current or Ex-Partner: Not on file  . Emotionally Abused: Not on file  . Physically Abused: Not on file  . Sexually Abused: Not on file      PHYSICAL EXAM  Vitals:   12/27/19 1103  BP: (!) 148/72  Pulse: (!) 56  Temp: (!) 97.2 F (36.2 C)  Weight: 135 lb (61.2 kg)  Height: 5\' 7"  (1.702 m)   Body mass index is 21.14 kg/m.  Generalized: Well developed, in no acute distress  Cardiology: normal rate and rhythm, no murmur noted Respiratory: clear to auscultation bilaterally    Neurological examination  Mentation: Alert oriented to time, place, history taking. Follows all commands speech and language fluent Cranial nerve II-XII: Pupils were equal round reactive to light. Extraocular movements were full, visual field were full on confrontational test. Facial sensation and strength were normal. Uvula tongue midline. Head turning and shoulder shrug  were normal and symmetric. Motor: The motor testing reveals 4 over 5 strength of bilateral upper extremities. 4-/5 of left lower and 4/5 of right lower. Good symmetric motor tone is noted throughout.  Sensory: Sensory testing is intact to soft touch on all 4 extremities. No evidence of extinction is noted.  Coordination: Cerebellar  testing reveals good finger-nose-finger and heel-to-shin bilaterally.  Gait and station: Gait is short but stable with rolator. Left foot drop noted. Tandem gait not attempted Reflexes: Deep tendon reflexes are symmetric and normal bilaterally.   DIAGNOSTIC DATA (LABS, IMAGING, TESTING) - I reviewed patient records, labs, notes, testing and imaging myself where available.  No flowsheet data found.   Lab Results  Component Value Date   WBC 11.0 (H) 02/23/2019   HGB 13.8 02/23/2019   HCT 43.2 02/23/2019   MCV 90.8 02/23/2019   PLT 260 02/23/2019      Component Value Date/Time   NA 137 02/23/2019 1444   K 4.7 02/23/2019 1444   CL 101 02/23/2019 1444   CO2 25 02/23/2019 1444   GLUCOSE 112 (H) 02/23/2019 1444   BUN 26 (H) 02/23/2019 1444   CREATININE 0.82 02/23/2019 1444   CALCIUM 9.8 02/23/2019 1444   PROT 7.6 02/23/2019 1444   ALBUMIN 4.0 02/23/2019 1444   AST 27 02/23/2019 1444   ALT 27 02/23/2019 1444   ALKPHOS 74 02/23/2019 1444   BILITOT 0.4 02/23/2019 1444   GFRNONAA >60 02/23/2019 1444   GFRAA >60 02/23/2019 1444   No results found for: CHOL, HDL, LDLCALC, LDLDIRECT, TRIG, CHOLHDL No results found for: HGBA1C No results found for: VITAMINB12 No results found for:  TSH     ASSESSMENT AND PLAN 86 y.o. year old female  has a past medical history of Chronic venous insufficiency of lower extremity (01/25/2019), Incontinent of urine, Multiple sclerosis ([place]) (2015), Osteoporosis, Polyarthritis, Post-menopausal, Rheumatoid arthritis ([place]), and Temporal arteritis ([place]) (2011). here with     ICD-10-CM   1. Multiple sclerosis, primary progressive ([place])  G35   2. Urinary dysfunction  R39.198   3. Pedal edema  R60.0     [name] continues to do fairly well.  She feels that her MS symptoms are stable.  She does not feel that an MRI is necessary.  She is not on DMT.  She is not interested in adding any additional medications at this time.  We will continue to monitor urge incontinence.  We have discussed medications that can help but she is not interested at this time.  Fall precautions advised.  She will use her Rollator as directed.  Continue close follow-up with primary care and rheumatology.  She will keep a close eye on peripheral edema.  Compression stockings advised.  She was educated of when to seek emergency medical attention.  Fortunately she has had not had any chest pain or difficulty breathing.  Lungs are clear on exam today.  We will have her follow back up with Dr. Ladonna Snide in 1 year, sooner if needed.  She verbalizes understanding and agreement with this plan.   No orders of the defined types were placed in this encounter.    No orders of the defined types were placed in this encounter.        Debbora Presto, FNP-C 12/27/2019, 1:16 PM [facility] [address], [place], [place] [address] [phone]

## 2019-12-27 ENCOUNTER — Other Ambulatory Visit: Payer: Self-pay

## 2019-12-27 ENCOUNTER — Ambulatory Visit (INDEPENDENT_AMBULATORY_CARE_PROVIDER_SITE_OTHER): Payer: Medicare Other | Admitting: Family Medicine

## 2019-12-27 ENCOUNTER — Encounter: Payer: Self-pay | Admitting: Family Medicine

## 2019-12-27 VITALS — BP 148/72 | HR 56 | Temp 97.2°F | Ht 67.0 in | Wt 135.0 lb

## 2019-12-27 DIAGNOSIS — G35 Multiple sclerosis: Secondary | ICD-10-CM | POA: Diagnosis not present

## 2019-12-27 DIAGNOSIS — R39198 Other difficulties with micturition: Secondary | ICD-10-CM | POA: Diagnosis not present

## 2019-12-27 DIAGNOSIS — R600 Localized edema: Secondary | ICD-10-CM

## 2019-12-27 NOTE — Progress Notes (Signed)
I have read the note, and I agree with the clinical assessment and plan.  Shedric Fredericks A. Luccia Reinheimer, MD, PhD, FAAN Certified in Neurology, Clinical Neurophysiology, Sleep Medicine, Pain Medicine and Neuroimaging  [facility] [address], [address] [place], [place] [address] [phone]  

## 2019-12-27 NOTE — Patient Instructions (Signed)
Continue close follow up with PCP and rheumatology. Please let PCP know if swelling does not improve. Try to use compression stocking.   Follow up with Dr Felecia Shelling in 1 year, sooner if needed    Multiple Sclerosis Multiple sclerosis (MS) is a disease of the brain, spinal cord, and optic nerves (central nervous system). It causes the body's disease-fighting (immune) system to destroy the protective covering (myelin sheath) around nerves in the brain. When this happens, signals (nerve impulses) going to and from the brain and spinal cord do not get sent properly or may not get sent at all. There are several types of MS:  Relapsing-remitting MS. This is the most common type. This causes sudden attacks of symptoms. After an attack, you may recover completely until the next attack, or some symptoms may remain permanently.  Secondary progressive MS. This usually develops after the onset of relapsing-remitting MS. Similar to relapsing-remitting MS, this type also causes sudden attacks of symptoms. Attacks may be less frequent, but symptoms slowly get worse (progress) over time.  Primary progressive MS. This causes symptoms that steadily progress over time. This type of MS does not cause sudden attacks of symptoms. The age of onset of MS varies, but it often develops between 49-53 years of age. MS is a lifelong (chronic) condition. There is no cure, but treatment can help slow down the progression of the disease. What are the causes? The cause of this condition is not known. What increases the risk? You are more likely to develop this condition if:  You are a woman.  You have a relative with MS. However, the condition is not passed from parent to child (inherited).  You have a lack (deficiency) of vitamin D.  You smoke. MS is more common in the [place] than in the [place]. What are the signs or symptoms? Relapsing-remitting and secondary progressive MS cause symptoms  to occur in episodes or attacks that may last weeks to months. There may be long periods between attacks in which there are almost no symptoms. Primary progressive MS causes symptoms to steadily progress after they develop. Symptoms of MS vary because of the many different ways it affects the central nervous system. The main symptoms include:  Vision problems and eye pain.  Numbness.  Weakness.  Inability to move your arms, hands, feet, or legs (paralysis).  Balance problems.  Shaking that you cannot control (tremors).  Muscle spasms.  Problems with thinking (cognitive changes). MS can also cause symptoms that are associated with the disease, but are not always the direct result of an MS attack. They may include:  Inability to control urination or bowel movements (incontinence).  Headaches.  Fatigue.  Inability to tolerate heat.  Emotional changes.  Depression.  Pain. How is this diagnosed? This condition is diagnosed based on:  Your symptoms.  A neurological exam. This involves checking central nervous system function, such as nerve function, reflexes, and coordination.  MRIs of the brain and spinal cord.  Lab tests, including a lumbar puncture that tests the fluid that surrounds the brain and spinal cord (cerebrospinal fluid).  Tests to measure the electrical activity of the brain in response to stimulation (evoked potentials). How is this treated? There is no cure for MS, but medicines can help decrease the number and frequency of attacks and help relieve nuisance symptoms. Treatment options may include:  Medicines that reduce the frequency of attacks. These medicines may be given by injection, by mouth (orally), or through  an IV.  Medicines that reduce inflammation (steroids). These may provide short-term relief of symptoms.  Medicines to help control pain, depression, fatigue, or incontinence.  Vitamin D, if you have a deficiency.  Using devices to help  you move around (assistive devices), such as braces, a cane, or a walker.  Physical therapy to strengthen and stretch your muscles.  Occupational therapy to help you with everyday tasks.  Alternative or complementary treatments such as exercise, massage, or acupuncture. Follow these instructions at home:  Take over-the-counter and prescription medicines only as told by your health care provider.  Do not drive or use heavy machinery while taking prescription pain medicine.  Use assistive devices as recommended by your physical therapist or your health care provider.  Exercise as directed by your health care provider.  Return to your normal activities as told by your health care provider. Ask your health care provider what activities are safe for you.  Reach out for support. Share your feelings with friends, family, or a support group.  Keep all follow-up visits as told by your health care provider and therapists. This is important. Where to find more information  National Multiple Sclerosis Society: https://www.nationalmssociety.org Contact a health care provider if:  You feel depressed.  You develop new pain or numbness.  You have tremors.  You have problems with sexual function. Get help right away if:  You develop paralysis.  You develop numbness.  You have problems with your bladder or bowel function.  You develop double vision.  You lose vision in one or both eyes.  You develop suicidal thoughts.  You develop severe confusion. If you ever feel like you may hurt yourself or others, or have thoughts about taking your own life, get help right away. You can go to your nearest emergency department or call:  Your local emergency services (911 in the [place]  A suicide crisis helpline, such as the [place] at 828-879-2508. This is open 24 hours a day. Summary  Multiple sclerosis (MS) is a disease of the central nervous system that  causes the body's immune system to destroy the protective covering (myelin sheath) around nerves in the brain.  There are 3 types of MS: relapsing-remitting, secondary progressive, and primary progressive. Relapsing-remitting and secondary progressive MS cause symptoms to occur in episodes or attacks that may last weeks to months. Primary progressive MS causes symptoms to steadily progress after they develop.  There is no cure for MS, but medicines can help decrease the number and frequency of attacks and help relieve nuisance symptoms. Treatment may also include physical or occupational therapy.  If you develop numbness, paralysis, vision problems, or other neurological symptoms, get help right away. This information is not intended to replace advice given to you by your health care provider. Make sure you discuss any questions you have with your health care provider. Document Revised: 11/07/2017 Document Reviewed: 02/03/2017 Elsevier Patient Education  2020 [employer].    Peripheral Edema  Peripheral edema is swelling that is caused by a buildup of fluid. Peripheral edema most often affects the lower legs, ankles, and feet. It can also develop in the arms, hands, and face. The area of the body that has peripheral edema will look swollen. It may also feel heavy or warm. Your clothes may start to feel tight. Pressing on the area may make a temporary dent in your skin. You may not be able to move your swollen arm or leg as much as usual. There  are many causes of peripheral edema. It can happen because of a complication of other conditions such as congestive heart failure, kidney disease, or a problem with your blood circulation. It also can be a side effect of certain medicines or because of an infection. It often happens to women during pregnancy. Sometimes, the cause is not known. Follow these instructions at home: Managing pain, stiffness, and swelling   Raise (elevate) your legs while you  are sitting or lying down.  Move around often to prevent stiffness and to lessen swelling.  Do not sit or stand for long periods of time.  Wear support stockings as told by your health care provider. Medicines  Take over-the-counter and prescription medicines only as told by your health care provider.  Your health care provider may prescribe medicine to help your body get rid of excess water (diuretic). General instructions  Pay attention to any changes in your symptoms.  Follow instructions from your health care provider about limiting salt (sodium) in your diet. Sometimes, eating less salt may reduce swelling.  Moisturize skin daily to help prevent skin from cracking and draining.  Keep all follow-up visits as told by your health care provider. This is important. Contact a health care provider if you have:  A fever.  Edema that starts suddenly or is getting worse, especially if you are pregnant or have a medical condition.  Swelling in only one leg.  Increased swelling, redness, or pain in one or both of your legs.  Drainage or sores at the area where you have edema. Get help right away if you:  Develop shortness of breath, especially when you are lying down.  Have pain in your chest or abdomen.  Feel weak.  Feel faint. Summary  Peripheral edema is swelling that is caused by a buildup of fluid. Peripheral edema most often affects the lower legs, ankles, and feet.  Move around often to prevent stiffness and to lessen swelling. Do not sit or stand for long periods of time.  Pay attention to any changes in your symptoms.  Contact a health care provider if you have edema that starts suddenly or is getting worse, especially if you are pregnant or have a medical condition.  Get help right away if you develop shortness of breath, especially when lying down. This information is not intended to replace advice given to you by your health care provider. Make sure you  discuss any questions you have with your health care provider. Document Revised: 08/19/2018 Document Reviewed: 08/19/2018 Elsevier Patient Education  [employer] [place].

## 2019-12-29 ENCOUNTER — Ambulatory Visit (INDEPENDENT_AMBULATORY_CARE_PROVIDER_SITE_OTHER): Payer: Medicare Other | Admitting: Podiatry

## 2019-12-29 ENCOUNTER — Other Ambulatory Visit: Payer: Self-pay

## 2019-12-29 ENCOUNTER — Encounter: Payer: Self-pay | Admitting: Podiatry

## 2019-12-29 DIAGNOSIS — L97521 Non-pressure chronic ulcer of other part of left foot limited to breakdown of skin: Secondary | ICD-10-CM

## 2019-12-29 DIAGNOSIS — L97511 Non-pressure chronic ulcer of other part of right foot limited to breakdown of skin: Secondary | ICD-10-CM

## 2019-12-29 DIAGNOSIS — M79674 Pain in right toe(s): Secondary | ICD-10-CM | POA: Diagnosis not present

## 2019-12-29 DIAGNOSIS — M79675 Pain in left toe(s): Secondary | ICD-10-CM

## 2019-12-29 DIAGNOSIS — G35 Multiple sclerosis: Secondary | ICD-10-CM

## 2019-12-29 DIAGNOSIS — B351 Tinea unguium: Secondary | ICD-10-CM

## 2019-12-29 NOTE — Patient Instructions (Signed)

## 2020-01-02 NOTE — Progress Notes (Signed)
Subjective: [name] presents today for follow up of partial thickness ulcerations right 3rd/left 2nd digits, painful mycotic nails b/l that are difficult to trim. Pain interferes with ambulation. Aggravating factors include wearing enclosed shoe gear. Pain is relieved with periodic professional debridement.   She states she feels her toes are better as her son purchased her a new walker which makes her feet feel more stable. She doesn't feel as if her toes are clawing as much when she walks.  Allergies  Allergen Reactions  . Sulfasalazine Anaphylaxis  . Aspirin Nausea Only  . Penicillins Diarrhea  . Sulfa Antibiotics     Objective: There were no vitals filed for this visit.  Vascular Examination:  capillary refill time to digits immediate b/l, palpable DP pulses b/l, palpable PT pulses b/l, pedal hair present b/l and skin temperature gradient within normal limits b/l  Dermatological Examination: Pedal skin with normal turgor, texture and tone bilaterally, no open wounds bilaterally, no interdigital macerations bilaterally, toenails 1-5 b/l elongated, dystrophic, thickened, crumbly with subungual debris and Ulceration located distal tip right 3rd digit and distal tip left 2nd digit. Predebridement measurements carried out today of 0.3 cm in diameter with subdermal hemorrhage right 3rd digit and 0. 2 cm in diameter left 2nd digit, both with tenderness to palpation.  No periulcerative erythema, no edema, no drainage.  No flocculence, no malodor. Postdebridement measurements are 0.4 x 0.4 x 0.1 cm, partial thickness right 3rd digit and 0.3 x 0.2 x 0.1 cm, partial thickness left 2nd digit  Musculoskeletal: no pain crepitus or joint limitation noted with ROM b/l, spasticity b/l LE and utilizes rollator for ambulation assistance  Neurological: sensation intact 5/5 intact bilaterally with 10g monofilament  Assessment: No diagnosis found.   Plan: -Toenails 1-5 b/l were debrided  in length and girth without iatrogenic bleeding. -Ulcers were debrided of reactive hyperkeratoses and necrotic tissue was resected to the level of bleeding or viable tissue. Ulcer was cleansed with wound cleanser. Triple antibiotic ointment was applied to base of wound with light dressing. Continue daily dressing with antibiotic ointment and band-aid. Also continue digital pads daily. -Patient has instructions on offloading and dressing change/aftercare and was instructed to call immediately if any signs or symptoms of infection arise.   -Patient related understanding. -Patient to continue soft, supportive shoe gear daily. -Patient to report any pedal injuries to medical professional immediately. -Patient to call should there be question/concern in the interim. -She would like to extend her visits out and try 9 week follow up for her next visit, with understanding if she has any problems to call our office. Return in 9 weeks (on 03/01/2020) for nail and callus trim.

## 2020-01-08 ENCOUNTER — Ambulatory Visit: Payer: Medicare Other

## 2020-01-10 ENCOUNTER — Ambulatory Visit: Payer: Medicare Other

## 2020-01-11 DIAGNOSIS — H353132 Nonexudative age-related macular degeneration, bilateral, intermediate dry stage: Secondary | ICD-10-CM | POA: Diagnosis not present

## 2020-01-11 DIAGNOSIS — H35371 Puckering of macula, right eye: Secondary | ICD-10-CM | POA: Diagnosis not present

## 2020-01-11 DIAGNOSIS — H43813 Vitreous degeneration, bilateral: Secondary | ICD-10-CM | POA: Diagnosis not present

## 2020-01-14 ENCOUNTER — Ambulatory Visit: Payer: Medicare Other

## 2020-01-16 ENCOUNTER — Ambulatory Visit: Payer: Medicare Other | Attending: Internal Medicine

## 2020-01-16 DIAGNOSIS — Z23 Encounter for immunization: Secondary | ICD-10-CM

## 2020-01-19 DIAGNOSIS — H7201 Central perforation of tympanic membrane, right ear: Secondary | ICD-10-CM | POA: Diagnosis not present

## 2020-01-19 DIAGNOSIS — H6041 Cholesteatoma of right external ear: Secondary | ICD-10-CM | POA: Diagnosis not present

## 2020-02-02 ENCOUNTER — Telehealth: Payer: Self-pay | Admitting: *Deleted

## 2020-02-02 NOTE — Telephone Encounter (Signed)
Pt states she has a large blood blister on her foot and wanted to know what to do.

## 2020-02-02 NOTE — Telephone Encounter (Signed)
Pt called states she did not get a call, but was called and told to make an appt. Message sent to scheduler to contact pt for an appt.

## 2020-02-02 NOTE — Telephone Encounter (Signed)
Left message instruction pt that our office was following all COVID precautions and she should make an appt to be seen so the blood blister would not develop into a open wound, appt line [phone].

## 2020-02-04 ENCOUNTER — Telehealth: Payer: Self-pay | Admitting: *Deleted

## 2020-02-04 NOTE — Telephone Encounter (Signed)
Pt states the blood blister has opened and she would like to know how to treat it

## 2020-02-04 NOTE — Telephone Encounter (Signed)
I spoke with pt and she states she is using a pad and neosporin and she wanted to know if that was okay or if she could soak in epsom salt. I told pt to continue with her current dressing and no soaking or standing water at this time and keep changing the dressing when it is soaked. I told pt that if she should have increased redness, swelling or streaks from the site or fever go to the ED and pt stated understanding.

## 2020-02-10 ENCOUNTER — Ambulatory Visit: Payer: Medicare Other | Attending: Internal Medicine

## 2020-02-10 DIAGNOSIS — Z23 Encounter for immunization: Secondary | ICD-10-CM | POA: Insufficient documentation

## 2020-02-10 NOTE — Progress Notes (Signed)
   Covid-19 Vaccination Clinic  Name:  [name]    MRN: [id number] DOB: 12-05-1932  02/10/2020  [name] was observed post Covid-19 immunization for 15 minutes without incident. She was provided with Vaccine Information Sheet and instruction to access the V-Safe system.   [name] was instructed to call 911 with any severe reactions post vaccine: Marland Kitchen Difficulty breathing  . Swelling of face and throat  . A fast heartbeat  . A bad rash all over body  . Dizziness and weakness   Immunizations Administered    Name Date Dose VIS Date Route   Pfizer COVID-19 Vaccine 02/10/2020 12:05 PM 0.3 mL 11/19/2019 Intramuscular   Manufacturer: [employer]   Lot: [id number]   Pablo: [id number]

## 2020-02-11 ENCOUNTER — Encounter: Payer: Self-pay | Admitting: Podiatry

## 2020-02-11 ENCOUNTER — Ambulatory Visit (INDEPENDENT_AMBULATORY_CARE_PROVIDER_SITE_OTHER): Payer: Medicare Other

## 2020-02-11 ENCOUNTER — Other Ambulatory Visit: Payer: Self-pay

## 2020-02-11 ENCOUNTER — Ambulatory Visit (INDEPENDENT_AMBULATORY_CARE_PROVIDER_SITE_OTHER): Payer: Medicare Other | Admitting: Podiatry

## 2020-02-11 DIAGNOSIS — L97521 Non-pressure chronic ulcer of other part of left foot limited to breakdown of skin: Secondary | ICD-10-CM | POA: Diagnosis not present

## 2020-02-11 DIAGNOSIS — M0579 Rheumatoid arthritis with rheumatoid factor of multiple sites without organ or systems involvement: Secondary | ICD-10-CM | POA: Diagnosis not present

## 2020-02-11 DIAGNOSIS — G35 Multiple sclerosis: Secondary | ICD-10-CM | POA: Diagnosis not present

## 2020-02-11 MED ORDER — DOXYCYCLINE HYCLATE 100 MG PO CAPS: 100.0000 mg | ORAL_CAPSULE | Freq: Two times a day (BID) | ORAL | 0 refills | Status: AC

## 2020-02-11 MED ORDER — MUPIROCIN 2 % EX OINT: TOPICAL_OINTMENT | CUTANEOUS | 0 refills | Status: DC

## 2020-02-11 MED ORDER — SALINE 0.9 % NA SOLN: NASAL | 0 refills | Status: DC

## 2020-02-11 NOTE — Patient Instructions (Signed)
DRESSING CHANGES LEFT FOOT:     1. KEEP LEFT  FOOT DRY AT ALL TIMES!!!!  2. CLEANSE ULCER WITH SALINE.  3. DAB DRY WITH GAUZE SPONGE.  4. APPLY A LIGHT AMOUNT OF MUPIROCIN OINTMENT TO BASE OF ULCER.  5. APPLY OUTER DRESSING OR BAND-AID AS INSTRUCTED.  6. DO NOT WALK BAREFOOT!!!  7. IF YOU EXPERIENCE ANY FEVER, CHILLS, NIGHTSWEATS, NAUSEA OR VOMITING, ELEVATED OR LOW BLOOD SUGARS, REPORT TO EMERGENCY ROOM.  8. IF YOU EXPERIENCE INCREASED REDNESS, PAIN, SWELLING, DISCOLORATION, ODOR, PUS, DRAINAGE OR WARMTH OF YOUR FOOT, REPORT TO EMERGENCY ROOM.

## 2020-02-15 DIAGNOSIS — H9113 Presbycusis, bilateral: Secondary | ICD-10-CM | POA: Diagnosis not present

## 2020-02-15 DIAGNOSIS — H6041 Cholesteatoma of right external ear: Secondary | ICD-10-CM | POA: Diagnosis not present

## 2020-02-16 DIAGNOSIS — M79643 Pain in unspecified hand: Secondary | ICD-10-CM | POA: Diagnosis not present

## 2020-02-16 DIAGNOSIS — Z79899 Other long term (current) drug therapy: Secondary | ICD-10-CM | POA: Diagnosis not present

## 2020-02-16 DIAGNOSIS — I509 Heart failure, unspecified: Secondary | ICD-10-CM | POA: Diagnosis not present

## 2020-02-16 DIAGNOSIS — M810 Age-related osteoporosis without current pathological fracture: Secondary | ICD-10-CM | POA: Diagnosis not present

## 2020-02-16 DIAGNOSIS — I878 Other specified disorders of veins: Secondary | ICD-10-CM | POA: Diagnosis not present

## 2020-02-16 DIAGNOSIS — M0579 Rheumatoid arthritis with rheumatoid factor of multiple sites without organ or systems involvement: Secondary | ICD-10-CM | POA: Diagnosis not present

## 2020-02-16 DIAGNOSIS — D472 Monoclonal gammopathy: Secondary | ICD-10-CM | POA: Diagnosis not present

## 2020-02-16 DIAGNOSIS — G35 Multiple sclerosis: Secondary | ICD-10-CM | POA: Diagnosis not present

## 2020-02-16 DIAGNOSIS — L659 Nonscarring hair loss, unspecified: Secondary | ICD-10-CM | POA: Diagnosis not present

## 2020-02-16 DIAGNOSIS — M7989 Other specified soft tissue disorders: Secondary | ICD-10-CM | POA: Diagnosis not present

## 2020-02-17 ENCOUNTER — Encounter: Payer: Self-pay | Admitting: Podiatry

## 2020-02-17 NOTE — Progress Notes (Signed)
Subjective:   [name] presents to clinic today with new concern of development of blister that developed on bottom on left foot. She  Drainage was clear liquid.  She states she has been keeping it clean. Pt states she called office and was not able to get an appt with me. Pt. denies any new complaints.  Patient denies any fever, chills, nightsweats, nausea or vomiting.  Pertinent medical history of multiple sclerosis and RA.  She states she continues to apply antibiotic ointment to her digital preulcerative lesions.  Current Outpatient Medications on File Prior to Visit  Medication Sig Dispense Refill  . azelastine (ASTELIN) 137 MCG/SPRAY nasal spray Place 1 spray into the nose 2 (two) times daily as needed for allergies. Use in each nostril as directed     . Biotin 1 MG CAPS Take 1 capsule by mouth daily.     . Calcium Carbonate-Vitamin D (CALCIUM + D PO) Take 1 tablet by mouth 2 (two) times daily.     . folic acid (FOLVITE) 1 MG tablet Take by mouth.    . levothyroxine (SYNTHROID, LEVOTHROID) 25 MCG tablet Take 25 mcg by mouth daily.    . Magnesium 500 MG TABS Take 0.5 tablets by mouth every evening.     . magnesium gluconate (MAGONATE) 500 MG tablet Take by mouth.    . methotrexate 2.5 MG tablet     . Multiple Vitamins-Minerals (ICAPS AREDS FORMULA PO) Take by mouth.    . neomycin-polymyxin-hydrocortisone (CORTISPORIN) 3.5-10000-1 OTIC suspension     . nystatin ointment (MYCOSTATIN) Apply to rectal area thinly twice daily for 7 days (Patient not taking: Reported on 12/27/2019) 30 g 1  . predniSONE (DELTASONE) 5 MG tablet Pt takes 7.5 mg for 2 weeks, then will go to 5 mg    . triamcinolone cream (KENALOG) 0.1 % Apply topically as needed. (Patient not taking: Reported on 12/27/2019) 30 g 4  . Vitamin D, Ergocalciferol, (DRISDOL) 50000 UNITS CAPS capsule Take 1 capsule (50,000 Units total) by mouth every 7 (seven) days. 30 capsule 3  . vitamin E 100 UNIT capsule Take 100  Units by mouth daily.     No current facility-administered medications on file prior to visit.     Allergies  Allergen Reactions  . Sulfasalazine Anaphylaxis  . Aspirin Nausea Only  . Penicillins Diarrhea  . Sulfa Antibiotics     Objective:   Vascular Examination: There were no vitals filed for this visit.   Capillary refill time to digits immediate b/l. Palpable DP pulses b/l. Palpable PT pulses b/l. Pedal hair present b/l. Skin temperature gradient within normal limits b/l. Unilateral edema left LE. No pain with calf compression LLE.   Pedal skin with normal turgor, texture and tone bilaterally. No interdigital macerations bilaterally. Ulceration located submet head 5 left foot. Predebridement measurements carried out today of 1.0 x 1.0 cm.  No periulcerative erythema, no edema, no drainage.  No flocculence, no malodor. Roof of ulcer is thinly hyperkeratotic. Postdebridement measurements are 1.0 x 1.0 and is partial thickness. cm. Postdebridement, there is no undermining, no tunneling, no visible joint or bone exposure, no probing to bone, no odor. Base of ulcer is noted to be dermal.  Ulceration located submet head 5 left foot.  Predebridement measurements carried out today of 1.0 x 1.0 cm.  No periulcerative erythema, no edema, no drainage.  Flocculence absent.  Malodor absent.  Postdebridement measurements today are: 1.0 x 1.0 to level of dermis.  No tracking,  no tunneling, no undermining. No probing to bone, no purulent drainage.  No deep abscess evident.  Normal muscle strength 5/5 to all lower extremity muscle groups bilaterally, no gross bony deformities bilaterally, no pain crepitus or joint limitation noted with ROM b/l, hammertoes noted to the  1-5 bilaterally and utilizes rollator for ambulation assistance.  Protective sensation intact 5/5 intact bilaterally with 10g monofilament b/l and spasticity noted BLE.  Xray findings left foot: no gas in tissues, soft tissue  swelling present left foot/ankle and no bone erosion noted at location of ulceration  Assessment:   1.  Partial thickness Ulceration left foot 2.  Multiple Sclerosis 3. Rheumatoid Arthritis  Plan: 1. Ulcer left foot was debrided to the level of healthy, viable tissue. Ulcer was cleansed with wound cleanser. Triple antibiotic ointment and light dressing was applied to base of wound with light dressing. 2. Xray was performed/reviewed left foot. 3. I don't feet she would be stable in a [place] shoe due to clawing from her spasticity from her MS.  4. Prescription written for Mupirocin Ointment. Patient is to apply to left foot ulcer once daily and cover with dressing.  5. Order written for Doxycycline 100 mg tabs. Take 1 tablet (100 mg total) by mouth 2 (two) times daily for one week.  6. Patient was given written instructions on dressing changes for left foot and was instructed to call immediately if any signs or symptoms of infection arise.  7. Patient is to follow up one week. 8. Patient instructed to report to emergency department with worsening appearance of ulcer/toe/foot, increased pain, foul odor, increased redness, swelling, drainage, fever, chills, nightsweats, nausea, vomiting, increased blood sugar.  9. Patient/POA related understanding.

## 2020-02-21 DIAGNOSIS — M0579 Rheumatoid arthritis with rheumatoid factor of multiple sites without organ or systems involvement: Secondary | ICD-10-CM | POA: Diagnosis not present

## 2020-02-23 DIAGNOSIS — H353132 Nonexudative age-related macular degeneration, bilateral, intermediate dry stage: Secondary | ICD-10-CM | POA: Diagnosis not present

## 2020-02-29 DIAGNOSIS — M810 Age-related osteoporosis without current pathological fracture: Secondary | ICD-10-CM | POA: Diagnosis not present

## 2020-02-29 DIAGNOSIS — M8589 Other specified disorders of bone density and structure, multiple sites: Secondary | ICD-10-CM | POA: Diagnosis not present

## 2020-03-01 ENCOUNTER — Other Ambulatory Visit: Payer: Self-pay

## 2020-03-01 ENCOUNTER — Ambulatory Visit (INDEPENDENT_AMBULATORY_CARE_PROVIDER_SITE_OTHER): Payer: Medicare Other | Admitting: Podiatry

## 2020-03-01 ENCOUNTER — Encounter: Payer: Self-pay | Admitting: Certified Nurse Midwife

## 2020-03-01 DIAGNOSIS — G35 Multiple sclerosis: Secondary | ICD-10-CM | POA: Diagnosis not present

## 2020-03-01 DIAGNOSIS — R600 Localized edema: Secondary | ICD-10-CM | POA: Diagnosis not present

## 2020-03-01 DIAGNOSIS — L97521 Non-pressure chronic ulcer of other part of left foot limited to breakdown of skin: Secondary | ICD-10-CM

## 2020-03-01 NOTE — Patient Instructions (Signed)

## 2020-03-02 DIAGNOSIS — R609 Edema, unspecified: Secondary | ICD-10-CM | POA: Diagnosis not present

## 2020-03-02 DIAGNOSIS — G35 Multiple sclerosis: Secondary | ICD-10-CM | POA: Diagnosis not present

## 2020-03-02 DIAGNOSIS — E039 Hypothyroidism, unspecified: Secondary | ICD-10-CM | POA: Diagnosis not present

## 2020-03-02 DIAGNOSIS — E559 Vitamin D deficiency, unspecified: Secondary | ICD-10-CM | POA: Diagnosis not present

## 2020-03-02 DIAGNOSIS — R262 Difficulty in walking, not elsewhere classified: Secondary | ICD-10-CM | POA: Diagnosis not present

## 2020-03-02 DIAGNOSIS — M1990 Unspecified osteoarthritis, unspecified site: Secondary | ICD-10-CM | POA: Diagnosis not present

## 2020-03-02 DIAGNOSIS — M810 Age-related osteoporosis without current pathological fracture: Secondary | ICD-10-CM | POA: Diagnosis not present

## 2020-03-02 DIAGNOSIS — E871 Hypo-osmolality and hyponatremia: Secondary | ICD-10-CM | POA: Diagnosis not present

## 2020-03-06 ENCOUNTER — Encounter: Payer: Self-pay | Admitting: Podiatry

## 2020-03-06 NOTE — Progress Notes (Signed)
Subjective:   Ms. Annalese Fante presents for continued care of ulceration submet head 5 left foot.  Patient has been performing daily dressing changes to foot daily utilizing Mupirocin Ointment. Pt. denies any new complaints.  Patient denies any fever, chills, nightsweats, nausea or vomiting.  Current Outpatient Medications on File Prior to Visit  Medication Sig Dispense Refill  . azelastine (ASTELIN) 137 MCG/SPRAY nasal spray Place 1 spray into the nose 2 (two) times daily as needed for allergies. Use in each nostril as directed     . Biotin 1 MG CAPS Take 1 capsule by mouth daily.     . Calcium Carbonate-Vitamin D (CALCIUM + D PO) Take 1 tablet by mouth 2 (two) times daily.     . folic acid (FOLVITE) 1 MG tablet Take by mouth.    . levothyroxine (SYNTHROID, LEVOTHROID) 25 MCG tablet Take 25 mcg by mouth daily.    . Magnesium 500 MG TABS Take 0.5 tablets by mouth every evening.     . magnesium gluconate (MAGONATE) 500 MG tablet Take by mouth.    . methotrexate 2.5 MG tablet     . Multiple Vitamins-Minerals (ICAPS AREDS FORMULA PO) Take by mouth.    . neomycin-polymyxin-hydrocortisone (CORTISPORIN) 3.5-10000-1 OTIC suspension     . nystatin ointment (MYCOSTATIN) Apply to rectal area thinly twice daily for 7 days 30 g 1  . predniSONE (DELTASONE) 5 MG tablet Pt takes 7.5 mg for 2 weeks, then will go to 5 mg    . Saline 0.9 % (Soln) SOLN Cleanse left foot ulcer once daily. 250 mL 0  . triamcinolone cream (KENALOG) 0.1 % Apply topically as needed. 30 g 4  . Vitamin D, Ergocalciferol, (DRISDOL) 50000 UNITS CAPS capsule Take 1 capsule (50,000 Units total) by mouth every 7 (seven) days. 30 capsule 3  . vitamin E 100 UNIT capsule Take 100 Units by mouth daily.     No current facility-administered medications on file prior to visit.     Allergies  Allergen Reactions  . Sulfasalazine Anaphylaxis  . Aspirin Nausea Only  . Penicillins Diarrhea  . Sulfa Antibiotics     Objective:    Vascular Examination: There were no vitals filed for this visit.   Vascular Examination: Capillary refill time to digits immediate b/l. Palpable DP pulses b/l. Palpable PT pulses b/l. Pedal hair present b/l. Skin temperature gradient within normal limits b/l. Bilateral lower extremity edema L>R.  Dermatological Examination: No open wounds bilaterally. No interdigital macerations bilaterally. Ulceration submet head 5 left completely epithelialized. No edema, no erythema, no drainage, no flocculence.  Musculoskeletal Examination: Normal muscle strength 5/5 to all lower extremity muscle groups bilaterally, no pain crepitus or joint limitation noted with ROM b/l and hammertoes noted to the  1-5 bilaterally  Neurological Examination: Protective sensation intact 5/5 intact bilaterally with 10g monofilament b/l Involuntary spasms noted b/l feet  Assessment:   1.  Ulcer submet head 5 left foot healed 2.    Multiple Sclerosis 3.    Rheumatoid Arthritis  Plan: 1. Ulcer was debrided left foot. Area cleansed and antibiotic ointment applied. 2. Recommended Vascular consultation for longstanding chronic LE edema L>R. Consultation written on prescription pad for her to give to PCP.  3. Recommended change in shoe gear. I feel her straps on her leather sandals are rubbing her feet and causing friction which most likely led to her wound development of her left foot. Will have Pedorthist Rick make shoe recommendation for her since her toes claw  when she ambulates. 4. Patient instructed to report to emergency department with worsening appearance of ulcer/toe/foot, increased pain, foul odor, increased redness, swelling, drainage, fever, chills, nightsweats, nausea, vomiting, increased blood sugar.  5. Patient/POA related understanding. 6. Follow up 3 weeks for re-evaluation. Return in about 3 weeks (around 03/22/2020) for nail and callus trim.

## 2020-03-07 DIAGNOSIS — E559 Vitamin D deficiency, unspecified: Secondary | ICD-10-CM | POA: Diagnosis not present

## 2020-03-07 DIAGNOSIS — M810 Age-related osteoporosis without current pathological fracture: Secondary | ICD-10-CM | POA: Diagnosis not present

## 2020-03-07 DIAGNOSIS — E039 Hypothyroidism, unspecified: Secondary | ICD-10-CM | POA: Diagnosis not present

## 2020-03-15 ENCOUNTER — Ambulatory Visit
Admission: RE | Admit: 2020-03-15 | Discharge: 2020-03-15 | Disposition: A | Discharge status: Home / Self Care | Payer: Medicare Other | Source: Ambulatory Visit | Attending: Family | Admitting: Family

## 2020-03-15 ENCOUNTER — Ambulatory Visit (INDEPENDENT_AMBULATORY_CARE_PROVIDER_SITE_OTHER): Payer: Medicare Other | Admitting: Family

## 2020-03-15 ENCOUNTER — Other Ambulatory Visit: Payer: Self-pay | Admitting: Family

## 2020-03-15 ENCOUNTER — Other Ambulatory Visit: Payer: Self-pay

## 2020-03-15 ENCOUNTER — Encounter: Payer: Self-pay | Admitting: Family

## 2020-03-15 ENCOUNTER — Ambulatory Visit (INDEPENDENT_AMBULATORY_CARE_PROVIDER_SITE_OTHER): Payer: Medicare Other

## 2020-03-15 DIAGNOSIS — M25551 Pain in right hip: Secondary | ICD-10-CM

## 2020-03-15 DIAGNOSIS — M25559 Pain in unspecified hip: Secondary | ICD-10-CM

## 2020-03-15 DIAGNOSIS — S79911A Unspecified injury of right hip, initial encounter: Secondary | ICD-10-CM | POA: Diagnosis not present

## 2020-03-15 NOTE — Progress Notes (Signed)
Office Visit Note   Patient: [name]           Date of Birth: [birth year]           MRN: [id number] Visit Date: 03/15/2020              Requested by: [name], MD [address] [place],  [place] [address] PCP: [name], MD  No chief complaint on file.     HPI: The patient is an 86 year old woman with a history of rheumatoid arthritis as well as multiple sclerosis who states yesterday in her home she was walking and some plain children got in her way she stumbled she did not fall but had a twisting stumble and immediate onset of acute right hip pain.  She has been unable to bear weight due to pain on the right lower extremity since.  She denies pain at rest.  Describes as stabbing pain that is well localized between her groin and her buttock, a deep pain.  Denies back pain denies history of any back issues.  No numbness no tingling down her right lower extremity no weakness  Assessment & Plan: Visit Diagnoses:  1. Hip pain     Plan: Radiographs without evidence of fracture will send for CT scanning of the right hip.  Concern for inability to bear weight with acute trauma.  Follow-Up Instructions: No follow-ups on file.   Right Hip Exam   Tenderness  The patient is experiencing tenderness in the posterior.  Muscle Strength  The patient has normal right hip strength.  Tests  FABER: positive   Back Exam   Tenderness  The patient is experiencing tenderness in the sacroiliac.  Tests  Straight leg raise right: negative Straight leg raise left: negative      Patient is alert, oriented, no adenopathy, well-dressed, normal affect, normal respiratory effort.   Imaging: No results found. No images are attached to the encounter.  Labs: Lab Results  Component Value Date   ESRSEDRATE 108 (H) 04/03/2010     Lab Results  Component Value Date   ALBUMIN 4.0 02/23/2019   ALBUMIN 3.9 12/26/2016   ALBUMIN 3.8 03/12/2009     No results found for: MG No results found for: VD25OH  No results found for: PREALBUMIN CBC EXTENDED Latest Ref Rng & Units 02/23/2019 12/26/2016 03/14/2009  WBC 4.0 - 10.5 K/uL 11.0(H) 5.7 6.5  RBC 3.87 - 5.11 MIL/uL 4.76 4.39 4.06  HGB 12.0 - 15.0 g/dL 13.8 13.2 12.8  HCT 36.0 - 46.0 % 43.2 37.5 36.9  PLT 150 - 400 K/uL 260 162 146(L)  NEUTROABS 1.7 - 7.7 K/uL 8.6(H) 4.0 -  LYMPHSABS 0.7 - 4.0 K/uL 1.7 1.0 -     There is no height or weight on file to calculate BMI.  Orders:  Orders Placed This Encounter  Procedures  . XR HIP UNILAT W OR W/O PELVIS 2-3 VIEWS RIGHT   No orders of the defined types were placed in this encounter.    Procedures: No procedures performed  Clinical Data: No additional findings.  ROS:  All other systems negative, except as noted in the HPI. Review of Systems  Constitutional: Negative for chills and fever.  Musculoskeletal: Positive for arthralgias and gait problem.    Objective: Vital Signs: LMP 09/08/1970 (Approximate)   Specialty Comments:  No specialty comments available.  PMFS History: Patient Active Problem List   Diagnosis Date Noted  . Tympanic membrane central perforation 12/15/2019  .  Cholesteatoma of external auditory canal, right 09/10/2019  . Hearing loss 09/10/2019  . Chronic venous insufficiency of lower extremity 01/25/2019  . Grade II diastolic dysfunction [id number]  . Urinary dysfunction 12/22/2017  . Multiple sclerosis, primary progressive ([place]) 06/19/2017  . Left foot drop 03/17/2017  . Primary chronic progressive multiple sclerosis ([place]) 03/17/2017  . Myelopathy ([place]) 03/17/2017  . Bilateral lower extremity edema 03/17/2017   Past Medical History:  Diagnosis Date  . Chronic venous insufficiency of lower extremity 01/25/2019   With chronic bilateral lower extremity edema  . Incontinent of urine   . Multiple sclerosis ([place]) 2015  . Osteoporosis    osteopenia  . Polyarthritis   . Post-menopausal   .  Rheumatoid arthritis ([place])   . Temporal arteritis ([place]) 2011    Family History  Problem Relation Age of Onset  . Osteoarthritis Father   . Stroke Father   . Heart failure Mother   . Hypertension Mother   . Heart disease Mother   . Multiple sclerosis Cousin   . Multiple sclerosis Cousin   . Multiple sclerosis Cousin     Past Surgical History:  Procedure Laterality Date  . ABDOMINAL HYSTERECTOMY  1971   TVH  . breast ductectomy Right 05/1995  . COLONOSCOPY  10/2011   Social History   Occupational History  . Not on file  Tobacco Use  . Smoking status: Never Smoker  . Smokeless tobacco: Never Used  Substance and Sexual Activity  . Alcohol use: No  . Drug use: No  . Sexual activity: Not Currently    Partners: Male    Birth control/protection: Post-menopausal, Surgical    Comment: hysterectomy

## 2020-03-17 ENCOUNTER — Other Ambulatory Visit: Payer: Self-pay

## 2020-03-17 ENCOUNTER — Ambulatory Visit (INDEPENDENT_AMBULATORY_CARE_PROVIDER_SITE_OTHER): Payer: Medicare Other | Admitting: Podiatry

## 2020-03-17 ENCOUNTER — Encounter: Payer: Self-pay | Admitting: Podiatry

## 2020-03-17 VITALS — Temp 97.0°F

## 2020-03-17 DIAGNOSIS — M0579 Rheumatoid arthritis with rheumatoid factor of multiple sites without organ or systems involvement: Secondary | ICD-10-CM

## 2020-03-17 DIAGNOSIS — M79674 Pain in right toe(s): Secondary | ICD-10-CM

## 2020-03-17 DIAGNOSIS — B351 Tinea unguium: Secondary | ICD-10-CM | POA: Diagnosis not present

## 2020-03-17 DIAGNOSIS — M79675 Pain in left toe(s): Secondary | ICD-10-CM | POA: Diagnosis not present

## 2020-03-17 DIAGNOSIS — L84 Corns and callosities: Secondary | ICD-10-CM

## 2020-03-17 DIAGNOSIS — G35 Multiple sclerosis: Secondary | ICD-10-CM

## 2020-03-17 NOTE — Patient Instructions (Signed)

## 2020-03-20 ENCOUNTER — Ambulatory Visit (INDEPENDENT_AMBULATORY_CARE_PROVIDER_SITE_OTHER): Payer: Medicare Other | Admitting: Orthopedic Surgery

## 2020-03-20 ENCOUNTER — Other Ambulatory Visit: Payer: Self-pay

## 2020-03-20 ENCOUNTER — Encounter: Payer: Self-pay | Admitting: Physician Assistant

## 2020-03-20 ENCOUNTER — Ambulatory Visit (INDEPENDENT_AMBULATORY_CARE_PROVIDER_SITE_OTHER): Payer: Medicare Other

## 2020-03-20 VITALS — Ht 67.0 in | Wt 135.0 lb

## 2020-03-20 DIAGNOSIS — M25551 Pain in right hip: Secondary | ICD-10-CM | POA: Diagnosis not present

## 2020-03-20 NOTE — Progress Notes (Signed)
Subjective: Memory [name] presents today for follow up of follow up preulcerative lesion distal tip right 4th digit, healed ulcer submet head 5 left foot and painful mycotic nails b/l that are difficult to trim. Pain interferes with ambulation. Aggravating factors include wearing enclosed shoe gear. Pain is relieved with periodic professional debridement.   Allergies  Allergen Reactions  . Sulfasalazine Anaphylaxis  . Aspirin Nausea Only  . Penicillins Diarrhea  . Sulfa Antibiotics      Objective: Vitals:   03/17/20 1144  Temp: (!) 69 F (36.1 C)    Pt 86 y.o. year old female  in NAD. AAO x 3.   Vascular Examination:  Capillary refill time to digits immediate b/l. Palpable DP pulses b/l. Faintly palpable PT pulses b/l. Pedal hair absent b/l Skin temperature gradient within normal limits b/l. Dependent rubor noted b/l. Edema b/l LE left >right.  Dermatological Examination: Pedal skin with normal turgor, texture and tone bilaterally. No open wounds bilaterally. No interdigital macerations bilaterally. Toenails 1-5 b/l elongated, dystrophic, thickened, crumbly with subungual debris and tenderness to dorsal palpation. Hyperkeratotic lesion(s) L 2nd toe and R 3rd toe.  No erythema, no edema, no drainage, no flocculence.  Musculoskeletal: Normal muscle strength 5/5 to all lower extremity muscle groups bilaterally, no pain crepitus or joint limitation noted with ROM b/l and hammertoes noted to the  1-5 bilaterally  Neurological: Protective sensation intact 5/5 intact bilaterally with 10g monofilament b/l.  Involuntary spasms noted b/l feet.  Assessment: 1. Pain due to onychomycosis of toenails of both feet   2. Corns   3. Multiple sclerosis ([place])   4. Rheumatoid arthritis involving multiple sites with positive rheumatoid factor (HCC)    Plan: -Toenails 1-5 b/l were debrided in length and girth with sterile nail nippers and dremel without iatrogenic bleeding.  -Corn(s)  debrided L 2nd toe and R 3rd toe without complication or incident. Total number debrided=2. -Patient to continue soft, supportive shoe gear daily. -I will have Orthotics and Prosthetics evaluate her for an appropriate fitting shoe/sandal.  -I will like for her to see Vascular for chronic edema b/l LE LLE>RLE.  -Patient to report any pedal injuries to medical professional immediately. -Patient/POA to call should there be question/concern in the interim.  Return in about 4 weeks (around 04/14/2020).

## 2020-03-21 ENCOUNTER — Telehealth: Payer: Self-pay

## 2020-03-21 ENCOUNTER — Other Ambulatory Visit: Payer: Self-pay

## 2020-03-21 DIAGNOSIS — M25551 Pain in right hip: Secondary | ICD-10-CM

## 2020-03-21 NOTE — Telephone Encounter (Signed)
Order at front desk with last OV note for [facility] to pick up for therapy.

## 2020-03-21 NOTE — Telephone Encounter (Signed)
Needs HHPT right hip

## 2020-03-22 ENCOUNTER — Other Ambulatory Visit: Payer: Self-pay | Admitting: Physician Assistant

## 2020-03-22 ENCOUNTER — Encounter (HOSPITAL_COMMUNITY): Payer: Self-pay

## 2020-03-22 ENCOUNTER — Emergency Department (HOSPITAL_COMMUNITY): Payer: Medicare Other

## 2020-03-22 ENCOUNTER — Other Ambulatory Visit: Payer: Self-pay

## 2020-03-22 ENCOUNTER — Inpatient Hospital Stay (HOSPITAL_COMMUNITY)
Admission: EM | Admit: 2020-03-22 | Discharge: 2020-03-26 | DRG: 535 | Disposition: A | Discharge status: Skilled Nursing Facility | Payer: Medicare Other | Attending: Internal Medicine | Admitting: Internal Medicine

## 2020-03-22 DIAGNOSIS — Y92002 Bathroom of unspecified non-institutional (private) residence single-family (private) house as the place of occurrence of the external cause: Secondary | ICD-10-CM

## 2020-03-22 DIAGNOSIS — Z20822 Contact with and (suspected) exposure to covid-19: Secondary | ICD-10-CM | POA: Diagnosis not present

## 2020-03-22 DIAGNOSIS — Z66 Do not resuscitate: Secondary | ICD-10-CM | POA: Diagnosis not present

## 2020-03-22 DIAGNOSIS — S32491A Other specified fracture of right acetabulum, initial encounter for closed fracture: Secondary | ICD-10-CM | POA: Diagnosis not present

## 2020-03-22 DIAGNOSIS — M069 Rheumatoid arthritis, unspecified: Secondary | ICD-10-CM | POA: Diagnosis present

## 2020-03-22 DIAGNOSIS — Z7952 Long term (current) use of systemic steroids: Secondary | ICD-10-CM

## 2020-03-22 DIAGNOSIS — M25551 Pain in right hip: Secondary | ICD-10-CM | POA: Diagnosis not present

## 2020-03-22 DIAGNOSIS — S32601A Unspecified fracture of right ischium, initial encounter for closed fracture: Secondary | ICD-10-CM | POA: Diagnosis not present

## 2020-03-22 DIAGNOSIS — Z79890 Hormone replacement therapy (postmenopausal): Secondary | ICD-10-CM

## 2020-03-22 DIAGNOSIS — I10 Essential (primary) hypertension: Secondary | ICD-10-CM | POA: Diagnosis not present

## 2020-03-22 DIAGNOSIS — G35 Multiple sclerosis: Secondary | ICD-10-CM | POA: Diagnosis present

## 2020-03-22 DIAGNOSIS — S79911A Unspecified injury of right hip, initial encounter: Secondary | ICD-10-CM | POA: Diagnosis not present

## 2020-03-22 DIAGNOSIS — G35B0 Primary progressive multiple sclerosis, unspecified: Secondary | ICD-10-CM | POA: Diagnosis present

## 2020-03-22 DIAGNOSIS — Z79899 Other long term (current) drug therapy: Secondary | ICD-10-CM

## 2020-03-22 DIAGNOSIS — M810 Age-related osteoporosis without current pathological fracture: Secondary | ICD-10-CM | POA: Diagnosis present

## 2020-03-22 DIAGNOSIS — Z03818 Encounter for observation for suspected exposure to other biological agents ruled out: Secondary | ICD-10-CM | POA: Diagnosis not present

## 2020-03-22 DIAGNOSIS — W010XXA Fall on same level from slipping, tripping and stumbling without subsequent striking against object, initial encounter: Secondary | ICD-10-CM | POA: Diagnosis present

## 2020-03-22 DIAGNOSIS — R296 Repeated falls: Secondary | ICD-10-CM | POA: Diagnosis not present

## 2020-03-22 DIAGNOSIS — R52 Pain, unspecified: Secondary | ICD-10-CM | POA: Diagnosis not present

## 2020-03-22 DIAGNOSIS — W19XXXA Unspecified fall, initial encounter: Secondary | ICD-10-CM | POA: Diagnosis not present

## 2020-03-22 DIAGNOSIS — Z90710 Acquired absence of both cervix and uterus: Secondary | ICD-10-CM

## 2020-03-22 LAB — BASIC METABOLIC PANEL
Anion gap: 9 (ref 5–15)
BUN: 26 mg/dL — ABNORMAL HIGH (ref 8–23)
CO2: 25 mmol/L (ref 22–32)
Calcium: 9.7 mg/dL (ref 8.9–10.3)
Chloride: 100 mmol/L (ref 98–111)
Creatinine, Ser: 0.63 mg/dL (ref 0.44–1.00)
GFR calc Af Amer: >60 mL/min (ref >60)
GFR calc non Af Amer: >60 mL/min (ref >60)
Glucose, Bld: 108 mg/dL — ABNORMAL HIGH (ref 70–99)
Potassium: 4 mmol/L (ref 3.5–5.1)
Sodium: 134 mmol/L — ABNORMAL LOW (ref 135–145)

## 2020-03-22 LAB — CBC WITH DIFFERENTIAL/PLATELET

## 2020-03-22 LAB — CK: Total CK: 455 U/L — ABNORMAL HIGH (ref 38–234)

## 2020-03-22 MED ORDER — HYDROCODONE-ACETAMINOPHEN 5-325 MG PO TABS: 1.0000 | ORAL_TABLET | Freq: Four times a day (QID) | ORAL | 0 refills | Status: DC | PRN

## 2020-03-22 MED ORDER — SODIUM CHLORIDE 0.9 % IV BOLUS
1000.0000 mL | Freq: Once | INTRAVENOUS | Status: AC
  Administered 2020-03-23: 1000 mL via INTRAVENOUS

## 2020-03-22 MED ORDER — FENTANYL CITRATE (PF) 100 MCG/2ML IJ SOLN
50.0000 ug | Freq: Once | INTRAMUSCULAR | Status: AC
  Administered 2020-03-22: 50 ug via INTRAVENOUS
  Filled 2020-03-22: qty 2

## 2020-03-22 NOTE — ED Provider Notes (Signed)
[name] DEPT Provider Note   CSN: [id number] Arrival date & time: 03/22/20  2145     History Chief Complaint  Patient presents with  . Fall    [name] is a 86 y.o. female.  The history is provided by the patient and medical records.  Fall    86 y.o. F with hx of MS, RA, osteoporosis, presenting to the ED for frequent falls.  Patient reports over the past several weeks she has fallen numerous times.  Feels like "gravity is just becoming a lot", especially with her MS.  This morning she fell in the bathroom around 0530 and was on the tile floor for about 4 hours prior to help coming.  States she was trying to move too fast to answer the door and her right foot just did not turn like it needed to and she fell.  No head trauma.  She felt bad having to call her family to help her again so she waited a while before waking them up.  States she has been seen by her orthopedist, Dr. Sharol Given, recently and had imaging of her right hip without acute findings.  She does have cane/walker at home but states "it is too much to try to coordinate using them".  She did have her home renovated last summer to make it more handicap accessible.  She has refused SNF placement.  Dr. Sharol Given has made a referral for home health evaluation.  Her children do live nearby, her son is currently on vacation in [place].  Recently started on vicodin for pain, last dose today around 1700.  Denies any new focal numbness/weakness related to her MS.  Past Medical History:  Diagnosis Date  . Chronic venous insufficiency of lower extremity 01/25/2019   With chronic bilateral lower extremity edema  . Incontinent of urine   . Multiple sclerosis ([place]) 2015  . Osteoporosis    osteopenia  . Polyarthritis   . Post-menopausal   . Rheumatoid arthritis ([place])   . Temporal arteritis ([place]) 2011    Patient Active Problem List   Diagnosis Date Noted  . Tympanic membrane central perforation  12/15/2019  . Cholesteatoma of external auditory canal, right 09/10/2019  . Hearing loss 09/10/2019  . Chronic venous insufficiency of lower extremity 01/25/2019  . Grade II diastolic dysfunction [id number]  . Urinary dysfunction 12/22/2017  . Multiple sclerosis, primary progressive ([place]) 06/19/2017  . Left foot drop 03/17/2017  . Primary chronic progressive multiple sclerosis ([place]) 03/17/2017  . Myelopathy ([place]) 03/17/2017  . Bilateral lower extremity edema 03/17/2017    Past Surgical History:  Procedure Laterality Date  . ABDOMINAL HYSTERECTOMY  1971   TVH  . breast ductectomy Right 05/1995  . COLONOSCOPY  10/2011     OB History    Gravida  2   Para  2   Term      Preterm      AB      Living  2     SAB      TAB      Ectopic      Multiple      Live Births              Family History  Problem Relation Age of Onset  . Osteoarthritis Father   . Stroke Father   . Heart failure Mother   . Hypertension Mother   . Heart disease Mother   . Multiple sclerosis Cousin   . Multiple sclerosis Cousin   .  Multiple sclerosis Cousin     Social History   Tobacco Use  . Smoking status: Never Smoker  . Smokeless tobacco: Never Used  Substance Use Topics  . Alcohol use: No  . Drug use: No    Home Medications Prior to Admission medications   Medication Sig Start Date End Date Taking? Authorizing Provider  azelastine (ASTELIN) 137 MCG/SPRAY nasal spray Place 1 spray into the nose 2 (two) times daily as needed for allergies. Use in each nostril as directed     [provider]  Biotin 1 MG CAPS Take 1 capsule by mouth daily.     [provider]  Calcium Carbonate-Vitamin D (CALCIUM + D PO) Take 1 tablet by mouth 2 (two) times daily.     [provider]  folic acid (FOLVITE) 1 MG tablet Take by mouth.    [provider]  HYDROcodone-acetaminophen (NORCO/VICODIN) 5-325 MG tablet Take 1 tablet by mouth every 6 (six) hours as  needed for moderate pain. 03/22/20   Suzan Slick, NP  levothyroxine (SYNTHROID, LEVOTHROID) 25 MCG tablet Take 25 mcg by mouth daily. 02/17/13   [provider]  Magnesium 500 MG TABS Take 0.5 tablets by mouth every evening.     [provider]  magnesium gluconate (MAGONATE) 500 MG tablet Take by mouth.    [provider]  methotrexate 2.5 MG tablet  05/26/19   [provider]  Multiple Vitamins-Minerals (ICAPS AREDS FORMULA PO) Take by mouth.    [provider]  neomycin-polymyxin-hydrocortisone (CORTISPORIN) 3.5-10000-1 OTIC suspension  10/11/19   [provider]  nystatin ointment (MYCOSTATIN) Apply to rectal area thinly twice daily for 7 days 11/12/18   Regina Eck, CNM  predniSONE (DELTASONE) 5 MG tablet Pt takes 7.5 mg for 2 weeks, then will go to 5 mg Taking 3mg  per day - reported 03/17/2020 01/21/19   [provider]  Saline 0.9 % (Soln) SOLN Cleanse left foot ulcer once daily. 02/11/20   Marzetta Board, DPM  triamcinolone cream (KENALOG) 0.1 % Apply topically as needed. 09/27/16   Kem Boroughs, FNP  Vitamin D, Ergocalciferol, (DRISDOL) 50000 UNITS CAPS capsule Take 1 capsule (50,000 Units total) by mouth every 7 (seven) days. 04/29/14   Kem Boroughs, FNP  vitamin E 100 UNIT capsule Take 100 Units by mouth daily.    [provider]    Allergies    Sulfasalazine, Aspirin, Penicillins, and Sulfa antibiotics  Review of Systems   Review of Systems  Musculoskeletal: Positive for arthralgias.  All other systems reviewed and are negative.   Physical Exam Updated Vital Signs BP (!) 171/101 (BP Location: Left Arm)   Pulse 70   Temp 98.1 F (36.7 C) (Oral)   Resp 15   LMP 09/08/1970 (Approximate)   SpO2 98%   Physical Exam Vitals and nursing note reviewed.  Constitutional:      Appearance: She is well-developed.  HENT:     Head: Normocephalic and atraumatic.     Comments: No visible head  trauma Eyes:     Conjunctiva/sclera: Conjunctivae normal.     Pupils: Pupils are equal, round, and reactive to light.  Cardiovascular:     Rate and Rhythm: Normal rate and regular rhythm.     Heart sounds: Normal heart sounds.  Pulmonary:     Effort: Pulmonary effort is normal.     Breath sounds: Normal breath sounds.  Abdominal:     General: Bowel sounds are normal.  Palpations: Abdomen is soft.  Musculoskeletal:        General: Normal range of motion.     Cervical back: Normal range of motion.     Comments: Contusion noted to right hip along the iliac crest, there is some tenderness locally here extending down to the greater trochanter, Dpu pulse intact both feet, does have chronic edema of distal lower legs bilaterally, appears symmetric  Skin:    General: Skin is warm and dry.  Neurological:     Mental Status: She is alert and oriented to person, place, and time.     Comments: AAOx3, speech is clear and goal oriented, moving extremities well aside from right leg due to pain     ED Results / Procedures / Treatments   Labs (all labs ordered are listed, but only abnormal results are displayed) Labs Reviewed  BASIC METABOLIC PANEL - Abnormal; Notable for the following components:      Result Value   Sodium 134 (*)    Glucose, Bld 108 (*)    BUN 26 (*)    All other components within normal limits  CK - Abnormal; Notable for the following components:   Total CK 455 (*)    All other components within normal limits  URINALYSIS, ROUTINE W REFLEX MICROSCOPIC - Abnormal; Notable for the following components:   Color, Urine STRAW (*)    Ketones, ur 5 (*)    All other components within normal limits  URINE CULTURE  SARS CORONAVIRUS 2 (TAT 6-24 HRS)  CBC WITH DIFFERENTIAL/PLATELET    EKG None  Radiology DG Chest 1 View  Result Date: 03/22/2020 CLINICAL DATA:  Multiple falls with right hip pain EXAM: CHEST  1 VIEW COMPARISON:  12/08/2018 FINDINGS: Mild cardiomegaly.  No  consolidation or effusion.  No pneumothorax. IMPRESSION: No active disease.  Mild cardiomegaly. Electronically Signed   By: Donavan Foil M.D.   On: 03/22/2020 23:03   DG Hip Unilat W or Wo Pelvis 2-3 Views Right  Result Date: 03/22/2020 CLINICAL DATA:  Multiple fall EXAM: DG HIP (WITH OR WITHOUT PELVIS) 2-3V RIGHT COMPARISON:  03/20/2020 FINDINGS: SI joints are non widened. Pubic symphysis is intact. Old left superior and inferior pubic rami fractures. Bones appear osteopenic. No acute displaced fracture or malalignment. IMPRESSION: No acute osseous abnormality. CT or MRI follow-up may be pursued if continued concern for hip fracture. Old left pubic rami fractures. Electronically Signed   By: Donavan Foil M.D.   On: 03/22/2020 23:03    Procedures Procedures (including critical care time)  Medications Ordered in ED Medications  fentaNYL (SUBLIMAZE) injection 50 mcg (has no administration in time range)  predniSONE (DELTASONE) tablet 3 mg (has no administration in time range)  levothyroxine (SYNTHROID) tablet 25 mcg (has no administration in time range)  folic acid (FOLVITE) tablet 2 mg (has no administration in time range)  HYDROcodone-acetaminophen (NORCO/VICODIN) 5-325 MG per tablet 1 tablet (has no administration in time range)  Biotin CAPS 1 capsule (has no administration in time range)  fentaNYL (SUBLIMAZE) injection 50 mcg (50 mcg Intravenous Given 03/22/20 2330)  sodium chloride 0.9 % bolus 1,000 mL (1,000 mLs Intravenous New Bag/Given 03/23/20 0016)    ED Course  I have reviewed the triage vital signs and the nursing notes.  Pertinent labs & imaging results that were available during my care of the patient were reviewed by me and considered in my medical decision making (see chart for details).    MDM Rules/Calculators/A&P  86 y.o. F  here with frequent falls.  She has history of MS and RA, states "gravity is getting to be too much".  Denies new focal numbness/weakness related to  her MS.  States she has trouble getting her feet to turn.  She fell today while trying to move to fast to answer the door and fell in the bathroom.  No head injury or LOC.  She did lay on the floor for a few hours prior to being helped up.  She reports persistent pain in her right hip, has been having trouble with the same hip for quite some time.  Does have area of contusion noted along the iliac crest.  No appreciable leg shortening.  Chest chronic edema of the ankles and feet which is unchanged.  This appears symmetric, no overlying erythema or induration.  Patient has no visible signs of head trauma.  She is awake, alert, appropriately oriented.  Will obtain labs including CK and urinalysis.  Plain films of the hip and chest.  Patient's labs are reassuring.  CK is very mildly elevated.  She was given liter of fluids.  UA without any signs of infection.  Hip films are negative.  She has had recent CT of the hip without any acute finding within the past week so will not repeat today.  Chest x-ray is negative.  Will attempt to ambulate.  3:07 AM  Patient unable to ambulate-- she required 2 personal total assist.  I have talked with patient about this.  She states she has home health evaluation in the morning, however she lives alone.   Patient clearly is not going to be able to walk on her own until home health can be set up for her.  States her brother did come to help her yesterday morning but he does not live with her and unsure if he can stay (he is elderly himself).  At this point, I do not feel patient is stable or safe for discharge home.  Honestly, she may be at the point of requiring SNF placement but she is adamantly against this.  She is agreeable to admission with PT/OT evaluation.  Discussed with hospitalist, Dr. Alcario Drought-- will admit for observation.  Final Clinical Impression(s) / ED Diagnoses Final diagnoses:  Multiple falls    Rx / DC Orders ED Discharge Orders    None         Larene Pickett, PA-C 03/23/20 0326    Maudie Flakes, MD 03/24/20 [phone]

## 2020-03-22 NOTE — Telephone Encounter (Signed)
Patient called requesting pain medication. Pateint states to be in severe pain. Please send to pharmacy on file. Patient phone number is [phone]

## 2020-03-22 NOTE — Telephone Encounter (Signed)
I spoke with patient. She fell again and states that she is unable to get into her bed or walk. I explained that she would need to call EMS and be transported to the Emergency Room for evaluation this evening. She said that she is going to get someone to bring her the pain medication that Erin sent in and try to get a ride in to the office for appointment scheduled in the morning. I advised to go to ED should symptoms get worse.

## 2020-03-22 NOTE — Telephone Encounter (Signed)
This is duplicate message request has already been sent to NP to advise.

## 2020-03-22 NOTE — ED Triage Notes (Signed)
Patient in from home after multiple falls over the past week, states she fell this morning @ 0530 landed on right hip, was on the floor until her help arrived @ 0900, has bruising to hip, denies any head injury

## 2020-03-22 NOTE — Telephone Encounter (Signed)
Pt is aware that medication has been sent in for her. Also pt has fallen again and will come in tomorrow for eval.

## 2020-03-22 NOTE — Addendum Note (Signed)
Addended by: Dondra Prader R on: 03/22/2020 02:05 PM   Modules accepted: Orders

## 2020-03-22 NOTE — Telephone Encounter (Signed)
This pt was in the office and saw Dr. Sharol Given this week. I have entered the referral for [facility] and [facility] picked up the paperwork today. Please see below about medication and advise if you want to give rx for this.

## 2020-03-22 NOTE — Telephone Encounter (Signed)
Patient called this afternoon stating that she has not heard from anyone at [facility].  She also is requesting something for pain, they tylenol is not helping her at all.  CB#[phone].  Thank you.

## 2020-03-23 ENCOUNTER — Telehealth: Payer: Self-pay | Admitting: Orthopedic Surgery

## 2020-03-23 ENCOUNTER — Ambulatory Visit: Payer: Medicare Other | Admitting: Physician Assistant

## 2020-03-23 ENCOUNTER — Inpatient Hospital Stay (HOSPITAL_COMMUNITY): Payer: Medicare Other

## 2020-03-23 ENCOUNTER — Observation Stay (HOSPITAL_COMMUNITY): Payer: Medicare Other

## 2020-03-23 DIAGNOSIS — R278 Other lack of coordination: Secondary | ICD-10-CM | POA: Diagnosis not present

## 2020-03-23 DIAGNOSIS — Z79890 Hormone replacement therapy (postmenopausal): Secondary | ICD-10-CM | POA: Diagnosis not present

## 2020-03-23 DIAGNOSIS — Z79899 Other long term (current) drug therapy: Secondary | ICD-10-CM | POA: Diagnosis not present

## 2020-03-23 DIAGNOSIS — M810 Age-related osteoporosis without current pathological fracture: Secondary | ICD-10-CM | POA: Diagnosis present

## 2020-03-23 DIAGNOSIS — Z7952 Long term (current) use of systemic steroids: Secondary | ICD-10-CM | POA: Diagnosis not present

## 2020-03-23 DIAGNOSIS — G35 Multiple sclerosis: Secondary | ICD-10-CM

## 2020-03-23 DIAGNOSIS — M069 Rheumatoid arthritis, unspecified: Secondary | ICD-10-CM | POA: Diagnosis present

## 2020-03-23 DIAGNOSIS — Z20822 Contact with and (suspected) exposure to covid-19: Secondary | ICD-10-CM | POA: Diagnosis present

## 2020-03-23 DIAGNOSIS — S32601D Unspecified fracture of right ischium, subsequent encounter for fracture with routine healing: Secondary | ICD-10-CM | POA: Diagnosis not present

## 2020-03-23 DIAGNOSIS — W010XXA Fall on same level from slipping, tripping and stumbling without subsequent striking against object, initial encounter: Secondary | ICD-10-CM | POA: Diagnosis present

## 2020-03-23 DIAGNOSIS — R1312 Dysphagia, oropharyngeal phase: Secondary | ICD-10-CM | POA: Diagnosis not present

## 2020-03-23 DIAGNOSIS — M5126 Other intervertebral disc displacement, lumbar region: Secondary | ICD-10-CM | POA: Diagnosis not present

## 2020-03-23 DIAGNOSIS — I872 Venous insufficiency (chronic) (peripheral): Secondary | ICD-10-CM | POA: Diagnosis not present

## 2020-03-23 DIAGNOSIS — R2681 Unsteadiness on feet: Secondary | ICD-10-CM | POA: Diagnosis not present

## 2020-03-23 DIAGNOSIS — R296 Repeated falls: Secondary | ICD-10-CM

## 2020-03-23 DIAGNOSIS — M25551 Pain in right hip: Secondary | ICD-10-CM | POA: Diagnosis not present

## 2020-03-23 DIAGNOSIS — R32 Unspecified urinary incontinence: Secondary | ICD-10-CM | POA: Diagnosis not present

## 2020-03-23 DIAGNOSIS — Z03818 Encounter for observation for suspected exposure to other biological agents ruled out: Secondary | ICD-10-CM | POA: Diagnosis not present

## 2020-03-23 DIAGNOSIS — M8580 Other specified disorders of bone density and structure, unspecified site: Secondary | ICD-10-CM | POA: Diagnosis not present

## 2020-03-23 DIAGNOSIS — R52 Pain, unspecified: Secondary | ICD-10-CM | POA: Diagnosis not present

## 2020-03-23 DIAGNOSIS — I10 Essential (primary) hypertension: Secondary | ICD-10-CM | POA: Diagnosis present

## 2020-03-23 DIAGNOSIS — R609 Edema, unspecified: Secondary | ICD-10-CM | POA: Diagnosis not present

## 2020-03-23 DIAGNOSIS — S32401D Unspecified fracture of right acetabulum, subsequent encounter for fracture with routine healing: Secondary | ICD-10-CM | POA: Diagnosis not present

## 2020-03-23 DIAGNOSIS — Z7401 Bed confinement status: Secondary | ICD-10-CM | POA: Diagnosis not present

## 2020-03-23 DIAGNOSIS — S32601A Unspecified fracture of right ischium, initial encounter for closed fracture: Secondary | ICD-10-CM | POA: Diagnosis present

## 2020-03-23 DIAGNOSIS — Z66 Do not resuscitate: Secondary | ICD-10-CM | POA: Diagnosis present

## 2020-03-23 DIAGNOSIS — S32491A Other specified fracture of right acetabulum, initial encounter for closed fracture: Secondary | ICD-10-CM | POA: Diagnosis present

## 2020-03-23 DIAGNOSIS — Z9181 History of falling: Secondary | ICD-10-CM | POA: Diagnosis not present

## 2020-03-23 DIAGNOSIS — Y92002 Bathroom of unspecified non-institutional (private) residence single-family (private) house as the place of occurrence of the external cause: Secondary | ICD-10-CM | POA: Diagnosis not present

## 2020-03-23 DIAGNOSIS — R531 Weakness: Secondary | ICD-10-CM | POA: Diagnosis not present

## 2020-03-23 DIAGNOSIS — R41841 Cognitive communication deficit: Secondary | ICD-10-CM | POA: Diagnosis not present

## 2020-03-23 DIAGNOSIS — M2550 Pain in unspecified joint: Secondary | ICD-10-CM | POA: Diagnosis not present

## 2020-03-23 DIAGNOSIS — Z90710 Acquired absence of both cervix and uterus: Secondary | ICD-10-CM | POA: Diagnosis not present

## 2020-03-23 DIAGNOSIS — R0902 Hypoxemia: Secondary | ICD-10-CM | POA: Diagnosis not present

## 2020-03-23 LAB — URINALYSIS, ROUTINE W REFLEX MICROSCOPIC
Bilirubin Urine: NEGATIVE
Glucose, UA: NEGATIVE mg/dL
Hgb urine dipstick: NEGATIVE
Ketones, ur: 5 mg/dL — AB
Leukocytes,Ua: NEGATIVE
Nitrite: NEGATIVE
Protein, ur: NEGATIVE mg/dL
Specific Gravity, Urine: 1.006 (ref 1.005–1.030)
pH: 7 (ref 5.0–8.0)

## 2020-03-23 LAB — COMPREHENSIVE METABOLIC PANEL
ALT: 33 U/L (ref 0–44)
AST: 50 U/L — ABNORMAL HIGH (ref 15–41)
Albumin: 3.7 g/dL (ref 3.5–5.0)
Alkaline Phosphatase: 56 U/L (ref 38–126)
Anion gap: 8 (ref 5–15)
BUN: 20 mg/dL (ref 8–23)
CO2: 25 mmol/L (ref 22–32)
Calcium: 8.8 mg/dL — ABNORMAL LOW (ref 8.9–10.3)
Chloride: 104 mmol/L (ref 98–111)
Creatinine, Ser: 0.63 mg/dL (ref 0.44–1.00)
GFR calc Af Amer: >60 mL/min (ref >60)
GFR calc non Af Amer: >60 mL/min (ref >60)
Glucose, Bld: 100 mg/dL — ABNORMAL HIGH (ref 70–99)
Potassium: 3.9 mmol/L (ref 3.5–5.1)
Sodium: 137 mmol/L (ref 135–145)
Total Bilirubin: 1 mg/dL (ref 0.3–1.2)
Total Protein: 5.4 g/dL — ABNORMAL LOW (ref 6.5–8.1)

## 2020-03-23 LAB — CBC WITH DIFFERENTIAL/PLATELET
Abs Immature Granulocytes: 0 10*3/uL (ref 0.00–0.07)
Basophils Absolute: 0 10*3/uL (ref 0.0–0.1)
Basophils Relative: 0 %
Eosinophils Absolute: 0.1 10*3/uL (ref 0.0–0.5)
Eosinophils Relative: 1 %
HCT: 40 % (ref 36.0–46.0)
Hemoglobin: 13.5 g/dL (ref 12.0–15.0)
Immature Granulocytes: 0 %
Lymphocytes Relative: 50 %
Lymphs Abs: 3.4 10*3/uL (ref 0.7–4.0)
MCH: 33.3 pg (ref 26.0–34.0)
MCHC: 33.8 g/dL (ref 30.0–36.0)
MCV: 98.5 fL (ref 80.0–100.0)
Monocytes Absolute: 0.6 10*3/uL (ref 0.1–1.0)
Monocytes Relative: 8 %
Neutro Abs: 2.7 10*3/uL (ref 1.7–7.7)
Neutrophils Relative %: 41 %
Platelets: 150 10*3/uL (ref 150–400)
RBC: 4.06 MIL/uL (ref 3.87–5.11)
RDW: 13.7 % (ref 11.5–15.5)
WBC: 6.8 10*3/uL (ref 4.0–10.5)
nRBC: 0 % (ref 0.0–0.2)

## 2020-03-23 LAB — SARS CORONAVIRUS 2 BY RT PCR (DIASORIN): SARS Coronavirus 2: NEGATIVE

## 2020-03-23 LAB — TSH: TSH: 4.716 u[IU]/mL — ABNORMAL HIGH (ref 0.350–4.500)

## 2020-03-23 MED ORDER — BIOTIN 1 MG PO CAPS: 1.0000 | ORAL_CAPSULE | Freq: Every day | ORAL | Status: DC

## 2020-03-23 MED ORDER — SENNOSIDES-DOCUSATE SODIUM 8.6-50 MG PO TABS
1.0000 | ORAL_TABLET | Freq: Two times a day (BID) | ORAL | Status: DC
  Administered 2020-03-23 – 2020-03-26 (×7): 1 via ORAL
  Filled 2020-03-23 (×7): qty 1

## 2020-03-23 MED ORDER — ACETAMINOPHEN 325 MG PO TABS
650.0000 mg | ORAL_TABLET | Freq: Four times a day (QID) | ORAL | Status: DC | PRN
  Administered 2020-03-25: 650 mg via ORAL
  Filled 2020-03-23: qty 2

## 2020-03-23 MED ORDER — PREDNISONE 1 MG PO TABS
3.0000 mg | ORAL_TABLET | Freq: Every day | ORAL | Status: DC
  Administered 2020-03-23 – 2020-03-26 (×4): 3 mg via ORAL
  Filled 2020-03-23 (×4): qty 3

## 2020-03-23 MED ORDER — ENOXAPARIN SODIUM 40 MG/0.4ML ~~LOC~~ SOLN
40.0000 mg | Freq: Every day | SUBCUTANEOUS | Status: DC
  Administered 2020-03-23 – 2020-03-26 (×4): 40 mg via SUBCUTANEOUS
  Filled 2020-03-23 (×4): qty 0.4

## 2020-03-23 MED ORDER — FOLIC ACID 1 MG PO TABS
2.0000 mg | ORAL_TABLET | Freq: Every day | ORAL | Status: DC
  Administered 2020-03-23 – 2020-03-26 (×4): 2 mg via ORAL
  Filled 2020-03-23 (×4): qty 2

## 2020-03-23 MED ORDER — POLYETHYLENE GLYCOL 3350 17 G PO PACK
17.0000 g | PACK | Freq: Every day | ORAL | Status: DC
  Administered 2020-03-26: 17 g via ORAL
  Filled 2020-03-23 (×3): qty 1

## 2020-03-23 MED ORDER — GADOBUTROL 1 MMOL/ML IV SOLN
6.0000 mL | Freq: Once | INTRAVENOUS | Status: AC | PRN
  Administered 2020-03-23: 6 mL via INTRAVENOUS

## 2020-03-23 MED ORDER — ACETAMINOPHEN 650 MG RE SUPP: 650.0000 mg | Freq: Four times a day (QID) | RECTAL | Status: DC | PRN

## 2020-03-23 MED ORDER — HYDROCODONE-ACETAMINOPHEN 5-325 MG PO TABS
1.0000 | ORAL_TABLET | Freq: Four times a day (QID) | ORAL | Status: DC | PRN
  Administered 2020-03-23 – 2020-03-24 (×3): 1 via ORAL
  Filled 2020-03-23 (×4): qty 1

## 2020-03-23 MED ORDER — FENTANYL CITRATE (PF) 100 MCG/2ML IJ SOLN
50.0000 ug | Freq: Once | INTRAMUSCULAR | Status: AC
  Administered 2020-03-23: 50 ug via INTRAVENOUS
  Filled 2020-03-23: qty 2

## 2020-03-23 MED ORDER — HYDROCODONE-ACETAMINOPHEN 5-325 MG PO TABS
1.0000 | ORAL_TABLET | Freq: Once | ORAL | Status: AC
  Administered 2020-03-23: 1 via ORAL

## 2020-03-23 MED ORDER — METHOTREXATE 2.5 MG PO TABS
20.0000 mg | ORAL_TABLET | ORAL | Status: DC
  Administered 2020-03-23: 20 mg via ORAL
  Filled 2020-03-23: qty 8

## 2020-03-23 MED ORDER — ONDANSETRON HCL 4 MG/2ML IJ SOLN: 4.0000 mg | Freq: Four times a day (QID) | INTRAMUSCULAR | Status: DC | PRN

## 2020-03-23 MED ORDER — ONDANSETRON HCL 4 MG PO TABS: 4.0000 mg | ORAL_TABLET | Freq: Four times a day (QID) | ORAL | Status: DC | PRN

## 2020-03-23 MED ORDER — LEVOTHYROXINE SODIUM 25 MCG PO TABS
25.0000 ug | ORAL_TABLET | Freq: Every day | ORAL | Status: DC
  Administered 2020-03-23 – 2020-03-26 (×4): 25 ug via ORAL
  Filled 2020-03-23 (×4): qty 1

## 2020-03-23 MED ORDER — METHOTREXATE 2.5 MG PO TABS: 20.0000 mg | ORAL_TABLET | ORAL | Status: DC

## 2020-03-23 NOTE — Consult Note (Addendum)
[facility] Nurse Consult Note: Reason for Consult: Consult requested for LLE.  Pt states she is followed by a podiatrist prior to admission for 2 chronic healing wounds to left foot.  She has generalized edema and erythemia to LLE and states she was wearing compression stockings for this prior to admission.  Wound type: Left outer plantar foot with dry callous, approx 1X1cm, slightly raised and hard.  No open wound, drainage, or fluctuance.  No topical treatment is indicated.  Pt states her podiatrist trims the location periodically Left anterior foot with healing full thickness wound; pt states it has greatly improved.  .2X.2X.1cm, red and dry; no odor, drainage, or fluctuance.  Dressing procedure/placement/frequency: Topical treatment orders provided for the bedside nurses to perform daily as follows to protect from further injury: Foam dressing to left anterior foot, change Q 3 days or PRN soiling. Pt can resume wearing compression stockings after discharge and follow-up with her podiatrist after discharge  Please re-consult if further assistance is needed.  Thank-you,  Julien Girt MSN, [place], [place], [place], [place]

## 2020-03-23 NOTE — Telephone Encounter (Signed)
I reviewed her images, the hip socket fracture can be treated non operatively, she will need SNF for rehab

## 2020-03-23 NOTE — Progress Notes (Signed)
Seen and examined, admitted earlier this morning by Dr. Alcario Drought, please see his H&P for details. -Briefly this is an 86 year old female with history of primary progressive multiple sclerosis, rheumatoid arthritis who has been falling frequently over the last 7 to 10 days, patient reports that her legs have been buckling, denies any unilateral symptoms she did see Dr. Sharol Given recently after one of her falls and had unremarkable imaging studies then. -Yesterday morning she fell in the bathroom onto her right hip and had severe pain and limited mobility, and subsequently brought to the ED.  Right hip pain, fall Acetabular and right ischial fracture noted on MRI today -Called and discussed with orthopedics Dr. Sharol Given, recommended nonoperative management, consideration of rehab and weightbearing as tolerated -PT OT -Social work consult -Pain control  History of primary progressive multiple sclerosis -With recent frequent falls for the last 7 to 10 days -Will check MRI LS spine with contrast to evaluate for MS flare  History of RA -Continue prednisone 3 mg daily -Also takes methotrexate weekly  Domenic Polite MD

## 2020-03-23 NOTE — Telephone Encounter (Signed)
You saw this pt in the office and she had another fall yesterday. Went ot the hospital and MRI was obtained. Please see results. Pt wanted to make you aware that she is there.

## 2020-03-23 NOTE — H&P (Addendum)
History and Physical    [name] [id number] DOB: Apr 18, 1932 DOA: 03/22/2020  PCP: [name], MD  Patient coming from: Home  I have personally briefly reviewed patient's old medical records in [place]  Chief Complaint: Frequent falls  HPI: [name] is a 86 y.o. female with medical history significant of MS and RA.  Pt with frequent falls over past few weeks.  She fell today while trying to move to fast to answer the door and fell in the bathroom.  No head injury or LOC.  She did lay on the floor for a few hours prior to being helped up.  She reports persistent pain in her right hip, has been having trouble with the same hip for quite some time.  Dr. Sharol Given recently CTd hip but CT result wasn't very impressive: "Subtle linear lucency within the subchondral portion of the acetabular roof seen only on axial imaging which could reflect a prominent vascular channel versus a nondisplaced fracture".  Looks like they repeated CT same day but felt it was "No acute hip fracture or AVN."  MRI suggested if symptoms persist.   ED Course: No fracture on X ray.  Unable to ambulate with 2 person assist.   Review of Systems: As per HPI, otherwise all review of systems negative.  Past Medical History:  Diagnosis Date  . Chronic venous insufficiency of lower extremity 01/25/2019   With chronic bilateral lower extremity edema  . Incontinent of urine   . Multiple sclerosis ([place]) 2015  . Osteoporosis    osteopenia  . Polyarthritis   . Post-menopausal   . Rheumatoid arthritis ([place])   . Temporal arteritis ([place]) 2011    Past Surgical History:  Procedure Laterality Date  . ABDOMINAL HYSTERECTOMY  1971   TVH  . breast ductectomy Right 05/1995  . COLONOSCOPY  10/2011     reports that she has never smoked. She has never used smokeless tobacco. She reports that she does not drink alcohol or use drugs.  Allergies  Allergen Reactions  . Sulfasalazine  Anaphylaxis  . Aspirin Nausea Only  . Penicillins Diarrhea  . Sulfa Antibiotics     Family History  Problem Relation Age of Onset  . Osteoarthritis Father   . Stroke Father   . Heart failure Mother   . Hypertension Mother   . Heart disease Mother   . Multiple sclerosis Cousin   . Multiple sclerosis Cousin   . Multiple sclerosis Cousin      Prior to Admission medications   Medication Sig Start Date End Date Taking? Authorizing Provider  azelastine (ASTELIN) 137 MCG/SPRAY nasal spray Place 1 spray into the nose 2 (two) times daily as needed for allergies. Use in each nostril as directed    Yes [provider]  Biotin 1 MG CAPS Take 1 capsule by mouth daily.    Yes [provider]  Calcium Carbonate-Vitamin D (CALCIUM + D PO) Take 1 tablet by mouth 2 (two) times daily.    Yes [provider]  folic acid (FOLVITE) 1 MG tablet Take 2 mg by mouth daily.    Yes [provider]  HYDROcodone-acetaminophen (NORCO/VICODIN) 5-325 MG tablet Take 1 tablet by mouth every 6 (six) hours as needed for moderate pain. 03/22/20  Yes Dondra Prader R, NP  levothyroxine (SYNTHROID, LEVOTHROID) 25 MCG tablet Take 25 mcg by mouth daily. 02/17/13  Yes [provider]  Magnesium 500 MG TABS Take 0.5 tablets by mouth every evening.  Yes [provider]  methotrexate 2.5 MG tablet Take 20 mg by mouth daily.  05/26/19  Yes [provider]  Multiple Vitamins-Minerals (ICAPS AREDS FORMULA PO) Take 1 capsule by mouth daily.    Yes [provider]  predniSONE (DELTASONE) 1 MG tablet Take 3 mg by mouth daily. 03/21/20  Yes [provider]  Sarilumab [facility] [place]) Inject into the skin every 14 (fourteen) days.   Yes [provider]  Vitamin D, Ergocalciferol, (DRISDOL) 50000 UNITS CAPS capsule Take 1 capsule (50,000 Units total) by mouth every 7 (seven) days. 04/29/14  Yes Kem Boroughs, FNP  Saline 0.9 % (Soln) SOLN Cleanse left foot  ulcer once daily. 02/11/20   Marzetta Board, DPM    Physical Exam: Vitals:   03/22/20 2159 03/22/20 2300 03/23/20 0200  BP: (!) 171/101 (!) 151/69 (!) 177/78  Pulse: 70 65 70  Resp: 15 14 15   Temp: 98.1 F (36.7 C)    TempSrc: Oral    SpO2: 98% 96% 97%    Constitutional: NAD, calm, comfortable Eyes: PERRL, lids and conjunctivae normal ENMT: Mucous membranes are moist. Posterior pharynx clear of any exudate or lesions.Normal dentition.  Neck: normal, supple, no masses, no thyromegaly Respiratory: clear to auscultation bilaterally, no wheezing, no crackles. Normal respiratory effort. No accessory muscle use.  Cardiovascular: Regular rate and rhythm, no murmurs / rubs / gallops. No extremity edema. 2+ pedal pulses. No carotid bruits.  Abdomen: no tenderness, no masses palpated. No hepatosplenomegaly. Bowel sounds positive.  Musculoskeletal: no clubbing / cyanosis. No joint deformity upper and lower extremities. Good ROM, no contractures. Normal muscle tone.  Skin: no rashes, lesions, ulcers. No induration Neurologic: CN 2-12 grossly intact. Sensation intact, DTR normal. Strength 5/5 in all 4.  Psychiatric: Normal judgment and insight. Alert and oriented x 3. Normal mood.    Labs on Admission: I have personally reviewed following labs and imaging studies  CBC: Recent Labs  Lab 03/22/20 2229  WBC 6.8  NEUTROABS 2.7  HGB 13.5  HCT 40.0  MCV 98.5  PLT [id number]   Basic Metabolic Panel: Recent Labs  Lab 03/22/20 2229  NA 134*  K 4.0  CL 100  CO2 25  GLUCOSE 108*  BUN 26*  CREATININE 0.63  CALCIUM 9.7   GFR: Estimated Creatinine Clearance: 49.7 mL/min (by C-G formula based on SCr of 0.63 mg/dL). Liver Function Tests: No results for input(s): AST, ALT, ALKPHOS, BILITOT, PROT, ALBUMIN in the last 168 hours. No results for input(s): LIPASE, AMYLASE in the last 168 hours. No results for input(s): AMMONIA in the last 168 hours. Coagulation Profile: No results for input(s):  INR, PROTIME in the last 168 hours. Cardiac Enzymes: Recent Labs  Lab 03/22/20 2229  CKTOTAL 455*   BNP (last 3 results) No results for input(s): PROBNP in the last 8760 hours. HbA1C: No results for input(s): HGBA1C in the last 72 hours. CBG: No results for input(s): GLUCAP in the last 168 hours. Lipid Profile: No results for input(s): CHOL, HDL, LDLCALC, TRIG, CHOLHDL, LDLDIRECT in the last 72 hours. Thyroid Function Tests: No results for input(s): TSH, T4TOTAL, FREET4, T3FREE, THYROIDAB in the last 72 hours. Anemia Panel: No results for input(s): VITAMINB12, FOLATE, FERRITIN, TIBC, IRON, RETICCTPCT in the last 72 hours. Urine analysis:    Component Value Date/Time   COLORURINE STRAW (A) 03/23/2020 0201   APPEARANCEUR CLEAR 03/23/2020 0201   LABSPEC 1.006 03/23/2020 0201   PHURINE 7.0 03/23/2020 0201   GLUCOSEU NEGATIVE 03/23/2020 0201  HGBUR NEGATIVE 03/23/2020 0201   BILIRUBINUR NEGATIVE 03/23/2020 0201   KETONESUR 5 (A) 03/23/2020 0201   PROTEINUR NEGATIVE 03/23/2020 0201   NITRITE NEGATIVE 03/23/2020 0201   LEUKOCYTESUR NEGATIVE 03/23/2020 0201    Radiological Exams on Admission: DG Chest 1 View  Result Date: 03/22/2020 CLINICAL DATA:  Multiple falls with right hip pain EXAM: CHEST  1 VIEW COMPARISON:  12/08/2018 FINDINGS: Mild cardiomegaly.  No consolidation or effusion.  No pneumothorax. IMPRESSION: No active disease.  Mild cardiomegaly. Electronically Signed   By: Donavan Foil M.D.   On: 03/22/2020 23:03   DG Hip Unilat W or Wo Pelvis 2-3 Views Right  Result Date: 03/22/2020 CLINICAL DATA:  Multiple fall EXAM: DG HIP (WITH OR WITHOUT PELVIS) 2-3V RIGHT COMPARISON:  03/20/2020 FINDINGS: SI joints are non widened. Pubic symphysis is intact. Old left superior and inferior pubic rami fractures. Bones appear osteopenic. No acute displaced fracture or malalignment. IMPRESSION: No acute osseous abnormality. CT or MRI follow-up may be pursued if continued concern for hip  fracture. Old left pubic rami fractures. Electronically Signed   By: Donavan Foil M.D.   On: 03/22/2020 23:03    EKG: Independently reviewed.  Assessment/Plan Principal Problem:   Frequent falls Active Problems:   Multiple sclerosis, primary progressive (HCC)   RA (rheumatoid arthritis) ([place])    1. Frequent falls - 1. PT/OT 2. Case management consult 2. R hip pain - 1. Will go ahead and get MRI of R hip as suggested on prior CTs 3. MS and RA 1. Cont prednisone 2. Takes Sarilumab with dose yesterday (supposed to be every 14 days). 3. Cont home MTX: pt takes 8 tabs, weekly, on Thursday's  DVT prophylaxis: Lovenox Code Status: Full Family Communication: No family in room Disposition Plan: TBD Consults called: None Admission status: Place in 71   Leona Pressly, [place] Hospitalists  How to contact the [facility] Attending or Consulting provider [place] or covering provider during after hours [place], for this patient?  1. Check the care team in [facility] and look for a) attending/consulting TR[facility] provider listed and b) the [facility] team listed 2. Log into [email or web address]  Amion Physician Scheduling and messaging for groups and whole hospitals  On call and physician scheduling software for group practices, residents, hospitalists and other medical providers for call, clinic, rotation and shift schedules. OnCall [employer] is a hospital-wide system for scheduling doctors and paging doctors on call. EasyPlot is for scientific plotting and data analysis.  [email or web address]  and use [facility]'s universal password to access. If you do not have the password, please contact the hospital operator.  3. Locate the [facility] provider you are looking for under [facility] and page to a number that you can be directly reached. 4. If you still have difficulty reaching the provider, please page the [facility] (Director on Call) for the Hospitalists listed on amion for assistance.  03/23/2020, 3:29 AM

## 2020-03-23 NOTE — Telephone Encounter (Signed)
Patient called stated she wanted to inform Dr. Sharol Given is has been hospitalized from a fall. Patient phone number is [phone]

## 2020-03-23 NOTE — Progress Notes (Signed)
PHARMACIST - PHYSICIAN ORDER COMMUNICATION  CONCERNING: P&T Medication Policy on Herbal Medications  DESCRIPTION:  This patient's order for:  biotin  has been noted.  This product(s) is classified as an "herbal" or natural product. Due to a lack of definitive safety studies or FDA approval, nonstandard manufacturing practices, plus the potential risk of unknown drug-drug interactions while on inpatient medications, the Pharmacy and Therapeutics Committee does not permit the use of "herbal" or natural products of this type within [facility].   ACTION TAKEN: The pharmacy department is unable to verify this order at this time and your patient has been informed of this safety policy. Please reevaluate patient's clinical condition at discharge and address if the herbal or natural product(s) should be resumed at that time.   Thanks Dorrene German 03/23/2020 4:59 AM

## 2020-03-23 NOTE — Evaluation (Signed)
Physical Therapy Evaluation Patient Details Name: Idris Karpinski MRN: [id number] DOB: 1932/03/06 Today's Date: 03/23/2020   History of Present Illness  86 year old female with history of primary progressive multiple sclerosis, rheumatoid arthritis who has been falling frequently over the last 7 to 10 days, patient reports that her legs have been buckling, denies any unilateral symptoms she did see Dr. Sharol Given recently after one of her falls and had unremarkable imaging studies then.  Pt presented to ED after another fall with right hip pain.  Acetabular and right ischial fracture noted on MRI.  Plan for nonsurgical management and WBAT.  Clinical Impression  Pt admitted with above diagnosis.  Pt currently with functional limitations due to the deficits listed below (see PT Problem List). Pt will benefit from skilled PT to increase their independence and safety with mobility to allow discharge to the venue listed below.  Pt requiring increased assist for bed mobility and transfers at this time.  Pt unable to weight shift in order to ambulation so only OOB to recliner today.  Pt with increased pain in R LE with weight bearing.  Recommend SNF at this time and pt is agreeable.  * Pt also stated that she stopped taking her MS medication in order to receive her COVID vaccinations (notified MD). Uncertain of when she restarted medication.  Her frequent falls due to "legs giving out" started about one week ago.    Follow Up Recommendations SNF    Equipment Recommendations  Other (comment)(if home, pt would benefit from narrow w/c with cushion)    Recommendations for Other Services       Precautions / Restrictions Precautions Precautions: Fall Restrictions Weight Bearing Restrictions: No      Mobility  Bed Mobility Overal bed mobility: Needs Assistance Bed Mobility: Rolling;Sidelying to Sit Rolling: Min assist;+2 for safety/equipment Sidelying to sit: Mod assist;+2 for safety/equipment        General bed mobility comments: cues for technique however pt assisting least painful way  Transfers Overall transfer level: Needs assistance Equipment used: Rolling walker (2 wheeled) Transfers: Sit to/from [facility] Sit to Stand: Mod assist;+2 physical assistance Stand pivot transfers: Min assist;+2 safety/equipment       General transfer comment: verbal cues for UE and LE positioning, pt needing to have both hands on RW so held RW from movement, required assist to rise with cues for technique, pt unable to weight shift so pivoted by keeping feet together and pivoting  Ambulation/Gait                Stairs            Wheelchair Mobility    Modified Rankin (Stroke Patients Only)       Balance Overall balance assessment: Needs assistance;History of Falls         Standing balance support: Bilateral upper extremity supported Standing balance-Leahy Scale: Zero Standing balance comment: UE and external assist required                             Pertinent Vitals/Pain Pain Assessment: 0-10 Pain Score: 8  Pain Location: R pelvis Pain Descriptors / Indicators: Grimacing;Guarding;Spasm;Sharp Pain Intervention(s): Monitored during session;Repositioned;Other (comment)(RN notified of pain, pt declines meds)    Home Living Family/patient expects to be discharged to:: Private residence Living Arrangements: Alone Available Help at Discharge: Friend(s);Family;Available PRN/intermittently Type of Home: House Home Access: Other (comment)(reports ramp is being built on Monday)  Home Layout: Able to live on main level with bedroom/bathroom Home Equipment: Walker - 4 wheels      Prior Function Level of Independence: Independent with assistive device(s)         Comments: pt was using rollator around home except in bathroom due to narrow door (reporting leaning on countertops); started with frequent falls about one week ago and  requiring more assist to mobilize leading to admission     Hand Dominance        Extremity/Trunk Assessment        Lower Extremity Assessment Lower Extremity Assessment: Generalized weakness;LLE deficits/detail;RLE deficits/detail RLE Deficits / Details: pt self assisting LE RLE: Unable to fully assess due to pain LLE Deficits / Details: edematous and ankle/foot deformity observed, pt states she tried brace however did not work so she does not use one       Communication   Communication: No difficulties  Cognition Arousal/Alertness: Awake/alert Behavior During Therapy: WFL for tasks assessed/performed Overall Cognitive Status: Within Functional Limits for tasks assessed                                 General Comments: very loquacious      General Comments      Exercises     Assessment/Plan    PT Assessment Patient needs continued PT services  PT Problem List Decreased balance;Decreased activity tolerance;Decreased strength;Decreased mobility;Pain;Decreased knowledge of use of DME       PT Treatment Interventions DME instruction;Therapeutic activities;Gait training;Therapeutic exercise;Functional mobility training;Balance training;Wheelchair mobility training;Patient/family education    PT Goals (Current goals can be found in the Care Plan section)  Acute Rehab PT Goals PT Goal Formulation: With patient Time For Goal Achievement: 03/30/20 Potential to Achieve Goals: Good    Frequency Min 3X/week   Barriers to discharge        Co-evaluation PT/OT/SLP Co-Evaluation/Treatment: Yes Reason for Co-Treatment: For patient/therapist safety;To address functional/ADL transfers PT goals addressed during session: Mobility/safety with mobility OT goals addressed during session: ADL's and self-care       AM-PAC PT "6 Clicks" Mobility  Outcome Measure Help needed turning from your back to your side while in a flat bed without using bedrails?: A  Lot Help needed moving from lying on your back to sitting on the side of a flat bed without using bedrails?: A Lot Help needed moving to and from a bed to a chair (including a wheelchair)?: A Lot Help needed standing up from a chair using your arms (e.g., wheelchair or bedside chair)?: A Lot Help needed to walk in hospital room?: A Lot Help needed climbing 3-5 steps with a railing? : Total 6 Click Score: 11    End of Session Equipment Utilized During Treatment: Gait belt Activity Tolerance: Patient tolerated treatment well Patient left: in chair;with call bell/phone within reach(agreeable to call for assist out of recliner) Nurse Communication: Mobility status PT Visit Diagnosis: Unsteadiness on feet (R26.81);Difficulty in walking, not elsewhere classified (R26.2)    Time: [id number] PT Time Calculation (min) (ACUTE ONLY): 42 min   Charges:   PT Evaluation $PT Eval Moderate Complexity: [place], DPT Acute Rehabilitation Services Office: [phone]  Kalana Yust,KATHrine E 03/23/2020, 1:30 PM

## 2020-03-23 NOTE — ED Notes (Signed)
I/O cath unsuccessful. Georgina Snell, RN and this Probation officer assisted pt with getting to bedside commode to use restroom and obtained urine sample. Pt required full support from both staff members.

## 2020-03-23 NOTE — TOC Initial Note (Signed)
Transition of Care [facility]) - Initial/Assessment Note    Patient Details  Name: [name] MRN: [id number] Date of Birth: [birth year]  Transition of Care [facility]) CM/SW Contact:    Lennart Pall, LCSW Phone Number: 03/23/2020, 4:35 PM  Clinical Narrative:    Met with pt to discuss d/c plans/ needs.  She is very pleasant, talkative and is in agreement with recommendations for SNF rehab.  Notes that she here two children are living in [place] and [place].  They are supportive, however, no local family to provide assistance. Pt does not have a facility preference.  Will begin SNF bed search.               Expected Discharge Plan: Skilled Nursing Facility Barriers to Discharge: Continued Medical Work up   Patient Goals and CMS Choice Patient states their goals for this hospitalization and ongoing recovery are:: "I know I need to go somewhere for rehab but then I want to go home." CMS Medicare.gov Compare Post Acute Care list provided to:: Patient Choice offered to / list presented to : Patient  Expected Discharge Plan and Services Expected Discharge Plan: [place] In-house Referral: Clinical Social Work     Living arrangements for the past 2 months: Single Family Home                 DME Arranged: N/A DME Agency: NA                  Prior Living Arrangements/Services Living arrangements for the past 2 months: Single Family Home Lives with:: Self Patient language and need for interpreter reviewed:: Yes Do you feel safe going back to the place where you live?: Yes(after SNF rehab)      Need for Family Participation in Patient Care: No (Comment) Care giver support system in place?: Yes (comment)(SNF) Current home services: Housekeeping Criminal Activity/Legal Involvement Pertinent to Current Situation/Hospitalization: No - Comment as needed  Activities of Daily Living Home Assistive Devices/Equipment: Bedside commode/3-in-1, Other (Comment)(Rolator with  seat) ADL Screening (condition at time of admission) Patient's cognitive ability adequate to safely complete daily activities?: Yes Is the patient deaf or have difficulty hearing?: Yes Does the patient have difficulty seeing, even when wearing glasses/contacts?: No Does the patient have difficulty concentrating, remembering, or making decisions?: No Patient able to express need for assistance with ADLs?: Yes Does the patient have difficulty dressing or bathing?: Yes Independently performs ADLs?: No Communication: Independent Dressing (OT): Dependent Is this a change from baseline?: Change from baseline, expected to last >3 days Grooming: Needs assistance Is this a change from baseline?: Change from baseline, expected to last >3 days Feeding: Independent Bathing: Dependent Is this a change from baseline?: Change from baseline, expected to last >3 days Toileting: Dependent Is this a change from baseline?: Change from baseline, expected to last >3days In/Out Bed: Dependent Is this a change from baseline?: Change from baseline, expected to last >3 days Walks in Home: Dependent Is this a change from baseline?: Change from baseline, expected to last >3 days Does the patient have difficulty walking or climbing stairs?: Yes Weakness of Legs: Both Weakness of Arms/Hands: None  Permission Sought/Granted Permission sought to share information with : Facility Sport and exercise psychologist, Family Supports Permission granted to share information with : Yes, Verbal Permission Granted  Share Information with NAME: Edward Jolly     Permission granted to share info w Relationship: daughter  Permission granted to share info w Contact Information: [phone]  Emotional Assessment  Appearance:: Appears stated age Attitude/Demeanor/Rapport: Gracious, Engaged Affect (typically observed): Pleasant, Accepting Orientation: : Oriented to Self, Oriented to Place, Oriented to  Time, Oriented to  Situation Alcohol / Substance Use: Not Applicable Psych Involvement: No (comment)  Admission diagnosis:  Frequent falls [R29.6] Multiple falls [R29.6] Patient Active Problem List   Diagnosis Date Noted  . Frequent falls 03/23/2020  . RA (rheumatoid arthritis) ([place]) 03/23/2020  . Tympanic membrane central perforation 12/15/2019  . Cholesteatoma of external auditory canal, right 09/10/2019  . Hearing loss 09/10/2019  . Chronic venous insufficiency of lower extremity 01/25/2019  . Grade II diastolic dysfunction 94/32/7614  . Urinary dysfunction 12/22/2017  . Multiple sclerosis, primary progressive ([place]) 06/19/2017  . Left foot drop 03/17/2017  . Primary chronic progressive multiple sclerosis ([place]) 03/17/2017  . Myelopathy ([place]) 03/17/2017  . Bilateral lower extremity edema 03/17/2017   PCP:  [name], MD Pharmacy:   [facility] [address] [place] [address] Phone: [phone] Fax: [phone]     Social Determinants of Health (SDOH) Interventions    Readmission Risk Interventions Readmission Risk Prevention Plan 03/23/2020  Post Dischage Appt Complete  Medication Screening Complete  Transportation Screening Complete  Some recent data might be hidden

## 2020-03-23 NOTE — Evaluation (Signed)
Occupational Therapy Evaluation Patient Details Name: [name] MRN: [id number] DOB: [birth year] Today's Date: 03/23/2020    History of Present Illness 86 year old female with history of primary progressive multiple sclerosis, rheumatoid arthritis who has been falling frequently over the last 7 to 10 days, patient reports that her legs have been buckling, denies any unilateral symptoms she did see Dr. Sharol Given recently after one of her falls and had unremarkable imaging studies then.  Pt presented to ED after another fall with right hip pain.  Acetabular and right ischial fracture noted on MRI.  Plan for nonsurgical management and WBAT.   Clinical Impression   Patient with deficits listed below and requiring increased assistance for self care and functional transfers. Currently patient is max to total A for LB bathing/dressing, mod A x2 for functional transfers and total A for peri care. Patient unable to clear feet to take full steps with transfer, keeps LEs together and pivots over to recliner chair. Recommend continued acute OT services to maximize patient safety and independence.     Follow Up Recommendations  SNF    Equipment Recommendations  Other (comment)(defer to next venue)       Precautions / Restrictions Precautions Precautions: Fall Restrictions Weight Bearing Restrictions: No      Mobility Bed Mobility Overal bed mobility: Needs Assistance Bed Mobility: Rolling;Sidelying to Sit Rolling: Min assist;+2 for safety/equipment Sidelying to sit: Mod assist;+2 for safety/equipment       General bed mobility comments: cues for sequencing/technique  Transfers Overall transfer level: Needs assistance Equipment used: Rolling walker (2 wheeled) Transfers: Sit to/from [facility] Sit to Stand: Mod assist;+2 physical assistance Stand pivot transfers: Min assist;+2 safety/equipment       General transfer comment: patient needing to pull up on walker,  requires assist to power up to standing    Balance Overall balance assessment: Needs assistance;History of Falls Sitting-balance support: Single extremity supported;Feet supported Sitting balance-Leahy Scale: Fair     Standing balance support: Bilateral upper extremity supported;During functional activity Standing balance-Leahy Scale: Zero Standing balance comment: UE and external assist required                           ADL either performed or assessed with clinical judgement   ADL Overall ADL's : Needs assistance/impaired     Grooming: Set up;Sitting   Upper Body Bathing: Min guard;Sitting   Lower Body Bathing: Maximal assistance;Total assistance;Sitting/lateral leans   Upper Body Dressing : Min guard;Sitting   Lower Body Dressing: Total assistance;Maximal assistance;Sitting/lateral leans;Bed level Lower Body Dressing Details (indicate cue type and reason): pt attempt to don socks bed level however too much pain Toilet Transfer: Moderate assistance;+2 for physical assistance;+2 for safety/equipment;BSC;RW Toilet Transfer Details (indicate cue type and reason): simulated with chair transfer, patient sliding feet along floor, unable to clear feet to take steps Toileting- Clothing Manipulation and Hygiene: Total assistance       Functional mobility during ADLs: Moderate assistance;+2 for physical assistance;+2 for safety/equipment;Rolling walker;Cueing for sequencing;Cueing for safety General ADL Comments: patient requires increased assistance for self care tasks due to decreased activity tolerance, balance, safety awareness, strength and increased pain with mobility                  Pertinent Vitals/Pain Pain Assessment: Faces Pain Score: 8  Faces Pain Scale: Hurts whole lot Pain Location: R pelvis Pain Descriptors / Indicators: Grimacing;Guarding;Spasm;Sharp Pain Intervention(s): Monitored during session     Hand  Dominance Right   Extremity/Trunk  Assessment Upper Extremity Assessment Upper Extremity Assessment: Generalized weakness   Lower Extremity Assessment Lower Extremity Assessment: Defer to PT evaluation        Communication Communication Communication: No difficulties   Cognition Arousal/Alertness: Awake/alert Behavior During Therapy: WFL for tasks assessed/performed Overall Cognitive Status: Within Functional Limits for tasks assessed                                 General Comments: tangential              Home Living Family/patient expects to be discharged to:: Private residence Living Arrangements: Alone Available Help at Discharge: Friend(s);Family;Available PRN/intermittently Type of Home: House Home Access: Other (comment)(reports ramp being built Monday)     Home Layout: Able to live on main level with bedroom/bathroom     Bathroom Shower/Tub: Occupational psychologist: Handicapped height Bathroom Accessibility: No   Home Equipment: Environmental consultant - 4 wheels;Hand held shower head;Other (comment)(3 wheeled walker)          Prior Functioning/Environment Level of Independence: Independent with assistive device(s)        Comments: pt was using rollator around home except in bathroom due to narrow door (reporting leaning on countertops); started with frequent falls about one week ago and requiring more assist to mobilize leading to admission        OT Problem List: Decreased strength;Decreased activity tolerance;Impaired balance (sitting and/or standing);Decreased safety awareness;Decreased knowledge of use of DME or AE;Pain      OT Treatment/Interventions: Self-care/ADL training;Therapeutic exercise;DME and/or AE instruction;Therapeutic activities;Patient/family education;Balance training    OT Goals(Current goals can be found in the care plan section) Acute Rehab OT Goals Patient Stated Goal: to get moving OT Goal Formulation: With patient Time For Goal Achievement:  04/06/20 Potential to Achieve Goals: Good  OT Frequency: Min 2X/week           Co-evaluation PT/OT/SLP Co-Evaluation/Treatment: Yes Reason for Co-Treatment: Complexity of the patient's impairments (multi-system involvement);For patient/therapist safety;To address functional/ADL transfers PT goals addressed during session: Mobility/safety with mobility OT goals addressed during session: ADL's and self-care      AM-PAC OT "6 Clicks" Daily Activity     Outcome Measure Help from another person eating meals?: None Help from another person taking care of personal grooming?: A Little Help from another person toileting, which includes using toliet, bedpan, or urinal?: A Lot Help from another person bathing (including washing, rinsing, drying)?: A Lot Help from another person to put on and taking off regular upper body clothing?: A Little Help from another person to put on and taking off regular lower body clothing?: Total 6 Click Score: 15   End of Session Equipment Utilized During Treatment: Gait belt;Rolling walker  Activity Tolerance: Patient limited by pain;Patient tolerated treatment well Patient left: in chair;with call bell/phone within reach;with family/visitor present  OT Visit Diagnosis: Unsteadiness on feet (R26.81);Other abnormalities of gait and mobility (R26.89);Muscle weakness (generalized) (M62.81);History of falling (Z91.81);Pain Pain - part of body: (pelvis)                Time: [id number] OT Time Calculation (min): 40 min Charges:  OT General Charges $OT Visit: 1 Visit OT Evaluation $OT Eval Moderate Complexity: 1 Mod  Delbert Phenix OT Pager: [phone]  Rosemary Holms 03/23/2020, 2:53 PM

## 2020-03-24 ENCOUNTER — Encounter: Payer: Self-pay | Admitting: Orthopedic Surgery

## 2020-03-24 DIAGNOSIS — R296 Repeated falls: Secondary | ICD-10-CM | POA: Diagnosis not present

## 2020-03-24 LAB — URINE CULTURE: Culture: <10000 — AB

## 2020-03-24 MED ORDER — METHOCARBAMOL 500 MG PO TABS
500.0000 mg | ORAL_TABLET | Freq: Three times a day (TID) | ORAL | Status: DC | PRN
  Administered 2020-03-25 (×2): 500 mg via ORAL
  Filled 2020-03-24 (×2): qty 1

## 2020-03-24 MED ORDER — HYDROCODONE-ACETAMINOPHEN 5-325 MG PO TABS
1.0000 | ORAL_TABLET | ORAL | Status: DC | PRN
  Administered 2020-03-24 – 2020-03-26 (×9): 1 via ORAL
  Filled 2020-03-24 (×9): qty 1

## 2020-03-24 NOTE — TOC Progression Note (Signed)
Transition of Care [facility]) - Progression Note    Patient Details  Name: [name] MRN: [id number] Date of Birth: Feb 20, 1932  Transition of Care [facility]) CM/SW Contact  Lennart Pall, LCSW Phone Number: 03/24/2020, 10:58 AM  Clinical Narrative:   Have received a SNF bed offer from 3 facilities and pt has accepted at [facility] who can admit pt tomorrow.  Pt's daughter, [name], on the phone and aware of plans.  Will alert MD/ RN.    Expected Discharge Plan: [place] Barriers to Discharge: Continued Medical Work up  Expected Discharge Plan and Services Expected Discharge Plan: [place] In-house Referral: Clinical Social Work     Living arrangements for the past 2 months: Single Family Home                 DME Arranged: N/A DME Agency: NA                   Social Determinants of Health (SDOH) Interventions    Readmission Risk Interventions Readmission Risk Prevention Plan 03/23/2020  Post Dischage Appt Complete  Medication Screening Complete  Transportation Screening Complete  Some recent data might be hidden

## 2020-03-24 NOTE — Progress Notes (Signed)
PROGRESS NOTE    [name]  [id number] DOB: Apr 08, 1932 DOA: 03/22/2020 PCP: [name], MD  Brief Narrative:86 year old female with history of primary progressive multiple sclerosis, rheumatoid arthritis who has been falling frequently over the last 7 to 10 days, patient reports that her legs have been buckling, denies any unilateral symptoms she did see Dr. Sharol Given recently after one of her falls and had unremarkable imaging studies then. -4/14 morning she fell in the bathroom onto her right hip and had severe pain and limited mobility, and subsequently brought to the ED   Assessment & Plan:   Right hip pain, fall Acetabular and right ischial fracture noted on MRI today -Called and discussed with orthopedics Dr. Sharol Given, recommended nonoperative management, consideration of rehab and weightbearing as tolerated -PT OT eval completed, SNF recommended for rehab -Social work consulted -Pain control-continue hydrocodone, add Robaxin -Add laxatives  History of primary progressive multiple sclerosis -With recent frequent falls for the last 7 to 10 days -MRI LS spine with contrast without evidence of active MS in lumbar spine  History of RA -Continue prednisone 3 mg daily -Also takes methotrexate weekly  DVT prophylaxis: Lovenox Code Status: Full code Family Communication: No family at bedside, called and updated daughter from patient's room Disposition Plan:  Dispo: The patient is from: Home              Anticipated d/c is to: SNF              Anticipated d/c date is:1-2days     Consultants:  Discussed with orthopedics Dr. Sharol Given  Procedures:   Antimicrobials:    Subjective: -Complains of pain and spasms in her right hip and thigh Objective: Vitals:   03/23/20 0539 03/23/20 1324 03/23/20 2047 03/24/20 0459  BP: (!) 145/66 (!) 159/65 (!) 117/56 (!) 169/69  Pulse: (!) 59 64 63 65  Resp: 18 18 15 16   Temp: 97.7 F (36.5 C) 97.9 F (36.6 C) 98.5 F (36.9 C)  98.1 F (36.7 C)  TempSrc:      SpO2: 96% 100% 97% 99%  Weight:      Height:        Intake/Output Summary (Last 24 hours) at 03/24/2020 1219 Last data filed at 03/24/2020 [id number] Gross per 24 hour  Intake 360 ml  Output 1800 ml  Net -1440 ml   Filed Weights   03/23/20 0500  Weight: 61.4 kg    Examination:  General exam: Elderly frail pleasant female sitting up in the recliner, AAO x3 Respiratory system: Clear to auscultation. Cardiovascular system: S1 & S2 heard, RRR Gastrointestinal system: Abdomen is nondistended, soft and nontender.Normal bowel sounds heard. Extremities: Trace edema bilaterally, synovitis involving joints of both hands Skin: Venous stasis changes Psychiatry: Judgement and insight appear normal. Mood & affect appropriate.     Data Reviewed:   CBC: Recent Labs  Lab 03/22/20 2229  WBC 6.8  NEUTROABS 2.7  HGB 13.5  HCT 40.0  MCV 98.5  PLT [id number]   Basic Metabolic Panel: Recent Labs  Lab 03/22/20 2229 03/23/20 0805  NA 134* 137  K 4.0 3.9  CL 100 104  CO2 25 25  GLUCOSE 108* 100*  BUN 26* 20  CREATININE 0.63 0.63  CALCIUM 9.7 8.8*   GFR: Estimated Creatinine Clearance: 49.8 mL/min (by C-G formula based on SCr of 0.63 mg/dL). Liver Function Tests: Recent Labs  Lab 03/23/20 0805  AST 50*  ALT 33  ALKPHOS 56  BILITOT 1.0  PROT 5.4*  ALBUMIN  3.7   No results for input(s): LIPASE, AMYLASE in the last 168 hours. No results for input(s): AMMONIA in the last 168 hours. Coagulation Profile: No results for input(s): INR, PROTIME in the last 168 hours. Cardiac Enzymes: Recent Labs  Lab 03/22/20 2229  CKTOTAL 455*   BNP (last 3 results) No results for input(s): PROBNP in the last 8760 hours. HbA1C: No results for input(s): HGBA1C in the last 72 hours. CBG: No results for input(s): GLUCAP in the last 168 hours. Lipid Profile: No results for input(s): CHOL, HDL, LDLCALC, TRIG, CHOLHDL, LDLDIRECT in the last 72 hours. Thyroid Function  Tests: Recent Labs    03/23/20 0415  TSH 4.716*   Anemia Panel: No results for input(s): VITAMINB12, FOLATE, FERRITIN, TIBC, IRON, RETICCTPCT in the last 72 hours. Urine analysis:    Component Value Date/Time   COLORURINE STRAW (A) 03/23/2020 0201   APPEARANCEUR CLEAR 03/23/2020 0201   LABSPEC 1.006 03/23/2020 0201   PHURINE 7.0 03/23/2020 0201   GLUCOSEU NEGATIVE 03/23/2020 0201   HGBUR NEGATIVE 03/23/2020 0201   BILIRUBINUR NEGATIVE 03/23/2020 0201   KETONESUR 5 (A) 03/23/2020 0201   PROTEINUR NEGATIVE 03/23/2020 0201   NITRITE NEGATIVE 03/23/2020 0201   LEUKOCYTESUR NEGATIVE 03/23/2020 0201   Sepsis Labs: @LABRCNTIP (procalcitonin:4,lacticidven:4)  ) Recent Results (from the past 240 hour(s))  Urine culture     Status: Abnormal   Collection Time: 03/23/20  2:01 AM   Specimen: Urine  Result Value Ref Range Status   Specimen Description   Final    Urine Performed at [facility], [place] [address], [place], [place] [address]    Special Requests   Final    NONE Performed at [facility], [place] [address] [place], [place] [address]    Culture (A)  Final    <10,000 COLONIES/mL INSIGNIFICANT GROWTH Performed at [facility] [address], [place], [place] [address]    Report Status 03/24/2020 FINAL  Final  SARS Coronavirus 2 by RT PCR     Status: None   Collection Time: 03/23/20  4:15 AM  Result Value Ref Range Status   SARS Coronavirus 2 NEGATIVE NEGATIVE Final    Comment: (NOTE) Result indicates the ABSENCE of SARS-CoV-2 RNA in the patient specimen.  The lowest concentration of SARS-CoV-2 viral copies this assay can detect in nasopharyngeal swab specimens is 500 copies / mL.  A negative result does not preclude SARS-CoV-2 infection and should not be used as the sole basis for patient management decisions. A negative result may occur with improper specimen collection / handling, submission of a specimen other than  nasopharyngeal swab, presence of viral mutation(s) within the areas targeted by this assay, and inadequate number of viral copies (<500 copies / mL) present.  Negative results must be combined with clinical observations, patient history, and epidemiological information.  The expected result is NEGATIVE.  Patient Fact Sheet:  BlogSelections.co.uk   Provider Fact Sheet:  https://lucas.com/   This test is not yet approved or cleared by the [place] FDA and  has been authorized for  detection and/or diagnosis of SARS-CoV-2 by FDA under an Emergency Use Authorization (EUA).  This EUA will remain in effect (meaning this test can be used) for the duration of  the COVID-19 declaration under Section 564(b)(1) of the Act, 21 U.S.C. section 360bbb-3(b)(1), unless the authorization is terminated or revoked sooner Performed at [facility], [place] [address], [place], [place] [address]  Radiology Studies: DG Chest 1 View  Result Date: 03/22/2020 CLINICAL DATA:  Multiple falls with right hip pain EXAM: CHEST  1 VIEW COMPARISON:  12/08/2018 FINDINGS: Mild cardiomegaly.  No consolidation or effusion.  No pneumothorax. IMPRESSION: No active disease.  Mild cardiomegaly. Electronically Signed   By: Donavan Foil M.D.   On: 03/22/2020 23:03   MR Lumbar Spine W Wo Contrast  Result Date: 03/23/2020 CLINICAL DATA:  Multiple sclerosis with bilateral leg weakness. Frequent falls. Severe back pain. EXAM: MRI LUMBAR SPINE WITHOUT AND WITH CONTRAST TECHNIQUE: Multiplanar and multiecho pulse sequences of the lumbar spine were obtained without and with intravenous contrast. CONTRAST:  13mL GADAVIST GADOBUTROL 1 MMOL/ML IV SOLN COMPARISON:  None FINDINGS: Segmentation:  Standard. Alignment:  Physiologic. Vertebrae:  No fracture, evidence of discitis, or bone lesion. Conus medullaris and cauda equina: Conus extends to the L1 level. Conus and cauda equina  appear normal. Paraspinal and other soft tissues: Negative Disc levels: L1-L2: Normal disc space and facet joints. There is no spinal canal stenosis. No neural foraminal stenosis. L2-L3: Moderate facet hypertrophy with mild diffuse disc bulge. There is no spinal canal stenosis. No neural foraminal stenosis. L3-L4: Mild disc bulge. There is no spinal canal stenosis. No neural foraminal stenosis. L4-L5: Moderate facet hypertrophy and mild disc bulge. Sclerotic focus in the L4 vertebral body. There is no spinal canal stenosis. No neural foraminal stenosis. L5-S1: Normal disc space and facet joints. There is no spinal canal stenosis. No neural foraminal stenosis. Visualized sacrum: Normal. IMPRESSION: 1. No acute abnormality of the lumbar spine. 2. Mild multilevel degenerative disc disease without spinal canal or neural foraminal stenosis. 3. Moderate facet arthrosis at L2-3 and L4-5. Electronically Signed   By: Ulyses Jarred M.D.   On: 03/23/2020 20:57   MR HIP RIGHT WO CONTRAST  Result Date: 03/23/2020 CLINICAL DATA:  Right hip pain after fall 7 doses. Inability to ambulate EXAM: MR OF THE RIGHT HIP WITHOUT CONTRAST TECHNIQUE: Multiplanar, multisequence MR imaging was performed. No intravenous contrast was administered. COMPARISON:  X-ray 03/22/2020 FINDINGS: Technical note: Motion degraded exam. Bones: Acute fracture of the central portion of the acetabular roof with 4 mm of articular-surface diastasis (series 7, image 9; series 9, images 10-12). There is an additional nondisplaced fracture involving the posterior acetabulum (series 7, images 9-12). Marrow edema anteriorly at the level of the AIIS. Nondisplaced fracture of the right ischium extending to the ischial tuberosity (series 4 and 5, image 5). Proximal right femur is intact without fracture or dislocation. No femoral head AVN. No additional fractures. Pubic symphysis and SI joints intact without diastasis. Articular cartilage and labrum Articular  cartilage: Mild-to-moderate chondral thinning and surface irregularity. Labrum: Degeneration and tearing of the superior and anterosuperior labrum. Joint or bursal effusion Joint effusion:  Small-moderate joint effusion. Bursae: No abnormal bursal fluid collection. Muscles and tendons Muscles and tendons: Intramuscular and soft tissue edema surrounding the right hip and hemipelvis most pronounced within the right iliacus, right gluteus minimus, and right rectus femoris muscles. Tendons are intact without tear. Other findings Miscellaneous:   None. IMPRESSION: 1. Acute fracture of the central portion of the acetabular roof with 4 mm of articular-surface diastasis. 2. Acute nondisplaced fracture of the posterior acetabulum. 3. Acute nondisplaced fracture of the right ischium extending to the ischial tuberosity. 4. Right proximal femur intact without fracture or dislocation. 5. Small to moderate right hip joint effusion, likely reactive. These results will be called to the ordering clinician or representative by the Radiologist  Environmental consultant, and communication documented in the PACS or [employer]. Electronically Signed   By: Davina Poke D.O.   On: 03/23/2020 08:03   DG Hip Unilat W or Wo Pelvis 2-3 Views Right  Result Date: 03/22/2020 CLINICAL DATA:  Multiple fall EXAM: DG HIP (WITH OR WITHOUT PELVIS) 2-3V RIGHT COMPARISON:  03/20/2020 FINDINGS: SI joints are non widened. Pubic symphysis is intact. Old left superior and inferior pubic rami fractures. Bones appear osteopenic. No acute displaced fracture or malalignment. IMPRESSION: No acute osseous abnormality. CT or MRI follow-up may be pursued if continued concern for hip fracture. Old left pubic rami fractures. Electronically Signed   By: Donavan Foil M.D.   On: 03/22/2020 23:03        Scheduled Meds: . enoxaparin (LOVENOX) injection  40 mg Subcutaneous Daily  . folic acid  2 mg Oral Daily  . levothyroxine  25 mcg Oral Daily  . methotrexate  20  mg Oral Weekly  . polyethylene glycol  17 g Oral Daily  . predniSONE  3 mg Oral Daily  . senna-docusate  1 tablet Oral BID   Continuous Infusions:   LOS: 1 day    Time spent:86min  Domenic Polite, MD [facility]  03/24/2020, 12:19 PM

## 2020-03-24 NOTE — Telephone Encounter (Signed)
Dr. Sharol Given has already spoken with hospital will be wtbat and d/c to a snf for rehab.

## 2020-03-24 NOTE — Progress Notes (Signed)
Physical Therapy Treatment Patient Details Name: [name] MRN: [id number] DOB: 12-07-1932 Today's Date: 03/24/2020    History of Present Illness 86 year old female with history of primary progressive multiple sclerosis, rheumatoid arthritis who has been falling frequently over the last 7 to 10 days, patient reports that her legs have been buckling, denies any unilateral symptoms she did see Dr. Sharol Given recently after one of her falls and had unremarkable imaging studies then.  Pt presented to ED after another fall with right hip pain.  Acetabular and right ischial fracture noted on MRI.  Plan for nonsurgical management and WBAT.    PT Comments    Pt assisted with standing and taking a few steps over to recliner.  Pt able to assist more with weight shifting and steps today however continues to report increased pain with mobility.  Pt did report feeling less pain and more comfortable in recliner after transfer.  Pt plans to d/c to SNF.    Follow Up Recommendations  SNF     Equipment Recommendations  Other (comment)(if home, narrow w/c with cushion)    Recommendations for Other Services       Precautions / Restrictions Precautions Precautions: Fall Restrictions Weight Bearing Restrictions: No    Mobility  Bed Mobility Overal bed mobility: Needs Assistance Bed Mobility: Rolling;Sidelying to Sit Rolling: Min guard Sidelying to sit: +2 for safety/equipment;Min assist       General bed mobility comments: cues for technique, assist for trunk upright, pt requiring increased time due to pain and "spasms" of R LE "from [place]" (R LE draws into flexion pattern)  Transfers Overall transfer level: Needs assistance Equipment used: Rolling walker (2 wheeled) Transfers: Sit to/from [facility] Sit to Stand: Mod assist;+2 physical assistance Stand pivot transfers: Min assist;+2 safety/equipment       General transfer comment: verbal cues for UE and LE  positioning, pt pulls up on RW, required assist to rise, pt able to weight shift today however limited by R LE "spasms" with R LE going into flexion and foot leaving floor, required increased time  Ambulation/Gait                 Stairs             Wheelchair Mobility    Modified Rankin (Stroke Patients Only)       Balance                                            Cognition Arousal/Alertness: Awake/alert Behavior During Therapy: WFL for tasks assessed/performed Overall Cognitive Status: Within Functional Limits for tasks assessed                                        Exercises      General Comments        Pertinent Vitals/Pain Pain Assessment: Faces Faces Pain Scale: Hurts even more Pain Location: R pelvis Pain Descriptors / Indicators: Grimacing;Guarding;Spasm;Sharp Pain Intervention(s): Repositioned;Monitored during session;Patient requesting pain meds-RN notified    Home Living                      Prior Function            PT Goals (current goals can now be found in the care  plan section) Progress towards PT goals: Progressing toward goals    Frequency    Min 3X/week      PT Plan Current plan remains appropriate    Co-evaluation              AM-PAC PT "6 Clicks" Mobility   Outcome Measure  Help needed turning from your back to your side while in a flat bed without using bedrails?: A Little Help needed moving from lying on your back to sitting on the side of a flat bed without using bedrails?: A Little Help needed moving to and from a bed to a chair (including a wheelchair)?: A Lot Help needed standing up from a chair using your arms (e.g., wheelchair or bedside chair)?: A Lot Help needed to walk in hospital room?: A Lot Help needed climbing 3-5 steps with a railing? : Total 6 Click Score: 13    End of Session Equipment Utilized During Treatment: Gait belt Activity Tolerance:  Patient tolerated treatment well Patient left: in chair;with call bell/phone within reach;with chair alarm set Nurse Communication: Mobility status PT Visit Diagnosis: Unsteadiness on feet (R26.81);Difficulty in walking, not elsewhere classified (R26.2)     Time: [id number] PT Time Calculation (min) (ACUTE ONLY): 18 min  Charges:  $Therapeutic Activity: 8-22 mins                     Arlyce Dice, DPT Acute Rehabilitation Services Office: [phone]   Carlas Vandyne,KATHrine E 03/24/2020, 3:09 PM

## 2020-03-24 NOTE — Progress Notes (Signed)
Office Visit Note   Patient: [name]           Date of Birth: Jun 24, 1932           MRN: [id number] Visit Date: 03/20/2020              Requested by: [name], MD [address] [place],  [place] [address] PCP: [name], MD  Chief Complaint  Patient presents with  . Right Hip - Pain      HPI: Patient is an 86 year old woman who states that she has had increasing pain in the right hip.  She states that she has had several falls even after the CT scan.  Patient states she is currently taking Tylenol for pain.  Assessment & Plan: Visit Diagnoses:  1. Pain in right hip     Plan: We will have patient evaluated by home health physical therapy to help with strength and gait training.  Discussed that she could take up to 3000 mg of Tylenol a day without alcohol.  Follow-Up Instructions: No follow-ups on file.   Ortho Exam  Patient is alert, oriented, no adenopathy, well-dressed, normal affect, normal respiratory effort. Examination patient has no pain with passive range of motion of the right hip knee or ankle.  No clinical signs of a stress fracture.  Review of the CT scan shows no evidence of femoral neck for acetabular fracture.  No pubic rami fracture.  Imaging: MR Lumbar Spine W Wo Contrast  Result Date: 03/23/2020 CLINICAL DATA:  Multiple sclerosis with bilateral leg weakness. Frequent falls. Severe back pain. EXAM: MRI LUMBAR SPINE WITHOUT AND WITH CONTRAST TECHNIQUE: Multiplanar and multiecho pulse sequences of the lumbar spine were obtained without and with intravenous contrast. CONTRAST:  12mL GADAVIST GADOBUTROL 1 MMOL/ML IV SOLN COMPARISON:  None FINDINGS: Segmentation:  Standard. Alignment:  Physiologic. Vertebrae:  No fracture, evidence of discitis, or bone lesion. Conus medullaris and cauda equina: Conus extends to the L1 level. Conus and cauda equina appear normal. Paraspinal and other soft tissues: Negative Disc levels: L1-L2:  Normal disc space and facet joints. There is no spinal canal stenosis. No neural foraminal stenosis. L2-L3: Moderate facet hypertrophy with mild diffuse disc bulge. There is no spinal canal stenosis. No neural foraminal stenosis. L3-L4: Mild disc bulge. There is no spinal canal stenosis. No neural foraminal stenosis. L4-L5: Moderate facet hypertrophy and mild disc bulge. Sclerotic focus in the L4 vertebral body. There is no spinal canal stenosis. No neural foraminal stenosis. L5-S1: Normal disc space and facet joints. There is no spinal canal stenosis. No neural foraminal stenosis. Visualized sacrum: Normal. IMPRESSION: 1. No acute abnormality of the lumbar spine. 2. Mild multilevel degenerative disc disease without spinal canal or neural foraminal stenosis. 3. Moderate facet arthrosis at L2-3 and L4-5. Electronically Signed   By: Ulyses Jarred M.D.   On: 03/23/2020 20:57   No images are attached to the encounter.  Labs: Lab Results  Component Value Date   ESRSEDRATE 108 (H) 04/03/2010   REPTSTATUS 03/24/2020 FINAL 03/23/2020   CULT (A) 03/23/2020    <10,000 COLONIES/mL INSIGNIFICANT GROWTH Performed at [facility] [address] [place], [place] [address]      Lab Results  Component Value Date   ALBUMIN 3.7 03/23/2020   ALBUMIN 4.0 02/23/2019   ALBUMIN 3.9 12/26/2016    No results found for: MG No results found for: VD25OH  No results found for: PREALBUMIN CBC EXTENDED Latest Ref Rng &  Units 03/22/2020 02/23/2019 12/26/2016  WBC 4.0 - 10.5 K/uL 6.8 11.0(H) 5.7  RBC 3.87 - 5.11 MIL/uL 4.06 4.76 4.39  HGB 12.0 - 15.0 g/dL 13.5 13.8 13.2  HCT 36.0 - 46.0 % 40.0 43.2 37.5  PLT 150 - 400 K/uL 150 260 162  NEUTROABS 1.7 - 7.7 K/uL 2.7 8.6(H) 4.0  LYMPHSABS 0.7 - 4.0 K/uL 3.4 1.7 1.0     Body mass index is 21.14 kg/m.  Orders:  Orders Placed This Encounter  Procedures  . XR HIP UNILAT W OR W/O PELVIS 2-3 VIEWS RIGHT   No orders of the defined types were placed in this  encounter.    Procedures: No procedures performed  Clinical Data: No additional findings.  ROS:  All other systems negative, except as noted in the HPI. Review of Systems  Objective: Vital Signs: Ht 5\' 7"  (1.702 m)   Wt 135 lb (61.2 kg)   LMP 09/08/1970 (Approximate)   BMI 21.14 kg/m   Specialty Comments:  No specialty comments available.  PMFS History: Patient Active Problem List   Diagnosis Date Noted  . Frequent falls 03/23/2020  . RA (rheumatoid arthritis) ([place]) 03/23/2020  . Tympanic membrane central perforation 12/15/2019  . Cholesteatoma of external auditory canal, right 09/10/2019  . Hearing loss 09/10/2019  . Chronic venous insufficiency of lower extremity 01/25/2019  . Grade II diastolic dysfunction [id number]  . Urinary dysfunction 12/22/2017  . Multiple sclerosis, primary progressive ([place]) 06/19/2017  . Left foot drop 03/17/2017  . Primary chronic progressive multiple sclerosis ([place]) 03/17/2017  . Myelopathy ([place]) 03/17/2017  . Bilateral lower extremity edema 03/17/2017   Past Medical History:  Diagnosis Date  . Chronic venous insufficiency of lower extremity 01/25/2019   With chronic bilateral lower extremity edema  . Incontinent of urine   . Multiple sclerosis ([place]) 2015  . Osteoporosis    osteopenia  . Polyarthritis   . Post-menopausal   . Rheumatoid arthritis ([place])   . Temporal arteritis ([place]) 2011    Family History  Problem Relation Age of Onset  . Osteoarthritis Father   . Stroke Father   . Heart failure Mother   . Hypertension Mother   . Heart disease Mother   . Multiple sclerosis Cousin   . Multiple sclerosis Cousin   . Multiple sclerosis Cousin     Past Surgical History:  Procedure Laterality Date  . ABDOMINAL HYSTERECTOMY  1971   TVH  . breast ductectomy Right 05/1995  . COLONOSCOPY  10/2011   Social History   Occupational History  . Not on file  Tobacco Use  . Smoking status: Never Smoker  . Smokeless tobacco: Never  Used  Substance and Sexual Activity  . Alcohol use: No  . Drug use: No  . Sexual activity: Not Currently    Partners: Male    Birth control/protection: Post-menopausal, Surgical    Comment: hysterectomy

## 2020-03-24 NOTE — NC FL2 (Signed)
[place] LEVEL OF CARE SCREENING TOOL     IDENTIFICATION  Patient Name: [name] Birthdate: 12-16-1931 Sex: female Admission Date (Current Location): 03/22/2020  [facility] and [place] Number:  Herbalist and Address:  [facility],  [place], [place]      Provider Number: [id number]  Attending Physician Name and Address:  Domenic Polite, MD  Relative Name and Phone Number:  daughter, Edward Jolly @ [phone]    Current Level of Care: SNF Recommended Level of Care: [place] Prior Approval Number:    Date Approved/Denied:   PASRR Number: [id number]  Discharge Plan: SNF    Current Diagnoses: Patient Active Problem List   Diagnosis Date Noted  . Frequent falls 03/23/2020  . RA (rheumatoid arthritis) ([place]) 03/23/2020  . Tympanic membrane central perforation 12/15/2019  . Cholesteatoma of external auditory canal, right 09/10/2019  . Hearing loss 09/10/2019  . Chronic venous insufficiency of lower extremity 01/25/2019  . Grade II diastolic dysfunction [id number]  . Urinary dysfunction 12/22/2017  . Multiple sclerosis, primary progressive ([place]) 06/19/2017  . Left foot drop 03/17/2017  . Primary chronic progressive multiple sclerosis ([place]) 03/17/2017  . Myelopathy ([place]) 03/17/2017  . Bilateral lower extremity edema 03/17/2017    Orientation RESPIRATION BLADDER Height & Weight     Self, Time, Situation, Place  Normal External catheter Weight: 135 lb 5.8 oz (61.4 kg) Height:  5\' 7"  (170.2 cm)  BEHAVIORAL SYMPTOMS/MOOD NEUROLOGICAL BOWEL NUTRITION STATUS      Continent Diet(regular)  AMBULATORY STATUS COMMUNICATION OF NEEDS Skin   Extensive Assist Verbally Normal                       Personal Care Assistance Level of Assistance  Bathing, Dressing Bathing Assistance: Limited assistance   Dressing Assistance: Limited assistance     Functional Limitations Info              SPECIAL CARE FACTORS FREQUENCY  PT (By licensed PT), OT (By licensed OT)     PT Frequency: 5x/wk OT Frequency: 5x/wk            Contractures      Additional Factors Info  Code Status, Allergies Code Status Info: Full Allergies Info: see MAR           Current Medications (03/24/2020):  This is the current hospital active medication list Current Facility-Administered Medications  Medication Dose Route Frequency Provider Last Rate Last Admin  . acetaminophen (TYLENOL) tablet 650 mg  650 mg Oral Q6H PRN Etta Quill, DO       Or  . acetaminophen (TYLENOL) suppository 650 mg  650 mg Rectal Q6H PRN Etta Quill, DO      . enoxaparin (LOVENOX) injection 40 mg  40 mg Subcutaneous Daily Jennette Kettle M, DO   40 mg at 03/23/20 1039  . folic acid (FOLVITE) tablet 2 mg  2 mg Oral Daily Jennette Kettle M, DO   2 mg at 03/23/20 1039  . HYDROcodone-acetaminophen (NORCO/VICODIN) 5-325 MG per tablet 1 tablet  1 tablet Oral Q6H PRN Etta Quill, DO   1 tablet at 03/24/20 0449  . levothyroxine (SYNTHROID) tablet 25 mcg  25 mcg Oral Daily Etta Quill, DO   25 mcg at 03/24/20 0519  . methotrexate (RHEUMATREX) tablet 20 mg  20 mg Oral Weekly Domenic Polite, MD   20 mg at 03/23/20 1335  . ondansetron (ZOFRAN) tablet 4 mg  4 mg Oral Q6H PRN Etta Quill, DO       Or  . ondansetron [facility]) injection 4 mg  4 mg Intravenous Q6H PRN Etta Quill, DO      . polyethylene glycol (MIRALAX / GLYCOLAX) packet 17 g  17 g Oral Daily Domenic Polite, MD      . predniSONE (DELTASONE) tablet 3 mg  3 mg Oral Daily Jennette Kettle M, DO   3 mg at 03/23/20 1039  . senna-docusate (Senokot-S) tablet 1 tablet  1 tablet Oral BID Domenic Polite, MD   1 tablet at 03/23/20 2112     Discharge Medications: Please see discharge summary for a list of discharge medications.  Relevant Imaging Results:  Relevant Lab Results:   Additional Information SS# [id number]  Lennart Pall,  LCSW

## 2020-03-25 DIAGNOSIS — R296 Repeated falls: Secondary | ICD-10-CM | POA: Diagnosis not present

## 2020-03-25 LAB — SARS CORONAVIRUS 2 (TAT 6-24 HRS): SARS Coronavirus 2: NEGATIVE

## 2020-03-25 MED ORDER — HYDRALAZINE HCL 20 MG/ML IJ SOLN
10.0000 mg | Freq: Once | INTRAMUSCULAR | Status: AC
  Administered 2020-03-25: 10 mg via INTRAVENOUS
  Filled 2020-03-25: qty 1

## 2020-03-25 MED ORDER — AMLODIPINE BESYLATE 10 MG PO TABS: 10.0000 mg | ORAL_TABLET | Freq: Every day | ORAL | Status: DC

## 2020-03-25 MED ORDER — LISINOPRIL 10 MG PO TABS
10.0000 mg | ORAL_TABLET | Freq: Every day | ORAL | Status: DC
  Administered 2020-03-25 – 2020-03-26 (×2): 10 mg via ORAL
  Filled 2020-03-25 (×2): qty 1

## 2020-03-25 NOTE — Plan of Care (Signed)

## 2020-03-25 NOTE — Progress Notes (Addendum)
PROGRESS NOTE    [name]  [id number] DOB: [birth year] DOA: 03/22/2020 PCP: [name], MD  Brief Narrative:86 year old female with history of primary progressive multiple sclerosis, rheumatoid arthritis who has been falling frequently over the last 7 to 10 days, patient reports that her legs have been buckling, denies any unilateral symptoms she did see Dr. Sharol Given recently after one of her falls and had unremarkable imaging studies then. -4/14 morning she fell in the bathroom onto her right hip and had severe pain and limited mobility, and subsequently brought to the ED   Assessment & Plan:   Right hip pain, fall Acetabular and right ischial fracture noted on MRI  -Called and discussed with orthopedics Dr. Sharol Given, recommended nonoperative management, consideration of rehab and weightbearing as tolerated -PT OT eval completed, SNF recommended for rehab -Social work consulted -Pain control-continue hydrocodone, add Robaxin -Continue laxatives  History of primary progressive multiple sclerosis -With recent frequent falls for the last 7 to 10 days -MRI LS spine with contrast without evidence of active MS in lumbar spine -Continue home regimen  History of RA -Continue prednisone 3 mg daily -Also takes methotrexate weekly -Continue Sarilumab  DVT prophylaxis: Lovenox Code Status: DNR Family Communication: No family at bedside, called and updated daughter from patient's room Disposition Plan:  Dispo: The patient is from: Home              Anticipated d/c is to: SNF              Anticipated d/c date is:1-2days     Consultants:  Discussed with orthopedics Dr. Sharol Given  Procedures:   Antimicrobials:    Subjective: -Continues to have mild pain and discomfort with any mobility of her right Objective: Vitals:   03/24/20 0459 03/24/20 1422 03/24/20 2039 03/25/20 0530  BP: (!) 169/69 (!) 146/68 (!) 166/64 (!) 184/77  Pulse: 65 (!) 55 (!) 59 66  Resp: 16 16 16 16     Temp: 98.1 F (36.7 C) (!) 97.4 F (36.3 C) 98.3 F (36.8 C) 98.2 F (36.8 C)  TempSrc:  Oral Oral Oral  SpO2: 99% 99% 96% 97%  Weight:      Height:        Intake/Output Summary (Last 24 hours) at 03/25/2020 1232 Last data filed at 03/25/2020 1100 Gross per 24 hour  Intake 480 ml  Output 450 ml  Net 30 ml   Filed Weights   03/23/20 0500  Weight: 61.4 kg    Examination:  Gen: Elderly pleasant female sitting up in the recliner, AAO x3, no distress HEENT: no JVD Lungs: Clear CVS: S1-S2, Abd: soft, Non tender, non distended, BS present Extremities: Trace edema, venous stasis changes, synovitis involving joints of both Skin:  Venous stasis changes Psychiatry: Judgement and insight appear normal. Mood & affect appropriate.     Data Reviewed:   CBC: Recent Labs  Lab 03/22/20 2229  WBC 6.8  NEUTROABS 2.7  HGB 13.5  HCT 40.0  MCV 98.5  PLT [id number]   Basic Metabolic Panel: Recent Labs  Lab 03/22/20 2229 03/23/20 0805  NA 134* 137  K 4.0 3.9  CL 100 104  CO2 25 25  GLUCOSE 108* 100*  BUN 26* 20  CREATININE 0.63 0.63  CALCIUM 9.7 8.8*   GFR: Estimated Creatinine Clearance: 49.8 mL/min (by C-G formula based on SCr of 0.63 mg/dL). Liver Function Tests: Recent Labs  Lab 03/23/20 0805  AST 50*  ALT 33  ALKPHOS 56  BILITOT 1.0  PROT  5.4*  ALBUMIN 3.7   No results for input(s): LIPASE, AMYLASE in the last 168 hours. No results for input(s): AMMONIA in the last 168 hours. Coagulation Profile: No results for input(s): INR, PROTIME in the last 168 hours. Cardiac Enzymes: Recent Labs  Lab 03/22/20 2229  CKTOTAL 455*   BNP (last 3 results) No results for input(s): PROBNP in the last 8760 hours. HbA1C: No results for input(s): HGBA1C in the last 72 hours. CBG: No results for input(s): GLUCAP in the last 168 hours. Lipid Profile: No results for input(s): CHOL, HDL, LDLCALC, TRIG, CHOLHDL, LDLDIRECT in the last 72 hours. Thyroid Function Tests: Recent  Labs    03/23/20 0415  TSH 4.716*   Anemia Panel: No results for input(s): VITAMINB12, FOLATE, FERRITIN, TIBC, IRON, RETICCTPCT in the last 72 hours. Urine analysis:    Component Value Date/Time   COLORURINE STRAW (A) 03/23/2020 0201   APPEARANCEUR CLEAR 03/23/2020 0201   LABSPEC 1.006 03/23/2020 0201   PHURINE 7.0 03/23/2020 0201   GLUCOSEU NEGATIVE 03/23/2020 0201   HGBUR NEGATIVE 03/23/2020 0201   BILIRUBINUR NEGATIVE 03/23/2020 0201   KETONESUR 5 (A) 03/23/2020 0201   PROTEINUR NEGATIVE 03/23/2020 0201   NITRITE NEGATIVE 03/23/2020 0201   LEUKOCYTESUR NEGATIVE 03/23/2020 0201   Sepsis Labs: @LABRCNTIP (procalcitonin:4,lacticidven:4)  ) Recent Results (from the past 240 hour(s))  Urine culture     Status: Abnormal   Collection Time: 03/23/20  2:01 AM   Specimen: Urine  Result Value Ref Range Status   Specimen Description   Final    Urine Performed at [facility], [place] [address] [place], [place] [address]    Special Requests   Final    NONE Performed at [facility], [place] [address] [place], [place] [address]    Culture (A)  Final    <10,000 COLONIES/mL INSIGNIFICANT GROWTH Performed at [place] [address], [place], [place] [address]    Report Status 03/24/2020 FINAL  Final  SARS Coronavirus 2 by RT PCR     Status: None   Collection Time: 03/23/20  4:15 AM  Result Value Ref Range Status   SARS Coronavirus 2 NEGATIVE NEGATIVE Final    Comment: (NOTE) Result indicates the ABSENCE of SARS-CoV-2 RNA in the patient specimen.  The lowest concentration of SARS-CoV-2 viral copies this assay can detect in nasopharyngeal swab specimens is 500 copies / mL.  A negative result does not preclude SARS-CoV-2 infection and should not be used as the sole basis for patient management decisions. A negative result may occur with improper specimen collection / handling, submission of a specimen other than nasopharyngeal  swab, presence of viral mutation(s) within the areas targeted by this assay, and inadequate number of viral copies (<500 copies / mL) present.  Negative results must be combined with clinical observations, patient history, and epidemiological information.  The expected result is NEGATIVE.  Patient Fact Sheet:  BlogSelections.co.uk   Provider Fact Sheet:  https://lucas.com/   This test is not yet approved or cleared by the [place] FDA and  has been authorized for  detection and/or diagnosis of SARS-CoV-2 by FDA under an Emergency Use Authorization (EUA).  This EUA will remain in effect (meaning this test can be used) for the duration of  the COVID-19 declaration under Section 564(b)(1) of the Act, 21 U.S.C. section 360bbb-3(b)(1), unless the authorization is terminated or revoked sooner Performed at [facility], [place] [address] [place], [place] [address]  Radiology Studies: MR Lumbar Spine W Wo Contrast  Result Date: 03/23/2020 CLINICAL DATA:  Multiple sclerosis with bilateral leg weakness. Frequent falls. Severe back pain. EXAM: MRI LUMBAR SPINE WITHOUT AND WITH CONTRAST TECHNIQUE: Multiplanar and multiecho pulse sequences of the lumbar spine were obtained without and with intravenous contrast. CONTRAST:  42mL GADAVIST GADOBUTROL 1 MMOL/ML IV SOLN COMPARISON:  None FINDINGS: Segmentation:  Standard. Alignment:  Physiologic. Vertebrae:  No fracture, evidence of discitis, or bone lesion. Conus medullaris and cauda equina: Conus extends to the L1 level. Conus and cauda equina appear normal. Paraspinal and other soft tissues: Negative Disc levels: L1-L2: Normal disc space and facet joints. There is no spinal canal stenosis. No neural foraminal stenosis. L2-L3: Moderate facet hypertrophy with mild diffuse disc bulge. There is no spinal canal stenosis. No neural foraminal stenosis. L3-L4: Mild disc bulge. There is no spinal canal  stenosis. No neural foraminal stenosis. L4-L5: Moderate facet hypertrophy and mild disc bulge. Sclerotic focus in the L4 vertebral body. There is no spinal canal stenosis. No neural foraminal stenosis. L5-S1: Normal disc space and facet joints. There is no spinal canal stenosis. No neural foraminal stenosis. Visualized sacrum: Normal. IMPRESSION: 1. No acute abnormality of the lumbar spine. 2. Mild multilevel degenerative disc disease without spinal canal or neural foraminal stenosis. 3. Moderate facet arthrosis at L2-3 and L4-5. Electronically Signed   By: Ulyses Jarred M.D.   On: 03/23/2020 20:57        Scheduled Meds: . enoxaparin (LOVENOX) injection  40 mg Subcutaneous Daily  . folic acid  2 mg Oral Daily  . levothyroxine  25 mcg Oral Daily  . lisinopril  10 mg Oral Daily  . methotrexate  20 mg Oral Weekly  . polyethylene glycol  17 g Oral Daily  . predniSONE  3 mg Oral Daily  . senna-docusate  1 tablet Oral BID   Continuous Infusions:   LOS: 2 days    Time spent:58min  Domenic Polite, MD [facility]  03/25/2020, 12:32 PM

## 2020-03-25 NOTE — Progress Notes (Signed)
Physical Therapy Treatment Patient Details Name: [name] MRN: [id number] DOB: 07-01-1932 Today's Date: 03/25/2020    History of Present Illness 86 year old female with history of primary progressive multiple sclerosis, rheumatoid arthritis who has been falling frequently over the last 7 to 10 days, patient reports that her legs have been buckling, denies any unilateral symptoms she did see Dr. Sharol Given recently after one of her falls and had unremarkable imaging studies then.  Pt presented to ED after another fall with right hip pain.  Acetabular and right ischial fracture noted on MRI.  Plan for nonsurgical management and WBAT.    PT Comments    Assisted OOB to [facility] required increased time.  General bed mobility comments: cues for technique, assist for trunk upright, pt requiring increased time due to pain and "spasms" of R LE "from [place]" (R LE draws into flexion pattern) General transfer comment: assisted from elevated bed to [facility] 1/4 pivot turn MaxAssist with difficulty WBing thru R LE and difficulty completing turn.  + 2 side by side assist to stand and assist to perform peri care.  Very brief standing toleance due to c/o "woozy" and fatigue. General Gait Details: attempted gait with + 2 side by side assist however pt was unable to tolerate WBing thru R LE inorder to advance L LE.  Poor standing balance of flex hips and knees.  Increased c/o of nausea and "woozy" so recliner brought to pt. Positioned in recliner to comfort.    Follow Up Recommendations  SNF     Equipment Recommendations       Recommendations for Other Services       Precautions / Restrictions Precautions Precautions: Fall Precaution Comments: Hx MS, bladder incont Restrictions Weight Bearing Restrictions: No Other Position/Activity Restrictions: WBAT    Mobility  Bed Mobility Overal bed mobility: Needs Assistance Bed Mobility: Supine to Sit   Sidelying to sit: +2 for safety/equipment;Min assist        General bed mobility comments: cues for technique, assist for trunk upright, pt requiring increased time due to pain and "spasms" of R LE "from [place]" (R LE draws into flexion pattern)  Transfers Overall transfer level: Needs assistance Equipment used: Rolling walker (2 wheeled);None Transfers: Sit to/from [facility] Sit to Stand: Mod assist;+2 physical assistance Stand pivot transfers: Mod assist;Max assist;+2 physical assistance       General transfer comment: assisted from elevated bed to [facility] 1/4 pivot turn MaxAssist with difficulty WBing thru R LE and difficulty completing turn.  + 2 side by side assist to stand and assist to perform peri care.  Very brief standing toleance due to c/o "woozy" and fatigue.  Ambulation/Gait Ambulation/Gait assistance: Total assist;+2 physical assistance;+2 safety/equipment Gait Distance (Feet): 1 Feet Assistive device: Rolling walker (2 wheeled) Gait Pattern/deviations: Step-to pattern;Decreased stance time - right Gait velocity: decreased   General Gait Details: attempted gait with + 2 side by side assist however pt was unable to tolerate WBing thru R LE inorder to advance L LE.  Poor standing balance of flex hips and knees.  Increased c/o of nausea and "woozy" so recliner brought to pt.   Stairs             Wheelchair Mobility    Modified Rankin (Stroke Patients Only)       Balance  Cognition Arousal/Alertness: Awake/alert Behavior During Therapy: WFL for tasks assessed/performed Overall Cognitive Status: Within Functional Limits for tasks assessed                                 General Comments: pleasant      Exercises      General Comments        Pertinent Vitals/Pain Pain Assessment: Faces Faces Pain Scale: Hurts little more Pain Location: R pelvis Pain Descriptors / Indicators: Grimacing;Guarding;Spasm;Sharp Pain  Intervention(s): Monitored during session;Repositioned    Home Living                      Prior Function            PT Goals (current goals can now be found in the care plan section) Progress towards PT goals: Progressing toward goals    Frequency    Min 3X/week      PT Plan Current plan remains appropriate    Co-evaluation              AM-PAC PT "6 Clicks" Mobility   Outcome Measure  Help needed turning from your back to your side while in a flat bed without using bedrails?: A Lot Help needed moving from lying on your back to sitting on the side of a flat bed without using bedrails?: A Lot Help needed moving to and from a bed to a chair (including a wheelchair)?: A Lot Help needed standing up from a chair using your arms (e.g., wheelchair or bedside chair)?: A Lot Help needed to walk in hospital room?: A Lot Help needed climbing 3-5 steps with a railing? : Total 6 Click Score: 11    End of Session Equipment Utilized During Treatment: Gait belt Activity Tolerance: Patient limited by fatigue Patient left: in chair;with call bell/phone within reach;with chair alarm set Nurse Communication: Mobility status PT Visit Diagnosis: Unsteadiness on feet (R26.81);Difficulty in walking, not elsewhere classified (R26.2)     Time: [id number] PT Time Calculation (min) (ACUTE ONLY): 32 min  Charges:  $Gait Training: 8-22 mins $Therapeutic Activity: 8-22 mins                     Rica Koyanagi  PTA Acute  Rehabilitation Services Pager      [phone] Office      [phone]

## 2020-03-25 NOTE — TOC Progression Note (Signed)
Transition of Care [facility]) - Progression Note    Patient Details  Name: [name] MRN: [id number] Date of Birth: 1932-08-25  Transition of Care [facility]) CM/SW [place], LCSW Phone Number: 03/25/2020, 9:50 AM  Clinical Narrative:    Facility-[facility] will accept the patient tomorrow.  Patient daughter [name] notified.  Physician notified.   Expected Discharge Plan: [place] Barriers to Discharge: Continued Medical Work up  Expected Discharge Plan and Services Expected Discharge Plan: [place] In-house Referral: Clinical Social Work     Living arrangements for the past 2 months: Single Family Home                 DME Arranged: N/A DME Agency: NA                   Social Determinants of Health (SDOH) Interventions    Readmission Risk Interventions Readmission Risk Prevention Plan 03/23/2020  Post Dischage Appt Complete  Medication Screening Complete  Transportation Screening Complete  Some recent data might be hidden

## 2020-03-26 DIAGNOSIS — J069 Acute upper respiratory infection, unspecified: Secondary | ICD-10-CM | POA: Diagnosis not present

## 2020-03-26 DIAGNOSIS — I10 Essential (primary) hypertension: Secondary | ICD-10-CM | POA: Diagnosis not present

## 2020-03-26 DIAGNOSIS — R52 Pain, unspecified: Secondary | ICD-10-CM | POA: Diagnosis not present

## 2020-03-26 DIAGNOSIS — R1312 Dysphagia, oropharyngeal phase: Secondary | ICD-10-CM | POA: Diagnosis not present

## 2020-03-26 DIAGNOSIS — R32 Unspecified urinary incontinence: Secondary | ICD-10-CM | POA: Diagnosis not present

## 2020-03-26 DIAGNOSIS — M25551 Pain in right hip: Secondary | ICD-10-CM | POA: Diagnosis not present

## 2020-03-26 DIAGNOSIS — M2550 Pain in unspecified joint: Secondary | ICD-10-CM | POA: Diagnosis not present

## 2020-03-26 DIAGNOSIS — S32601D Unspecified fracture of right ischium, subsequent encounter for fracture with routine healing: Secondary | ICD-10-CM | POA: Diagnosis not present

## 2020-03-26 DIAGNOSIS — R2681 Unsteadiness on feet: Secondary | ICD-10-CM | POA: Diagnosis not present

## 2020-03-26 DIAGNOSIS — M069 Rheumatoid arthritis, unspecified: Secondary | ICD-10-CM | POA: Diagnosis not present

## 2020-03-26 DIAGNOSIS — S32601S Unspecified fracture of right ischium, sequela: Secondary | ICD-10-CM | POA: Diagnosis not present

## 2020-03-26 DIAGNOSIS — Z7401 Bed confinement status: Secondary | ICD-10-CM | POA: Diagnosis not present

## 2020-03-26 DIAGNOSIS — G35 Multiple sclerosis: Secondary | ICD-10-CM | POA: Diagnosis not present

## 2020-03-26 DIAGNOSIS — R531 Weakness: Secondary | ICD-10-CM | POA: Diagnosis not present

## 2020-03-26 DIAGNOSIS — M810 Age-related osteoporosis without current pathological fracture: Secondary | ICD-10-CM | POA: Diagnosis not present

## 2020-03-26 DIAGNOSIS — E034 Atrophy of thyroid (acquired): Secondary | ICD-10-CM | POA: Diagnosis not present

## 2020-03-26 DIAGNOSIS — S32401D Unspecified fracture of right acetabulum, subsequent encounter for fracture with routine healing: Secondary | ICD-10-CM | POA: Diagnosis not present

## 2020-03-26 DIAGNOSIS — I872 Venous insufficiency (chronic) (peripheral): Secondary | ICD-10-CM | POA: Diagnosis not present

## 2020-03-26 DIAGNOSIS — R609 Edema, unspecified: Secondary | ICD-10-CM | POA: Diagnosis not present

## 2020-03-26 DIAGNOSIS — E871 Hypo-osmolality and hyponatremia: Secondary | ICD-10-CM | POA: Diagnosis not present

## 2020-03-26 DIAGNOSIS — M8580 Other specified disorders of bone density and structure, unspecified site: Secondary | ICD-10-CM | POA: Diagnosis not present

## 2020-03-26 DIAGNOSIS — R296 Repeated falls: Secondary | ICD-10-CM | POA: Diagnosis not present

## 2020-03-26 DIAGNOSIS — S32421G Displaced fracture of posterior wall of right acetabulum, subsequent encounter for fracture with delayed healing: Secondary | ICD-10-CM | POA: Diagnosis not present

## 2020-03-26 DIAGNOSIS — R41841 Cognitive communication deficit: Secondary | ICD-10-CM | POA: Diagnosis not present

## 2020-03-26 DIAGNOSIS — Z9181 History of falling: Secondary | ICD-10-CM | POA: Diagnosis not present

## 2020-03-26 DIAGNOSIS — R278 Other lack of coordination: Secondary | ICD-10-CM | POA: Diagnosis not present

## 2020-03-26 DIAGNOSIS — S32401S Unspecified fracture of right acetabulum, sequela: Secondary | ICD-10-CM | POA: Diagnosis not present

## 2020-03-26 DIAGNOSIS — R0902 Hypoxemia: Secondary | ICD-10-CM | POA: Diagnosis not present

## 2020-03-26 MED ORDER — LISINOPRIL 10 MG PO TABS: 10.0000 mg | ORAL_TABLET | Freq: Every day | ORAL | Status: DC

## 2020-03-26 MED ORDER — BISACODYL 10 MG RE SUPP
10.0000 mg | Freq: Once | RECTAL | Status: AC
  Administered 2020-03-26: 10 mg via RECTAL
  Filled 2020-03-26: qty 1

## 2020-03-26 MED ORDER — HYDROCODONE-ACETAMINOPHEN 5-325 MG PO TABS: 1.0000 | ORAL_TABLET | Freq: Four times a day (QID) | ORAL | 0 refills | Status: DC | PRN

## 2020-03-26 MED ORDER — METHOCARBAMOL 500 MG PO TABS: 500.0000 mg | ORAL_TABLET | Freq: Three times a day (TID) | ORAL | Status: DC | PRN

## 2020-03-26 MED ORDER — ACETAMINOPHEN 325 MG PO TABS: 650.0000 mg | ORAL_TABLET | Freq: Four times a day (QID) | ORAL | Status: DC | PRN

## 2020-03-26 MED ORDER — POLYETHYLENE GLYCOL 3350 17 G PO PACK: 17.0000 g | PACK | Freq: Every day | ORAL | 0 refills | Status: DC

## 2020-03-26 MED ORDER — SENNOSIDES-DOCUSATE SODIUM 8.6-50 MG PO TABS: 1.0000 | ORAL_TABLET | Freq: Two times a day (BID) | ORAL | Status: DC

## 2020-03-26 MED ORDER — MORPHINE SULFATE (PF) 2 MG/ML IV SOLN
2.0000 mg | Freq: Once | INTRAVENOUS | Status: AC
  Administered 2020-03-26: 2 mg via INTRAVENOUS
  Filled 2020-03-26: qty 1

## 2020-03-26 NOTE — Plan of Care (Signed)
Pt d/c.to facility in stable condition. No needs at time of ems transfer.

## 2020-03-26 NOTE — Progress Notes (Signed)
Latest BP: 155/82. Also, patient recalled having difficulty clearing her throat when talking to her daughter yesterday. Asked patient if she felt like she needed anything for her throat. She refused anything for that. After hearing that and her difficulty swallowing pills, paged Dr. Arby Barrette about a need for a Swallow study/speech consult. Waiting on any potential orders.

## 2020-03-26 NOTE — TOC Transition Note (Signed)
Transition of Care [facility]) - CM/SW Discharge Note   Patient Details  Name: [name] MRN: [id number] Date of Birth: [birth year]  [facility]) CM/SW Contact:  Ross Ludwig, LCSW Phone Number: 03/26/2020, 1:03 PM   Clinical Narrative:     Patient to be d/c'ed today to [place] room 503.  Patient and family agreeable to plans will transport via ems RN to call report to [phone].  Patient is aware that she is discharging today.      Final next level of care: Skilled Nursing Facility Barriers to Discharge: Barriers Resolved   Patient Goals and CMS Choice Patient states their goals for this hospitalization and ongoing recovery are:: To go to SNF for short term rehab, then return back home. CMS Medicare.gov Compare Post Acute Care list provided to:: Patient Choice offered to / list presented to : Patient  Discharge Placement   Existing PASRR number confirmed : 03/25/20          Patient chooses bed at: [facility] Patient to be transferred to facility by: [facility] Name of family member notified: Patient to notify her family about discharge plan. Patient and family notified of of transfer: 03/26/20  Discharge Plan and Services In-house Referral: Clinical Social Work              DME Arranged: N/A DME Agency: NA       HH Arranged: NA          Social Determinants of Health (SDOH) Interventions     Readmission Risk Interventions Readmission Risk Prevention Plan 03/23/2020  Post Dischage Appt Complete  Medication Screening Complete  Transportation Screening Complete  Some recent data might be hidden

## 2020-03-26 NOTE — Progress Notes (Signed)
Paged Dr. Arby Barrette, NP about patient's high BP (170/74). Orders/Response by Dr. Silas [place]: IV Hydralazine 0.5 ml, once Will administer to pt and continue to monitor BP.

## 2020-03-26 NOTE — Progress Notes (Signed)
Patient's BP dropped some, but still high after Hydralazine was given. Patient also still reported pain (6/10) despite Norco and Tylenol being given and being repositioned. Patient also reported feeling like Norco and Tylenol pills got stuck in her throat. Talked to patient about swallowing pills and she agreed to take big pills in apple sauce when time to take meds. Paged Dr. Arby Barrette, NP to make aware of her feeling like she got pills stuck in her throat. Patient is not in any respiratory distress. Aside from high BP, other vital signs are stable. Will continue to monitor patient.  New orders from Dr. Arby Barrette, NP: IV Morphine 1 ml for high BP and pain.  Will give this and continue to monitor patient.

## 2020-03-26 NOTE — Plan of Care (Signed)

## 2020-03-26 NOTE — Discharge Summary (Signed)
Physician Discharge Summary  [name] [id number] DOB: March 29, 1932 DOA: 03/22/2020  PCP: [name], MD  Admit date: 03/22/2020 Discharge date: 03/26/2020  Time spent: 45 minutes  Recommendations for Outpatient Follow-up:  PCP in 1 week, please check BMP at follow-up Rheumatology in 1 month Neurology Dr. Felecia Shelling in 1 to 2 months  Discharge Diagnoses:  Right hip pain Right acetabular and ischial fracture Frequent falls Primary progressive multiple sclerosis Rheumatoid arthritis   Multiple sclerosis, primary progressive (HCC)   RA (rheumatoid arthritis) ([place]) Essential hypertension  Discharge Condition: Stable, weightbearing as tolerated  Diet recommendation: Low-sodium heart healthy  Filed Weights   03/23/20 0500  Weight: 61.4 kg    History of present illness:  85 year old female with history of primary progressive multiple sclerosis, rheumatoid arthritis who has been falling frequently over the last 7 to 10 days,patient reports that her legs have been buckling, denies any unilateral symptoms she did see Dr. Sharol Given recently after one of her falls and had unremarkable imaging studies then. -4/14 morning she fell in the bathroom onto her right hip and had severe pain and limited mobility, and subsequently brought to the ED  Hospital Course:   Right hip pain, fall Acetabular and right ischial fracture -Admitted with right hip pain following bad fall  -MRI noted right acetabular and ischial fracture  -called and discussed with orthopedics Dr. Geralyn Corwin nonoperative management, consideration of rehab and weightbearing as tolerated -PT OT eval completed, SNF recommended for rehab -Social work consulted -Pain control-continue hydrocodone, as needed Robaxin -Continue laxatives -She will be discharged to SNF for short-term rehab  History of primary progressive multiple sclerosis -With recent frequent falls for the last 7 to 10 days -MRI LS spine with  contrast without evidence of active MS in lumbar spine -Continue home regimen  History of RA -Continue prednisone 3 mg daily -Also takes methotrexate weekly -Continue Sarilumab  Hypertension -Started on lisinopril -Check BMP in 1 week   Consultations:  D/w Orthopedics Dr.Duda  Discharge Exam: Vitals:   03/26/20 0109 03/26/20 0623  BP: (!) 155/82 140/69  Pulse: 68 72  Resp: 16 18  Temp: 98.6 F (37 C) 97.8 F (36.6 C)  SpO2: 93% 94%    General: AAOx3 Cardiovascular: S1S2/RRR Respiratory: CTAB  Discharge Instructions   Discharge Instructions    Diet - low sodium heart healthy   Complete by: As directed    Increase activity slowly   Complete by: As directed      Allergies as of 03/26/2020      Reactions   Sulfasalazine Anaphylaxis   Aspirin Nausea Only   Penicillins Diarrhea   Sulfa Antibiotics       Medication List    TAKE these medications   acetaminophen 325 MG tablet Commonly known as: TYLENOL Take 2 tablets (650 mg total) by mouth every 6 (six) hours as needed for mild pain (or Fever >/= 101).   azelastine 0.1 % nasal spray Commonly known as: ASTELIN Place 1 spray into the nose 2 (two) times daily as needed for allergies. Use in each nostril as directed   Biotin 1 MG Caps Take 1 capsule by mouth daily.   CALCIUM + D PO Take 1 tablet by mouth 2 (two) times daily.   folic acid 1 MG tablet Commonly known as: FOLVITE Take 2 mg by mouth daily.   HYDROcodone-acetaminophen 5-325 MG tablet Commonly known as: NORCO/VICODIN Take 1 tablet by mouth every 6 (six) hours as needed for moderate pain.   ICAPS AREDS  FORMULA PO Take 1 capsule by mouth daily.   KEVZARA [place] Inject into the skin every 14 (fourteen) days.   levothyroxine 25 MCG tablet Commonly known as: SYNTHROID Take 25 mcg by mouth daily.   lisinopril 10 MG tablet Commonly known as: ZESTRIL Take 1 tablet (10 mg total) by mouth daily. Start taking on: March 27, 2020   Magnesium  500 MG Tabs Take 0.5 tablets by mouth every evening.   methocarbamol 500 MG tablet Commonly known as: ROBAXIN Take 1 tablet (500 mg total) by mouth every 8 (eight) hours as needed for muscle spasms.   methotrexate 2.5 MG tablet Take 20 mg by mouth every Thursday.   polyethylene glycol 17 g packet Commonly known as: MIRALAX / GLYCOLAX Take 17 g by mouth daily. Start taking on: March 27, 2020   predniSONE 1 MG tablet Commonly known as: DELTASONE Take 3 mg by mouth daily.   Saline 0.9 % (Soln) Soln Cleanse left foot ulcer once daily.   senna-docusate 8.6-50 MG tablet Commonly known as: Senokot-S Take 1 tablet by mouth 2 (two) times daily.   Vitamin D (Ergocalciferol) 1.25 MG (50000 UNIT) Caps capsule Commonly known as: DRISDOL Take 1 capsule (50,000 Units total) by mouth every 7 (seven) days.      Allergies  Allergen Reactions  . Sulfasalazine Anaphylaxis  . Aspirin Nausea Only  . Penicillins Diarrhea  . Sulfa Antibiotics    Contact information for after-discharge care    Destination    [facility] Preferred SNF .   Service: Skilled Nursing Contact information: [address] [place] [phone]               The results of significant diagnostics from this hospitalization (including imaging, microbiology, ancillary and laboratory) are listed below for reference.    Significant Diagnostic Studies: DG Chest 1 View  Result Date: 03/22/2020 CLINICAL DATA:  Multiple falls with right hip pain EXAM: CHEST  1 VIEW COMPARISON:  12/08/2018 FINDINGS: Mild cardiomegaly.  No consolidation or effusion.  No pneumothorax. IMPRESSION: No active disease.  Mild cardiomegaly. Electronically Signed   By: Donavan Foil M.D.   On: 03/22/2020 23:03   MR Lumbar Spine W Wo Contrast  Result Date: 03/23/2020 CLINICAL DATA:  Multiple sclerosis with bilateral leg weakness. Frequent falls. Severe back pain. EXAM: MRI LUMBAR SPINE WITHOUT  AND WITH CONTRAST TECHNIQUE: Multiplanar and multiecho pulse sequences of the lumbar spine were obtained without and with intravenous contrast. CONTRAST:  47mL GADAVIST GADOBUTROL 1 MMOL/ML IV SOLN COMPARISON:  None FINDINGS: Segmentation:  Standard. Alignment:  Physiologic. Vertebrae:  No fracture, evidence of discitis, or bone lesion. Conus medullaris and cauda equina: Conus extends to the L1 level. Conus and cauda equina appear normal. Paraspinal and other soft tissues: Negative Disc levels: L1-L2: Normal disc space and facet joints. There is no spinal canal stenosis. No neural foraminal stenosis. L2-L3: Moderate facet hypertrophy with mild diffuse disc bulge. There is no spinal canal stenosis. No neural foraminal stenosis. L3-L4: Mild disc bulge. There is no spinal canal stenosis. No neural foraminal stenosis. L4-L5: Moderate facet hypertrophy and mild disc bulge. Sclerotic focus in the L4 vertebral body. There is no spinal canal stenosis. No neural foraminal stenosis. L5-S1: Normal disc space and facet joints. There is no spinal canal stenosis. No neural foraminal stenosis. Visualized sacrum: Normal. IMPRESSION: 1. No acute abnormality of the lumbar spine. 2. Mild multilevel degenerative disc disease without spinal canal or neural foraminal stenosis. 3. Moderate  facet arthrosis at L2-3 and L4-5. Electronically Signed   By: Ulyses Jarred M.D.   On: 03/23/2020 20:57   CT HIP RIGHT WO CONTRAST  Result Date: 03/15/2020 CLINICAL DATA:  Right hip pain for 2-3 days EXAM: CT OF THE RIGHT HIP WITHOUT CONTRAST TECHNIQUE: Multidetector CT imaging of the right hip was performed according to the standard protocol. Multiplanar CT image reconstructions were also generated. COMPARISON:  X-ray 03/15/2020 FINDINGS: Bones/Joint/Cartilage Subtle linear lucency within the subchondral portion of the acetabular roof seen only on axial imaging (series 3, images 14-15) which could reflect a prominent vascular channel versus a  nondisplaced fracture. Osseous structures appear otherwise intact. No additional fracture. No dislocation. Mild-to-moderate hip joint space narrowing posteriorly with posterior acetabular subchondral cystic change. No evidence of femoral head AVN. No suspicious lytic or sclerotic bone lesion. No appreciable hip joint effusion. Ligaments Suboptimally assessed by CT. Muscles and Tendons Preserved muscle bulk. Tendinous structures appear grossly intact within the limitations of CT. Soft tissues No soft tissue fluid collection or hematoma. No right inguinal lymphadenopathy. IMPRESSION: 1. Subtle linear lucency within the subchondral portion of the acetabular roof seen only on axial imaging which could reflect a prominent vascular channel versus a nondisplaced fracture. Findings could be further evaluated with MRI, as clinically indicated. 2. Osseous structures appear otherwise intact. 3. Degenerative changes of the right hip most pronounced posteriorly. These results will be called to the ordering clinician or representative by the Radiologist Assistant, and communication documented in the PACS or [employer]. Electronically Signed   By: Davina Poke D.O.   On: 03/15/2020 12:13   CT HIP RIGHT WO CONTRAST  Result Date: 03/15/2020 CLINICAL DATA:  Injured right hip 2-3 days ago. Severe hip pain. EXAM: CT OF THE RIGHT HIP WITHOUT CONTRAST TECHNIQUE: Multidetector CT imaging of the right hip was performed according to the standard protocol. Multiplanar CT image reconstructions were also generated. COMPARISON:  Radiographs, same date. FINDINGS: The right hip is normally located. No acute hip fracture or AVN. Mild to moderate age related degenerative changes with joint space narrowing and mild spurring. No obvious joint effusion. The visualized right hemipelvis appears intact. No pubic rami are acetabular fractures. Chondrocalcinosis noted at the pubic symphysis. No obvious muscle tear or intramuscular hematoma. No  subcutaneous lesions or hematoma. No significant intrapelvic abnormalities are identified. No inguinal mass or hernia. IMPRESSION: 1. No acute hip fracture or AVN. 2. Mild to moderate age related degenerative changes but no obvious joint effusion. 3. No obvious muscle injury or hematoma. 4. If symptoms persist or worsen MRI may be helpful for further evaluation. Electronically Signed   By: Marijo Sanes M.D.   On: 03/15/2020 12:11   MR HIP RIGHT WO CONTRAST  Result Date: 03/23/2020 CLINICAL DATA:  Right hip pain after fall 7 doses. Inability to ambulate EXAM: MR OF THE RIGHT HIP WITHOUT CONTRAST TECHNIQUE: Multiplanar, multisequence MR imaging was performed. No intravenous contrast was administered. COMPARISON:  X-ray 03/22/2020 FINDINGS: Technical note: Motion degraded exam. Bones: Acute fracture of the central portion of the acetabular roof with 4 mm of articular-surface diastasis (series 7, image 9; series 9, images 10-12). There is an additional nondisplaced fracture involving the posterior acetabulum (series 7, images 9-12). Marrow edema anteriorly at the level of the AIIS. Nondisplaced fracture of the right ischium extending to the ischial tuberosity (series 4 and 5, image 5). Proximal right femur is intact without fracture or dislocation. No femoral head AVN. No additional fractures. Pubic symphysis and SI  joints intact without diastasis. Articular cartilage and labrum Articular cartilage: Mild-to-moderate chondral thinning and surface irregularity. Labrum: Degeneration and tearing of the superior and anterosuperior labrum. Joint or bursal effusion Joint effusion:  Small-moderate joint effusion. Bursae: No abnormal bursal fluid collection. Muscles and tendons Muscles and tendons: Intramuscular and soft tissue edema surrounding the right hip and hemipelvis most pronounced within the right iliacus, right gluteus minimus, and right rectus femoris muscles. Tendons are intact without tear. Other findings  Miscellaneous:   None. IMPRESSION: 1. Acute fracture of the central portion of the acetabular roof with 4 mm of articular-surface diastasis. 2. Acute nondisplaced fracture of the posterior acetabulum. 3. Acute nondisplaced fracture of the right ischium extending to the ischial tuberosity. 4. Right proximal femur intact without fracture or dislocation. 5. Small to moderate right hip joint effusion, likely reactive. These results will be called to the ordering clinician or representative by the Radiologist Assistant, and communication documented in the PACS or [employer]. Electronically Signed   By: Davina Poke D.O.   On: 03/23/2020 08:03   DG Hip Unilat W or Wo Pelvis 2-3 Views Right  Result Date: 03/22/2020 CLINICAL DATA:  Multiple fall EXAM: DG HIP (WITH OR WITHOUT PELVIS) 2-3V RIGHT COMPARISON:  03/20/2020 FINDINGS: SI joints are non widened. Pubic symphysis is intact. Old left superior and inferior pubic rami fractures. Bones appear osteopenic. No acute displaced fracture or malalignment. IMPRESSION: No acute osseous abnormality. CT or MRI follow-up may be pursued if continued concern for hip fracture. Old left pubic rami fractures. Electronically Signed   By: Donavan Foil M.D.   On: 03/22/2020 23:03   XR HIP UNILAT W OR W/O PELVIS 2-3 VIEWS RIGHT  Result Date: 03/24/2020 2 view radiographs of the right hip shows no evidence of an intertrochanteric or femoral neck fracture.  There is no displaced pubic rami or acetabular fracture.  XR HIP UNILAT W OR W/O PELVIS 2-3 VIEWS RIGHT  Result Date: 03/15/2020 Radiographs of the right hip without evidence of acute fracture.  Joint space well preserved   Microbiology: Recent Results (from the past 240 hour(s))  Urine culture     Status: Abnormal   Collection Time: 03/23/20  2:01 AM   Specimen: Urine  Result Value Ref Range Status   Specimen Description   Final    Urine Performed at [facility] [address]  [place], [place] [address]    Special Requests   Final    NONE Performed at [facility], [place] [address], [place], [place] [address]    Culture (A)  Final    <10,000 COLONIES/mL INSIGNIFICANT GROWTH Performed at [facility] [address] [place], [place] [address]    Report Status 03/24/2020 FINAL  Final  SARS Coronavirus 2 by RT PCR     Status: None   Collection Time: 03/23/20  4:15 AM  Result Value Ref Range Status   SARS Coronavirus 2 NEGATIVE NEGATIVE Final    Comment: (NOTE) Result indicates the ABSENCE of SARS-CoV-2 RNA in the patient specimen.  The lowest concentration of SARS-CoV-2 viral copies this assay can detect in nasopharyngeal swab specimens is 500 copies / mL.  A negative result does not preclude SARS-CoV-2 infection and should not be used as the sole basis for patient management decisions. A negative result may occur with improper specimen collection / handling, submission of a specimen other than nasopharyngeal swab, presence of viral mutation(s) within the areas targeted by this assay, and inadequate  number of viral copies (<500 copies / mL) present.  Negative results must be combined with clinical observations, patient history, and epidemiological information.  The expected result is NEGATIVE.  Patient Fact Sheet:  BlogSelections.co.uk   Provider Fact Sheet:  https://lucas.com/   This test is not yet approved or cleared by the [place] FDA and  has been authorized for  detection and/or diagnosis of SARS-CoV-2 by FDA under an Emergency Use Authorization (EUA).  This EUA will remain in effect (meaning this test can be used) for the duration of  the COVID-19 declaration under Section 564(b)(1) of the Act, 21 U.S.C. section 360bbb-3(b)(1), unless the authorization is terminated or revoked sooner Performed at [facility], [place] [address], [place], [place] [address]   SARS  CORONAVIRUS 2 (TAT 6-24 HRS) Nasopharyngeal Nasopharyngeal Swab     Status: None   Collection Time: 03/25/20  6:21 AM   Specimen: Nasopharyngeal Swab  Result Value Ref Range Status   SARS Coronavirus 2 NEGATIVE NEGATIVE Final    Comment: (NOTE) SARS-CoV-2 target nucleic acids are NOT DETECTED. The SARS-CoV-2 RNA is generally detectable in upper and lower respiratory specimens during the acute phase of infection. Negative results do not preclude SARS-CoV-2 infection, do not rule out co-infections with other pathogens, and should not be used as the sole basis for treatment or other patient management decisions. Negative results must be combined with clinical observations, patient history, and epidemiological information. The expected result is Negative. Fact Sheet for Patients: SugarRoll.be Fact Sheet for Healthcare Providers: https://www.woods-mathews.com/ This test is not yet approved or cleared by the [place] FDA and  has been authorized for detection and/or diagnosis of SARS-CoV-2 by FDA under an Emergency Use Authorization (EUA). This EUA will remain  in effect (meaning this test can be used) for the duration of the COVID-19 declaration under Section 56 4(b)(1) of the Act, 21 U.S.C. section 360bbb-3(b)(1), unless the authorization is terminated or revoked sooner. Performed at [facility], [place] [address], [place], [place] [address]      Labs: Basic Metabolic Panel: Recent Labs  Lab 03/22/20 2229 03/23/20 0805  NA 134* 137  K 4.0 3.9  CL 100 104  CO2 25 25  GLUCOSE 108* 100*  BUN 26* 20  CREATININE 0.63 0.63  CALCIUM 9.7 8.8*   Liver Function Tests: Recent Labs  Lab 03/23/20 0805  AST 50*  ALT 33  ALKPHOS 56  BILITOT 1.0  PROT 5.4*  ALBUMIN 3.7   No results for input(s): LIPASE, AMYLASE in the last 168 hours. No results for input(s): AMMONIA in the last 168 hours. CBC: Recent Labs  Lab 03/22/20 2229   WBC 6.8  NEUTROABS 2.7  HGB 13.5  HCT 40.0  MCV 98.5  PLT 150   Cardiac Enzymes: Recent Labs  Lab 03/22/20 2229  CKTOTAL 455*   BNP: BNP (last 3 results) No results for input(s): BNP in the last 8760 hours.  ProBNP (last 3 results) No results for input(s): PROBNP in the last 8760 hours.  CBG: No results for input(s): GLUCAP in the last 168 hours.     Signed:  Domenic Polite MD.  [facility] 03/26/2020, 12:11 PM

## 2020-03-27 ENCOUNTER — Non-Acute Institutional Stay (SKILLED_NURSING_FACILITY): Payer: Medicare Other | Admitting: Internal Medicine

## 2020-03-27 ENCOUNTER — Encounter: Payer: Self-pay | Admitting: Internal Medicine

## 2020-03-27 DIAGNOSIS — G35 Multiple sclerosis: Secondary | ICD-10-CM | POA: Diagnosis not present

## 2020-03-27 DIAGNOSIS — S32601S Unspecified fracture of right ischium, sequela: Secondary | ICD-10-CM | POA: Diagnosis not present

## 2020-03-27 DIAGNOSIS — E034 Atrophy of thyroid (acquired): Secondary | ICD-10-CM

## 2020-03-27 DIAGNOSIS — M25551 Pain in right hip: Secondary | ICD-10-CM

## 2020-03-27 DIAGNOSIS — S32401S Unspecified fracture of right acetabulum, sequela: Secondary | ICD-10-CM

## 2020-03-27 DIAGNOSIS — I10 Essential (primary) hypertension: Secondary | ICD-10-CM | POA: Diagnosis not present

## 2020-03-27 DIAGNOSIS — M069 Rheumatoid arthritis, unspecified: Secondary | ICD-10-CM

## 2020-03-27 NOTE — Progress Notes (Signed)
: Provider:  Hennie Duos., MD Location:  [facility] Number: [id number] Place of Service:  SNF ([phone])  PCP: [name], MD Patient Care Team: [name], MD as PCP - General (Family Medicine) Leonie Man, MD as PCP - Cardiology (Cardiology)  Extended Emergency Contact Information Primary Emergency Contact: [place] Phone: [phone] Relation: Daughter Secondary Emergency Contact: Stephani Police [place] of [place] Phone: [phone] Relation: Son     Allergies: Sulfasalazine, Aspirin, Penicillins, and Sulfa antibiotics  Chief Complaint  Patient presents with  . New Admit To SNF    New admission to [facility] SNF    HPI: Patient is an 86 y.o. female with history of primary progressive multiple sclerosis and rheumatoid arthritis who had been following frequently (33 days, reporting that her legs have been buckling, and denied any unilateral symptoms.  She did see Dr. Lucia Gaskins recently after one of her falls and had unremarkable imaging studies been.  On the morning of 28-Mar-2023 patient fell in the bathroom onto her right hip and had severe pain and was brought to the emergency department.  Patient was admitted to [facility] from Mar 27, 2017 where she was found to have a right acetabular and right ischial fracture noted on MRI.  Nonoperative management was recommended with consideration of rehab.  Other chronic medical conditions were stable.  Patient is admitted to skilled nursing facility for OT/PT.  While at skilled nursing facility patient will be followed for rheumatoid arthritis treated with Darcus Pester and methotrexate, hypertension treated with lisinopril and hypothyroidism treated with Synthroid.  Past Medical History:  Diagnosis Date  . Chronic venous insufficiency of lower extremity 01/25/2019   With chronic bilateral lower extremity edema  . Incontinent of urine    . Multiple sclerosis ([place]) 2015  . Osteoporosis    osteopenia  . Polyarthritis   . Post-menopausal   . Rheumatoid arthritis ([place])   . Temporal arteritis ([place]) 2011    Past Surgical History:  Procedure Laterality Date  . ABDOMINAL HYSTERECTOMY  1971   TVH  . breast ductectomy Right 05/1995  . COLONOSCOPY  10/2011    Allergies as of 03/27/2020      Reactions   Sulfasalazine Anaphylaxis   Aspirin Nausea Only   Penicillins Diarrhea   Sulfa Antibiotics       Medication List       Accurate as of March 27, 2020 12:01 PM. If you have any questions, ask your nurse or doctor.        acetaminophen 325 MG tablet Commonly known as: TYLENOL Take 2 tablets (650 mg total) by mouth every 6 (six) hours as needed for mild pain (or Fever >/= 101).   azelastine 0.1 % nasal spray Commonly known as: ASTELIN Place 1 spray into the nose 2 (two) times daily as needed for allergies. Use in each nostril as directed   Biotin 1 MG Caps Take 1 capsule by mouth daily.   bisacodyl 10 MG suppository Commonly known as: DULCOLAX Place 10 mg rectally daily as needed for moderate constipation.   CALCIUM + D PO Take 1 tablet by mouth 2 (two) times daily.   folic acid 1 MG tablet Commonly known as: FOLVITE Take 2 mg by mouth daily.   HYDROcodone-acetaminophen 5-325 MG tablet Commonly known as: NORCO/VICODIN Take 1 tablet by mouth every 6 (six) hours as needed for moderate pain.  ICAPS AREDS FORMULA PO Take 1 capsule by mouth daily.   KEVZARA [place] Inject into the skin every 14 (fourteen) days.   levothyroxine 25 MCG tablet Commonly known as: SYNTHROID Take 25 mcg by mouth daily.   lisinopril 10 MG tablet Commonly known as: ZESTRIL Take 1 tablet (10 mg total) by mouth daily.   Magnesium 500 MG Tabs Take 250 tablets by mouth every evening. 0.5 tablet   magnesium hydroxide 400 MG/5ML suspension Commonly known as: MILK OF MAGNESIA Take 30 mLs by mouth daily as needed for mild  constipation.   methocarbamol 500 MG tablet Commonly known as: ROBAXIN Take 1 tablet (500 mg total) by mouth every 8 (eight) hours as needed for muscle spasms.   methotrexate 2.5 MG tablet Take 20 mg by mouth every Thursday. Give 8 tablets to = 20 mg   polyethylene glycol 17 g packet Commonly known as: MIRALAX / GLYCOLAX Take 17 g by mouth daily.   predniSONE 1 MG tablet Commonly known as: DELTASONE Take 3 mg by mouth daily. Give 3 tablets to = 3 mg   Saline 0.9 % (Soln) Soln Cleanse left foot ulcer once daily.   senna-docusate 8.6-50 MG tablet Commonly known as: Senokot-S Take 1 tablet by mouth 2 (two) times daily.   SODIUM PHOSPHATES RE Place 1 each rectally daily as needed.   Vitamin D (Ergocalciferol) 1.25 MG (50000 UNIT) Caps capsule Commonly known as: DRISDOL Take 1 capsule (50,000 Units total) by mouth every 7 (seven) days.       No orders of the defined types were placed in this encounter.   Immunization History  Administered Date(s) Administered  . DTaP 12/09/1994  . Influenza-Unspecified 08/10/2019  . PFIZER SARS-COV-2 Vaccination 01/16/2020, 02/10/2020    Social History   Tobacco Use  . Smoking status: Never Smoker  . Smokeless tobacco: Never Used  Substance Use Topics  . Alcohol use: No    Family history is   Family History  Problem Relation Age of Onset  . Osteoarthritis Father   . Stroke Father   . Heart failure Mother   . Hypertension Mother   . Heart disease Mother   . Multiple sclerosis Cousin   . Multiple sclerosis Cousin   . Multiple sclerosis Cousin       Review of Systems  GENERAL:  no fevers, fatigue, appetite changes SKIN: No itching, or rash EYES: No eye pain, redness, discharge EARS: No earache, tinnitus, change in hearing NOSE: No congestion, drainage or bleeding  MOUTH/THROAT: No mouth or tooth pain, No sore throat RESPIRATORY: No cough, wheezing, SOB CARDIAC: No chest pain, palpitations, lower extremity edema    GI: No abdominal pain, No N/V/D or constipation, No heartburn or reflux  GU: No dysuria, frequency or urgency, or incontinence  MUSCULOSKELETAL: No unrelieved bone/joint pain NEUROLOGIC: No headache, dizziness or focal weakness PSYCHIATRIC: No c/o anxiety or sadness   Vitals:   03/27/20 1147  BP: (!) 151/68  Pulse: 68  Resp: 18  Temp: 98.2 F (36.8 C)  SpO2: 98%    SpO2 Readings from Last 1 Encounters:  03/27/20 98%   Body mass index is 21.2 kg/m.     Physical Exam  GENERAL APPEARANCE: Alert, conversant,  No acute distress.  SKIN: No diaphoresis rash HEAD: Normocephalic, atraumatic  EYES: Conjunctiva/lids clear. Pupils round, reactive. EOMs intact.  EARS: External exam WNL, canals clear. Hearing grossly normal.  NOSE: No deformity or discharge.  MOUTH/THROAT: Lips w/o lesions  RESPIRATORY: Breathing is even, unlabored. Lung  sounds are clear   CARDIOVASCULAR: Heart RRR no murmurs, rubs or gallops. No peripheral edema.   GASTROINTESTINAL: Abdomen is soft, non-tender, not distended w/ normal bowel sounds. GENITOURINARY: Bladder non tender, not distended  MUSCULOSKELETAL: No abnormal joints or musculature NEUROLOGIC:  Cranial nerves 2-12 grossly intact. Moves all extremities  PSYCHIATRIC: Mood and affect appropriate to situation, no behavioral issues  Patient Active Problem List   Diagnosis Date Noted  . Frequent falls 03/23/2020  . RA (rheumatoid arthritis) ([place]) 03/23/2020  . Tympanic membrane central perforation 12/15/2019  . Cholesteatoma of external auditory canal, right 09/10/2019  . Hearing loss 09/10/2019  . Chronic venous insufficiency of lower extremity 01/25/2019  . Grade II diastolic dysfunction [id number]  . Urinary dysfunction 12/22/2017  . Multiple sclerosis, primary progressive ([place]) 06/19/2017  . Left foot drop 03/17/2017  . Primary chronic progressive multiple sclerosis ([place]) 03/17/2017  . Myelopathy ([place]) 03/17/2017  . Bilateral lower extremity  edema 03/17/2017      Labs reviewed: Basic Metabolic Panel:    Component Value Date/Time   NA 137 03/23/2020 0805   K 3.9 03/23/2020 0805   CL 104 03/23/2020 0805   CO2 25 03/23/2020 0805   GLUCOSE 100 (H) 03/23/2020 0805   BUN 20 03/23/2020 0805   CREATININE 0.63 03/23/2020 0805   CREATININE 0.82 02/23/2019 1444   CALCIUM 8.8 (L) 03/23/2020 0805   PROT 5.4 (L) 03/23/2020 0805   ALBUMIN 3.7 03/23/2020 0805   AST 50 (H) 03/23/2020 0805   AST 27 02/23/2019 1444   ALT 33 03/23/2020 0805   ALT 27 02/23/2019 1444   ALKPHOS 56 03/23/2020 0805   BILITOT 1.0 03/23/2020 0805   BILITOT 0.4 02/23/2019 1444   GFRNONAA >60 03/23/2020 0805   GFRNONAA >60 02/23/2019 1444   GFRAA >60 03/23/2020 0805   GFRAA >60 02/23/2019 1444    Recent Labs    03/22/20 2229 03/23/20 0805  NA 134* 137  K 4.0 3.9  CL 100 104  CO2 25 25  GLUCOSE 108* 100*  BUN 26* 20  CREATININE 0.63 0.63  CALCIUM 9.7 8.8*   Liver Function Tests: Recent Labs    03/23/20 0805  AST 50*  ALT 33  ALKPHOS 56  BILITOT 1.0  PROT 5.4*  ALBUMIN 3.7   No results for input(s): LIPASE, AMYLASE in the last 8760 hours. No results for input(s): AMMONIA in the last 8760 hours. CBC: Recent Labs    03/22/20 2229  WBC 6.8  NEUTROABS 2.7  HGB 13.5  HCT 40.0  MCV 98.5  PLT 150   Lipid No results for input(s): CHOL, HDL, LDLCALC, TRIG in the last 8760 hours.  Cardiac Enzymes: Recent Labs    03/22/20 2229  CKTOTAL 455*   BNP: No results for input(s): BNP in the last 8760 hours. No results found for: MICROALBUR No results found for: HGBA1C Lab Results  Component Value Date   TSH 4.716 (H) 03/23/2020   No results found for: VITAMINB12 No results found for: FOLATE No results found for: IRON, TIBC, FERRITIN  Imaging and Procedures obtained prior to SNF admission: DG Chest 1 View  Result Date: 03/22/2020 CLINICAL DATA:  Multiple falls with right hip pain EXAM: CHEST  1 VIEW COMPARISON:  12/08/2018  FINDINGS: Mild cardiomegaly.  No consolidation or effusion.  No pneumothorax. IMPRESSION: No active disease.  Mild cardiomegaly. Electronically Signed   By: Donavan Foil M.D.   On: 03/22/2020 23:03   MR Lumbar Spine W Wo Contrast  Result Date: 03/23/2020  CLINICAL DATA:  Multiple sclerosis with bilateral leg weakness. Frequent falls. Severe back pain. EXAM: MRI LUMBAR SPINE WITHOUT AND WITH CONTRAST TECHNIQUE: Multiplanar and multiecho pulse sequences of the lumbar spine were obtained without and with intravenous contrast. CONTRAST:  23mL GADAVIST GADOBUTROL 1 MMOL/ML IV SOLN COMPARISON:  None FINDINGS: Segmentation:  Standard. Alignment:  Physiologic. Vertebrae:  No fracture, evidence of discitis, or bone lesion. Conus medullaris and cauda equina: Conus extends to the L1 level. Conus and cauda equina appear normal. Paraspinal and other soft tissues: Negative Disc levels: L1-L2: Normal disc space and facet joints. There is no spinal canal stenosis. No neural foraminal stenosis. L2-L3: Moderate facet hypertrophy with mild diffuse disc bulge. There is no spinal canal stenosis. No neural foraminal stenosis. L3-L4: Mild disc bulge. There is no spinal canal stenosis. No neural foraminal stenosis. L4-L5: Moderate facet hypertrophy and mild disc bulge. Sclerotic focus in the L4 vertebral body. There is no spinal canal stenosis. No neural foraminal stenosis. L5-S1: Normal disc space and facet joints. There is no spinal canal stenosis. No neural foraminal stenosis. Visualized sacrum: Normal. IMPRESSION: 1. No acute abnormality of the lumbar spine. 2. Mild multilevel degenerative disc disease without spinal canal or neural foraminal stenosis. 3. Moderate facet arthrosis at L2-3 and L4-5. Electronically Signed   By: Ulyses Jarred M.D.   On: 03/23/2020 20:57   MR HIP RIGHT WO CONTRAST  Result Date: 03/23/2020 CLINICAL DATA:  Right hip pain after fall 7 doses. Inability to ambulate EXAM: MR OF THE RIGHT HIP WITHOUT  CONTRAST TECHNIQUE: Multiplanar, multisequence MR imaging was performed. No intravenous contrast was administered. COMPARISON:  X-ray 03/22/2020 FINDINGS: Technical note: Motion degraded exam. Bones: Acute fracture of the central portion of the acetabular roof with 4 mm of articular-surface diastasis (series 7, image 9; series 9, images 10-12). There is an additional nondisplaced fracture involving the posterior acetabulum (series 7, images 9-12). Marrow edema anteriorly at the level of the AIIS. Nondisplaced fracture of the right ischium extending to the ischial tuberosity (series 4 and 5, image 5). Proximal right femur is intact without fracture or dislocation. No femoral head AVN. No additional fractures. Pubic symphysis and SI joints intact without diastasis. Articular cartilage and labrum Articular cartilage: Mild-to-moderate chondral thinning and surface irregularity. Labrum: Degeneration and tearing of the superior and anterosuperior labrum. Joint or bursal effusion Joint effusion:  Small-moderate joint effusion. Bursae: No abnormal bursal fluid collection. Muscles and tendons Muscles and tendons: Intramuscular and soft tissue edema surrounding the right hip and hemipelvis most pronounced within the right iliacus, right gluteus minimus, and right rectus femoris muscles. Tendons are intact without tear. Other findings Miscellaneous:   None. IMPRESSION: 1. Acute fracture of the central portion of the acetabular roof with 4 mm of articular-surface diastasis. 2. Acute nondisplaced fracture of the posterior acetabulum. 3. Acute nondisplaced fracture of the right ischium extending to the ischial tuberosity. 4. Right proximal femur intact without fracture or dislocation. 5. Small to moderate right hip joint effusion, likely reactive. These results will be called to the ordering clinician or representative by the Radiologist Assistant, and communication documented in the PACS or [employer]. Electronically  Signed   By: Davina Poke D.O.   On: 03/23/2020 08:03   DG Hip Unilat W or Wo Pelvis 2-3 Views Right  Result Date: 03/22/2020 CLINICAL DATA:  Multiple fall EXAM: DG HIP (WITH OR WITHOUT PELVIS) 2-3V RIGHT COMPARISON:  03/20/2020 FINDINGS: SI joints are non widened. Pubic symphysis is intact. Old left superior and  inferior pubic rami fractures. Bones appear osteopenic. No acute displaced fracture or malalignment. IMPRESSION: No acute osseous abnormality. CT or MRI follow-up may be pursued if continued concern for hip fracture. Old left pubic rami fractures. Electronically Signed   By: Donavan Foil M.D.   On: 03/22/2020 23:03     Not all labs, radiology exams or other studies done during hospitalization come through on my EPIC note; however they are reviewed by me.    Assessment and Plan  Fall/right acetabular fracture/right ischial fracture-noted not on plain films or CT but noted on MRI; 4 "nonoperative management SNF-admitted for OT/PT; continue calcium plus D tablet by mouth twice daily  History of progressive multiple sclerosis-MRI of LS spine with contrast without evidence of acute MS and lumbar spine SNF-patient requires active medical therapy; continue supportive therapy  Rheumatoid arthritis SNF-appears stable; continue Kevzara injection in the skin in q. 14 days, methotrexate 20 mg p.o. weekly and prednisone 3 mg daily  Hypertension SNF-controlled; continue lisinopril 10 mg daily  Hypothyroidism SNF-not stated Controlled; continue Synthroid 25 mcg daily     time spent greater than 45 minutes;> 50% of time with patient was spent reviewing records, labs, tests and studies, counseling and developing plan of care  Hennie Duos, MD

## 2020-03-28 MED ORDER — HYDROCODONE-ACETAMINOPHEN 5-325 MG PO TABS: 1.0000 | ORAL_TABLET | Freq: Four times a day (QID) | ORAL | 0 refills | Status: DC | PRN

## 2020-03-29 ENCOUNTER — Other Ambulatory Visit: Payer: Self-pay | Admitting: *Deleted

## 2020-03-29 NOTE — Patient Outreach (Signed)
Screened for potential [facility] Care Management needs as a benefit of  [employer] Medicare.  Mrs. Bennison is currently receiving skilled therapy at [facility].   Writer attended telephonic interdisciplinary team meeting to assess for disposition needs and transition plan for resident.   Facility reports member has had spasms. She lives alone. Appears, per chart records, no local family.   Will continue to follow for transition needs and potential [facility] services. Will plan outreach as appropriate.   Marthenia Rolling, MSN-Ed, RN,BSN [place] Acute Care Coordinator [phone] [facility]) [phone]  (Toll free office)

## 2020-03-30 ENCOUNTER — Other Ambulatory Visit: Payer: Self-pay

## 2020-03-30 ENCOUNTER — Ambulatory Visit (INDEPENDENT_AMBULATORY_CARE_PROVIDER_SITE_OTHER): Payer: Medicare Other | Admitting: Orthopedic Surgery

## 2020-03-30 ENCOUNTER — Ambulatory Visit (INDEPENDENT_AMBULATORY_CARE_PROVIDER_SITE_OTHER): Payer: Medicare Other

## 2020-03-30 ENCOUNTER — Encounter: Payer: Self-pay | Admitting: Physician Assistant

## 2020-03-30 VITALS — Ht 67.0 in | Wt 135.4 lb

## 2020-03-30 DIAGNOSIS — M25551 Pain in right hip: Secondary | ICD-10-CM

## 2020-03-30 DIAGNOSIS — R296 Repeated falls: Secondary | ICD-10-CM

## 2020-03-30 NOTE — Progress Notes (Signed)
Office Visit Note   Patient: [name]           Date of Birth: 15-May-1932           MRN: [id number] Visit Date: 03/30/2020              Requested by: [name], MD [address] [place],  [place] [address] PCP: [name], MD  Chief Complaint  Patient presents with  . Right Hip - Fracture, Follow-up      HPI: Patient is an 86 year old woman who presents in follow-up for right acetabular fracture.  She has had multiple falls recently and right after she had a CT scan of her hip she fell again sustaining an acetabular fracture.  Patient is currently in skilled nursing nonweightbearing on the right.  Assessment & Plan: Visit Diagnoses:  1. Multiple falls   2. Pain in right hip     Plan: We will have her continue with nonweightbearing on the right lower extremity worked with therapy for strengthening of her upper and lower extremities.  Plan to follow-up in 4 weeks with repeat 2 view radiographs of the right hip.  Follow-Up Instructions: Return in about 4 weeks (around 04/27/2020).   Ortho Exam  Patient is alert, oriented, no adenopathy, well-dressed, normal affect, normal respiratory effort. Examination patient has pain with range of motion of the right hip.  Review of the MRI scan from March 23, 2020 shows about 4 mm of displacement of the acetabular fracture.  Radiographs obtained today shows an anterior column and posterior wall fracture of the acetabulum with some protrusio of the femur. Imaging: XR HIP UNILAT W OR W/O PELVIS 2-3 VIEWS RIGHT  Result Date: 03/30/2020 2 view radiographs of the right hip shows a anterior wall and posterior column fracture with protrusio of the femoral head within the acetabulum  No images are attached to the encounter.  Labs: Lab Results  Component Value Date   ESRSEDRATE 108 (H) 04/03/2010   REPTSTATUS 03/24/2020 FINAL 03/23/2020   CULT (A) 03/23/2020    <10,000 COLONIES/mL INSIGNIFICANT  GROWTH Performed at [facility] [address], [place], [place] [address]      Lab Results  Component Value Date   ALBUMIN 3.7 03/23/2020   ALBUMIN 4.0 02/23/2019   ALBUMIN 3.9 12/26/2016    No results found for: MG No results found for: VD25OH  No results found for: PREALBUMIN CBC EXTENDED Latest Ref Rng & Units 03/22/2020 02/23/2019 12/26/2016  WBC 4.0 - 10.5 K/uL 6.8 11.0(H) 5.7  RBC 3.87 - 5.11 MIL/uL 4.06 4.76 4.39  HGB 12.0 - 15.0 g/dL 13.5 13.8 13.2  HCT 36.0 - 46.0 % 40.0 43.2 37.5  PLT 150 - 400 K/uL 150 260 162  NEUTROABS 1.7 - 7.7 K/uL 2.7 8.6(H) 4.0  LYMPHSABS 0.7 - 4.0 K/uL 3.4 1.7 1.0     Body mass index is 21.2 kg/m.  Orders:  Orders Placed This Encounter  Procedures  . XR HIP UNILAT W OR W/O PELVIS 2-3 VIEWS RIGHT   No orders of the defined types were placed in this encounter.    Procedures: No procedures performed  Clinical Data: No additional findings.  ROS:  All other systems negative, except as noted in the HPI. Review of Systems  Objective: Vital Signs: Ht 5\' 7"  (1.702 m)   Wt 135 lb 5.8 oz (61.4 kg)   LMP 09/08/1970 (Approximate)   BMI 21.20 kg/m   Specialty Comments:  No specialty  comments available.  PMFS History: Patient Active Problem List   Diagnosis Date Noted  . Frequent falls 03/23/2020  . RA (rheumatoid arthritis) ([place]) 03/23/2020  . Tympanic membrane central perforation 12/15/2019  . Cholesteatoma of external auditory canal, right 09/10/2019  . Hearing loss 09/10/2019  . Chronic venous insufficiency of lower extremity 01/25/2019  . Grade II diastolic dysfunction [id number]  . Urinary dysfunction 12/22/2017  . Multiple sclerosis, primary progressive ([place]) 06/19/2017  . Left foot drop 03/17/2017  . Primary chronic progressive multiple sclerosis ([place]) 03/17/2017  . Myelopathy ([place]) 03/17/2017  . Bilateral lower extremity edema 03/17/2017   Past Medical History:  Diagnosis Date  . Chronic venous  insufficiency of lower extremity 01/25/2019   With chronic bilateral lower extremity edema  . Incontinent of urine   . Multiple sclerosis ([place]) 2015  . Osteoporosis    osteopenia  . Polyarthritis   . Post-menopausal   . Rheumatoid arthritis ([place])   . Temporal arteritis ([place]) 2011    Family History  Problem Relation Age of Onset  . Osteoarthritis Father   . Stroke Father   . Heart failure Mother   . Hypertension Mother   . Heart disease Mother   . Multiple sclerosis Cousin   . Multiple sclerosis Cousin   . Multiple sclerosis Cousin     Past Surgical History:  Procedure Laterality Date  . ABDOMINAL HYSTERECTOMY  1971   TVH  . breast ductectomy Right 05/1995  . COLONOSCOPY  10/2011   Social History   Occupational History  . Not on file  Tobacco Use  . Smoking status: Never Smoker  . Smokeless tobacco: Never Used  Substance and Sexual Activity  . Alcohol use: No  . Drug use: No  . Sexual activity: Not Currently    Partners: Male    Birth control/protection: Post-menopausal, Surgical    Comment: hysterectomy

## 2020-04-01 ENCOUNTER — Encounter: Payer: Self-pay | Admitting: Internal Medicine

## 2020-04-01 DIAGNOSIS — S32409A Unspecified fracture of unspecified acetabulum, initial encounter for closed fracture: Secondary | ICD-10-CM | POA: Insufficient documentation

## 2020-04-01 DIAGNOSIS — I10 Essential (primary) hypertension: Secondary | ICD-10-CM | POA: Insufficient documentation

## 2020-04-01 DIAGNOSIS — S32601A Unspecified fracture of right ischium, initial encounter for closed fracture: Secondary | ICD-10-CM | POA: Insufficient documentation

## 2020-04-01 DIAGNOSIS — E039 Hypothyroidism, unspecified: Secondary | ICD-10-CM | POA: Insufficient documentation

## 2020-04-04 ENCOUNTER — Non-Acute Institutional Stay (SKILLED_NURSING_FACILITY): Payer: Medicare Other | Admitting: Internal Medicine

## 2020-04-04 DIAGNOSIS — E871 Hypo-osmolality and hyponatremia: Secondary | ICD-10-CM

## 2020-04-04 LAB — BASIC METABOLIC PANEL
BUN: 17 (ref 4–21)
CO2: 20 (ref 13–22)
Chloride: 89 — AB (ref 99–108)
Creatinine: 0.5 (ref 0.5–1.1)
Glucose: 93
Potassium: 4.7 (ref 3.4–5.3)
Sodium: 121 — AB (ref 137–147)

## 2020-04-04 LAB — COMPREHENSIVE METABOLIC PANEL
Calcium: 9 (ref 8.7–10.7)
GFR calc Af Amer: >90
GFR calc non Af Amer: 89.37

## 2020-04-04 LAB — CBC: RBC: 4.56 (ref 3.87–5.11)

## 2020-04-04 LAB — CBC AND DIFFERENTIAL
HCT: 44 (ref 36–46)
Hemoglobin: 15.1 (ref 12.0–16.0)
Platelets: 312 (ref 150–399)
WBC: 7.9

## 2020-04-06 ENCOUNTER — Other Ambulatory Visit: Payer: Self-pay | Admitting: *Deleted

## 2020-04-06 NOTE — Patient Outreach (Signed)
Late entry for 04/05/20  Screened for potential [facility] Care Management needs as a benefit of  [employer] Medicare.  Member is receiving skilled therapy at [facility].   Writer attended telephonic interdisciplinary team meeting to assess for disposition needs and transition plan for resident.   Facility reports member is max/dependent with therapy. She required treatment for hyponatremia. Transition plans pending. Lives alone and no family support.   Will continue to follow while member resides in SNF.    Marthenia Rolling, MSN-Ed, RN,BSN [place] Acute Care Coordinator [phone] [facility]) [phone]  (Toll free office)

## 2020-04-07 ENCOUNTER — Ambulatory Visit: Payer: Medicare Other | Admitting: Podiatry

## 2020-04-07 NOTE — Progress Notes (Signed)
Location:  [facility] Number: 105-P Place of Service:  SNF ([phone])  [name], MD  Patient Care Team: [name], MD as PCP - General (Family Medicine) Leonie Man, MD as PCP - Cardiology (Cardiology)  Extended Emergency Contact Information Primary Emergency Contact: [place] Phone: [phone] Relation: Daughter Secondary Emergency Contact: Stephani [place] Phone: [phone] Relation: Son    Allergies: Sulfasalazine, Aspirin, Penicillins, and Sulfa antibiotics  Chief Complaint  Patient presents with  . Acute Visit    Patient seen for hyponatremia    HPI: Patient is an 86 y.o. female who is being seen for hyponatremia seen on routine lab.  Sodium is 121 chloride 89.    Past Medical History:  Diagnosis Date  . Chronic venous insufficiency of lower extremity 01/25/2019   With chronic bilateral lower extremity edema  . Incontinent of urine   . Multiple sclerosis ([place]) 2015  . Osteoporosis    osteopenia  . Polyarthritis   . Post-menopausal   . Rheumatoid arthritis ([place])   . Temporal arteritis ([place]) 2011    Past Surgical History:  Procedure Laterality Date  . ABDOMINAL HYSTERECTOMY  1971   TVH  . breast ductectomy Right 05/1995  . COLONOSCOPY  10/2011    Allergies as of 04/04/2020      Reactions   Sulfasalazine Anaphylaxis   Aspirin Nausea Only   Penicillins Diarrhea   Sulfa Antibiotics       Medication List       Accurate as of April 04, 2020 11:59 PM. If you have any questions, ask your nurse or doctor.        acetaminophen 325 MG tablet Commonly known as: TYLENOL Take 650 mg by mouth every 8 (eight) hours. Scheduled   acetaminophen 325 MG tablet Commonly known as: TYLENOL Take 2 tablets (650 mg total) by mouth every 6 (six) hours as needed for mild pain (or Fever >/= 101).   azelastine 0.1 % nasal spray Commonly  known as: ASTELIN Place 1 spray into the nose 2 (two) times daily as needed for allergies. Use in each nostril as directed   Biotin 1 MG Caps Take 1 capsule by mouth daily.   bisacodyl 10 MG suppository Commonly known as: DULCOLAX Place 10 mg rectally daily as needed for moderate constipation.   CALCIUM + D PO Take 1 tablet by mouth 2 (two) times daily.   folic acid 1 MG tablet Commonly known as: FOLVITE Take 2 mg by mouth daily.   HYDROcodone-acetaminophen 5-325 MG tablet Commonly known as: NORCO/VICODIN Take 1 tablet by mouth every 6 (six) hours as needed for moderate pain.   ICAPS AREDS FORMULA PO Take 1 capsule by mouth daily.   KEVZARA [place] Inject into the skin every 14 (fourteen) days.   levothyroxine 25 MCG tablet Commonly known as: SYNTHROID Take 25 mcg by mouth daily.   lisinopril 10 MG tablet Commonly known as: ZESTRIL Take 1 tablet (10 mg total) by mouth daily.   Magnesium 500 MG Tabs Take 250 tablets by mouth every evening. 0.5 tablet   magnesium hydroxide 400 MG/5ML suspension Commonly known as: MILK OF MAGNESIA Take 30 mLs by mouth daily as needed for mild constipation.   methocarbamol 500 MG tablet Commonly known as: ROBAXIN Take 500 mg by mouth in the morning and at bedtime. Scheduled   methocarbamol 500 MG tablet  Commonly known as: ROBAXIN Take 1 tablet (500 mg total) by mouth every 8 (eight) hours as needed for muscle spasms.   methotrexate 2.5 MG tablet Take 20 mg by mouth every Thursday. Give 8 tablets to = 20 mg   polyethylene glycol 17 g packet Commonly known as: MIRALAX / GLYCOLAX Take 17 g by mouth daily.   predniSONE 1 MG tablet Commonly known as: DELTASONE Take 3 mg by mouth daily. Give 3 tablets to = 3 mg   Saline 0.9 % (Soln) Soln Cleanse left foot ulcer once daily.   senna-docusate 8.6-50 MG tablet Commonly known as: Senokot-S Take 1 tablet by mouth 2 (two) times daily.   sodium chloride 1 g tablet Take 1 g by mouth 3  (three) times daily with meals.   SODIUM PHOSPHATES RE Place 1 each rectally daily as needed.   Vitamin D (Ergocalciferol) 1.25 MG (50000 UNIT) Caps capsule Commonly known as: DRISDOL Take 1 capsule (50,000 Units total) by mouth every 7 (seven) days.       No orders of the defined types were placed in this encounter.   Immunization History  Administered Date(s) Administered  . DTaP 12/09/1994  . Influenza-Unspecified 08/10/2019  . PFIZER SARS-COV-2 Vaccination 01/16/2020, 02/10/2020    Social History   Tobacco Use  . Smoking status: Never Smoker  . Smokeless tobacco: Never Used  Substance Use Topics  . Alcohol use: No    Review of Systems : GENERAL:  no fevers, fatigue, appetite changes SKIN: No itching, rash HEENT: No complaint RESPIRATORY: No cough, wheezing, SOB CARDIAC: No chest pain, palpitations, lower extremity edema  GI: No abdominal pain, No N/V/D or constipation, No heartburn or reflux  GU: No dysuria, frequency or urgency, or incontinence  MUSCULOSKELETAL: No unrelieved bone/joint pain NEUROLOGIC: No headache, dizziness  PSYCHIATRIC: No overt anxiety or sadness  Vitals:   04/04/20 1631  BP: 130/78  Pulse: 74  Resp: 18  Temp: 98 F (36.7 C)  SpO2: 98%   Body mass index is 21.07 kg/m. Physical Exam  GENERAL APPEARANCE: Alert, conversant, No acute distress  SKIN: No diaphoresis rash HEENT: Unremarkable RESPIRATORY: Breathing is even, unlabored. Lung sounds are clear   CARDIOVASCULAR: Heart RRR no murmurs, rubs or gallops. No peripheral edema  GASTROINTESTINAL: Abdomen is soft, non-tender, not distended w/ normal bowel sounds.  GENITOURINARY: Bladder non tender, not distended  MUSCULOSKELETAL: No abnormal joints or musculature NEUROLOGIC: Cranial nerves 2-12 grossly intact. Moves all extremities PSYCHIATRIC: Mood and affect appropriate to situation, no behavioral issues  Patient Active Problem List   Diagnosis Date Noted  . Acetabular  fracture ([place]) 04/01/2020  . Closed right ischial fracture ([place]) 04/01/2020  . Hypertension 04/01/2020  . Hypothyroidism 04/01/2020  . Frequent falls 03/23/2020  . RA (rheumatoid arthritis) ([place]) 03/23/2020  . Tympanic membrane central perforation 12/15/2019  . Cholesteatoma of external auditory canal, right 09/10/2019  . Hearing loss 09/10/2019  . Chronic venous insufficiency of lower extremity 01/25/2019  . Grade II diastolic dysfunction [id number]  . Urinary dysfunction 12/22/2017  . Multiple sclerosis, primary progressive ([place]) 06/19/2017  . Left foot drop 03/17/2017  . Primary chronic progressive multiple sclerosis ([place]) 03/17/2017  . Myelopathy ([place]) 03/17/2017  . Bilateral lower extremity edema 03/17/2017    CMP     Component Value Date/Time   NA 137 03/23/2020 0805   K 3.9 03/23/2020 0805   CL 104 03/23/2020 0805   CO2 25 03/23/2020 0805   GLUCOSE 100 (H) 03/23/2020 0805   BUN 20  03/23/2020 0805   CREATININE 0.63 03/23/2020 0805   CREATININE 0.82 02/23/2019 1444   CALCIUM 8.8 (L) 03/23/2020 0805   PROT 5.4 (L) 03/23/2020 0805   ALBUMIN 3.7 03/23/2020 0805   AST 50 (H) 03/23/2020 0805   AST 27 02/23/2019 1444   ALT 33 03/23/2020 0805   ALT 27 02/23/2019 1444   ALKPHOS 56 03/23/2020 0805   BILITOT 1.0 03/23/2020 0805   BILITOT 0.4 02/23/2019 1444   GFRNONAA >60 03/23/2020 0805   GFRNONAA >60 02/23/2019 1444   GFRAA >60 03/23/2020 0805   GFRAA >60 02/23/2019 1444   Recent Labs    03/22/20 2229 03/23/20 0805  NA 134* 137  K 4.0 3.9  CL 100 104  CO2 25 25  GLUCOSE 108* 100*  BUN 26* 20  CREATININE 0.63 0.63  CALCIUM 9.7 8.8*   Recent Labs    03/23/20 0805  AST 50*  ALT 33  ALKPHOS 56  BILITOT 1.0  PROT 5.4*  ALBUMIN 3.7   Recent Labs    03/22/20 2229  WBC 6.8  NEUTROABS 2.7  HGB 13.5  HCT 40.0  MCV 98.5  PLT 150   No results for input(s): CHOL, LDLCALC, TRIG in the last 8760 hours.  Invalid input(s): HCL No results found for:  MICROALBUR Lab Results  Component Value Date   TSH 4.716 (H) 03/23/2020   No results found for: HGBA1C No results found for: CHOL, HDL, LDLCALC, LDLDIRECT, TRIG, CHOLHDL  Significant Diagnostic Results in last 30 days:  DG Chest 1 View  Result Date: 03/22/2020 CLINICAL DATA:  Multiple falls with right hip pain EXAM: CHEST  1 VIEW COMPARISON:  12/08/2018 FINDINGS: Mild cardiomegaly.  No consolidation or effusion.  No pneumothorax. IMPRESSION: No active disease.  Mild cardiomegaly. Electronically Signed   By: Donavan Foil M.D.   On: 03/22/2020 23:03   MR Lumbar Spine W Wo Contrast  Result Date: 03/23/2020 CLINICAL DATA:  Multiple sclerosis with bilateral leg weakness. Frequent falls. Severe back pain. EXAM: MRI LUMBAR SPINE WITHOUT AND WITH CONTRAST TECHNIQUE: Multiplanar and multiecho pulse sequences of the lumbar spine were obtained without and with intravenous contrast. CONTRAST:  71mL GADAVIST GADOBUTROL 1 MMOL/ML IV SOLN COMPARISON:  None FINDINGS: Segmentation:  Standard. Alignment:  Physiologic. Vertebrae:  No fracture, evidence of discitis, or bone lesion. Conus medullaris and cauda equina: Conus extends to the L1 level. Conus and cauda equina appear normal. Paraspinal and other soft tissues: Negative Disc levels: L1-L2: Normal disc space and facet joints. There is no spinal canal stenosis. No neural foraminal stenosis. L2-L3: Moderate facet hypertrophy with mild diffuse disc bulge. There is no spinal canal stenosis. No neural foraminal stenosis. L3-L4: Mild disc bulge. There is no spinal canal stenosis. No neural foraminal stenosis. L4-L5: Moderate facet hypertrophy and mild disc bulge. Sclerotic focus in the L4 vertebral body. There is no spinal canal stenosis. No neural foraminal stenosis. L5-S1: Normal disc space and facet joints. There is no spinal canal stenosis. No neural foraminal stenosis. Visualized sacrum: Normal. IMPRESSION: 1. No acute abnormality of the lumbar spine. 2. Mild  multilevel degenerative disc disease without spinal canal or neural foraminal stenosis. 3. Moderate facet arthrosis at L2-3 and L4-5. Electronically Signed   By: Ulyses Jarred M.D.   On: 03/23/2020 20:57   CT HIP RIGHT WO CONTRAST  Result Date: 03/15/2020 CLINICAL DATA:  Right hip pain for 2-3 days EXAM: CT OF THE RIGHT HIP WITHOUT CONTRAST TECHNIQUE: Multidetector CT imaging of the right hip was performed  according to the standard protocol. Multiplanar CT image reconstructions were also generated. COMPARISON:  X-ray 03/15/2020 FINDINGS: Bones/Joint/Cartilage Subtle linear lucency within the subchondral portion of the acetabular roof seen only on axial imaging (series 3, images 14-15) which could reflect a prominent vascular channel versus a nondisplaced fracture. Osseous structures appear otherwise intact. No additional fracture. No dislocation. Mild-to-moderate hip joint space narrowing posteriorly with posterior acetabular subchondral cystic change. No evidence of femoral head AVN. No suspicious lytic or sclerotic bone lesion. No appreciable hip joint effusion. Ligaments Suboptimally assessed by CT. Muscles and Tendons Preserved muscle bulk. Tendinous structures appear grossly intact within the limitations of CT. Soft tissues No soft tissue fluid collection or hematoma. No right inguinal lymphadenopathy. IMPRESSION: 1. Subtle linear lucency within the subchondral portion of the acetabular roof seen only on axial imaging which could reflect a prominent vascular channel versus a nondisplaced fracture. Findings could be further evaluated with MRI, as clinically indicated. 2. Osseous structures appear otherwise intact. 3. Degenerative changes of the right hip most pronounced posteriorly. These results will be called to the ordering clinician or representative by the Radiologist Assistant, and communication documented in the PACS or [employer]. Electronically Signed   By: Davina Poke D.O.   On:  03/15/2020 12:13   CT HIP RIGHT WO CONTRAST  Result Date: 03/15/2020 CLINICAL DATA:  Injured right hip 2-3 days ago. Severe hip pain. EXAM: CT OF THE RIGHT HIP WITHOUT CONTRAST TECHNIQUE: Multidetector CT imaging of the right hip was performed according to the standard protocol. Multiplanar CT image reconstructions were also generated. COMPARISON:  Radiographs, same date. FINDINGS: The right hip is normally located. No acute hip fracture or AVN. Mild to moderate age related degenerative changes with joint space narrowing and mild spurring. No obvious joint effusion. The visualized right hemipelvis appears intact. No pubic rami are acetabular fractures. Chondrocalcinosis noted at the pubic symphysis. No obvious muscle tear or intramuscular hematoma. No subcutaneous lesions or hematoma. No significant intrapelvic abnormalities are identified. No inguinal mass or hernia. IMPRESSION: 1. No acute hip fracture or AVN. 2. Mild to moderate age related degenerative changes but no obvious joint effusion. 3. No obvious muscle injury or hematoma. 4. If symptoms persist or worsen MRI may be helpful for further evaluation. Electronically Signed   By: Marijo Sanes M.D.   On: 03/15/2020 12:11   MR HIP RIGHT WO CONTRAST  Result Date: 03/23/2020 CLINICAL DATA:  Right hip pain after fall 7 doses. Inability to ambulate EXAM: MR OF THE RIGHT HIP WITHOUT CONTRAST TECHNIQUE: Multiplanar, multisequence MR imaging was performed. No intravenous contrast was administered. COMPARISON:  X-ray 03/22/2020 FINDINGS: Technical note: Motion degraded exam. Bones: Acute fracture of the central portion of the acetabular roof with 4 mm of articular-surface diastasis (series 7, image 9; series 9, images 10-12). There is an additional nondisplaced fracture involving the posterior acetabulum (series 7, images 9-12). Marrow edema anteriorly at the level of the AIIS. Nondisplaced fracture of the right ischium extending to the ischial tuberosity  (series 4 and 5, image 5). Proximal right femur is intact without fracture or dislocation. No femoral head AVN. No additional fractures. Pubic symphysis and SI joints intact without diastasis. Articular cartilage and labrum Articular cartilage: Mild-to-moderate chondral thinning and surface irregularity. Labrum: Degeneration and tearing of the superior and anterosuperior labrum. Joint or bursal effusion Joint effusion:  Small-moderate joint effusion. Bursae: No abnormal bursal fluid collection. Muscles and tendons Muscles and tendons: Intramuscular and soft tissue edema surrounding the right hip and  hemipelvis most pronounced within the right iliacus, right gluteus minimus, and right rectus femoris muscles. Tendons are intact without tear. Other findings Miscellaneous:   None. IMPRESSION: 1. Acute fracture of the central portion of the acetabular roof with 4 mm of articular-surface diastasis. 2. Acute nondisplaced fracture of the posterior acetabulum. 3. Acute nondisplaced fracture of the right ischium extending to the ischial tuberosity. 4. Right proximal femur intact without fracture or dislocation. 5. Small to moderate right hip joint effusion, likely reactive. These results will be called to the ordering clinician or representative by the Radiologist Assistant, and communication documented in the PACS or [employer]. Electronically Signed   By: Davina Poke D.O.   On: 03/23/2020 08:03   DG Hip Unilat W or Wo Pelvis 2-3 Views Right  Result Date: 03/22/2020 CLINICAL DATA:  Multiple fall EXAM: DG HIP (WITH OR WITHOUT PELVIS) 2-3V RIGHT COMPARISON:  03/20/2020 FINDINGS: SI joints are non widened. Pubic symphysis is intact. Old left superior and inferior pubic rami fractures. Bones appear osteopenic. No acute displaced fracture or malalignment. IMPRESSION: No acute osseous abnormality. CT or MRI follow-up may be pursued if continued concern for hip fracture. Old left pubic rami fractures. Electronically  Signed   By: Donavan Foil M.D.   On: 03/22/2020 23:03   XR HIP UNILAT W OR W/O PELVIS 2-3 VIEWS RIGHT  Result Date: 03/30/2020 2 view radiographs of the right hip shows a anterior wall and posterior column fracture with protrusio of the femoral head within the acetabulum  XR HIP UNILAT W OR W/O PELVIS 2-3 VIEWS RIGHT  Result Date: 03/24/2020 2 view radiographs of the right hip shows no evidence of an intertrochanteric or femoral neck fracture.  There is no displaced pubic rami or acetabular fracture.  XR HIP UNILAT W OR W/O PELVIS 2-3 VIEWS RIGHT  Result Date: 03/15/2020 Radiographs of the right hip without evidence of acute fracture.  Joint space well preserved   Assessment and Plan  Hyponatremia-sodium 121, chloride 89, CO2 29, potassium 4.7, BUN 17.5, creatinine 0.48; I reviewed all medications and it does not appear that the medications are causing the problem.  There is a very rare side effect of lisinopril which patient has been on for a long time causing SIADH so we will stop lisinopril but I doubt that the problem.  Have ordered sodium 1 g 3 times daily with a BMP on Monday 5/3.   Time spent greater than 35 minutes;> 50% of time with patient was spent reviewing records, labs, tests and studies, counseling and developing plan of care   Hennie Duos, MD

## 2020-04-09 ENCOUNTER — Encounter: Payer: Self-pay | Admitting: Internal Medicine

## 2020-04-09 DIAGNOSIS — E871 Hypo-osmolality and hyponatremia: Secondary | ICD-10-CM | POA: Insufficient documentation

## 2020-04-10 LAB — BASIC METABOLIC PANEL
BUN: 12 (ref 4–21)
CO2: 20 (ref 13–22)
Chloride: 90 — AB (ref 99–108)
Creatinine: 0.4 — AB (ref 0.5–1.1)
Glucose: 91
Potassium: 4.4 (ref 3.4–5.3)
Sodium: 123 — AB (ref 137–147)

## 2020-04-10 LAB — COMPREHENSIVE METABOLIC PANEL
Calcium: 8.5 — AB (ref 8.7–10.7)
GFR calc Af Amer: >90
GFR calc non Af Amer: >90

## 2020-04-17 ENCOUNTER — Encounter (HOSPITAL_COMMUNITY): Payer: Medicare Other

## 2020-04-17 ENCOUNTER — Telehealth: Payer: Self-pay | Admitting: Orthopedic Surgery

## 2020-04-17 NOTE — Telephone Encounter (Signed)
Please see below.

## 2020-04-17 NOTE — Telephone Encounter (Signed)
Patient called with questions about her bones. Patient asked for Dr. Sharol Given to give her a call.  Please call patient at [phone].

## 2020-04-18 NOTE — Telephone Encounter (Signed)
I called, recommended 3 0 grams of protein 2 x a day

## 2020-04-20 ENCOUNTER — Telehealth: Payer: Self-pay | Admitting: Family Medicine

## 2020-04-20 ENCOUNTER — Other Ambulatory Visit: Payer: Self-pay | Admitting: *Deleted

## 2020-04-20 NOTE — Patient Outreach (Signed)
Late entry for 04/19/20  Screened for potential [name] Care Management needs as a benefit of  [employer].  Member is receiving skilled therapy at [facility].   Writer attended telephonic interdisciplinary team meeting to assess for disposition needs and transition plan for resident.   Facility reports member was independent PTA. States daughter does not live locally and is trying to figure out best course of action regarding transition plans. Member is not making much progress. Follow up ortho appt is 04/27/20.  Will plan outreach to discuss transition plans with daughter. Will collaborate with facility SW prior to outreach.   Marthenia Rolling, MSN-Ed, RN,BSN [place] Acute Care Coordinator [phone] [facility]) [phone]  (Toll free office)

## 2020-04-20 NOTE — Telephone Encounter (Signed)
Pt called stating that she had a fall and broke her hip and now she is having muscle spasms in her hip and she would like to know how much does this has to do with her MS. Please advise.

## 2020-04-21 ENCOUNTER — Non-Acute Institutional Stay (SKILLED_NURSING_FACILITY): Payer: Medicare Other | Admitting: Internal Medicine

## 2020-04-21 ENCOUNTER — Encounter: Payer: Self-pay | Admitting: Internal Medicine

## 2020-04-21 DIAGNOSIS — E871 Hypo-osmolality and hyponatremia: Secondary | ICD-10-CM

## 2020-04-21 DIAGNOSIS — M069 Rheumatoid arthritis, unspecified: Secondary | ICD-10-CM | POA: Diagnosis not present

## 2020-04-21 DIAGNOSIS — I10 Essential (primary) hypertension: Secondary | ICD-10-CM

## 2020-04-21 DIAGNOSIS — S32601S Unspecified fracture of right ischium, sequela: Secondary | ICD-10-CM

## 2020-04-21 DIAGNOSIS — E034 Atrophy of thyroid (acquired): Secondary | ICD-10-CM | POA: Diagnosis not present

## 2020-04-21 DIAGNOSIS — M810 Age-related osteoporosis without current pathological fracture: Secondary | ICD-10-CM | POA: Diagnosis not present

## 2020-04-21 DIAGNOSIS — S32401S Unspecified fracture of right acetabulum, sequela: Secondary | ICD-10-CM

## 2020-04-21 DIAGNOSIS — G35 Multiple sclerosis: Secondary | ICD-10-CM

## 2020-04-21 NOTE — Progress Notes (Signed)
Patient ID: [name], female   DOB: Jul 31, 1932, 86 y.o.   MRN: [id number]  Location:  [facility] Room Number: [place] of Service:  SNF (31) Provider:  Sherill Mangen L. Mariea Clonts, D.O., C.M.D.  [name], MD  Patient Care Team: [name], MD as PCP - General (Family Medicine) Leonie Man, MD as PCP - Cardiology (Cardiology)  Extended Emergency Contact Information Primary Emergency Contact: [place] of [place] Phone: [phone] Relation: Daughter Secondary Emergency Contact: [place] of [place] Phone: [phone] Relation: Son  Code Status:  DNR  Goals of care: Advanced Directive information Advanced Directives 04/21/2020  Does Patient Have a Medical Advance Directive? Yes  Type of Advance Directive Out of facility DNR (pink MOST or yellow form)  Does patient want to make changes to medical advance directive? No - Patient declined  Copy of [place] in Chart? -  Would patient like information on creating a medical advance directive? -  Pre-existing out of facility DNR order (yellow form or pink MOST form) -     Chief Complaint  Patient presents with  . Acute Visit    Muscle spasms and hyponatremia     HPI:  Pt is a 86 y.o. female seen today for an acute visit for muscle spasms and hyponatremia.  She is a very pleasant lady with h/o MS and RA who had been living independently with many dear neighbors and friends supporting her.  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   She is here at [facility] for short-term rehab s/p fall with right acetabular fx, nondisplaced fx posterior acetabulum, right ischium extending into ischial tuberosity, and small to moderate right hip joint effusion (noted on MRI 4/15 after her last fall in her bathroom from which she could not get up on  her own and laid on the floor until her neighbor came over--had several falls before without any fxs seen on xrays or CTs earlier last month).  Dr. Sharol Given is following her and has been her orthopedic surgeon long-term.  She is getting PT here and her nurse and the PT came to me with concerns about muscle spasms that are severe and limit her ability to do her therapy and be independent.  They come in her right groin and are debilitating in the early morning when she is stiff and trying to get started.    Past Medical History:  Diagnosis Date  . Chronic venous insufficiency of lower extremity 01/25/2019   With chronic bilateral lower extremity edema  . Incontinent of urine   . Multiple sclerosis ([place]) 2015  . Osteoporosis    osteopenia  . Polyarthritis   . Post-menopausal   . Rheumatoid arthritis ([place])   . Temporal arteritis ([place]) 2011   Past Surgical History:  Procedure Laterality Date  . ABDOMINAL HYSTERECTOMY  1971   TVH  . breast ductectomy Right 05/1995  . COLONOSCOPY  10/2011    Allergies  Allergen Reactions  . Sulfasalazine Anaphylaxis  . Aspirin Nausea Only  . Penicillins Diarrhea  . Sulfa Antibiotics     Outpatient Encounter Medications as of 04/21/2020  Medication Sig  . acetaminophen (TYLENOL) 325 MG tablet Take 650 mg by mouth every 6 (six) hours as needed.  Marland Kitchen acetaminophen (TYLENOL) 325 MG tablet Take 650  mg by mouth every 8 (eight) hours as needed.  . bisacodyl (DULCOLAX) 10 MG suppository Place 10 mg rectally daily as needed for moderate constipation.  . folic acid (FOLVITE) 1 MG tablet Take 2 mg by mouth daily.   Marland Kitchen HYDROcodone-acetaminophen (NORCO/VICODIN) 5-325 MG tablet Take 1 tablet by mouth every 6 (six) hours as needed for moderate pain.  Marland Kitchen levothyroxine (SYNTHROID, LEVOTHROID) 25 MCG tablet Take 25 mcg by mouth daily.  Marland Kitchen lisinopril (ZESTRIL) 10 MG tablet Take 1 tablet (10 mg total) by mouth daily.  . Magnesium 500 MG TABS Take 250 tablets by mouth every  evening. 0.5 tablet  . magnesium hydroxide (MILK OF MAGNESIA) 400 MG/5ML suspension Take 30 mLs by mouth daily as needed for mild constipation.  . methocarbamol (ROBAXIN) 500 MG tablet Take 1 tablet (500 mg total) by mouth every 8 (eight) hours as needed for muscle spasms.  . methocarbamol (ROBAXIN) 500 MG tablet Take 500 mg by mouth in the morning and at bedtime. Scheduled  . methotrexate 2.5 MG tablet Take 20 mg by mouth every Thursday. Give 8 tablets to = 20 mg  . Multiple Vitamins-Minerals (ICAPS AREDS FORMULA PO) Take 1 capsule by mouth daily.   . polyethylene glycol (MIRALAX / GLYCOLAX) 17 g packet Take 17 g by mouth daily.  . predniSONE (DELTASONE) 1 MG tablet Take 3 mg by mouth daily. Give 3 tablets to = 3 mg  . Saline 0.9 % (Soln) SOLN Cleanse left foot ulcer once daily.  Marland Kitchen Sarilumab (KEVZARA [place]) Inject into the skin every 14 (fourteen) days.  Marland Kitchen senna-docusate (SENOKOT-S) 8.6-50 MG tablet Take 1 tablet by mouth 2 (two) times daily.  . sodium chloride 1 g tablet Take 1 g by mouth 3 (three) times daily with meals.  . SODIUM PHOSPHATES RE Place 1 each rectally daily as needed.  . [DISCONTINUED] acetaminophen (TYLENOL) 325 MG tablet Take 2 tablets (650 mg total) by mouth every 6 (six) hours as needed for mild pain (or Fever >/= 101).  . [DISCONTINUED] acetaminophen (TYLENOL) 325 MG tablet Take 650 mg by mouth every 8 (eight) hours. Scheduled  . [DISCONTINUED] azelastine (ASTELIN) 137 MCG/SPRAY nasal spray Place 1 spray into the nose 2 (two) times daily as needed for allergies. Use in each nostril as directed   . [DISCONTINUED] Biotin 1 MG CAPS Take 1 capsule by mouth daily.   . [DISCONTINUED] Calcium Carbonate-Vitamin D (CALCIUM + D PO) Take 1 tablet by mouth 2 (two) times daily.   . [DISCONTINUED] Vitamin D, Ergocalciferol, (DRISDOL) 50000 UNITS CAPS capsule Take 1 capsule (50,000 Units total) by mouth every 7 (seven) days.   No facility-administered encounter medications on file as of  04/21/2020.    Review of Systems  Constitutional: Positive for malaise/fatigue. Negative for chills and fever.  HENT: Negative for congestion and sore throat.   Eyes: Negative for blurred vision.  Respiratory: Negative for shortness of breath.   Cardiovascular: Negative for chest pain, palpitations and leg swelling.  Gastrointestinal: Negative for abdominal pain.  Genitourinary: Negative for dysuria.  Musculoskeletal: Positive for falls, joint pain and myalgias.  Skin: Negative for itching and rash.  Neurological: Positive for tingling, sensory change and weakness.       MS  Psychiatric/Behavioral: Negative for Gabriela loss.    Immunization History  Administered Date(s) Administered  . DTaP 12/09/1994  . Influenza-Unspecified 08/10/2019  . PFIZER SARS-COV-2 Vaccination 01/16/2020, 02/10/2020   Pertinent  Health Maintenance Due  Topic Date Due  . PNA vac Low Risk  Adult (1 of 2 - PCV13) Never done  . INFLUENZA VACCINE  07/09/2020  . DEXA SCAN  Completed   Fall Risk  04/23/2020  Risk for fall due to : History of fall(s);Impaired balance/gait;Impaired mobility;Medication side effect;Orthopedic patient  Follow up Falls evaluation completed;Education provided;Falls prevention discussed  Comment at [place]:    Vitals:   04/21/20 1325  BP: 125/74  Pulse: 71  Temp: 97.7 F (36.5 C)  Weight: 126 lb 9.6 oz (57.4 kg)  Height: 5\' 5"  (1.651 m)   Body mass index is 21.07 kg/m. Physical Exam Vitals reviewed.  Constitutional:      General: She is not in acute distress.    Appearance: Normal appearance. She is not toxic-appearing.     Comments: Thin female seated in manual wheelchair  HENT:     Head: Normocephalic and atraumatic.     Right Ear: External ear normal.     Left Ear: External ear normal.     Nose: Nose normal.     Mouth/Throat:     Pharynx: Oropharynx is clear.  Eyes:     Extraocular Movements: Extraocular movements intact.      Pupils: Pupils are equal, round, and reactive to light.  Cardiovascular:     Rate and Rhythm: Normal rate and regular rhythm.     Pulses: Normal pulses.     Heart sounds: Normal heart sounds.  Abdominal:     General: Bowel sounds are normal.     Palpations: Abdomen is soft.     Tenderness: There is no abdominal tenderness.  Musculoskeletal:        General: Tenderness present.     Cervical back: Neck supple.     Right lower leg: No edema.     Left lower leg: No edema.  Skin:    General: Skin is warm and dry.     Comments: Right groin and hip tenderness  Neurological:     General: No focal deficit present.     Mental Status: She is alert and oriented to person, place, and time.     Cranial Nerves: No cranial nerve deficit.     Sensory: Sensory deficit present.     Motor: Weakness present.     Gait: Gait abnormal.  Psychiatric:        Mood and Affect: Mood normal.        Behavior: Behavior normal.     Labs reviewed: Recent Labs    03/22/20 2229 03/23/20 0805  NA 134* 137  K 4.0 3.9  CL 100 104  CO2 25 25  GLUCOSE 108* 100*  BUN 26* 20  CREATININE 0.63 0.63  CALCIUM 9.7 8.8*   Recent Labs    03/23/20 0805  AST 50*  ALT 33  ALKPHOS 56  BILITOT 1.0  PROT 5.4*  ALBUMIN 3.7   Recent Labs    03/22/20 2229  WBC 6.8  NEUTROABS 2.7  HGB 13.5  HCT 40.0  MCV 98.5  PLT 150   Lab Results  Component Value Date   TSH 4.716 (H) 03/23/2020   No results found for: HGBA1C No results found for: CHOL, HDL, LDLCALC, LDLDIRECT, TRIG, CHOLHDL  Significant Diagnostic Results in last 30 days:  XR HIP UNILAT W OR W/O PELVIS 2-3 VIEWS RIGHT  Result Date: 03/30/2020 2 view radiographs of the right hip shows a anterior wall and posterior column fracture with protrusio of the femoral head within the acetabulum   Assessment/Plan 1. Closed nondisplaced  fracture of right acetabulum, unspecified portion of acetabulum, sequela -here for PT, OT s/p multiple falls the last of  which she could not get up from -Recommended life alert button or similar -Keep ortho f/u 5/20 as planned with Dr. Sharol Given. Marya Amsler going to try to optimize her muscle relaxers so she can do her therapy better without spasms and make more progress to get home  2. Closed nondisplaced fracture of right ischium, unspecified fracture morphology, sequela -cont current therapy   3. Multiple sclerosis, primary progressive ([place]) -was having falls related to that  4. Hyponatremia -recheck bmp and liberalize diet.  Then we'll see if she still needs so much sodium  5. Rheumatoid arthritis, involving unspecified site, unspecified whether rheumatoid factor present ([place]) -cont every other week monoclonal ab therapy and prednisone therapy -also on methotrexate Needs cbc and liver monitored with RA med  6. Essential hypertension -bp is controlled  7. Hypothyroidism due to acquired atrophy of thyroid -cont levothyroxine therapy  8.  Senile osteoporosis:  Is on prolia injections for osteoporosis outpatient and has been on weekly vitamin D  Reports she may need to go home soon and continue therapy there with increased caregiver support if insurance quits paying    She's interested in establishing at [facility] when she leaves here to be followed by a geriatrician and to be in [facility] with her specialists  Family/ staff Communication: discussed with snf nurse  Labs/tests ordered:  F/u bmp due to sodium tablet addition and change in diet  Spent at least 45 mins with patient today (first visit with her)  [place]. Vasilios Ottaway, D.O. [facility] [address], [place] [address] Cell Phone (Mon-Fri 8am-5pm):  [phone] On Call:  [phone] & follow prompts after 5pm & weekends Office Phone:  [phone] Office Fax:  [phone]

## 2020-04-24 ENCOUNTER — Other Ambulatory Visit: Payer: Self-pay | Admitting: *Deleted

## 2020-04-24 ENCOUNTER — Encounter: Payer: Self-pay | Admitting: Internal Medicine

## 2020-04-24 LAB — BASIC METABOLIC PANEL
BUN: 15 (ref 4–21)
CO2: 24 — AB (ref 13–22)
Chloride: 96 — AB (ref 99–108)
Creatinine: 0.5 (ref 0.5–1.1)
Glucose: 85
Potassium: 4.7 (ref 3.4–5.3)
Sodium: 130 — AB (ref 137–147)

## 2020-04-24 LAB — COMPREHENSIVE METABOLIC PANEL
Calcium: 9.4 (ref 8.7–10.7)
GFR calc Af Amer: >90
GFR calc non Af Amer: >90

## 2020-04-24 NOTE — Telephone Encounter (Signed)
I called pt and she is in [facility] re[facility] facility in [place].  She has had fall and cracked her hip, she is there for  Rehab, (she lives alone her daughter and son in different states.  SW and OT are discussing other living options for her.  She asked about musle spasms (taking robaxin, I see ordered) and she states she is and is helping.  She will confer with them to make sure on something for this.  Will call back needed.

## 2020-04-24 NOTE — Patient Outreach (Signed)
[facility] Post- Acute Care Coordinator follow up  [name] remains at [facility]. Update received from facility SW indicating plans are for member to return home at [facility] discharge. Member mostly max assist with therapy.   Facility SW indicates primary contact is daughter/DPR [name]  [phone].  Telephone call made daughter/DPR [name] [phone]. No answer. HIPAA compliant voicemail message left requesting return call.   Will plan outreach again at later time to discuss transition plans and potential [facility] needs.    Marthenia Rolling, MSN-Ed, RN,BSN [facility] Acute Care Coordinator [phone] [facility]) [phone]  (Toll free office)

## 2020-04-24 NOTE — Progress Notes (Signed)
Location:    [facility] Room Number: 105/P Place of Service:  SNF (31) Provider:  Montez Morita, MD  Patient Care Team: [name], MD as PCP - General (Family Medicine) Leonie Man, MD as PCP - Cardiology (Cardiology)  Extended Emergency Contact Information Primary Emergency Contact: [place] of [place] Phone: [phone] Relation: Daughter Secondary Emergency Contact: [place] of [place] Phone: [phone] Relation: Son  Code Status:  DNR Goals of care: Advanced Directive information Advanced Directives 04/24/2020  Does Patient Have a Medical Advance Directive? Yes  Type of Advance Directive Out of facility DNR (pink MOST or yellow form)  Does patient want to make changes to medical advance directive? No - Patient declined  Copy of [place] in Chart? -  Would patient like information on creating a medical advance directive? -  Pre-existing out of facility DNR order (yellow form or pink MOST form) Yellow form placed in chart (order not valid for inpatient use)     Chief Complaint  Patient presents with  . Medical Management of Chronic Issues    Routine visit of medical mangement    HPI:  Pt is a 86 y.o. female seen today for medical management of chronic diseases.     Past Medical History:  Diagnosis Date  . Chronic venous insufficiency of lower extremity 01/25/2019   With chronic bilateral lower extremity edema  . Incontinent of urine   . Multiple sclerosis ([place]) 2015  . Osteoporosis    osteopenia  . Polyarthritis   . Post-menopausal   . Rheumatoid arthritis ([place])   . Temporal arteritis ([place]) 2011   Past Surgical History:  Procedure Laterality Date  . ABDOMINAL HYSTERECTOMY  1971   TVH  . breast ductectomy Right 05/1995  . COLONOSCOPY  10/2011    Allergies  Allergen Reactions  . Sulfasalazine Anaphylaxis   . Aspirin Nausea Only  . Penicillins Diarrhea  . Sulfa Antibiotics     Allergies as of 04/24/2020      Reactions   Sulfasalazine Anaphylaxis   Aspirin Nausea Only   Penicillins Diarrhea   Sulfa Antibiotics       Medication List       Accurate as of Apr 24, 2020  3:28 PM. If you have any questions, ask your nurse or doctor.        STOP taking these medications   HYDROcodone-acetaminophen 5-325 MG tablet Commonly known as: NORCO/VICODIN Stopped by: Granville Lewis, PA-C   Saline 0.9 % (Soln) Soln Stopped by: Granville Lewis, PA-C     TAKE these medications   Azelastine HCl 137 MCG/SPRAY Soln Place 1 spray into both nostrils 2 (two) times daily as needed (As needed for allergies).   Biotin 10000 MCG Tabs Take 1,000 mcg by mouth daily.   bisacodyl 10 MG suppository Commonly known as: DULCOLAX Place 10 mg rectally daily as needed for moderate constipation.   Calcium-Vitamin D3 250-125 MG-UNIT Tabs Take 1 tablet by mouth 2 (two) times daily.   folic acid 1 MG tablet Commonly known as: FOLVITE Take 2 mg by mouth daily.   ICAPS AREDS FORMULA PO Take 1 capsule by mouth daily.   KEVZARA Allen Inject 200 mg into the skin. Once every other week   levothyroxine 25 MCG tablet Commonly known as: SYNTHROID Take 25 mcg by mouth daily.   lisinopril 10 MG tablet  Commonly known as: ZESTRIL Take 1 tablet (10 mg total) by mouth daily.   Magnesium 500 MG Tabs Take 250 tablets by mouth every evening. 0.5 tablet   magnesium hydroxide 400 MG/5ML suspension Commonly known as: MILK OF MAGNESIA Take 30 mLs by mouth daily as needed for mild constipation.   methocarbamol 500 MG tablet Commonly known as: ROBAXIN Take 500 mg by mouth every 8 (eight) hours as needed for muscle spasms.   methocarbamol 500 MG tablet Commonly known as: ROBAXIN Take 500 mg by mouth 3 (three) times daily. ( WITH 1ST ADMINISTRATION EARLY AM)   methotrexate 2.5 MG tablet Take 20 mg by mouth every Thursday.  Give 8 tablets to = 20 mg   NON FORMULARY DIET: LIBERALIZE DIET TO REGULAR   polyethylene glycol 17 g packet Commonly known as: MIRALAX / GLYCOLAX Take 17 g by mouth daily.   predniSONE 1 MG tablet Commonly known as: DELTASONE Take 3 mg by mouth daily. Give 3 tablets to = 3 mg   senna-docusate 8.6-50 MG tablet Commonly known as: Senokot-S Take 1 tablet by mouth 2 (two) times daily.   sodium chloride 1 g tablet Take 1 g by mouth 3 (three) times daily with meals.   SODIUM PHOSPHATES RE Place 1 each rectally daily as needed.   Systane Ultra 0.4-0.3 % Soln Generic drug: Polyethyl Glycol-Propyl Glycol Place 1 drop into both eyes 2 (two) times daily. For eye dryness   Tylenol 325 MG tablet Generic drug: acetaminophen Take 650 mg by mouth every 6 (six) hours as needed.   acetaminophen 325 MG tablet Commonly known as: TYLENOL Take 650 mg by mouth every 8 (eight) hours as needed.   Vitamin D (Ergocalciferol) 1.25 MG (50000 UNIT) Caps capsule Commonly known as: DRISDOL Take 50,000 Units by mouth every 7 (seven) days.       Review of Systems  Immunization History  Administered Date(s) Administered  . DTaP 12/09/1994  . Influenza-Unspecified 08/10/2019  . PFIZER SARS-COV-2 Vaccination 01/16/2020, 02/10/2020   Pertinent  Health Maintenance Due  Topic Date Due  . PNA vac Low Risk Adult (1 of 2 - PCV13) Never done  . INFLUENZA VACCINE  07/09/2020  . DEXA SCAN  Completed   Fall Risk  04/23/2020  Risk for fall due to : History of fall(s);Impaired balance/gait;Impaired mobility;Medication side effect;Orthopedic patient  Follow up Falls evaluation completed;Education provided;Falls prevention discussed  Comment at [place]:    Vitals:   04/24/20 1527  BP: 135/71  Pulse: 66  Resp: 18  Temp: 98 F (36.7 C)  TempSrc: Oral  SpO2: 98%  Weight: 124 lb (56.2 kg)  Height: 5\' 5"  (1.651 m)   Body mass index is 20.63 kg/m. Physical Exam   Labs reviewed: Recent Labs    03/22/20 2229 03/23/20 0805  NA 134* 137  K 4.0 3.9  CL 100 104  CO2 25 25  GLUCOSE 108* 100*  BUN 26* 20  CREATININE 0.63 0.63  CALCIUM 9.7 8.8*   Recent Labs    03/23/20 0805  AST 50*  ALT 33  ALKPHOS 56  BILITOT 1.0  PROT 5.4*  ALBUMIN 3.7   Recent Labs    03/22/20 2229  WBC 6.8  NEUTROABS 2.7  HGB 13.5  HCT 40.0  MCV 98.5  PLT 150   Lab Results  Component Value Date   TSH 4.716 (H) 03/23/2020   No results found for: HGBA1C No results found for: CHOL, HDL, LDLCALC, LDLDIRECT, TRIG,  CHOLHDL  Significant Diagnostic Results in last 30 days:  XR HIP UNILAT W OR W/O PELVIS 2-3 VIEWS RIGHT  Result Date: 03/30/2020 2 view radiographs of the right hip shows a anterior wall and posterior column fracture with protrusio of the femoral head within the acetabulum   Assessment/Plan There are no diagnoses linked to this encounter.   Family/ staff Communication:   Labs/tests ordered:      This encounter was created in error - please disregard.

## 2020-04-26 ENCOUNTER — Encounter: Payer: Self-pay | Admitting: Internal Medicine

## 2020-04-26 ENCOUNTER — Non-Acute Institutional Stay (SKILLED_NURSING_FACILITY): Payer: Medicare Other | Admitting: Internal Medicine

## 2020-04-26 DIAGNOSIS — S32401S Unspecified fracture of right acetabulum, sequela: Secondary | ICD-10-CM | POA: Diagnosis not present

## 2020-04-26 DIAGNOSIS — E034 Atrophy of thyroid (acquired): Secondary | ICD-10-CM | POA: Diagnosis not present

## 2020-04-26 DIAGNOSIS — I10 Essential (primary) hypertension: Secondary | ICD-10-CM

## 2020-04-26 DIAGNOSIS — M069 Rheumatoid arthritis, unspecified: Secondary | ICD-10-CM

## 2020-04-26 DIAGNOSIS — E871 Hypo-osmolality and hyponatremia: Secondary | ICD-10-CM

## 2020-04-26 NOTE — Progress Notes (Signed)
Location:    [facility] Room Number: 105/P Place of Service:  SNF (31) Provider:  Montez Morita, MD  Patient Care Team: [name], MD as PCP - General (Family Medicine) Leonie Man, MD as PCP - Cardiology (Cardiology)  Extended Emergency Contact Information Primary Emergency Contact: [place] Phone: [phone] Relation: Daughter Secondary Emergency Contact: Stephani Police [place] Phone: [phone] Relation: Son  Code Status:  DNR Goals of care: Advanced Directive information Advanced Directives 04/26/2020  Does Patient Have a Medical Advance Directive? Yes  Type of Advance Directive Out of facility DNR (pink MOST or yellow form)  Does patient want to make changes to medical advance directive? No - Patient declined  Copy of [place] in Chart? -  Would patient like information on creating a medical advance directive? -  Pre-existing out of facility DNR order (yellow form or pink MOST form) Yellow form placed in chart (order not valid for inpatient use)     Chief Complaint  Patient presents with  . Medical Management of Chronic Issues    Routine visit of medical management  Medical management of chronic medical conditions including history of recentpelvic fractures--rheumatoid arthritis-hypertension-hyponatremia and multiple sclerosis  HPI:  Pt is a 86 y.o. female seen today for medical management of chronic diseases.  As noted above.  Patient is here for rehab after sustaining pelvic fractures and a fall this included a right acetabular fracture and nondisplaced posterior acetabulum fracture right ischium fracture extending into the ischial tuberosity and a small to moderate right hip joint effusion.  She is followed by orthopedics and actually has an orthopedic follow-up scheduled for tomorrow.  She recently  complained of muscle spasms pains and Dr. Mariea Clonts did maximize her muscle relaxer she is on Robaxin 500 mg TID  She does complain of some pelvic pain and she hopes there is not been any displacement and she will see orthopedic tomorrow I suspect they will do a follow-up x-ray-her muscle relaxer also has been adjusted as noted above.  Erosive rheumatoid arthritis she is on Kevera 200 mg every other week.  She also has a order for methotrexate 20 mg q. weekly and prednisone 3 mg a day.  She does have a history of hypertension she is on lisinopril 10 mg a day this appears stable recent blood pressures 118/69-110/66-128/59.  She also has a history of hyponatremia with a sodium of 121 on April 27-she is on sodium chloride 1 g 3 times daily and this is gradually rising actually was up to 130 on lab done on May 17.    She also has a history of hypothyroidism she is on Synthroid.   She continues on supportive care for history of multiple sclerosis.  Apparently she had been pretty independent before her admission to the hospital.  Vital signs appear to be stable she still complains of some pelvic soreness and again will have orthopedic follow-up tomorrow   Past Medical History:  Diagnosis Date  . Chronic venous insufficiency of lower extremity 01/25/2019   With chronic bilateral lower extremity edema  . Incontinent of urine   . Multiple sclerosis ([place]) 2015  . Osteoporosis    osteopenia  . Polyarthritis   . Post-menopausal   . Rheumatoid arthritis ([place])   . Temporal arteritis ([place]) 2011   Past Surgical History:  Procedure Laterality  Date  . ABDOMINAL HYSTERECTOMY  1971   TVH  . breast ductectomy Right 05/1995  . COLONOSCOPY  10/2011    Allergies  Allergen Reactions  . Sulfasalazine Anaphylaxis  . Aspirin Nausea Only  . Penicillins Diarrhea  . Sulfa Antibiotics     Allergies as of 04/26/2020      Reactions   Sulfasalazine Anaphylaxis   Aspirin Nausea Only   Penicillins  Diarrhea   Sulfa Antibiotics       Medication List       Accurate as of Apr 26, 2020  2:25 PM. If you have any questions, ask your nurse or doctor.        Azelastine HCl 137 MCG/SPRAY Soln Place 1 spray into both nostrils 2 (two) times daily as needed (As needed for allergies).   Biotin 10000 MCG Tabs Take 1,000 mcg by mouth daily.   bisacodyl 10 MG suppository Commonly known as: DULCOLAX Place 10 mg rectally daily as needed for moderate constipation.   Calcium-Vitamin D3 250-125 MG-UNIT Tabs Take 1 tablet by mouth 2 (two) times daily.   folic acid 1 MG tablet Commonly known as: FOLVITE Take 2 mg by mouth daily.   ICAPS AREDS FORMULA PO Take 1 capsule by mouth daily.   KEVZARA [place] Inject 200 mg into the skin. Once every other week   levothyroxine 25 MCG tablet Commonly known as: SYNTHROID Take 25 mcg by mouth daily.   lisinopril 10 MG tablet Commonly known as: ZESTRIL Take 1 tablet (10 mg total) by mouth daily.   Magnesium 500 MG Tabs Take 250 tablets by mouth every evening. 0.5 tablet   magnesium hydroxide 400 MG/5ML suspension Commonly known as: MILK OF MAGNESIA Take 30 mLs by mouth daily as needed for mild constipation.   methocarbamol 500 MG tablet Commonly known as: ROBAXIN Take 500 mg by mouth every 8 (eight) hours as needed for muscle spasms.   methocarbamol 500 MG tablet Commonly known as: ROBAXIN Take 500 mg by mouth 3 (three) times daily. ( WITH 1ST ADMINISTRATION EARLY AM)   methotrexate 2.5 MG tablet Take 20 mg by mouth every Thursday. Give 8 tablets to = 20 mg   NON FORMULARY DIET: LIBERALIZE DIET TO REGULAR   polyethylene glycol 17 g packet Commonly known as: MIRALAX / GLYCOLAX Take 17 g by mouth daily.   predniSONE 1 MG tablet Commonly known as: DELTASONE Take 3 mg by mouth daily. Give 3 tablets to = 3 mg   senna-docusate 8.6-50 MG tablet Commonly known as: Senokot-S Take 1 tablet by mouth 2 (two) times daily.   sodium chloride  1 g tablet Take 1 g by mouth 3 (three) times daily with meals.   SODIUM PHOSPHATES RE Place 1 each rectally daily as needed.   Systane Ultra 0.4-0.3 % Soln Generic drug: Polyethyl Glycol-Propyl Glycol Place 1 drop into both eyes 2 (two) times daily. For eye dryness   Tylenol 325 MG tablet Generic drug: acetaminophen Take 650 mg by mouth every 6 (six) hours as needed.   acetaminophen 325 MG tablet Commonly known as: TYLENOL Take 650 mg by mouth every 8 (eight) hours as needed.   Vitamin D (Ergocalciferol) 1.25 MG (50000 UNIT) Caps capsule Commonly known as: DRISDOL Take 50,000 Units by mouth every 7 (seven) days.       Review of Systems   General she is not complain of any fever or chills.  Skin is not complain of rashes itching or diaphoresis.  Head ears eyes nose mouth  and throat is not complaining of visual changes or sore throat.  Respiratory does not complain of being short of breath or having a cough.  Cardiac does not complain of chest pain or increasing edema from baseline she does have a history of lymphedema.  GI does not complain of abdominal discomfort nausea vomiting diarrhea constipation.  GU is not complaining of dysuria.  Musculoskeletal does have a history of some pelvic pain recent fractures   Neurologic is not complaining of dizziness headache or syncope she does have a history of multiple sclerosis.  And psych is not complaining of being depressed or anxious.    Immunization History  Administered Date(s) Administered  . DTaP 12/09/1994  . Influenza-Unspecified 08/10/2019  . PFIZER SARS-COV-2 Vaccination 01/16/2020, 02/10/2020   Pertinent  Health Maintenance Due  Topic Date Due  . PNA vac Low Risk Adult (1 of 2 - PCV13) Never done  . INFLUENZA VACCINE  07/09/2020  . DEXA SCAN  Completed   Fall Risk  04/23/2020  Risk for fall due to : History of fall(s);Impaired balance/gait;Impaired mobility;Medication side effect;Orthopedic patient    Follow up Falls evaluation completed;Education provided;Falls prevention discussed  Comment at [place]:    Vitals:   04/26/20 1423  BP: 118/69  Pulse: 74  Resp: 18  Temp: 98 F (36.7 C)  TempSrc: Oral  SpO2: 98%  Weight: 124 lb (56.2 kg)  Height: 5\' 5"  (1.651 m)   Body mass index is 20.63 kg/m. Physical Exam   General this is a pleasant elderly female in no distress sitting comfortably in her wheelchair.  Her skin is warm and dry.  Eyes visual acuity appears to be intact sclera and conjunctive are clear.  Oropharynx is clear mucous membranes moist.  Chest is clear to auscultation there is no labored breathing.  Heart is regular rate and rhythm without murmur gallop or rub she does have chronic edema-lymphedema this is cool nonerythematous nontender   Abdomen is soft nontender with positive bowel sounds.  Musculoskeletal is able to move all extremities x4 with lower extremity weakness I cannot really appreciate any significant deformity but exam was limited since patient was in a wheelchair.  Neurologic appears grossly intact cannot appreciate lateralizing findings her speech is clear.  Psych she is alert and oriented very pleasant and appropriate Abdomen is soft nontender with positive bowel sounds.   Labs reviewed: Recent Labs    03/22/20 2229 03/22/20 2229 03/23/20 0805 03/23/20 0805 04/04/20 0000 04/10/20 0000 04/24/20 0000  NA 134*   < > 137  --  121* 123* 130*  K 4.0   < > 3.9   < > 4.7 4.4 4.7  CL 100   < > 104   < > 89* 90* 96*  CO2 25   < > 25   < > 20 20 24*  GLUCOSE 108*  --  100*  --   --   --   --   BUN 26*   < > 20  --  17 12 15   CREATININE 0.63   < > 0.63  --  0.5 0.4* 0.5  CALCIUM 9.7   < > 8.8*   < > 9.0 8.5* 9.4   < > = values in this interval not displayed.   Recent Labs    03/23/20 0805  AST 50*  ALT 33  ALKPHOS 56  BILITOT 1.0  PROT 5.4*  ALBUMIN 3.7   Recent Labs    03/22/20 2229  04/04/20 0000  WBC 6.8 7.9  NEUTROABS 2.7  --   HGB 13.5 15.1  HCT 40.0 44  MCV 98.5  --   PLT 150 312   Lab Results  Component Value Date   TSH 4.716 (H) 03/23/2020   No results found for: HGBA1C No results found for: CHOL, HDL, LDLCALC, LDLDIRECT, TRIG, CHOLHDL  Significant Diagnostic Results in last 30 days:  XR HIP UNILAT W OR W/O PELVIS 2-3 VIEWS RIGHT  Result Date: 03/30/2020 2 view radiographs of the right hip shows a anterior wall and posterior column fracture with protrusio of the femoral head within the acetabulum   Assessment/Plan  #1 history of right pelvic fractures as noted above-she does have follow-up by orthopedics tomorrow.  She is on calcium and vitamin D also on Boniva for osteoporosis.  At this point will wait orthopedic opinion.  She has complained of some muscle spasm pain Robaxin was adjusted  iis now 500 mg 3 times daily at this point will monitor \  #2 history of rheumatoid arthritis-she continues on Kevzara 200 mg every 14 days as well as methotrexate 20 mg q. weekly and prednisone 3 mg a day.  At this point will monitor.  3.  History of hyponatremia-she is on sodium chloride 1 g 3 times daily-sodium has risen now is up to 130 have been as low as 121-we will have this updated next week.  4.  History of hypertension this appears stable on lisinopril 10 mg a day recent blood pressures 118/69-110/66-128/59.  5.  History of hypothyroidism she is on Synthroid 25 mcg a day TSH in hospital was minimally elevated at  4.7.  This will need updating.  6.  History of multiple sclerosis at this point continue supportive care.  [id number]

## 2020-04-27 ENCOUNTER — Telehealth: Payer: Self-pay | Admitting: Orthopedic Surgery

## 2020-04-27 ENCOUNTER — Ambulatory Visit (INDEPENDENT_AMBULATORY_CARE_PROVIDER_SITE_OTHER): Payer: Medicare Other | Admitting: Orthopedic Surgery

## 2020-04-27 ENCOUNTER — Other Ambulatory Visit: Payer: Self-pay

## 2020-04-27 ENCOUNTER — Ambulatory Visit: Payer: Self-pay

## 2020-04-27 DIAGNOSIS — S32421G Displaced fracture of posterior wall of right acetabulum, subsequent encounter for fracture with delayed healing: Secondary | ICD-10-CM

## 2020-04-27 DIAGNOSIS — M25551 Pain in right hip: Secondary | ICD-10-CM

## 2020-04-27 DIAGNOSIS — G35 Multiple sclerosis: Secondary | ICD-10-CM | POA: Diagnosis not present

## 2020-04-27 NOTE — Telephone Encounter (Signed)
Patient called.   She needs a refill on her pain medicine.   Call back: [phone]

## 2020-04-27 NOTE — Telephone Encounter (Signed)
Pt was in the office today with a hip fracture and is asking for pain medication please advise.

## 2020-04-28 ENCOUNTER — Encounter: Payer: Self-pay | Admitting: Orthopedic Surgery

## 2020-04-28 ENCOUNTER — Other Ambulatory Visit: Payer: Self-pay | Admitting: *Deleted

## 2020-04-28 NOTE — Progress Notes (Signed)
Office Visit Note   Patient: [name]           Date of Birth: May 10, 1932           MRN: [id number] Visit Date: 04/27/2020              Requested by: [name], MD [address] [place],  [place] [address] PCP: [name], MD  Chief Complaint  Patient presents with  . Right Hip - Pain      HPI: Patient is an 86 year old woman with a right acetabular fracture. Patient states she has been having increasing pain with attempted therapy at skilled nursing.  Assessment & Plan: Visit Diagnoses:  1. Pain in right hip   2. MS (multiple sclerosis) ([place])   3. Closed displaced fracture of posterior wall of right acetabulum with delayed healing, subsequent encounter     Plan: With patient's progression of a nondisplaced acetabular fracture to displacement and with protrusion of the femoral head patient is given orders for nonweightbearing on the right lower extremity patient we will repeat radiographs in 3 weeks. She may work with therapy for strengthening of her arms and left lower extremity.  Follow-Up Instructions: Return in about 3 weeks (around 05/18/2020).   Ortho Exam  Patient is alert, oriented, no adenopathy, well-dressed, normal affect, normal respiratory effort. Examination patient now has internal rotation of the right hip. Her right leg is neurovascular intact. Radiographs shows progressive protrusion of the right femoral head with progressive destruction of the initially nondisplaced acetabular fracture.  Imaging: No results found. No images are attached to the encounter.  Labs: Lab Results  Component Value Date   ESRSEDRATE 108 (H) 04/03/2010   REPTSTATUS 03/24/2020 FINAL 03/23/2020   CULT (A) 03/23/2020    <10,000 COLONIES/mL INSIGNIFICANT GROWTH Performed at [facility] [address] [place], [place] [address]      Lab Results  Component Value Date   ALBUMIN 3.7 03/23/2020   ALBUMIN 4.0 02/23/2019   ALBUMIN  3.9 12/26/2016    No results found for: MG No results found for: VD25OH  No results found for: PREALBUMIN CBC EXTENDED Latest Ref Rng & Units 04/04/2020 03/22/2020 02/23/2019  WBC - 7.9 6.8 11.0(H)  RBC 3.87 - 5.11 4.56 4.06 4.76  HGB 12.0 - 16.0 15.1 13.5 13.8  HCT 36 - 46 44 40.0 43.2  PLT 150 - 399 312 150 260  NEUTROABS 1.7 - 7.7 K/uL - 2.7 8.6(H)  LYMPHSABS 0.7 - 4.0 K/uL - 3.4 1.7     There is no height or weight on file to calculate BMI.  Orders:  Orders Placed This Encounter  Procedures  . XR HIP UNILAT W OR W/O PELVIS 2-3 VIEWS RIGHT   No orders of the defined types were placed in this encounter.    Procedures: No procedures performed  Clinical Data: No additional findings.  ROS:  All other systems negative, except as noted in the HPI. Review of Systems  Objective: Vital Signs: LMP 09/08/1970 (Approximate)   Specialty Comments:  No specialty comments available.  PMFS History: Patient Active Problem List   Diagnosis Date Noted  . Hyponatremia 04/09/2020  . Acetabular fracture ([place]) 04/01/2020  . Closed right ischial fracture ([place]) 04/01/2020  . Hypertension 04/01/2020  . Hypothyroidism 04/01/2020  . Frequent falls 03/23/2020  . RA (rheumatoid arthritis) ([place]) 03/23/2020  . Tympanic membrane central perforation 12/15/2019  . Cholesteatoma of external auditory canal, right 09/10/2019  . Hearing loss 09/10/2019  .  Chronic venous insufficiency of lower extremity 01/25/2019  . Grade II diastolic dysfunction [id number]  . Urinary dysfunction 12/22/2017  . Multiple sclerosis, primary progressive ([place]) 06/19/2017  . Left foot drop 03/17/2017  . Primary chronic progressive multiple sclerosis ([place]) 03/17/2017  . Myelopathy ([place]) 03/17/2017  . Bilateral lower extremity edema 03/17/2017   Past Medical History:  Diagnosis Date  . Chronic venous insufficiency of lower extremity 01/25/2019   With chronic bilateral lower extremity edema  . Incontinent of  urine   . Multiple sclerosis ([place]) 2015  . Osteoporosis    osteopenia  . Polyarthritis   . Post-menopausal   . Rheumatoid arthritis ([place])   . Temporal arteritis ([place]) 2011    Family History  Problem Relation Age of Onset  . Osteoarthritis Father   . Stroke Father   . Heart failure Mother   . Hypertension Mother   . Heart disease Mother   . Multiple sclerosis Cousin   . Multiple sclerosis Cousin   . Multiple sclerosis Cousin     Past Surgical History:  Procedure Laterality Date  . ABDOMINAL HYSTERECTOMY  1971   TVH  . breast ductectomy Right 05/1995  . COLONOSCOPY  10/2011   Social History   Occupational History  . Not on file  Tobacco Use  . Smoking status: Never Smoker  . Smokeless tobacco: Never Used  Substance and Sexual Activity  . Alcohol use: No  . Drug use: No  . Sexual activity: Not Currently    Partners: Male    Birth control/protection: Post-menopausal, Surgical    Comment: hysterectomy

## 2020-04-28 NOTE — Patient Outreach (Signed)
[facility] Post- Acute Care Coordinator follow up  Mrs. Schiff remains in [facility] receiving skilled therapy  Update received from [facility] team lead after today's telephonic interdisciplinary team meeting. Discussed Probation officer will attempt outreach again. Previously called daughter [name] in [place] without success.  Will make outreach attempt to son/DPR Gene Jones since he lives locally.   Telephone call made to [place] [phone]. Mr. Ronnald Ramp asks that writer contact him later on as he just landed. Not a good time to talk at this time.   Will plan outreach at later time per son's request.   Marthenia Rolling, MSN-Ed, RN,BSN [facility] Acute Care Coordinator [phone] [facility]) [phone]  (Toll free office)

## 2020-04-28 NOTE — Telephone Encounter (Signed)
Please call, she is in SNF and they manage her pain meds

## 2020-04-28 NOTE — Telephone Encounter (Signed)
I called pt to advise of message below. To call with any other questions.  °

## 2020-05-01 ENCOUNTER — Other Ambulatory Visit: Payer: Self-pay | Admitting: *Deleted

## 2020-05-01 NOTE — Patient Outreach (Signed)
[facility] Post- Acute Care Coordinator follow up  Mrs. Pifer remains at [facility].   Telephone call made to Gene Jones (son/DPR) [phone]. Patient identifiers confirmed. Gene states Mrs. Hynek has a new injury which has caused her to be NWB. States member's xray is worse than it was a month ago.  Gene states Mrs. Ammann was independent prior to admission. She still drove and went to the gym. States she is still "mentally fighting to get better." Gene also states member has a strong network of friends locally.   Gene reports he actually lives in [place] and will return to [facility] tomorrow. Gene states he plans on going to [facility] today for an update. Gene states both he and his sister Lyn are involved in his mother's care. Gene advised writer to call Lyn as well. Lyn lives in [place].  Will send Gene secure email with [facility] information and [facility] website address.   Telephone call made to Edward Jolly (daughter/DPR) [phone] to discuss [facility] services and transition plans. Patient identifiers confirmed.  Lyn states writer's call may be somewhat premature as they are trying to sort out plans in light of new injury findings. Lyn states they will likely appeal decision to discharge. Also states the intent is for member to return home. States they will look into paid caregiver assistance once Mrs. Hoxsie returns home.   Explain [facility] services and possible [facility] if needed. Explained that Probation officer will continue to follow along and will plan outreach again at later time. Will provide Lyn with writer and [employer] contact information as well.   Will continue to follow for [facility] services and transition plans while member resides in SNF.   Marthenia Rolling, MSN-Ed, RN,BSN [place] Acute Care Coordinator [phone] [facility]) [phone]  (Toll free office)

## 2020-05-03 LAB — BASIC METABOLIC PANEL
BUN: 12 (ref 4–21)
CO2: 22 (ref 13–22)
Chloride: 99 (ref 99–108)
Creatinine: 0.5 (ref 0.5–1.1)
Glucose: 85
Potassium: 4.6 (ref 3.4–5.3)
Sodium: 132 — AB (ref 137–147)

## 2020-05-03 LAB — COMPREHENSIVE METABOLIC PANEL
Albumin: 4 (ref 3.5–5.0)
Calcium: 9.2 (ref 8.7–10.7)
GFR calc Af Amer: >90
GFR calc non Af Amer: 88.13
Globulin: 1.5

## 2020-05-03 LAB — HEPATIC FUNCTION PANEL
ALT: 24 (ref 7–35)
AST: 29 (ref 13–35)
Alkaline Phosphatase: 136 — AB (ref 25–125)
Bilirubin, Total: 0.3

## 2020-05-03 LAB — CBC AND DIFFERENTIAL
HCT: 38 (ref 36–46)
Hemoglobin: 13.1 (ref 12.0–16.0)
Platelets: 249 (ref 150–399)
WBC: 4.2

## 2020-05-03 LAB — CBC: RBC: 3.87 (ref 3.87–5.11)

## 2020-05-03 LAB — TSH: TSH: 2.95 (ref 0.41–5.90)

## 2020-05-09 ENCOUNTER — Non-Acute Institutional Stay (SKILLED_NURSING_FACILITY): Payer: Medicare Other | Admitting: Internal Medicine

## 2020-05-09 DIAGNOSIS — E871 Hypo-osmolality and hyponatremia: Secondary | ICD-10-CM

## 2020-05-09 DIAGNOSIS — S32401S Unspecified fracture of right acetabulum, sequela: Secondary | ICD-10-CM

## 2020-05-09 DIAGNOSIS — E034 Atrophy of thyroid (acquired): Secondary | ICD-10-CM

## 2020-05-09 NOTE — Progress Notes (Signed)
This is an acute visit.  Level of care skilled.  [facility]  Chief complaint-acute visit secondary to pain management.  History of present illness.  Patient is a pleasant 86 year old female seen today for complaints of some right hip discomfort-appears to be more positional related at times.  She does have a history of a right acetabular fracture-x-ray with orthopedics did show some progression of the nondisplaced fracture with displacement and protrusion of the femoral head-she is under orders for nonweightbearing on the right lower extremity-and orthopedics will repeat x-rays in approximately the next week or 2.  There is recommendation to continue to work with therapy strengthening of her arms and left lower extremity.  She says her pain is not constant but at times positionally feels some discomfort and would like something preferably topically to help.  She does have orders for Robaxin which was recently increased secondary to complaints of discomfort currently has orders for Robaxin 500 mg 3 times daily and also has as needed order.  She also has a history of hyponatremia she is on sodium chloride supplementation sodium is gradually improving actually it was up to 132 on lab done on May 26.  Past Medical History:  Diagnosis Date  . Chronic venous insufficiency of lower extremity 01/25/2019   With chronic bilateral lower extremity edema  . Incontinent of urine   . Multiple sclerosis ([place]) 2015  . Osteoporosis    osteopenia  . Polyarthritis   . Post-menopausal   . Rheumatoid arthritis ([place])   . Temporal arteritis ([place]) 2011        Past Surgical History:  Procedure Laterality Date  . ABDOMINAL HYSTERECTOMY  1971   TVH  . breast ductectomy Right 05/1995  . COLONOSCOPY  10/2011    Allergies  Allergen Reactions  . Sulfasalazine Anaphylaxis  . Aspirin Nausea Only  . Penicillins Diarrhea  . Sulfa Antibiotics          Allergies as of  04/26/2020      Reactions   Sulfasalazine Anaphylaxis   Aspirin Nausea Only   Penicillins Diarrhea   Sulfa Antibiotics          Medication List             Azelastine HCl 137 MCG/SPRAY Soln Place 1 spray into both nostrils 2 (two) times daily as needed (As needed for allergies).   Biotin 10000 MCG Tabs Take 1,000 mcg by mouth daily.   bisacodyl 10 MG suppository Commonly known as: DULCOLAX Place 10 mg rectally daily as needed for moderate constipation.   Calcium-Vitamin D3 250-125 MG-UNIT Tabs Take 1 tablet by mouth 2 (two) times daily.   folic acid 1 MG tablet Commonly known as: FOLVITE Take 2 mg by mouth daily.   ICAPS AREDS FORMULA PO Take 1 capsule by mouth daily.   KEVZARA [place] Inject 200 mg into the skin. Once every other week   levothyroxine 25 MCG tablet Commonly known as: SYNTHROID Take 25 mcg by mouth daily.   lisinopril 10 MG tablet Commonly known as: ZESTRIL Take 1 tablet (10 mg total) by mouth daily.   Magnesium 500 MG Tabs Take 250 tablets by mouth every evening. 0.5 tablet   magnesium hydroxide 400 MG/5ML suspension Commonly known as: MILK OF MAGNESIA Take 30 mLs by mouth daily as needed for mild constipation.   methocarbamol 500 MG tablet Commonly known as: ROBAXIN Take 500 mg by mouth every 8 (eight) hours as needed for muscle spasms.   methocarbamol 500  MG tablet Commonly known as: ROBAXIN Take 500 mg by mouth 3 (three) times daily. ( WITH 1ST ADMINISTRATION EARLY AM)   methotrexate 2.5 MG tablet Take 20 mg by mouth every Thursday. Give 8 tablets to = 20 mg   NON FORMULARY DIET: LIBERALIZE DIET TO REGULAR   polyethylene glycol 17 g packet Commonly known as: MIRALAX / GLYCOLAX Take 17 g by mouth daily.   predniSONE 1 MG tablet Commonly known as: DELTASONE Take 3 mg by mouth daily. Give 3 tablets to = 3 mg   senna-docusate 8.6-50 MG tablet Commonly known as: Senokot-S Take 1 tablet by mouth 2 (two)  times daily.   sodium chloride 1 g tablet Take 1 g by mouth 3 (three) times daily with meals.   SODIUM PHOSPHATES RE Place 1 each rectally daily as needed.   Systane Ultra 0.4-0.3 % Soln Generic drug: Polyethyl Glycol-Propyl Glycol Place 1 drop into both eyes 2 (two) times daily. For eye dryness   Tylenol 325 MG tablet Generic drug: acetaminophen Take 650 mg by mouth every 6 (six) hours as needed.   acetaminophen 325 MG tablet Commonly known as: TYLENOL Take 650 mg by mouth every 8 (eight) hours as needed.   Vitamin D (Ergocalciferol) 1.25 MG (50000 UNIT) Caps capsule Commonly known as: DRISDOL Take 50,000 Units by mouth every 7 (seven) days.       Review of systems.  In general she not complaining of any fever or chills.  Skin does not complain of rashes or itching or diaphoresis.  Head ears eyes nose mouth and throat-not complaining of visual changes or sore throat.  Respiratory does not complain of being short of breath or having a cough.  Cardiac no complaints of chest pain or increasing edema.  She does have chronic lower extremity edema-lymphedema.  GI is not complaining of abdominal pain nausea vomiting diarrhea or constipation.  GU is not complaining of dysuria.  Musculoskeletal does have a history of pelvic and hip discomfort as noted above this appears to be more associated with therapy and with movement and positions.  Neurologic does not complain of dizziness headache or syncope she does have a history of multiple sclerosis.  And psych does not complain of being anxious or being depressed continues to be in good spirits.  .  Physical exam.  Temperature is 98.0 pulse 82 respirations 18 blood pressure 130/78.  In general this is a very pleasant elderly female no distress sitting comfortably in her wheelchair.  Her skin is warm and dry.   Eyes visual acuity appears to be intact sclera and conjunctive are clear.  Oropharynx is clear  mucous membranes moist.  Chest is clear to auscultation there is no labored breathing.  Heart is largely regular rate and rhythm without murmur gallop or rub she has chronic lower extremity edema which remains cool to touch nontender.  Abdomen is soft nontender with positive bowel sounds.  Musculoskeletal Limited exam since she is in a wheelchair but is able to move all extremities x4 at baseline with lower extremity weakness again limited mobility of her right lower extremity.  Neurologic is grossly intact her speech is clear cannot really appreciate lateralizing findings.  Psych she is alert and oriented continues to be very pleasant and appropriate.  .  Labs.  May 03, 2020.  Sodium 132 potassium 4.6 BUN 12.1 creatinine 0.5.  Alkaline phosphatase 136 otherwise liver function test within normal limits.  WBC 4.2 hemoglobin 13.1 platelets 249.  TSH was 2.95.  Assessment and plan.  1.  History of right cerebellar fracture-again this is followed by orthopedics-she is on Robaxin 3 times a day as well as as needed-she says the pain is not constant but at times has positional related discomfort-and would prefer something topically-we will try Voltaren gel 1% twice daily as needed and monitor for effectiveness.  2.  History of hyponatremia-this appears to be improving on sodium chloride tablets most recently 132 will have this rechecked later this week.  3.  History of hypothyroidism she continues on Synthroid 25 mcg a day-lab done on May 26 shows normalization at 2.95 it was minimally elevated in the hospital at 4.7.  Marland Kitchen  [id number]

## 2020-05-10 ENCOUNTER — Encounter: Payer: Self-pay | Admitting: Internal Medicine

## 2020-05-10 ENCOUNTER — Other Ambulatory Visit: Payer: Self-pay | Admitting: *Deleted

## 2020-05-10 NOTE — Patient Outreach (Signed)
Screened for potential [facility] Care Management needs as a benefit of  [employer].  Member is receiving skilled therapy at [facility].  Writer attended telephonic interdisciplinary team meeting to assess for disposition needs and transition plan for resident.   Facility reports member likely to transition to home soon. Will plan outreach to [facility] daughter to discuss transition plans.   Marthenia Rolling, MSN-Ed, RN,BSN [place] Acute Care Coordinator [phone] [facility]) [phone]  (Toll free office)

## 2020-05-11 ENCOUNTER — Ambulatory Visit: Payer: Medicare Other | Admitting: Podiatry

## 2020-05-11 LAB — CBC AND DIFFERENTIAL
HCT: 36 (ref 36–46)
Hemoglobin: 12.6 (ref 12.0–16.0)
Platelets: 221 (ref 150–399)
WBC: 4.1

## 2020-05-11 LAB — BASIC METABOLIC PANEL
BUN: 13 (ref 4–21)
CO2: 25 — AB (ref 13–22)
Chloride: 97 — AB (ref 99–108)
Creatinine: 0.5 (ref 0.5–1.1)
Glucose: 88
Potassium: 4.4 (ref 3.4–5.3)
Sodium: 132 — AB (ref 137–147)

## 2020-05-11 LAB — COMPREHENSIVE METABOLIC PANEL
Calcium: 10 (ref 8.7–10.7)
GFR calc Af Amer: >90
GFR calc non Af Amer: >90

## 2020-05-11 LAB — CBC: RBC: 3.78 — AB (ref 3.87–5.11)

## 2020-05-12 ENCOUNTER — Other Ambulatory Visit: Payer: Self-pay

## 2020-05-12 ENCOUNTER — Ambulatory Visit (INDEPENDENT_AMBULATORY_CARE_PROVIDER_SITE_OTHER): Payer: Medicare Other | Admitting: Family

## 2020-05-12 ENCOUNTER — Ambulatory Visit: Payer: Medicare Other | Admitting: Family

## 2020-05-12 ENCOUNTER — Encounter: Payer: Self-pay | Admitting: Family

## 2020-05-12 ENCOUNTER — Ambulatory Visit (INDEPENDENT_AMBULATORY_CARE_PROVIDER_SITE_OTHER): Payer: Medicare Other

## 2020-05-12 VITALS — Ht 65.0 in | Wt 124.0 lb

## 2020-05-12 DIAGNOSIS — M810 Age-related osteoporosis without current pathological fracture: Secondary | ICD-10-CM | POA: Diagnosis not present

## 2020-05-12 DIAGNOSIS — S32421G Displaced fracture of posterior wall of right acetabulum, subsequent encounter for fracture with delayed healing: Secondary | ICD-10-CM

## 2020-05-12 DIAGNOSIS — R296 Repeated falls: Secondary | ICD-10-CM

## 2020-05-12 DIAGNOSIS — S72001G Fracture of unspecified part of neck of right femur, subsequent encounter for closed fracture with delayed healing: Secondary | ICD-10-CM | POA: Diagnosis not present

## 2020-05-12 LAB — CBC AND DIFFERENTIAL
HCT: 38 (ref 36–46)
Hemoglobin: 13.3 (ref 12.0–16.0)
Platelets: 230 (ref 150–399)
WBC: 4.2

## 2020-05-12 LAB — BASIC METABOLIC PANEL
BUN: 9 (ref 4–21)
CO2: 23 — AB (ref 13–22)
Chloride: 99 (ref 99–108)
Creatinine: 0.5 (ref 0.5–1.1)
Glucose: 92
Potassium: 4.4 (ref 3.4–5.3)
Sodium: 135 — AB (ref 137–147)

## 2020-05-12 LAB — COMPREHENSIVE METABOLIC PANEL
Calcium: 9.6 (ref 8.7–10.7)
GFR calc Af Amer: >90
GFR calc non Af Amer: >90

## 2020-05-12 LAB — CBC: RBC: 3.95 (ref 3.87–5.11)

## 2020-05-14 ENCOUNTER — Encounter: Payer: Self-pay | Admitting: Family

## 2020-05-15 ENCOUNTER — Telehealth: Payer: Self-pay | Admitting: Orthopedic Surgery

## 2020-05-15 NOTE — Telephone Encounter (Signed)
Pts son Gene would like a call back from either [place] or Sharol Given regarding the pts fractured hip; he states she's been in a lot of pain and believes some pain management would be helpful.  [phone]

## 2020-05-15 NOTE — Telephone Encounter (Signed)
Please advise, Dr Sharol Given. Thanks

## 2020-05-15 NOTE — Telephone Encounter (Signed)
I called, go to [facility] for CT of hip, as a walk in , also use some narcotic , can we call [facility] and see if she can have tylenol #3 or vicodin, can we appeal medicare denial of her care, she is non weight bearing for an acetabular fracture, trying to avoid surgery

## 2020-05-16 ENCOUNTER — Encounter: Payer: Self-pay | Admitting: Family

## 2020-05-16 ENCOUNTER — Telehealth: Payer: Self-pay | Admitting: Orthopedic Surgery

## 2020-05-16 NOTE — Progress Notes (Signed)
Office Visit Note   Patient: [name]           Date of Birth: June 16, 1932           MRN: [id number] Visit Date: 05/12/2020              Requested by: [name], MD [address] [place],  [place] [address] PCP: [name], MD  Chief Complaint  Patient presents with  . Right Hip - Pain, Follow-up      HPI: The patient is an 86 year old woman who is currently residing at skilled nursing for rehabilitation.  She has a right acetabular fracture and has been having worsening progression of her nondisplaced acetabular fracture with displacement and protrusion of the femoral head.  Unfortunately she has continued to have worsening of her pain.  She reports that therapy has attempted to assist her with pivoting and possibly weightbearing of the right lower extremity  Assessment & Plan: Visit Diagnoses:  1. Closed displaced fracture of posterior wall of right acetabulum with delayed healing, subsequent encounter   2. Closed fracture of neck of right femur with delayed healing, subsequent encounter     Plan: With continued progression of her nondisplaced acetabular fracture with worsening displacement protrusion of her femoral head which has worsened as well we will be sending her for CT scan of her right hip to further characterize the injury.  She is not to do any physical therapy of the right lower extremity.  The patient continues to require skilled nursing assistance for her activities of daily living. Follow-Up Instructions: No follow-ups on file.   Ortho Exam  Patient is alert, oriented, no adenopathy, well-dressed, normal affect, normal respiratory effort. On examination of the right hip she continues with internal rotation.  The right lower extremity is neurovascularly intact.  Imaging: No results found. No images are attached to the encounter.  Labs: Lab Results  Component Value Date   ESRSEDRATE 108 (H) 04/03/2010   REPTSTATUS  03/24/2020 FINAL 03/23/2020   CULT (A) 03/23/2020    <10,000 COLONIES/mL INSIGNIFICANT GROWTH Performed at [facility] [address], [place], [place] [address]      Lab Results  Component Value Date   ALBUMIN 3.7 03/23/2020   ALBUMIN 4.0 02/23/2019   ALBUMIN 3.9 12/26/2016    No results found for: MG No results found for: VD25OH  No results found for: PREALBUMIN CBC EXTENDED Latest Ref Rng & Units 04/04/2020 03/22/2020 02/23/2019  WBC - 7.9 6.8 11.0(H)  RBC 3.87 - 5.11 4.56 4.06 4.76  HGB 12.0 - 16.0 15.1 13.5 13.8  HCT 36 - 46 44 40.0 43.2  PLT 150 - 399 312 150 260  NEUTROABS 1.7 - 7.7 K/uL - 2.7 8.6(H)  LYMPHSABS 0.7 - 4.0 K/uL - 3.4 1.7     Body mass index is 20.63 kg/m.  Orders:  Orders Placed This Encounter  Procedures  . XR HIP UNILAT W OR W/O PELVIS 2-3 VIEWS RIGHT  . CT HIP RIGHT WO CONTRAST   No orders of the defined types were placed in this encounter.    Procedures: No procedures performed  Clinical Data: No additional findings.  ROS:  All other systems negative, except as noted in the HPI. Review of Systems  Objective: Vital Signs: Ht 5\' 5"  (1.651 m)   Wt 124 lb (56.2 kg)   LMP 09/08/1970 (Approximate)   BMI 20.63 kg/m   Specialty Comments:  No specialty comments available.  [place]  History: Patient Active Problem List   Diagnosis Date Noted  . Hyponatremia 04/09/2020  . Acetabular fracture ([place]) 04/01/2020  . Closed right ischial fracture ([place]) 04/01/2020  . Hypertension 04/01/2020  . Hypothyroidism 04/01/2020  . Frequent falls 03/23/2020  . RA (rheumatoid arthritis) ([place]) 03/23/2020  . Tympanic membrane central perforation 12/15/2019  . Cholesteatoma of external auditory canal, right 09/10/2019  . Hearing loss 09/10/2019  . Chronic venous insufficiency of lower extremity 01/25/2019  . Grade II diastolic dysfunction 20/81/3887  . Urinary dysfunction 12/22/2017  . Multiple sclerosis, primary progressive ([place]) 06/19/2017    . Left foot drop 03/17/2017  . Primary chronic progressive multiple sclerosis ([place]) 03/17/2017  . Myelopathy ([place]) 03/17/2017  . Bilateral lower extremity edema 03/17/2017   Past Medical History:  Diagnosis Date  . Chronic venous insufficiency of lower extremity 01/25/2019   With chronic bilateral lower extremity edema  . Incontinent of urine   . Multiple sclerosis ([place]) 2015  . Osteoporosis    osteopenia  . Polyarthritis   . Post-menopausal   . Rheumatoid arthritis ([place])   . Temporal arteritis ([place]) 2011    Family History  Problem Relation Age of Onset  . Osteoarthritis Father   . Stroke Father   . Heart failure Mother   . Hypertension Mother   . Heart disease Mother   . Multiple sclerosis Cousin   . Multiple sclerosis Cousin   . Multiple sclerosis Cousin     Past Surgical History:  Procedure Laterality Date  . ABDOMINAL HYSTERECTOMY  1971   TVH  . breast ductectomy Right 05/1995  . COLONOSCOPY  10/2011   Social History   Occupational History  . Not on file  Tobacco Use  . Smoking status: Never Smoker  . Smokeless tobacco: Never Used  Substance and Sexual Activity  . Alcohol use: No  . Drug use: No  . Sexual activity: Not Currently    Partners: Male    Birth control/protection: Post-menopausal, Surgical    Comment: hysterectomy

## 2020-05-16 NOTE — Telephone Encounter (Signed)
Please see message below. This pt needs to be scheduled for  right hip CT without contrast at [facility]. They would not sch without auth and did not know the CPT code to give to me so I could call and get it myself. The pt's son would like to be called [phone] and asking if this can be set up tomorrow. It was a stat order from Friday. Please let me know how I can help and status of referral.

## 2020-05-16 NOTE — Telephone Encounter (Signed)
I called and sw pt's son and advised that I was not able to schedule the CT scan. He states that this has already been done and that it was for today at 2pm. I advised that this was not correct I was looking in the imaging tab in her chart and she is actually scheduled for tomorrow at 10 am at [facility]. States that he was not aware that this had been arranged and I advised that he should call the SNF to discuss communication but that I could confirm that this has been sch and that it is for tomorrow at 10

## 2020-05-16 NOTE — Telephone Encounter (Signed)
Patient's son called stating that [name] is going to see her as a walk in this afternoon.  He wanted to make sure that you knew so that you didn't get her scheduled with the hospital.  CB#[phone].

## 2020-05-16 NOTE — Telephone Encounter (Signed)
Patient's son called.   [facility] cannot accommodate his mother. They need a call back to discuss how and where she will go.   Call [phone]

## 2020-05-16 NOTE — Telephone Encounter (Signed)
I called and sw pt's son and he states that [facility] can not transfer the pt from chair to table for the scan. Triage spoke with Elberta Fortis at [facility] to set up there and they will not sch until authorization has been obtained. They do not know the CPT code for the CT scan right hip without contrast and so I was not able to call insurance for approval. The procedure scheduler is in the office this afternoon I advised the pt's son that I would talk with the scheduler to work on this to be scheduled for tomorrow and I will call him this afternoon with update.

## 2020-05-17 ENCOUNTER — Other Ambulatory Visit: Payer: Self-pay

## 2020-05-17 ENCOUNTER — Ambulatory Visit (HOSPITAL_COMMUNITY)
Admission: RE | Admit: 2020-05-17 | Discharge: 2020-05-17 | Disposition: A | Discharge status: Home / Self Care | Payer: Medicare Other | Source: Ambulatory Visit | Attending: Family | Admitting: Family

## 2020-05-17 DIAGNOSIS — S72001G Fracture of unspecified part of neck of right femur, subsequent encounter for closed fracture with delayed healing: Secondary | ICD-10-CM | POA: Diagnosis not present

## 2020-05-17 DIAGNOSIS — S32401A Unspecified fracture of right acetabulum, initial encounter for closed fracture: Secondary | ICD-10-CM | POA: Diagnosis not present

## 2020-05-17 NOTE — Telephone Encounter (Signed)
This has been taken care of.

## 2020-05-18 ENCOUNTER — Ambulatory Visit: Payer: Medicare Other | Admitting: Orthopedic Surgery

## 2020-05-18 DIAGNOSIS — I878 Other specified disorders of veins: Secondary | ICD-10-CM | POA: Diagnosis not present

## 2020-05-18 DIAGNOSIS — M79643 Pain in unspecified hand: Secondary | ICD-10-CM | POA: Diagnosis not present

## 2020-05-18 DIAGNOSIS — I509 Heart failure, unspecified: Secondary | ICD-10-CM | POA: Diagnosis not present

## 2020-05-18 DIAGNOSIS — M810 Age-related osteoporosis without current pathological fracture: Secondary | ICD-10-CM | POA: Diagnosis not present

## 2020-05-18 DIAGNOSIS — M7989 Other specified soft tissue disorders: Secondary | ICD-10-CM | POA: Diagnosis not present

## 2020-05-18 DIAGNOSIS — L659 Nonscarring hair loss, unspecified: Secondary | ICD-10-CM | POA: Diagnosis not present

## 2020-05-18 DIAGNOSIS — Z79899 Other long term (current) drug therapy: Secondary | ICD-10-CM | POA: Diagnosis not present

## 2020-05-18 DIAGNOSIS — D472 Monoclonal gammopathy: Secondary | ICD-10-CM | POA: Diagnosis not present

## 2020-05-18 DIAGNOSIS — G35 Multiple sclerosis: Secondary | ICD-10-CM | POA: Diagnosis not present

## 2020-05-18 DIAGNOSIS — M0579 Rheumatoid arthritis with rheumatoid factor of multiple sites without organ or systems involvement: Secondary | ICD-10-CM | POA: Diagnosis not present

## 2020-05-22 ENCOUNTER — Other Ambulatory Visit: Payer: Self-pay | Admitting: *Deleted

## 2020-05-22 DIAGNOSIS — I10 Essential (primary) hypertension: Secondary | ICD-10-CM | POA: Diagnosis not present

## 2020-05-22 DIAGNOSIS — D649 Anemia, unspecified: Secondary | ICD-10-CM | POA: Diagnosis not present

## 2020-05-22 LAB — BASIC METABOLIC PANEL
BUN: 13 (ref 4–21)
CO2: 25 — AB (ref 13–22)
Chloride: 102 (ref 99–108)
Creatinine: 0.5 (ref 0.5–1.1)
Glucose: 85
Potassium: 4.3 (ref 3.4–5.3)
Sodium: 137 (ref 137–147)

## 2020-05-22 LAB — COMPREHENSIVE METABOLIC PANEL
Calcium: 9.8 (ref 8.7–10.7)
GFR calc Af Amer: >90
GFR calc non Af Amer: 88.68

## 2020-05-22 NOTE — Patient Outreach (Signed)
[facility] Post-Acute Care Coordinator follow up  [name] has converted to private pay at [facility].   [name] (daughter/DPR) indicates she is looking into options for bringing member home with caregiver assistance. The family is weighing their options for private duty care at home vs remaining facility under private pay.  Gabriela states she will keep writer updated on plans if member transitions to home from private pay. Gabriela has writer's contact information including email, and [facility] telephone number as well.   No current [facility] Care Management needs at this time.   Gabriela Rolling, MSN-Ed, RN,BSN [place] Acute Care Coordinator [phone] [facility]) [phone]  (Toll free office)

## 2020-05-24 ENCOUNTER — Encounter: Payer: Self-pay | Admitting: Internal Medicine

## 2020-05-24 ENCOUNTER — Non-Acute Institutional Stay (SKILLED_NURSING_FACILITY): Payer: Medicare Other | Admitting: Internal Medicine

## 2020-05-24 DIAGNOSIS — E871 Hypo-osmolality and hyponatremia: Secondary | ICD-10-CM

## 2020-05-24 DIAGNOSIS — R05 Cough: Secondary | ICD-10-CM

## 2020-05-24 DIAGNOSIS — I10 Essential (primary) hypertension: Secondary | ICD-10-CM

## 2020-05-24 DIAGNOSIS — R059 Cough, unspecified: Secondary | ICD-10-CM

## 2020-05-24 DIAGNOSIS — S32401S Unspecified fracture of right acetabulum, sequela: Secondary | ICD-10-CM

## 2020-05-24 NOTE — Progress Notes (Signed)
Location:    [facility] Room Number: 105/P Place of Service:  SNF (31) Provider:  Karna Dupes, MD  Patient Care Team: [name], MD as PCP - General (Family Medicine) Leonie Man, MD as PCP - Cardiology (Cardiology)  Extended Emergency Contact Information Primary Emergency Contact: [place] Phone: [phone] Relation: Daughter Secondary Emergency Contact: [place] Phone: [phone] Relation: Son  Code Status:  DNR Goals of care: Advanced Directive information Advanced Directives 05/24/2020  Does Patient Have a Medical Advance Directive? Yes  Type of Advance Directive Out of facility DNR (pink MOST or yellow form)  Does patient want to make changes to medical advance directive? No - Patient declined  Copy of [place] in Chart? -  Would patient like information on creating a medical advance directive? -  Pre-existing out of facility DNR order (yellow form or pink MOST form) Yellow form placed in chart (order not valid for inpatient use)     Chief Complaint  Patient presents with  . Acute Visit    Dry Cough    HPI:  Pt is a 86 y.o. female seen today for an acute visit for complaints of a dry cough.  She says his cough has been somewhat chronic and persistent-does not complain of any fever chills shortness of breath or significant production.  Her vital signs are stable she has been afebrile  She does have a history of a right acetabular fracture-she has had some progression of displacement of the right femoral head-this has been followed by Dr. Sharol Given of orthopedics.  At this point she is receiving Robaxin 5 mg 3 times daily as well as as needed Tylenol.  She also has a history of hyponatremia she is on sodium chloride supplementation this continues to improve with a sodium of 137 on lab done June  14.  Currently she is sitting in her wheelchair she appears to be pretty comfortable she continues to be very pleasant and talkative vital signs appear to be stable-she will need expedient follow-up by orthopedics in regards to her hip issues     Past Medical History:  Diagnosis Date  . Chronic venous insufficiency of lower extremity 01/25/2019   With chronic bilateral lower extremity edema  . Incontinent of urine   . Multiple sclerosis ([place]) 2015  . Osteoporosis    osteopenia  . Polyarthritis   . Post-menopausal   . Rheumatoid arthritis ([place])   . Temporal arteritis ([place]) 2011   Past Surgical History:  Procedure Laterality Date  . ABDOMINAL HYSTERECTOMY  1971   TVH  . breast ductectomy Right 05/1995  . COLONOSCOPY  10/2011    Allergies  Allergen Reactions  . Sulfasalazine Anaphylaxis  . Aspirin Nausea Only  . Penicillins Diarrhea  . Sulfa Antibiotics     Outpatient Encounter Medications as of 05/24/2020  Medication Sig  . acetaminophen (TYLENOL) 325 MG tablet Take 650 mg by mouth every 6 (six) hours as needed.  Marland Kitchen acetaminophen (TYLENOL) 325 MG tablet Take 650 mg by mouth every 8 (eight) hours.   . Azelastine HCl 137 MCG/SPRAY SOLN Place 1 spray into both nostrils 2 (two) times daily as needed (As needed for allergies).  . Biotin 10000 MCG TABS Take 1,000 mcg by mouth daily.  . bisacodyl (DULCOLAX) 10 MG suppository Place 10 mg rectally daily as needed for moderate  constipation.  . Calcium Carb-Cholecalciferol (CALCIUM-VITAMIN D3) 250-125 MG-UNIT TABS Take 1 tablet by mouth 2 (two) times daily.  . Carboxymethylcellul-Glycerin (LUBRICATING EYE DROPS OP) Place 1 drop into both eyes 2 (two) times daily. For Eye Dryness  . diclofenac Sodium (VOLTAREN) 1 % GEL APPLY 2 GMS TOPICALLY TO AFFECTED AREAS (RIGHT HIP) TWICE DAILY AS NEEDED FOR PAIN  . folic acid (FOLVITE) 1 MG tablet Take 2 mg by mouth daily.   Marland Kitchen levothyroxine (SYNTHROID, LEVOTHROID) 25 MCG tablet Take 25 mcg by mouth  daily.  Marland Kitchen lisinopril (ZESTRIL) 10 MG tablet Take 1 tablet (10 mg total) by mouth daily.  . Magnesium 500 MG TABS Take 250 tablets by mouth every evening. 0.5 tablet  . magnesium hydroxide (MILK OF MAGNESIA) 400 MG/5ML suspension Take 30 mLs by mouth daily as needed for mild constipation.  . methocarbamol (ROBAXIN) 500 MG tablet Take 500 mg by mouth every 8 (eight) hours as needed for muscle spasms.   . methocarbamol (ROBAXIN) 500 MG tablet Take 500 mg by mouth 3 (three) times daily. ( WITH 1ST ADMINISTRATION EARLY AM)  . methotrexate 2.5 MG tablet Take 15 mg by mouth every Thursday. Give 6 tablets to = 15 mg  . Multiple Vitamins-Minerals (ICAPS AREDS FORMULA PO) Take 1 capsule by mouth daily.   . NON FORMULARY DIET: LIBERALIZE DIET TO REGULAR  . Polyethyl Glycol-Propyl Glycol (SYSTANE ULTRA) 0.4-0.3 % SOLN Place 1 drop into both eyes 2 (two) times daily. For eye dryness  . polyethylene glycol (MIRALAX / GLYCOLAX) 17 g packet Take 17 g by mouth daily.  . Sarilumab (KEVZARA [place]) Inject 200 mg into the skin. Once every other week  . senna-docusate (SENOKOT-S) 8.6-50 MG tablet Take 1 tablet by mouth 2 (two) times daily.  . sodium chloride 1 g tablet Take 1 g by mouth 3 (three) times daily with meals.  . SODIUM PHOSPHATES RE Place 1 each rectally daily as needed.  . Vitamin D, Ergocalciferol, (DRISDOL) 1.25 MG (50000 UNIT) CAPS capsule Take 50,000 Units by mouth every 7 (seven) days.  . [DISCONTINUED] predniSONE (DELTASONE) 1 MG tablet Take 3 mg by mouth daily. Give 3 tablets to = 3 mg   No facility-administered encounter medications on file as of 05/24/2020.    Review of Systems  General she is not complaining of any fever or chills.  Skin does not complain of rashes itching or diaphoresis.  Head ears eyes nose mouth and throat does not complain of visual changes or sore throat.  Respiratory does not complain of shortness of breath-she does complain of a dry cough as noted above.  Cardiac  does not complain of chest pain or increasing edema from baseline.  GI is not complaining of abdominal pain nausea vomiting diarrhea constipation.  GU does not complain of dysuria.  Musculoskeletal has a history of somewhat chronic pelvic and hip discomfort as noted above.  This apparently is more aggravated when she works with therapy and has significant movement.  Neurologic does not complain of dizziness headache syncope-she does have a history of multiple sclerosis at baseline.  And psych is not complaining of being overtly depressed or anxious.     Immunization History  Administered Date(s) Administered  . DTaP 12/09/1994  . Influenza-Unspecified 08/10/2019  . PFIZER SARS-COV-2 Vaccination 01/16/2020, 02/10/2020   Pertinent  Health Maintenance Due  Topic Date Due  . PNA vac Low Risk Adult (1 of 2 - PCV13) Never done  . INFLUENZA VACCINE  07/09/2020  . DEXA SCAN  Completed   Fall Risk  04/23/2020  Risk for fall due to : History of fall(s);Impaired balance/gait;Impaired mobility;Medication side effect;Orthopedic patient  Follow up Falls evaluation completed;Education provided;Falls prevention discussed  Comment at [place]:    Vitals:   05/24/20 1648  BP: (!) 143/64  Pulse: 85  Resp: 18  Temp: 98 F (36.7 C)  TempSrc: Oral  SpO2: 98%  Weight: 121 lb 3.2 oz (55 kg)  Height: 5\' 5"  (1.651 m)   Body mass index is 20.17 kg/m. Physical Exam In general this is a pleasant elderly female in no distress sitting comfortably in her wheelchair.  Her skin is warm and dry.  Eyes visual acuity appears to be intact sclera and conjunctive are clear.  Oropharynx is clear mucous membranes moist.  Chest is clear to auscultation there is no labored breathing.  Heart is regular rate and rhythm without murmur gallop or rub continues with her baseline lower extremity edema I would say this is 1-2+.  Abdomen is soft nontender with positive bowel  sounds.  Musculoskeletal continues to be a limited exam since she is in her wheelchair does have baseline lower extremity weakness moves her upper extremities at baseline this appears relatively unchanged from previous exams although was limited.  Neurologic is grossly intact her speech is clear. Cannot appreciate lateralizing findings.  Psych she is alert and oriented quite pleasant and appropriate continues to be quite talkative.   Labs reviewed: Recent Labs    03/22/20 2229 03/22/20 2229 03/23/20 0805 04/04/20 0000 05/11/20 0000 05/12/20 0000 05/22/20 0000  NA 134*   < > 137   < > 132* 135* 137  K 4.0   < > 3.9   < > 4.4 4.4 4.3  CL 100   < > 104   < > 97* 99 102  CO2 25   < > 25   < > 25* 23* 25*  GLUCOSE 108*  --  100*  --   --   --   --   BUN 26*   < > 20   < > 13 9 13   CREATININE 0.63   < > 0.63   < > 0.5 0.5 0.5  CALCIUM 9.7   < > 8.8*   < > 10.0 9.6 9.8   < > = values in this interval not displayed.   Recent Labs    03/23/20 0805 05/03/20 0000  AST 50* 29  ALT 33 24  ALKPHOS 56 136*  BILITOT 1.0  --   PROT 5.4*  --   ALBUMIN 3.7 4.0   Recent Labs    03/22/20 2229 04/04/20 0000 05/03/20 0000 05/11/20 0000 05/12/20 0000  WBC 6.8   < > 4.2 4.1 4.2  NEUTROABS 2.7  --   --   --   --   HGB 13.5   < > 13.1 12.6 13.3  HCT 40.0   < > 38 36 38  MCV 98.5  --   --   --   --   PLT 150   < > 249 221 230   < > = values in this interval not displayed.   Lab Results  Component Value Date   TSH 2.95 05/03/2020   No results found for: HGBA1C No results found for: CHOL, HDL, LDLCALC, LDLDIRECT, TRIG, CHOLHDL  Significant Diagnostic Results in last 30 days:  CT HIP RIGHT WO CONTRAST  Result Date: 05/17/2020 CLINICAL DATA:  Acetabular fracture with worsening displacement  of the right femoral head EXAM: CT OF THE RIGHT HIP WITHOUT CONTRAST TECHNIQUE: Multidetector CT imaging of the right hip was performed according to the standard protocol. Multiplanar CT image  reconstructions were also generated. COMPARISON:  Multiple exams, including CT right hip from 03/15/2020 FINDINGS: Bones/Joint/Cartilage Compared to the prior CT and to the MRI of 03/23/2020, there has been substantial displacement and comminution of the fracture component extending through the right acetabular roof and also involving the quadrilateral plate, the base of the superior pubic ramus, and extending longitudinally through the posterior column and into the ischium. A sclerotic band through the midportion of the right inferior pubic ramus on image 116/2 is new and indicates a component of the anterior column part of the fracture. There is been 4 mm of medial displacement of the quadrilateral plate fragment as shown on image 76/2, as well as flattening and displacement of the acetabular roof fragments. There has been interval resorption of the anterior half of the right femoral head, a rapid development given the lack of such a finding on prior MRI 03/23/2020. There is no prior such femoral head fracture on 03/23/2020, and accordingly this is felt to have occurred in the interim. There is a osteoid and woven bone deposition along the fracture margins in the adjacent soft tissues, and also some extent within the joint itself. Some of the woven bone type of appearance in the joint may reflect part of the original resorbing femoral head. Old healed fractures of the left pubic rami.  Bone island at L5. Large right hip joint effusion containing some of the immature bone noted above. Ligaments Suboptimally assessed by CT. Muscles and Tendons Fluid density tracks up adjacent to the gluteus minimus muscle posterior to the right iliac bone. Small amount of calcification in the distal iloipsoas. Soft tissues Mild presacral edema. Right kidney lower pole exophytic hypodense lesion, likely a cyst. Right iliac artery atherosclerotic calcification. IMPRESSION: 1. Progression of right acetabular and femoral head pathology  including comminuted fractures involving the acetabular roof, quadrilateral plate, posterior column, and also with anterior column involved. As compared to prior MRI and CT, these fractures are now mildly displaced, with associated adjacent woven bone, and with new resorption of the anterior half of the humeral head within the somewhat expanded acetabular socket. There is woven bone within the joint as well as a prominent joint effusion on the right. Strictly speaking, given the rapidity and severity of progression, joint infection is not readily excluded, correlate with symptoms in determining whether arthrocentesis is indicated. 2. There is a newly appreciable sclerotic band through the midportion of the right inferior pubic ramus on image 116/2 and indicates a component of the anterior column part of the fracture. 3. Old healed fractures of the left pubic rami. 4. Fluid density tracks up adjacent to the gluteus minimus muscle posterior to the right iliac bone. 5. Atherosclerosis. 6. Right kidney lower pole exophytic hypodense lesion, likely a cyst. Electronically Signed   By: Van Clines M.D.   On: 05/17/2020 11:11   XR HIP UNILAT W OR W/O PELVIS 2-3 VIEWS RIGHT  Result Date: 05/16/2020 Radiographs of the right hip show further progression of the protrusion of the right femoral head with progressive destruction of the nondisplaced acetabular fracture on the right.    Assessment/Plan  #1 dry cough.  --Physical exam appear to be benign she does not really complain of any increased chest congestion or production fever or chills.  I did review her medications and  I do note that she is on lisinopril 10 mg a day which can cause a dry cough.  We will discontinue the lisinopril and start losartan 25 mg a day.  At this point continue to monitor blood pressures recent blood pressures-143/64-118/64-124/68-134/69-142/71.  122/66-106/70.  I see previous systolics a couple in the 150s but this appears  to be pretty uncommon-appears baseline systolic is under 301-THYH will need to be watched with the medication change.  2.  Hypertension as noted above.  3.  History of right acetabular fracture-recent CT scan did show progression of this-this has been followed by Dr. Bing Neighbors discussion with patient follow-up by Dr. Sharol Given is pending-at this point emphasis is on pain control she does have Robaxin as well as Tylenol as needed-pain appears to be more associated with movement and thus Robaxin was added.  4.  Hyponatremia as noted above this appears to be improving on sodium chloride will continue to monitor periodically.  [id number]

## 2020-05-26 ENCOUNTER — Telehealth: Payer: Self-pay | Admitting: Orthopedic Surgery

## 2020-05-26 NOTE — Telephone Encounter (Signed)
Pt would like a CB to discuss her hip.   [phone]

## 2020-05-26 NOTE — Telephone Encounter (Signed)
Dr Sharol Given please advise. Patient was called and wanted to discuss her CT results from 05/17/2020. Can you please call? Thank you

## 2020-05-27 ENCOUNTER — Encounter: Payer: Self-pay | Admitting: Internal Medicine

## 2020-05-29 NOTE — Telephone Encounter (Signed)
I called, reviewed CT scan, slow healing fibrous bone, continue non weight bearing right leg, OK for discharge to home if she is safe with a walker. We will set up PT for strengthening BUE and BLE, NWB right

## 2020-05-30 ENCOUNTER — Telehealth: Payer: Self-pay

## 2020-05-30 ENCOUNTER — Non-Acute Institutional Stay (SKILLED_NURSING_FACILITY): Payer: Medicare Other | Admitting: Internal Medicine

## 2020-05-30 ENCOUNTER — Other Ambulatory Visit: Payer: Self-pay

## 2020-05-30 ENCOUNTER — Encounter: Payer: Self-pay | Admitting: Internal Medicine

## 2020-05-30 DIAGNOSIS — M069 Rheumatoid arthritis, unspecified: Secondary | ICD-10-CM | POA: Diagnosis not present

## 2020-05-30 DIAGNOSIS — S32401S Unspecified fracture of right acetabulum, sequela: Secondary | ICD-10-CM | POA: Diagnosis not present

## 2020-05-30 DIAGNOSIS — E871 Hypo-osmolality and hyponatremia: Secondary | ICD-10-CM

## 2020-05-30 DIAGNOSIS — G35 Multiple sclerosis: Secondary | ICD-10-CM

## 2020-05-30 DIAGNOSIS — M25551 Pain in right hip: Secondary | ICD-10-CM | POA: Diagnosis not present

## 2020-05-30 DIAGNOSIS — R05 Cough: Secondary | ICD-10-CM

## 2020-05-30 DIAGNOSIS — R059 Cough, unspecified: Secondary | ICD-10-CM

## 2020-05-30 LAB — BASIC METABOLIC PANEL
BUN: 18 (ref 4–21)
CO2: 26 — AB (ref 13–22)
Chloride: 103 (ref 99–108)
Creatinine: 0.5 (ref 0.5–1.1)
Glucose: 86
Potassium: 4.5 (ref 3.4–5.3)
Sodium: 138 (ref 137–147)

## 2020-05-30 LAB — HEPATIC FUNCTION PANEL
ALT: 16 (ref 7–35)
AST: 27 (ref 13–35)
Alkaline Phosphatase: 78 (ref 25–125)

## 2020-05-30 LAB — CBC AND DIFFERENTIAL
HCT: 39 (ref 36–46)
Hemoglobin: 13 (ref 12.0–16.0)
WBC: 4.5

## 2020-05-30 LAB — COMPREHENSIVE METABOLIC PANEL
Albumin: 4 (ref 3.5–5.0)
Calcium: 9.9 (ref 8.7–10.7)
GFR calc Af Amer: >90
GFR calc non Af Amer: >90
Globulin: 1.3

## 2020-05-30 LAB — CBC: RBC: 3.87 (ref 3.87–5.11)

## 2020-05-30 NOTE — Progress Notes (Signed)
Location:  [facility] Room Number: 105 p Place of Service:  SNF (31) Provider:  Korin Hartwell L. Mariea Clonts, D.O., C.M.D.  [name], MD  Patient Care Team: [name], MD as PCP - General (Family Medicine) Leonie Man, MD as PCP - Cardiology (Cardiology)  Extended Emergency Contact Information Primary Emergency Contact: [place] Phone: [phone] Relation: Daughter Secondary Emergency Contact: [place] Phone: [phone] Relation: Son  Code Status:  DNR Goals of care: Advanced Directive information Advanced Directives 05/30/2020  Does Patient Have a Medical Advance Directive? -  Type of Advance Directive Out of facility DNR (pink MOST or yellow form)  Does patient want to make changes to medical advance directive? No - Patient declined  Copy of [place] in Chart? -  Would patient like information on creating a medical advance directive? -  Pre-existing out of facility DNR order (yellow form or pink MOST form) Pink MOST/Yellow Form most recent copy in chart - Physician notified to receive inpatient order   Chief Complaint  Patient presents with  . Medical Management of Chronic Issues    routine visit     HPI:  Pt is a 86 y.o. female seen today for an acute visit for resident request.  Tylisa has a h/o MS and RA.  She had sustained several falls at home and her last resulted in anterior wall and posterior column fracture with protrusion of the femoral head within the acetabulum 03/30/20.  She has been here for rehab since 03/26/20 with nonweightbearing status.  Unfortunately, she was having increased pain with attempted therapy and returned to orthopedics on 5/20 to see Dr. Sharol Given.  Imaging noted progression of the nondisplaced acetabular fracture to displacement and protrusion of the femoral head.  She was not to weightbear and to have  repeat imaging in 3 wks.  When she returned on 05/12/20, she was continuing to have considerable pain and CT was done and she was not to do any PT with her right LE.  This was done 6/9 at [facility]:   1. Progression of right acetabular and femoral head pathology including comminuted fractures involving the acetabular roof, quadrilateral plate, posterior column, and also with anterior column involved. As compared to prior MRI and CT, these fractures are now mildly displaced, with associated adjacent woven bone, and with new resorption of the anterior half of the humeral head within the somewhat expanded acetabular socket. There is woven bone within the joint as well as a prominent joint effusion on the right. Strictly speaking, given the rapidity and severity of progression, joint infection is not readily excluded, correlate with symptoms in determining whether arthrocentesis is indicated. 2. There is a newly appreciable sclerotic band through the midportion of the right inferior pubic ramus on image 116/2 and indicates a component of the anterior column part of the fracture. 3. Old healed fractures of the left pubic rami. 4. Fluid density tracks up adjacent to the gluteus minimus muscle posterior to the right iliac bone. 5. Atherosclerosis. 6. Right kidney lower pole exophytic hypodense lesion, likely a cyst.  She was seen for a dry cough by [facility] on 6/16 and felt it was coming from her lisinopril so he switched her to losartan.  Bps had been quite good.  She is still having the cough and admits she has some intermittent difficulty longstanding with  postnasal drip.  When I saw her today, she reports considerable ongoing discomfort in her right leg especially first thing in the morning when it's quite stiff related to her spasticity from her MS.  She is eventually able to straighten it.    She also talks about an all out incontinence of urine spell overnight where the whole bed got soaked.    We  discussed her long-term plan which is to get back to doing therapy here as ordered by Dr. Sharol Given and then try to go home with home health therapy and caregiver support.    Her sodium levels continue to improve but she's still requiring sodium chloride supplement tid.    Past Medical History:  Diagnosis Date  . Chronic venous insufficiency of lower extremity 01/25/2019   With chronic bilateral lower extremity edema  . Incontinent of urine   . Multiple sclerosis ([place]) 2015  . Osteoporosis    osteopenia  . Polyarthritis   . Post-menopausal   . Rheumatoid arthritis ([place])   . Temporal arteritis ([place]) 2011   Past Surgical History:  Procedure Laterality Date  . ABDOMINAL HYSTERECTOMY  1971   TVH  . breast ductectomy Right 05/1995  . COLONOSCOPY  10/2011    Allergies  Allergen Reactions  . Sulfasalazine Anaphylaxis  . Aspirin Nausea Only  . Penicillins Diarrhea  . Sulfa Antibiotics     Outpatient Encounter Medications as of 05/30/2020  Medication Sig  . acetaminophen (TYLENOL) 325 MG tablet Take 650 mg by mouth every 6 (six) hours as needed.  Marland Kitchen acetaminophen (TYLENOL) 325 MG tablet Take 650 mg by mouth every 8 (eight) hours.   . Azelastine HCl 137 MCG/SPRAY SOLN Place 1 spray into both nostrils 2 (two) times daily as needed (As needed for allergies).  . Biotin 10000 MCG TABS Take 1,000 mcg by mouth daily.  . bisacodyl (DULCOLAX) 10 MG suppository Place 10 mg rectally daily as needed for moderate constipation.  . Calcium Carb-Cholecalciferol (CALCIUM-VITAMIN D3) 250-125 MG-UNIT TABS Take 1 tablet by mouth 2 (two) times daily.  . Carboxymethylcellul-Glycerin (LUBRICATING EYE DROPS OP) Place 1 drop into both eyes 2 (two) times daily. For Eye Dryness  . diclofenac Sodium (VOLTAREN) 1 % GEL APPLY 2 GMS TOPICALLY TO AFFECTED AREAS (RIGHT HIP) TWICE DAILY AS NEEDED FOR PAIN  . folic acid (FOLVITE) 1 MG tablet Take 2 mg by mouth daily.   Marland Kitchen levothyroxine (SYNTHROID, LEVOTHROID) 25 MCG tablet  Take 25 mcg by mouth daily.  Marland Kitchen lisinopril (ZESTRIL) 10 MG tablet Take 1 tablet (10 mg total) by mouth daily.  . Magnesium 500 MG TABS Take 250 tablets by mouth every evening. 0.5 tablet  . magnesium hydroxide (MILK OF MAGNESIA) 400 MG/5ML suspension Take 30 mLs by mouth daily as needed for mild constipation.  . methocarbamol (ROBAXIN) 500 MG tablet Take 500 mg by mouth every 8 (eight) hours as needed for muscle spasms.   . methocarbamol (ROBAXIN) 500 MG tablet Take 500 mg by mouth 3 (three) times daily. ( WITH 1ST ADMINISTRATION EARLY AM)  . methotrexate 2.5 MG tablet Take 15 mg by mouth every Thursday. Give 6 tablets to = 15 mg  . Multiple Vitamins-Minerals (ICAPS AREDS FORMULA PO) Take 1 capsule by mouth daily.   . NON FORMULARY DIET: LIBERALIZE DIET TO REGULAR  . Polyethyl Glycol-Propyl Glycol (SYSTANE ULTRA) 0.4-0.3 % SOLN Place 1 drop into both eyes 2 (two) times daily. For eye dryness  . polyethylene glycol (MIRALAX / GLYCOLAX) 17 g packet Take 17  g by mouth daily.  . Sarilumab (KEVZARA [place]) Inject 200 mg into the skin. Once every other week  . senna-docusate (SENOKOT-S) 8.6-50 MG tablet Take 1 tablet by mouth 2 (two) times daily.  . sodium chloride 1 g tablet Take 1 g by mouth 3 (three) times daily with meals.  . SODIUM PHOSPHATES RE Place 1 each rectally daily as needed.  . Vitamin D, Ergocalciferol, (DRISDOL) 1.25 MG (50000 UNIT) CAPS capsule Take 50,000 Units by mouth every 7 (seven) days.   No facility-administered encounter medications on file as of 05/30/2020.    Review of Systems  Constitutional: Negative for chills and fever.  HENT:       Postnasal drip  Eyes: Negative for blurred vision.       Macular on areds2  Respiratory: Positive for cough. Negative for sputum production, shortness of breath and wheezing.   Cardiovascular: Positive for leg swelling. Negative for chest pain and palpitations.       Chronic LE edema  Gastrointestinal: Negative for abdominal pain and  constipation.  Genitourinary: Negative for dysuria.       Incontinence  Musculoskeletal: Positive for joint pain. Negative for falls.  Skin: Negative for itching and rash.  Neurological: Positive for weakness. Negative for loss of consciousness.  Endo/Heme/Allergies: Bruises/bleeds easily.  Psychiatric/Behavioral: Negative for depression and memory loss. The patient is not nervous/anxious and does not have insomnia.     Immunization History  Administered Date(s) Administered  . DTaP 12/09/1994  . Influenza-Unspecified 08/10/2019  . PFIZER SARS-COV-2 Vaccination 01/16/2020, 02/10/2020   Pertinent  Health Maintenance Due  Topic Date Due  . PNA vac Low Risk Adult (1 of 2 - PCV13) Never done  . INFLUENZA VACCINE  07/09/2020  . DEXA SCAN  Completed   Fall Risk  04/23/2020  Risk for fall due to : History of fall(s);Impaired balance/gait;Impaired mobility;Medication side effect;Orthopedic patient  Follow up Falls evaluation completed;Education provided;Falls prevention discussed  Comment at [facility]:    Vitals:   05/30/20 0846  BP: (!) 141/72  Pulse: 69  Temp: 97.6 F (36.4 C)  Weight: 121 lb 3.2 oz (55 kg)  Height: 5\' 5"  (1.651 m)   Body mass index is 20.17 kg/m. Physical Exam Vitals reviewed.  Constitutional:      General: She is not in acute distress.    Appearance: Normal appearance. She is not toxic-appearing.  HENT:     Head: Normocephalic and atraumatic.  Cardiovascular:     Rate and Rhythm: Normal rate and regular rhythm.     Pulses: Normal pulses.     Heart sounds: Normal heart sounds.  Pulmonary:     Effort: Pulmonary effort is normal.     Breath sounds: Normal breath sounds. No wheezing, rhonchi or rales.  Abdominal:     General: Bowel sounds are normal.  Musculoskeletal:     Right lower leg: No edema.     Left lower leg: No edema.     Comments: Resting in bed early this am so edema has gone down  Neurological:     Mental  Status: She is alert and oriented to person, place, and time.     Motor: Weakness present.     Comments: RLE difficult to straighten--remains tender over lateral hip  Psychiatric:        Mood and Affect: Mood normal.        Behavior: Behavior normal.     Labs reviewed: Recent Labs    03/22/20  2229 03/22/20 2229 03/23/20 0805 04/04/20 0000 05/11/20 0000 05/12/20 0000 05/22/20 0000  NA 134*   < > 137   < > 132* 135* 137  K 4.0   < > 3.9   < > 4.4 4.4 4.3  CL 100   < > 104   < > 97* 99 102  CO2 25   < > 25   < > 25* 23* 25*  GLUCOSE 108*  --  100*  --   --   --   --   BUN 26*   < > 20   < > 13 9 13   CREATININE 0.63   < > 0.63   < > 0.5 0.5 0.5  CALCIUM 9.7   < > 8.8*   < > 10.0 9.6 9.8   < > = values in this interval not displayed.   Recent Labs    03/23/20 0805 05/03/20 0000  AST 50* 29  ALT 33 24  ALKPHOS 56 136*  BILITOT 1.0  --   PROT 5.4*  --   ALBUMIN 3.7 4.0   Recent Labs    03/22/20 2229 04/04/20 0000 05/03/20 0000 05/11/20 0000 05/12/20 0000  WBC 6.8   < > 4.2 4.1 4.2  NEUTROABS 2.7  --   --   --   --   HGB 13.5   < > 13.1 12.6 13.3  HCT 40.0   < > 38 36 38  MCV 98.5  --   --   --   --   PLT 150   < > 249 221 230   < > = values in this interval not displayed.   Lab Results  Component Value Date   TSH 2.95 05/03/2020   No results found for: HGBA1C No results found for: CHOL, HDL, LDLCALC, LDLDIRECT, TRIG, CHOLHDL  Significant Diagnostic Results in last 30 days:  CT HIP RIGHT WO CONTRAST  Result Date: 05/17/2020 CLINICAL DATA:  Acetabular fracture with worsening displacement of the right femoral head EXAM: CT OF THE RIGHT HIP WITHOUT CONTRAST TECHNIQUE: Multidetector CT imaging of the right hip was performed according to the standard protocol. Multiplanar CT image reconstructions were also generated. COMPARISON:  Multiple exams, including CT right hip from 03/15/2020 FINDINGS: Bones/Joint/Cartilage Compared to the prior CT and to the MRI of  03/23/2020, there has been substantial displacement and comminution of the fracture component extending through the right acetabular roof and also involving the quadrilateral plate, the base of the superior pubic ramus, and extending longitudinally through the posterior column and into the ischium. A sclerotic band through the midportion of the right inferior pubic ramus on image 116/2 is new and indicates a component of the anterior column part of the fracture. There is been 4 mm of medial displacement of the quadrilateral plate fragment as shown on image 76/2, as well as flattening and displacement of the acetabular roof fragments. There has been interval resorption of the anterior half of the right femoral head, a rapid development given the lack of such a finding on prior MRI 03/23/2020. There is no prior such femoral head fracture on 03/23/2020, and accordingly this is felt to have occurred in the interim. There is a osteoid and woven bone deposition along the fracture margins in the adjacent soft tissues, and also some extent within the joint itself. Some of the woven bone type of appearance in the joint may reflect part of the original resorbing femoral head. Old healed fractures of the left pubic  rami.  Bone island at L5. Large right hip joint effusion containing some of the immature bone noted above. Ligaments Suboptimally assessed by CT. Muscles and Tendons Fluid density tracks up adjacent to the gluteus minimus muscle posterior to the right iliac bone. Small amount of calcification in the distal iloipsoas. Soft tissues Mild presacral edema. Right kidney lower pole exophytic hypodense lesion, likely a cyst. Right iliac artery atherosclerotic calcification. IMPRESSION: 1. Progression of right acetabular and femoral head pathology including comminuted fractures involving the acetabular roof, quadrilateral plate, posterior column, and also with anterior column involved. As compared to prior MRI and CT, these  fractures are now mildly displaced, with associated adjacent woven bone, and with new resorption of the anterior half of the humeral head within the somewhat expanded acetabular socket. There is woven bone within the joint as well as a prominent joint effusion on the right. Strictly speaking, given the rapidity and severity of progression, joint infection is not readily excluded, correlate with symptoms in determining whether arthrocentesis is indicated. 2. There is a newly appreciable sclerotic band through the midportion of the right inferior pubic ramus on image 116/2 and indicates a component of the anterior column part of the fracture. 3. Old healed fractures of the left pubic rami. 4. Fluid density tracks up adjacent to the gluteus minimus muscle posterior to the right iliac bone. 5. Atherosclerosis. 6. Right kidney lower pole exophytic hypodense lesion, likely a cyst. Electronically Signed   By: Van Clines M.D.   On: 05/17/2020 11:11   XR HIP UNILAT W OR W/O PELVIS 2-3 VIEWS RIGHT  Result Date: 05/16/2020 Radiographs of the right hip show further progression of the protrusion of the right femoral head with progressive destruction of the nondisplaced acetabular fracture on the right.    Assessment/Plan 1. Pain in right hip -persists, to be resuming therapy, but still nonweightbearing on right LE -cont voltaren gel, tylenol and robaxin  2. Closed displaced fracture of right acetabulum, unspecified portion of acetabulum, sequela -unfortunately, it went from nondisplaced to displaced during her time getting therapy and she had increased pain -she now has new orders for more therapy, nonweightbearing on right  3. Multiple sclerosis, primary progressive ([place]) -was limiting mobility previously, but was able to ambulate with walker at home prior to these fractures -cont kevzara that she brings from home -I wondered about any role of botox for her spasticity  4. Rheumatoid arthritis,  involving unspecified site, unspecified whether rheumatoid factor present ([place]) -cont her current treatment with methotrexate and folic acid and f/u with rheum outpatient  5. Hyponatremia -is requiring the sodium tabs tid and Na trended up over stay  6. Cough -off ace and on arb, but still has some cough and sounds like she has postnasal drip, was also sneezing a bit, may need flonase or antihistamine   Family/ staff Communication:  Discussed with snf nurse  Labs/tests ordered:  No new added by me today  One hour spent with patient and reviewing her chart, labs, imaging.  Aquarius Latouche L. Nahia Nissan, D.O. [facility] [address], [place] [address] Cell Phone (Mon-Fri 8am-5pm):  [phone] On Call:  [phone] & follow prompts after 5pm & weekends Office Phone:  [phone] Office Fax:  [phone]

## 2020-05-30 NOTE — Telephone Encounter (Signed)
Order faxed to SNF per Dr. Sharol Given request fax # [phone] to advise ok for PT strengthening bilat UE and LE with no weight bearing on the right. This was faxed today.

## 2020-06-02 ENCOUNTER — Ambulatory Visit: Payer: Medicare Other | Admitting: Podiatry

## 2020-06-05 ENCOUNTER — Encounter: Payer: Self-pay | Admitting: Internal Medicine

## 2020-06-05 ENCOUNTER — Non-Acute Institutional Stay (SKILLED_NURSING_FACILITY): Payer: Medicare Other | Admitting: Internal Medicine

## 2020-06-05 DIAGNOSIS — G35 Multiple sclerosis: Secondary | ICD-10-CM

## 2020-06-05 DIAGNOSIS — E034 Atrophy of thyroid (acquired): Secondary | ICD-10-CM

## 2020-06-05 DIAGNOSIS — I10 Essential (primary) hypertension: Secondary | ICD-10-CM

## 2020-06-05 DIAGNOSIS — E871 Hypo-osmolality and hyponatremia: Secondary | ICD-10-CM | POA: Diagnosis not present

## 2020-06-05 DIAGNOSIS — S32401S Unspecified fracture of right acetabulum, sequela: Secondary | ICD-10-CM | POA: Diagnosis not present

## 2020-06-05 LAB — COMPREHENSIVE METABOLIC PANEL
Calcium: 9.5 (ref 8.7–10.7)
GFR calc Af Amer: >90
GFR calc non Af Amer: >90

## 2020-06-05 LAB — BASIC METABOLIC PANEL
BUN: 17 (ref 4–21)
CO2: 23 — AB (ref 13–22)
Chloride: 101 (ref 99–108)
Creatinine: 0.5 (ref 0.5–1.1)
Glucose: 85
Potassium: 4.1 (ref 3.4–5.3)
Sodium: 135 — AB (ref 137–147)

## 2020-06-05 NOTE — Progress Notes (Signed)
Location:    [facility] Room Number: 105/P Place of Service:  SNF (31)  Provider: Lorne Skeens  PCP: [name], MD Patient Care Team: [name], MD as PCP - General (Family Medicine) Leonie Man, MD as PCP - Cardiology (Cardiology)  Extended Emergency Contact Information Primary Emergency Contact: Mila Palmer of [place] Phone: [phone] Relation: Daughter Secondary Emergency Contact: [place] of [place] Phone: [phone] Relation: Son  Code Status: DNR Goals of care:  Advanced Directive information Advanced Directives 06/05/2020  Does Patient Have a Medical Advance Directive? Yes  Type of Advance Directive Out of facility DNR (pink MOST or yellow form)  Does patient want to make changes to medical advance directive? No - Patient declined  Copy of [place] in Chart? -  Would patient like information on creating a medical advance directive? -  Pre-existing out of facility DNR order (yellow form or pink MOST form) Yellow form placed in chart (order not valid for inpatient use)     Allergies  Allergen Reactions  . Sulfasalazine Anaphylaxis  . Aspirin Nausea Only  . Penicillins Diarrhea  . Sulfa Antibiotics     Chief Complaint  Patient presents with  . Discharge Note    Discharge Visit    HPI:  86 y.o. female seen today for discharge from facility which is scheduled for for Wednesday, June 07, 2020.  She has a history of MS and rheumatoid arthritis-she had a history of falls at home and last resulted in an anterior wall and posterior column fracture with protrusion of the femoral head within the acetabulum-she has been here for rehab since mid April with nonweightbearing status.  She was having increased pain with therapy and returned  to orthopedics in late May.  Imaging showed progression of the nondisplaced acetabular fracture to  displacement and protrusion of the femoral head.  She was to be nonweightbearing and had repeat imaging.  In early June she continued to have considerable pain and CT was done-and since then there has been recommendation for no weightbearing on the right.-- It is okay for physical therapy for strengthening but no weightbearing.  She will need extensive DME secondary to her continued debility including a standard wheelchair with swing away legs and semielectric hospital bed with half rails.  .  In regards to her other issues these appear to be stable she is on methotrexate once a week for rheumatoid arthritis.  She also has had a history of hyponatremia but this appears to have stabilized on sodium chloride tablets which she receives 3 times a day sodium on today's lab was 135.  She also continues on low-dose magnesium her magnesium level was 1.9 on today's lab.  In regards to a recent dry cough this is thought possibly due to either the ACE inhibitor she was on her possibly nasal drainage.  She has been switched from lisinopril to [place] pressures appear to be stable with systolics ranging from the low 100s to around 140.  She continues to have some cough but she says this is chronic she has been prescribed Azelastine for prospect that some of this may be caused by nasal drainage.  She does not report any worsening.  In regards to the MS she continues on Kevezara injection every other week.  Currently she is sitting in her wheelchair comfortably she has just finished lunch and appears  to have a pretty good appetite.  She has no acute complaints today.  She will be having assistance at home she assures me.        Past Medical History:  Diagnosis Date  . Chronic venous insufficiency of lower extremity 01/25/2019   With chronic bilateral lower extremity edema  . Incontinent of urine   . Multiple sclerosis ([place]) 2015  . Osteoporosis    osteopenia  . Polyarthritis   .  Post-menopausal   . Rheumatoid arthritis ([place])   . Temporal arteritis ([place]) 2011    Past Surgical History:  Procedure Laterality Date  . ABDOMINAL HYSTERECTOMY  1971   TVH  . breast ductectomy Right 05/1995  . COLONOSCOPY  10/2011      reports that she has never smoked. She has never used smokeless tobacco. She reports that she does not drink alcohol and does not use drugs. Social History   Socioeconomic History  . Marital status: Widowed    Spouse name: Not on file  . Number of children: 2  . Years of education: Not on file  . Highest education level: Not on file  Occupational History  . Not on file  Tobacco Use  . Smoking status: Never Smoker  . Smokeless tobacco: Never Used  Substance and Sexual Activity  . Alcohol use: No  . Drug use: No  . Sexual activity: Not Currently    Partners: Male    Birth control/protection: Post-menopausal, Surgical    Comment: hysterectomy  Other Topics Concern  . Not on file  Social History Narrative   Widowed mother of 2, grandmother 12 with 2 great-grandchildren.      Tries to workout at the [facility] several days a week 30 minutes at a time on stairstepper or elliptical.   Social Determinants of Health   Financial Resource Strain:   . Difficulty of Paying Living Expenses:   Food Insecurity:   . Worried About Charity fundraiser in the Last Year:   . Arboriculturist in the Last Year:   Transportation Needs:   . Film/video editor (Medical):   Marland Kitchen Lack of Transportation (Non-Medical):   Physical Activity:   . Days of Exercise per Week:   . Minutes of Exercise per Session:   Stress:   . Feeling of Stress :   Social Connections:   . Frequency of Communication with Friends and Family:   . Frequency of Social Gatherings with Friends and Family:   . Attends Religious Services:   . Active Member of Clubs or Organizations:   . Attends Archivist Meetings:   Marland Kitchen Marital Status:   Intimate Partner Violence:   . Fear of  Current or Ex-Partner:   . Emotionally Abused:   Marland Kitchen Physically Abused:   . Sexually Abused:    Functional Status Survey:    Allergies  Allergen Reactions  . Sulfasalazine Anaphylaxis  . Aspirin Nausea Only  . Penicillins Diarrhea  . Sulfa Antibiotics     Pertinent  Health Maintenance Due  Topic Date Due  . PNA vac Low Risk Adult (1 of 2 - PCV13) Never done  . INFLUENZA VACCINE  07/09/2020  . DEXA SCAN  Completed    Medications: Allergies as of 06/05/2020      Reactions   Sulfasalazine Anaphylaxis   Aspirin Nausea Only   Penicillins Diarrhea   Sulfa Antibiotics       Medication List       Accurate as of June 05, 2020  2:58 PM. If you have any questions, ask your nurse or doctor.        STOP taking these medications   lisinopril 10 MG tablet Commonly known as: ZESTRIL Stopped by: Granville Lewis, PA-C   Systane Ultra 0.4-0.3 % Soln Generic drug: Polyethyl Glycol-Propyl Glycol Stopped by: Granville Lewis, PA-C     TAKE these medications   Azelastine HCl 137 MCG/SPRAY Soln Place 1 spray into both nostrils 2 (two) times daily as needed (As needed for allergies).   Biotin 10000 MCG Tabs Take 1,000 mcg by mouth daily.   bisacodyl 10 MG suppository Commonly known as: DULCOLAX Place 10 mg rectally daily as needed for moderate constipation.   Calcium-Vitamin D3 250-125 MG-UNIT Tabs Take 1 tablet by mouth 2 (two) times daily.   folic acid 1 MG tablet Commonly known as: FOLVITE Take 2 mg by mouth daily.   ICAPS AREDS FORMULA PO Take 1 capsule by mouth daily.   KEVZARA Hooker Inject 200 mg into the skin. Once every other week   levothyroxine 25 MCG tablet Commonly known as: SYNTHROID Take 25 mcg by mouth daily.   losartan 25 MG tablet Commonly known as: COZAAR Take 25 mg by mouth daily.   LUBRICATING EYE DROPS OP Place 1 drop into both eyes 2 (two) times daily. For Eye Dryness   Magnesium 500 MG Tabs Take 250 tablets by mouth every evening. 0.5 tablet     magnesium hydroxide 400 MG/5ML suspension Commonly known as: MILK OF MAGNESIA Take 30 mLs by mouth daily as needed for mild constipation.   methocarbamol 500 MG tablet Commonly known as: ROBAXIN Take 500 mg by mouth every 8 (eight) hours as needed for muscle spasms.   methocarbamol 500 MG tablet Commonly known as: ROBAXIN Take 500 mg by mouth 3 (three) times daily. ( WITH 1ST ADMINISTRATION EARLY AM)   methotrexate 2.5 MG tablet Take 15 mg by mouth every Thursday. Give 6 tablets to = 15 mg   NON FORMULARY DIET: LIBERALIZE DIET TO REGULAR   polyethylene glycol 17 g packet Commonly known as: MIRALAX / GLYCOLAX Take 17 g by mouth daily.   senna-docusate 8.6-50 MG tablet Commonly known as: Senokot-S Take 1 tablet by mouth 2 (two) times daily.   sodium chloride 1 g tablet Take 1 g by mouth 3 (three) times daily with meals.   SODIUM PHOSPHATES RE Place 1 each rectally daily as needed.   Tylenol 325 MG tablet Generic drug: acetaminophen Take 650 mg by mouth every 6 (six) hours as needed.   acetaminophen 325 MG tablet Commonly known as: TYLENOL Take 650 mg by mouth every 8 (eight) hours.   Vitamin D (Ergocalciferol) 1.25 MG (50000 UNIT) Caps capsule Commonly known as: DRISDOL Take 50,000 Units by mouth every 7 (seven) days.   Voltaren 1 % Gel Generic drug: diclofenac Sodium APPLY 2 GMS TOPICALLY TO AFFECTED AREAS (RIGHT HIP) TWICE DAILY AS NEEDED FOR PAIN       Review of Systems   General she is not complaining of fever or chills.  Skin does not complain of rashes itching or diaphoresis.  Head ears eyes nose mouth and throat is not complain of visual changes or sore throat neuro she does report a history of nasal drainage.  Respiratory does not complain of shortness of breath has somewhat of a chronic dry cough.  Cardiac is not complaining of chest pain or increasing edema from baseline.  GI is not complaining of abdominal pain nausea vomiting diarrhea  constipation.  GU does not complain of dysuria.  Musculoskeletal does have extensive weakness with history of MS--as well as history of recent fractures-at this point pain appears to be controlled she does have Robaxin 3 times a day as well as Tylenol.  And Voltaren gel  Neurologic does not complain of dizziness headache or syncope does have a history of MS and weakness.  And psych is not complaining of being depressed or anxious appears to be in good spirits despite the numerous challenges she has faced  Vitals:   06/05/20 1455  BP: 130/71  Pulse: 70  Resp: 18  Temp: (!) 97.1 F (36.2 C)  TempSrc: Oral  SpO2: 98%  Weight: 121 lb 3.2 oz (55 kg)  Height: 5\' 5"  (1.651 m)   Body mass index is 20.17 kg/m. Physical Exam  General this is a pleasant elderly female in no distress sitting comfortably in her wheelchair.  Her skin is warm and dry.  Eyes visual acuity appears to be intact sclera and conjunctive are clear.  Oropharynx clear mucous membranes moist.  Chest is clear to auscultation there is no labored breathing.  Heart is regular rate and rhythm without murmur gallop or rub she has chronic baseline lower extremity edema.  Abdomen is soft nontender with positive bowel sounds.  Musculoskeletal continues to have significant lower extremity weakness especially on the right-exam was somewhat limited secondary to patient positioning in wheelchair.  Moves her upper extremities at baseline strength.  Neurologic appears grossly intact she does have weakness lower extremities cannot really appreciate significant lateralizing deficits.  Psych she is alert and oriented pleasant and appropriate.     Labs reviewed:  June 05, 2020.  Sodium 135 potassium 4.1 BUN 16.9 creatinine 0.46.  Magnesium 1.9.  May 30, 2020.  Sodium 138 potassium 4.1 BUN 18 creatinine 0.46.  Liver function tests within normal limits.  WBC 4.5 hemoglobin 13.0 platelets 210.  Magnesium was  2.1.   Basic Metabolic Panel: Recent Labs    03/22/20 2229 03/22/20 2229 03/23/20 0805 04/04/20 0000 05/11/20 0000 05/12/20 0000 05/22/20 0000  NA 134*   < > 137   < > 132* 135* 137  K 4.0   < > 3.9   < > 4.4 4.4 4.3  CL 100   < > 104   < > 97* 99 102  CO2 25   < > 25   < > 25* 23* 25*  GLUCOSE 108*  --  100*  --   --   --   --   BUN 26*   < > 20   < > 13 9 13   CREATININE 0.63   < > 0.63   < > 0.5 0.5 0.5  CALCIUM 9.7   < > 8.8*   < > 10.0 9.6 9.8   < > = values in this interval not displayed.   Liver Function Tests: Recent Labs    03/23/20 0805 05/03/20 0000  AST 50* 29  ALT 33 24  ALKPHOS 56 136*  BILITOT 1.0  --   PROT 5.4*  --   ALBUMIN 3.7 4.0   No results for input(s): LIPASE, AMYLASE in the last 8760 hours. No results for input(s): AMMONIA in the last 8760 hours. CBC: Recent Labs    03/22/20 2229 04/04/20 0000 05/03/20 0000 05/11/20 0000 05/12/20 0000  WBC 6.8   < > 4.2 4.1 4.2  NEUTROABS 2.7  --   --   --   --   HGB  13.5   < > 13.1 12.6 13.3  HCT 40.0   < > 38 36 38  MCV 98.5  --   --   --   --   PLT 150   < > 249 221 230   < > = values in this interval not displayed.   Cardiac Enzymes: Recent Labs    03/22/20 2229  CKTOTAL 455*   BNP: Invalid input(s): POCBNP CBG: No results for input(s): GLUCAP in the last 8760 hours.  Procedures and Imaging Studies During Stay: CT HIP RIGHT WO CONTRAST  Result Date: 05/17/2020 CLINICAL DATA:  Acetabular fracture with worsening displacement of the right femoral head EXAM: CT OF THE RIGHT HIP WITHOUT CONTRAST TECHNIQUE: Multidetector CT imaging of the right hip was performed according to the standard protocol. Multiplanar CT image reconstructions were also generated. COMPARISON:  Multiple exams, including CT right hip from 03/15/2020 FINDINGS: Bones/Joint/Cartilage Compared to the prior CT and to the MRI of 03/23/2020, there has been substantial displacement and comminution of the fracture component extending  through the right acetabular roof and also involving the quadrilateral plate, the base of the superior pubic ramus, and extending longitudinally through the posterior column and into the ischium. A sclerotic band through the midportion of the right inferior pubic ramus on image 116/2 is new and indicates a component of the anterior column part of the fracture. There is been 4 mm of medial displacement of the quadrilateral plate fragment as shown on image 76/2, as well as flattening and displacement of the acetabular roof fragments. There has been interval resorption of the anterior half of the right femoral head, a rapid development given the lack of such a finding on prior MRI 03/23/2020. There is no prior such femoral head fracture on 03/23/2020, and accordingly this is felt to have occurred in the interim. There is a osteoid and woven bone deposition along the fracture margins in the adjacent soft tissues, and also some extent within the joint itself. Some of the woven bone type of appearance in the joint may reflect part of the original resorbing femoral head. Old healed fractures of the left pubic rami.  Bone island at L5. Large right hip joint effusion containing some of the immature bone noted above. Ligaments Suboptimally assessed by CT. Muscles and Tendons Fluid density tracks up adjacent to the gluteus minimus muscle posterior to the right iliac bone. Small amount of calcification in the distal iloipsoas. Soft tissues Mild presacral edema. Right kidney lower pole exophytic hypodense lesion, likely a cyst. Right iliac artery atherosclerotic calcification. IMPRESSION: 1. Progression of right acetabular and femoral head pathology including comminuted fractures involving the acetabular roof, quadrilateral plate, posterior column, and also with anterior column involved. As compared to prior MRI and CT, these fractures are now mildly displaced, with associated adjacent woven bone, and with new resorption of the  anterior half of the humeral head within the somewhat expanded acetabular socket. There is woven bone within the joint as well as a prominent joint effusion on the right. Strictly speaking, given the rapidity and severity of progression, joint infection is not readily excluded, correlate with symptoms in determining whether arthrocentesis is indicated. 2. There is a newly appreciable sclerotic band through the midportion of the right inferior pubic ramus on image 116/2 and indicates a component of the anterior column part of the fracture. 3. Old healed fractures of the left pubic rami. 4. Fluid density tracks up adjacent to the gluteus minimus muscle posterior to the right  iliac bone. 5. Atherosclerosis. 6. Right kidney lower pole exophytic hypodense lesion, likely a cyst. Electronically Signed   By: Van Clines M.D.   On: 05/17/2020 11:11   XR HIP UNILAT W OR W/O PELVIS 2-3 VIEWS RIGHT  Result Date: 05/16/2020 Radiographs of the right hip show further progression of the protrusion of the right femoral head with progressive destruction of the nondisplaced acetabular fracture on the right.    Assessment/Plan:    #1  History of closed displaced fracture of the right acetabulum-.  It has gone from nondisplaced to displaced-she has had increased pain -- this appears at this point to be relatively controlled on Tylenol-Robaxin as well as Voltaren gel.  She does have orders for continued therapy with follow-up by orthopedics.  She is to be nonweightbearing on the right.  She will need PT and OT when she is discharged.  She also will need assistance of DME with her continued debility complicated with a history of MS and rheumatoid arthritis.  This will include a standard wheelchair with swing away legs-semielectric hospital bed with half rails.  She continues to have a good positive attitude.  2.  History of rheumatoid arthritis she continues on methotrexate 15 mg q. weekly as well as folic  acid.  3.  History of hyponatremia and she is on NaCl 1 gm--- 3 times daily-this appears relatively stable with a sodium of 135 today-this will need follow-up by primary care provider.  4.  History of hypothyroidism she is on Synthroid 25 mcg a day TSH was 2.95 in the hospital on May 03, 2020 lab.  5.  History of hypertension her lisinopril was switched to Cozaar secondary to dry cough concerns-this appears to be stable recent systolics ranging from 633 up to 143.  6.  History of dry cough this was thought due to either to chronic nasal drainage or the lisinopril-she says this has not gotten any worse but still is somewhat persistent she has been started on a Azelastine spray twice daily as needed.  7.  History of low magnesium she is on low-dose magnesium magnesium level was 1.9 on today's lab-follow-up with primary care provider as needed.  8.  History of MS she is on Kevezara injections every other week.  Again she will be going home she will have assistance at home she assures me-she will need PT and OT as well as DME including a standard wheelchair with swing away legs and semielectric hospital bed with half rails.  She will need expedient primary care provider as well.  [id number] note greater than 30 minutes spent on this discharge-greater than 50% of time spent coordinating and formulating plan of care for numerous diagnoses

## 2020-06-06 ENCOUNTER — Encounter: Payer: Self-pay | Admitting: Internal Medicine

## 2020-06-06 MED ORDER — DICLOFENAC SODIUM 1 % EX GEL: CUTANEOUS | 0 refills | Status: DC

## 2020-06-06 MED ORDER — KEVZARA 200 MG/1.14ML ~~LOC~~ SOSY
200.0000 mg | PREFILLED_SYRINGE | Freq: Every day | SUBCUTANEOUS | 0 refills | Status: DC
Start: 2020-06-06 — End: 2021-06-07

## 2020-06-06 MED ORDER — LEVOTHYROXINE SODIUM 25 MCG PO TABS: 25.0000 ug | ORAL_TABLET | Freq: Every day | ORAL | 0 refills | Status: AC

## 2020-06-06 MED ORDER — METHOTREXATE SODIUM 2.5 MG PO TABS: 15.0000 mg | ORAL_TABLET | ORAL | 0 refills | Status: DC

## 2020-06-06 MED ORDER — METHOCARBAMOL 500 MG PO TABS: 500.0000 mg | ORAL_TABLET | Freq: Three times a day (TID) | ORAL | 0 refills | Status: DC

## 2020-06-06 MED ORDER — AZELASTINE HCL 137 MCG/SPRAY NA SOLN: 1.0000 | Freq: Two times a day (BID) | NASAL | 0 refills | Status: DC | PRN

## 2020-06-06 MED ORDER — SODIUM CHLORIDE 1 G PO TABS: 1.0000 g | ORAL_TABLET | Freq: Three times a day (TID) | ORAL | 0 refills | Status: DC

## 2020-06-06 MED ORDER — LOSARTAN POTASSIUM 25 MG PO TABS: 25.0000 mg | ORAL_TABLET | Freq: Every day | ORAL | 0 refills | Status: DC

## 2020-06-08 ENCOUNTER — Telehealth: Payer: Self-pay

## 2020-06-08 DIAGNOSIS — M130 Polyarthritis, unspecified: Secondary | ICD-10-CM | POA: Diagnosis not present

## 2020-06-08 DIAGNOSIS — M316 Other giant cell arteritis: Secondary | ICD-10-CM | POA: Diagnosis not present

## 2020-06-08 DIAGNOSIS — M8580 Other specified disorders of bone density and structure, unspecified site: Secondary | ICD-10-CM | POA: Diagnosis not present

## 2020-06-08 DIAGNOSIS — R32 Unspecified urinary incontinence: Secondary | ICD-10-CM | POA: Diagnosis not present

## 2020-06-08 DIAGNOSIS — G959 Disease of spinal cord, unspecified: Secondary | ICD-10-CM | POA: Diagnosis not present

## 2020-06-08 DIAGNOSIS — G35 Multiple sclerosis: Secondary | ICD-10-CM | POA: Diagnosis not present

## 2020-06-08 DIAGNOSIS — M80051D Age-related osteoporosis with current pathological fracture, right femur, subsequent encounter for fracture with routine healing: Secondary | ICD-10-CM | POA: Diagnosis not present

## 2020-06-08 DIAGNOSIS — I119 Hypertensive heart disease without heart failure: Secondary | ICD-10-CM | POA: Diagnosis not present

## 2020-06-08 DIAGNOSIS — E871 Hypo-osmolality and hyponatremia: Secondary | ICD-10-CM | POA: Diagnosis not present

## 2020-06-08 DIAGNOSIS — H9190 Unspecified hearing loss, unspecified ear: Secondary | ICD-10-CM | POA: Diagnosis not present

## 2020-06-08 DIAGNOSIS — R296 Repeated falls: Secondary | ICD-10-CM | POA: Diagnosis not present

## 2020-06-08 DIAGNOSIS — E034 Atrophy of thyroid (acquired): Secondary | ICD-10-CM | POA: Diagnosis not present

## 2020-06-08 DIAGNOSIS — M21372 Foot drop, left foot: Secondary | ICD-10-CM | POA: Diagnosis not present

## 2020-06-08 DIAGNOSIS — R600 Localized edema: Secondary | ICD-10-CM | POA: Diagnosis not present

## 2020-06-08 DIAGNOSIS — Z90710 Acquired absence of both cervix and uterus: Secondary | ICD-10-CM | POA: Diagnosis not present

## 2020-06-08 DIAGNOSIS — M069 Rheumatoid arthritis, unspecified: Secondary | ICD-10-CM | POA: Diagnosis not present

## 2020-06-08 DIAGNOSIS — Z9181 History of falling: Secondary | ICD-10-CM | POA: Diagnosis not present

## 2020-06-08 DIAGNOSIS — Z993 Dependence on wheelchair: Secondary | ICD-10-CM | POA: Diagnosis not present

## 2020-06-08 DIAGNOSIS — W19XXXD Unspecified fall, subsequent encounter: Secondary | ICD-10-CM | POA: Diagnosis not present

## 2020-06-08 DIAGNOSIS — I2510 Atherosclerotic heart disease of native coronary artery without angina pectoris: Secondary | ICD-10-CM | POA: Diagnosis not present

## 2020-06-08 DIAGNOSIS — M800AXG Age-related osteoporosis with current pathological fracture, other site, subsequent encounter for fracture with delayed healing: Secondary | ICD-10-CM | POA: Diagnosis not present

## 2020-06-08 DIAGNOSIS — I872 Venous insufficiency (chronic) (peripheral): Secondary | ICD-10-CM | POA: Diagnosis not present

## 2020-06-08 DIAGNOSIS — R05 Cough: Secondary | ICD-10-CM | POA: Diagnosis not present

## 2020-06-08 DIAGNOSIS — Z8781 Personal history of (healed) traumatic fracture: Secondary | ICD-10-CM | POA: Diagnosis not present

## 2020-06-08 NOTE — Telephone Encounter (Signed)
[name] from [facility] wanted to know if he has any objective of her using  hoyer left .   best contact # [phone] . If not answered please leave a message.

## 2020-06-08 NOTE — Telephone Encounter (Signed)
frankie from [facility] is Looking for verbal orders for home health PT for patient.   1 week 1   2 week 8

## 2020-06-09 NOTE — Telephone Encounter (Signed)
I called and lm on vm to advise that ok per [facility] for her to be able to use the lift.

## 2020-06-13 DIAGNOSIS — M069 Rheumatoid arthritis, unspecified: Secondary | ICD-10-CM | POA: Diagnosis not present

## 2020-06-13 DIAGNOSIS — M80051D Age-related osteoporosis with current pathological fracture, right femur, subsequent encounter for fracture with routine healing: Secondary | ICD-10-CM | POA: Diagnosis not present

## 2020-06-13 DIAGNOSIS — M21372 Foot drop, left foot: Secondary | ICD-10-CM | POA: Diagnosis not present

## 2020-06-13 DIAGNOSIS — G35 Multiple sclerosis: Secondary | ICD-10-CM | POA: Diagnosis not present

## 2020-06-13 DIAGNOSIS — M800AXG Age-related osteoporosis with current pathological fracture, other site, subsequent encounter for fracture with delayed healing: Secondary | ICD-10-CM | POA: Diagnosis not present

## 2020-06-13 DIAGNOSIS — M130 Polyarthritis, unspecified: Secondary | ICD-10-CM | POA: Diagnosis not present

## 2020-06-14 DIAGNOSIS — G35 Multiple sclerosis: Secondary | ICD-10-CM | POA: Diagnosis not present

## 2020-06-14 DIAGNOSIS — M069 Rheumatoid arthritis, unspecified: Secondary | ICD-10-CM | POA: Diagnosis not present

## 2020-06-14 DIAGNOSIS — M130 Polyarthritis, unspecified: Secondary | ICD-10-CM | POA: Diagnosis not present

## 2020-06-14 DIAGNOSIS — M800AXG Age-related osteoporosis with current pathological fracture, other site, subsequent encounter for fracture with delayed healing: Secondary | ICD-10-CM | POA: Diagnosis not present

## 2020-06-14 DIAGNOSIS — M80051D Age-related osteoporosis with current pathological fracture, right femur, subsequent encounter for fracture with routine healing: Secondary | ICD-10-CM | POA: Diagnosis not present

## 2020-06-14 DIAGNOSIS — M21372 Foot drop, left foot: Secondary | ICD-10-CM | POA: Diagnosis not present

## 2020-06-16 DIAGNOSIS — M21372 Foot drop, left foot: Secondary | ICD-10-CM | POA: Diagnosis not present

## 2020-06-16 DIAGNOSIS — M80051D Age-related osteoporosis with current pathological fracture, right femur, subsequent encounter for fracture with routine healing: Secondary | ICD-10-CM | POA: Diagnosis not present

## 2020-06-16 DIAGNOSIS — M069 Rheumatoid arthritis, unspecified: Secondary | ICD-10-CM | POA: Diagnosis not present

## 2020-06-16 DIAGNOSIS — M800AXG Age-related osteoporosis with current pathological fracture, other site, subsequent encounter for fracture with delayed healing: Secondary | ICD-10-CM | POA: Diagnosis not present

## 2020-06-16 DIAGNOSIS — M130 Polyarthritis, unspecified: Secondary | ICD-10-CM | POA: Diagnosis not present

## 2020-06-16 DIAGNOSIS — G35 Multiple sclerosis: Secondary | ICD-10-CM | POA: Diagnosis not present

## 2020-06-19 DIAGNOSIS — M800AXG Age-related osteoporosis with current pathological fracture, other site, subsequent encounter for fracture with delayed healing: Secondary | ICD-10-CM | POA: Diagnosis not present

## 2020-06-19 DIAGNOSIS — M80051D Age-related osteoporosis with current pathological fracture, right femur, subsequent encounter for fracture with routine healing: Secondary | ICD-10-CM | POA: Diagnosis not present

## 2020-06-19 DIAGNOSIS — M130 Polyarthritis, unspecified: Secondary | ICD-10-CM | POA: Diagnosis not present

## 2020-06-19 DIAGNOSIS — G35 Multiple sclerosis: Secondary | ICD-10-CM | POA: Diagnosis not present

## 2020-06-19 DIAGNOSIS — M069 Rheumatoid arthritis, unspecified: Secondary | ICD-10-CM | POA: Diagnosis not present

## 2020-06-19 DIAGNOSIS — M21372 Foot drop, left foot: Secondary | ICD-10-CM | POA: Diagnosis not present

## 2020-06-20 DIAGNOSIS — G35 Multiple sclerosis: Secondary | ICD-10-CM | POA: Diagnosis not present

## 2020-06-20 DIAGNOSIS — W19XXXS Unspecified fall, sequela: Secondary | ICD-10-CM | POA: Diagnosis not present

## 2020-06-20 DIAGNOSIS — M800AXG Age-related osteoporosis with current pathological fracture, other site, subsequent encounter for fracture with delayed healing: Secondary | ICD-10-CM | POA: Diagnosis not present

## 2020-06-20 DIAGNOSIS — R296 Repeated falls: Secondary | ICD-10-CM | POA: Diagnosis not present

## 2020-06-20 DIAGNOSIS — S32421G Displaced fracture of posterior wall of right acetabulum, subsequent encounter for fracture with delayed healing: Secondary | ICD-10-CM | POA: Diagnosis not present

## 2020-06-20 DIAGNOSIS — M130 Polyarthritis, unspecified: Secondary | ICD-10-CM | POA: Diagnosis not present

## 2020-06-20 DIAGNOSIS — E46 Unspecified protein-calorie malnutrition: Secondary | ICD-10-CM | POA: Diagnosis not present

## 2020-06-20 DIAGNOSIS — R262 Difficulty in walking, not elsewhere classified: Secondary | ICD-10-CM | POA: Diagnosis not present

## 2020-06-20 DIAGNOSIS — M069 Rheumatoid arthritis, unspecified: Secondary | ICD-10-CM | POA: Diagnosis not present

## 2020-06-20 DIAGNOSIS — M21372 Foot drop, left foot: Secondary | ICD-10-CM | POA: Diagnosis not present

## 2020-06-20 DIAGNOSIS — M80051D Age-related osteoporosis with current pathological fracture, right femur, subsequent encounter for fracture with routine healing: Secondary | ICD-10-CM | POA: Diagnosis not present

## 2020-06-20 DIAGNOSIS — S72001G Fracture of unspecified part of neck of right femur, subsequent encounter for closed fracture with delayed healing: Secondary | ICD-10-CM | POA: Diagnosis not present

## 2020-06-20 DIAGNOSIS — R54 Age-related physical debility: Secondary | ICD-10-CM | POA: Diagnosis not present

## 2020-06-22 DIAGNOSIS — M069 Rheumatoid arthritis, unspecified: Secondary | ICD-10-CM | POA: Diagnosis not present

## 2020-06-22 DIAGNOSIS — G35 Multiple sclerosis: Secondary | ICD-10-CM | POA: Diagnosis not present

## 2020-06-22 DIAGNOSIS — M800AXG Age-related osteoporosis with current pathological fracture, other site, subsequent encounter for fracture with delayed healing: Secondary | ICD-10-CM | POA: Diagnosis not present

## 2020-06-22 DIAGNOSIS — M21372 Foot drop, left foot: Secondary | ICD-10-CM | POA: Diagnosis not present

## 2020-06-22 DIAGNOSIS — M130 Polyarthritis, unspecified: Secondary | ICD-10-CM | POA: Diagnosis not present

## 2020-06-22 DIAGNOSIS — M80051D Age-related osteoporosis with current pathological fracture, right femur, subsequent encounter for fracture with routine healing: Secondary | ICD-10-CM | POA: Diagnosis not present

## 2020-06-23 DIAGNOSIS — M800AXG Age-related osteoporosis with current pathological fracture, other site, subsequent encounter for fracture with delayed healing: Secondary | ICD-10-CM | POA: Diagnosis not present

## 2020-06-23 DIAGNOSIS — M069 Rheumatoid arthritis, unspecified: Secondary | ICD-10-CM | POA: Diagnosis not present

## 2020-06-23 DIAGNOSIS — M21372 Foot drop, left foot: Secondary | ICD-10-CM | POA: Diagnosis not present

## 2020-06-23 DIAGNOSIS — M130 Polyarthritis, unspecified: Secondary | ICD-10-CM | POA: Diagnosis not present

## 2020-06-23 DIAGNOSIS — G35 Multiple sclerosis: Secondary | ICD-10-CM | POA: Diagnosis not present

## 2020-06-23 DIAGNOSIS — M80051D Age-related osteoporosis with current pathological fracture, right femur, subsequent encounter for fracture with routine healing: Secondary | ICD-10-CM | POA: Diagnosis not present

## 2020-06-26 ENCOUNTER — Ambulatory Visit (INDEPENDENT_AMBULATORY_CARE_PROVIDER_SITE_OTHER): Payer: Medicare Other | Admitting: Orthopedic Surgery

## 2020-06-26 ENCOUNTER — Encounter: Payer: Self-pay | Admitting: Orthopedic Surgery

## 2020-06-26 ENCOUNTER — Other Ambulatory Visit: Payer: Self-pay

## 2020-06-26 VITALS — Ht 65.0 in | Wt 121.0 lb

## 2020-06-26 DIAGNOSIS — I87323 Chronic venous hypertension (idiopathic) with inflammation of bilateral lower extremity: Secondary | ICD-10-CM | POA: Diagnosis not present

## 2020-06-26 DIAGNOSIS — G35 Multiple sclerosis: Secondary | ICD-10-CM | POA: Diagnosis not present

## 2020-06-26 DIAGNOSIS — S72001G Fracture of unspecified part of neck of right femur, subsequent encounter for closed fracture with delayed healing: Secondary | ICD-10-CM | POA: Diagnosis not present

## 2020-06-26 DIAGNOSIS — M810 Age-related osteoporosis without current pathological fracture: Secondary | ICD-10-CM

## 2020-06-26 DIAGNOSIS — S32421G Displaced fracture of posterior wall of right acetabulum, subsequent encounter for fracture with delayed healing: Secondary | ICD-10-CM | POA: Diagnosis not present

## 2020-06-26 DIAGNOSIS — M21372 Foot drop, left foot: Secondary | ICD-10-CM | POA: Diagnosis not present

## 2020-06-26 DIAGNOSIS — M800AXG Age-related osteoporosis with current pathological fracture, other site, subsequent encounter for fracture with delayed healing: Secondary | ICD-10-CM | POA: Diagnosis not present

## 2020-06-26 DIAGNOSIS — M130 Polyarthritis, unspecified: Secondary | ICD-10-CM | POA: Diagnosis not present

## 2020-06-26 DIAGNOSIS — R296 Repeated falls: Secondary | ICD-10-CM | POA: Diagnosis not present

## 2020-06-26 DIAGNOSIS — M80051D Age-related osteoporosis with current pathological fracture, right femur, subsequent encounter for fracture with routine healing: Secondary | ICD-10-CM | POA: Diagnosis not present

## 2020-06-26 DIAGNOSIS — M069 Rheumatoid arthritis, unspecified: Secondary | ICD-10-CM | POA: Diagnosis not present

## 2020-06-27 ENCOUNTER — Encounter: Payer: Self-pay | Admitting: Orthopedic Surgery

## 2020-06-27 ENCOUNTER — Telehealth: Payer: Self-pay | Admitting: Orthopedic Surgery

## 2020-06-27 DIAGNOSIS — M800AXG Age-related osteoporosis with current pathological fracture, other site, subsequent encounter for fracture with delayed healing: Secondary | ICD-10-CM | POA: Diagnosis not present

## 2020-06-27 DIAGNOSIS — G35 Multiple sclerosis: Secondary | ICD-10-CM | POA: Diagnosis not present

## 2020-06-27 DIAGNOSIS — M21372 Foot drop, left foot: Secondary | ICD-10-CM | POA: Diagnosis not present

## 2020-06-27 DIAGNOSIS — M80051D Age-related osteoporosis with current pathological fracture, right femur, subsequent encounter for fracture with routine healing: Secondary | ICD-10-CM | POA: Diagnosis not present

## 2020-06-27 DIAGNOSIS — M130 Polyarthritis, unspecified: Secondary | ICD-10-CM | POA: Diagnosis not present

## 2020-06-27 DIAGNOSIS — M069 Rheumatoid arthritis, unspecified: Secondary | ICD-10-CM | POA: Diagnosis not present

## 2020-06-27 NOTE — Progress Notes (Signed)
Office Visit Note   Patient: [name]           Date of Birth: Aug 16, 1932           MRN: [id number] Visit Date: 06/26/2020              Requested by: [name], MD [address] [place],  [place] [address] PCP: [name], MD  Chief Complaint  Patient presents with   Right Hip - Follow-up      HPI: Patient is an 86 year old woman currently at skilled nursing she is status post a closed right acetabular fracture and subsequent humeral head fracture on the right hip secondary to osteoporosis and multiple falls.  Patient states she does have muscle spasms at times and has been using Robaxin.  Patient complains of swelling in her lower extremities she states she does have compression stockings but is not using them stating they do cause pain she has used Voltaren gel as needed.  Assessment & Plan: Visit Diagnoses:  1. Closed displaced fracture of posterior wall of right acetabulum with delayed healing, subsequent encounter   2. Closed fracture of neck of right femur with delayed healing, subsequent encounter   3. Age-related osteoporosis without current pathological fracture   4. Multiple falls   5. MS (multiple sclerosis) ([place])   6. Idiopathic chronic venous hypertension of both lower extremities with inflammation     Plan: Recommend patient use her compression stockings for the chronic venous ulcers discussed that she could start with limited time daily and slowly increase the time that she wears the stockings.  Patient was given a note to begin physical therapy for progressive ambulation weightbearing as tolerated both lower extremities.  Recommended the patient could use coconut water combined with a protein supplement to help with the muscle cramping and strength.  Follow-Up Instructions: Return in about 4 weeks (around 07/24/2020).   Ortho Exam  Patient is alert, oriented, no adenopathy, well-dressed, normal affect, normal respiratory  effort. Examination patient does not have pain with passive range of motion of the right hip.  Previous radiographs do show callus bony formation with consolidation of the acetabular fracture.  Patient does have chronic venous insufficiency both lower extremities with swelling worse in the left leg than the right leg there is no open ulcers no cellulitis no drainage.  There is significant pitting edema worse on the left lower extremity.  Imaging: No results found. No images are attached to the encounter.  Labs: Lab Results  Component Value Date   ESRSEDRATE 108 (H) 04/03/2010   REPTSTATUS 03/24/2020 FINAL 03/23/2020   CULT (A) 03/23/2020    <10,000 COLONIES/mL INSIGNIFICANT GROWTH Performed at [facility] [address] [place], [place] [address]      Lab Results  Component Value Date   ALBUMIN 4.0 05/30/2020   ALBUMIN 4.0 05/03/2020   ALBUMIN 3.7 03/23/2020    No results found for: MG No results found for: VD25OH  No results found for: PREALBUMIN CBC EXTENDED Latest Ref Rng & Units 05/30/2020 05/12/2020 05/11/2020  WBC - 4.5 4.2 4.1  RBC 3.87 - 5.11 3.87 3.95 3.78(A)  HGB 12.0 - 16.0 13.0 13.3 12.6  HCT 36 - 46 39 38 36  PLT 150 - 399 - 230 221  NEUTROABS 1.7 - 7.7 K/uL - - -  LYMPHSABS 0.7 - 4.0 K/uL - - -     Body mass index is 20.14 kg/m.  Orders:  No orders of  the defined types were placed in this encounter.  No orders of the defined types were placed in this encounter.    Procedures: No procedures performed  Clinical Data: No additional findings.  ROS:  All other systems negative, except as noted in the HPI. Review of Systems  Objective: Vital Signs: Ht 5\' 5"  (1.651 m)    Wt 121 lb (54.9 kg)    LMP 09/08/1970 (Approximate)    BMI 20.14 kg/m   Specialty Comments:  No specialty comments available.  PMFS History: Patient Active Problem List   Diagnosis Date Noted   Hyponatremia 04/09/2020   Acetabular fracture ([place]) 04/01/2020    Closed right ischial fracture ([place]) 04/01/2020   Hypertension 04/01/2020   Hypothyroidism 04/01/2020   Frequent falls 03/23/2020   RA (rheumatoid arthritis) ([place]) 03/23/2020   Tympanic membrane central perforation 12/15/2019   Cholesteatoma of external auditory canal, right 09/10/2019   Hearing loss 09/10/2019   Chronic venous insufficiency of lower extremity 01/25/2019   Grade II diastolic dysfunction 06/19/1974   Urinary dysfunction 12/22/2017   Multiple sclerosis, primary progressive ([place]) 06/19/2017   Left foot drop 03/17/2017   Primary chronic progressive multiple sclerosis ([place]) 03/17/2017   Myelopathy ([place]) 03/17/2017   Bilateral lower extremity edema 03/17/2017   Past Medical History:  Diagnosis Date   Chronic venous insufficiency of lower extremity 01/25/2019   With chronic bilateral lower extremity edema   Incontinent of urine    Multiple sclerosis ([place]) 2015   Osteoporosis    osteopenia   Polyarthritis    Post-menopausal    Rheumatoid arthritis ([place])    Temporal arteritis ([place]) 2011    Family History  Problem Relation Age of Onset   Osteoarthritis Father    Stroke Father    Heart failure Mother    Hypertension Mother    Heart disease Mother    Multiple sclerosis Cousin    Multiple sclerosis Cousin    Multiple sclerosis Cousin     Past Surgical History:  Procedure Laterality Date   ABDOMINAL HYSTERECTOMY  1971   TVH   breast ductectomy Right 05/1995   COLONOSCOPY  10/2011   Social History   Occupational History   Not on file  Tobacco Use   Smoking status: Never Smoker   Smokeless tobacco: Never Used  Substance and Sexual Activity   Alcohol use: No   Drug use: No   Sexual activity: Not Currently    Partners: Male    Birth control/protection: Post-menopausal, Surgical    Comment: hysterectomy

## 2020-06-27 NOTE — Telephone Encounter (Signed)
Pt called stating she would like a rx for an electrical bed so that she doesn't have to keep manually lowering and lifting her bed.    [phone]

## 2020-06-27 NOTE — Telephone Encounter (Signed)
Order in parachute for electric bed. Will hold message pending approval

## 2020-06-28 DIAGNOSIS — M21372 Foot drop, left foot: Secondary | ICD-10-CM | POA: Diagnosis not present

## 2020-06-28 DIAGNOSIS — M130 Polyarthritis, unspecified: Secondary | ICD-10-CM | POA: Diagnosis not present

## 2020-06-28 DIAGNOSIS — M80051D Age-related osteoporosis with current pathological fracture, right femur, subsequent encounter for fracture with routine healing: Secondary | ICD-10-CM | POA: Diagnosis not present

## 2020-06-28 DIAGNOSIS — M800AXG Age-related osteoporosis with current pathological fracture, other site, subsequent encounter for fracture with delayed healing: Secondary | ICD-10-CM | POA: Diagnosis not present

## 2020-06-28 DIAGNOSIS — G35 Multiple sclerosis: Secondary | ICD-10-CM | POA: Diagnosis not present

## 2020-06-28 DIAGNOSIS — M069 Rheumatoid arthritis, unspecified: Secondary | ICD-10-CM | POA: Diagnosis not present

## 2020-06-28 NOTE — Telephone Encounter (Signed)
[employer] reached out to the pt. She already has a semi electric bed from the company and insurance will not cover the fully electric bed.

## 2020-06-29 DIAGNOSIS — M130 Polyarthritis, unspecified: Secondary | ICD-10-CM | POA: Diagnosis not present

## 2020-06-29 DIAGNOSIS — M21372 Foot drop, left foot: Secondary | ICD-10-CM | POA: Diagnosis not present

## 2020-06-29 DIAGNOSIS — M80051D Age-related osteoporosis with current pathological fracture, right femur, subsequent encounter for fracture with routine healing: Secondary | ICD-10-CM | POA: Diagnosis not present

## 2020-06-29 DIAGNOSIS — M800AXG Age-related osteoporosis with current pathological fracture, other site, subsequent encounter for fracture with delayed healing: Secondary | ICD-10-CM | POA: Diagnosis not present

## 2020-06-29 DIAGNOSIS — G35 Multiple sclerosis: Secondary | ICD-10-CM | POA: Diagnosis not present

## 2020-06-29 DIAGNOSIS — M069 Rheumatoid arthritis, unspecified: Secondary | ICD-10-CM | POA: Diagnosis not present

## 2020-06-30 DIAGNOSIS — M130 Polyarthritis, unspecified: Secondary | ICD-10-CM | POA: Diagnosis not present

## 2020-06-30 DIAGNOSIS — M069 Rheumatoid arthritis, unspecified: Secondary | ICD-10-CM | POA: Diagnosis not present

## 2020-06-30 DIAGNOSIS — M21372 Foot drop, left foot: Secondary | ICD-10-CM | POA: Diagnosis not present

## 2020-06-30 DIAGNOSIS — M800AXG Age-related osteoporosis with current pathological fracture, other site, subsequent encounter for fracture with delayed healing: Secondary | ICD-10-CM | POA: Diagnosis not present

## 2020-06-30 DIAGNOSIS — M80051D Age-related osteoporosis with current pathological fracture, right femur, subsequent encounter for fracture with routine healing: Secondary | ICD-10-CM | POA: Diagnosis not present

## 2020-06-30 DIAGNOSIS — G35 Multiple sclerosis: Secondary | ICD-10-CM | POA: Diagnosis not present

## 2020-07-03 DIAGNOSIS — M069 Rheumatoid arthritis, unspecified: Secondary | ICD-10-CM | POA: Diagnosis not present

## 2020-07-03 DIAGNOSIS — M800AXG Age-related osteoporosis with current pathological fracture, other site, subsequent encounter for fracture with delayed healing: Secondary | ICD-10-CM | POA: Diagnosis not present

## 2020-07-03 DIAGNOSIS — M21372 Foot drop, left foot: Secondary | ICD-10-CM | POA: Diagnosis not present

## 2020-07-03 DIAGNOSIS — M80051D Age-related osteoporosis with current pathological fracture, right femur, subsequent encounter for fracture with routine healing: Secondary | ICD-10-CM | POA: Diagnosis not present

## 2020-07-03 DIAGNOSIS — M130 Polyarthritis, unspecified: Secondary | ICD-10-CM | POA: Diagnosis not present

## 2020-07-03 DIAGNOSIS — G35 Multiple sclerosis: Secondary | ICD-10-CM | POA: Diagnosis not present

## 2020-07-04 DIAGNOSIS — M069 Rheumatoid arthritis, unspecified: Secondary | ICD-10-CM | POA: Diagnosis not present

## 2020-07-04 DIAGNOSIS — M21372 Foot drop, left foot: Secondary | ICD-10-CM | POA: Diagnosis not present

## 2020-07-04 DIAGNOSIS — M800AXG Age-related osteoporosis with current pathological fracture, other site, subsequent encounter for fracture with delayed healing: Secondary | ICD-10-CM | POA: Diagnosis not present

## 2020-07-04 DIAGNOSIS — M80051D Age-related osteoporosis with current pathological fracture, right femur, subsequent encounter for fracture with routine healing: Secondary | ICD-10-CM | POA: Diagnosis not present

## 2020-07-04 DIAGNOSIS — M130 Polyarthritis, unspecified: Secondary | ICD-10-CM | POA: Diagnosis not present

## 2020-07-04 DIAGNOSIS — G35 Multiple sclerosis: Secondary | ICD-10-CM | POA: Diagnosis not present

## 2020-07-05 ENCOUNTER — Telehealth: Payer: Self-pay | Admitting: Orthopedic Surgery

## 2020-07-05 DIAGNOSIS — M21372 Foot drop, left foot: Secondary | ICD-10-CM | POA: Diagnosis not present

## 2020-07-05 DIAGNOSIS — M069 Rheumatoid arthritis, unspecified: Secondary | ICD-10-CM | POA: Diagnosis not present

## 2020-07-05 DIAGNOSIS — M130 Polyarthritis, unspecified: Secondary | ICD-10-CM | POA: Diagnosis not present

## 2020-07-05 DIAGNOSIS — G35 Multiple sclerosis: Secondary | ICD-10-CM | POA: Diagnosis not present

## 2020-07-05 DIAGNOSIS — M800AXG Age-related osteoporosis with current pathological fracture, other site, subsequent encounter for fracture with delayed healing: Secondary | ICD-10-CM | POA: Diagnosis not present

## 2020-07-05 DIAGNOSIS — M80051D Age-related osteoporosis with current pathological fracture, right femur, subsequent encounter for fracture with routine healing: Secondary | ICD-10-CM | POA: Diagnosis not present

## 2020-07-05 MED ORDER — ACETAMINOPHEN-CODEINE #3 300-30 MG PO TABS: 1.0000 | ORAL_TABLET | Freq: Four times a day (QID) | ORAL | 0 refills | Status: DC | PRN

## 2020-07-05 NOTE — Telephone Encounter (Signed)
Patient called. She would like some Tylenol #3 called in for pain. Says she needs something for pain after she does her exercises. Her call back number is [phone]

## 2020-07-05 NOTE — Telephone Encounter (Signed)
Erin please advise, thank you.  

## 2020-07-06 DIAGNOSIS — G35 Multiple sclerosis: Secondary | ICD-10-CM | POA: Diagnosis not present

## 2020-07-06 DIAGNOSIS — M21372 Foot drop, left foot: Secondary | ICD-10-CM | POA: Diagnosis not present

## 2020-07-06 DIAGNOSIS — M069 Rheumatoid arthritis, unspecified: Secondary | ICD-10-CM | POA: Diagnosis not present

## 2020-07-06 DIAGNOSIS — M130 Polyarthritis, unspecified: Secondary | ICD-10-CM | POA: Diagnosis not present

## 2020-07-06 DIAGNOSIS — M80051D Age-related osteoporosis with current pathological fracture, right femur, subsequent encounter for fracture with routine healing: Secondary | ICD-10-CM | POA: Diagnosis not present

## 2020-07-06 DIAGNOSIS — M800AXG Age-related osteoporosis with current pathological fracture, other site, subsequent encounter for fracture with delayed healing: Secondary | ICD-10-CM | POA: Diagnosis not present

## 2020-07-08 DIAGNOSIS — Z993 Dependence on wheelchair: Secondary | ICD-10-CM | POA: Diagnosis not present

## 2020-07-08 DIAGNOSIS — M130 Polyarthritis, unspecified: Secondary | ICD-10-CM | POA: Diagnosis not present

## 2020-07-08 DIAGNOSIS — E034 Atrophy of thyroid (acquired): Secondary | ICD-10-CM | POA: Diagnosis not present

## 2020-07-08 DIAGNOSIS — M316 Other giant cell arteritis: Secondary | ICD-10-CM | POA: Diagnosis not present

## 2020-07-08 DIAGNOSIS — R600 Localized edema: Secondary | ICD-10-CM | POA: Diagnosis not present

## 2020-07-08 DIAGNOSIS — Z9181 History of falling: Secondary | ICD-10-CM | POA: Diagnosis not present

## 2020-07-08 DIAGNOSIS — Z8781 Personal history of (healed) traumatic fracture: Secondary | ICD-10-CM | POA: Diagnosis not present

## 2020-07-08 DIAGNOSIS — W19XXXD Unspecified fall, subsequent encounter: Secondary | ICD-10-CM | POA: Diagnosis not present

## 2020-07-08 DIAGNOSIS — H9190 Unspecified hearing loss, unspecified ear: Secondary | ICD-10-CM | POA: Diagnosis not present

## 2020-07-08 DIAGNOSIS — G959 Disease of spinal cord, unspecified: Secondary | ICD-10-CM | POA: Diagnosis not present

## 2020-07-08 DIAGNOSIS — M8580 Other specified disorders of bone density and structure, unspecified site: Secondary | ICD-10-CM | POA: Diagnosis not present

## 2020-07-08 DIAGNOSIS — I2510 Atherosclerotic heart disease of native coronary artery without angina pectoris: Secondary | ICD-10-CM | POA: Diagnosis not present

## 2020-07-08 DIAGNOSIS — M21372 Foot drop, left foot: Secondary | ICD-10-CM | POA: Diagnosis not present

## 2020-07-08 DIAGNOSIS — M069 Rheumatoid arthritis, unspecified: Secondary | ICD-10-CM | POA: Diagnosis not present

## 2020-07-08 DIAGNOSIS — R296 Repeated falls: Secondary | ICD-10-CM | POA: Diagnosis not present

## 2020-07-08 DIAGNOSIS — I872 Venous insufficiency (chronic) (peripheral): Secondary | ICD-10-CM | POA: Diagnosis not present

## 2020-07-08 DIAGNOSIS — I119 Hypertensive heart disease without heart failure: Secondary | ICD-10-CM | POA: Diagnosis not present

## 2020-07-08 DIAGNOSIS — Z90710 Acquired absence of both cervix and uterus: Secondary | ICD-10-CM | POA: Diagnosis not present

## 2020-07-08 DIAGNOSIS — R05 Cough: Secondary | ICD-10-CM | POA: Diagnosis not present

## 2020-07-08 DIAGNOSIS — E871 Hypo-osmolality and hyponatremia: Secondary | ICD-10-CM | POA: Diagnosis not present

## 2020-07-08 DIAGNOSIS — G35 Multiple sclerosis: Secondary | ICD-10-CM | POA: Diagnosis not present

## 2020-07-08 DIAGNOSIS — M800AXG Age-related osteoporosis with current pathological fracture, other site, subsequent encounter for fracture with delayed healing: Secondary | ICD-10-CM | POA: Diagnosis not present

## 2020-07-08 DIAGNOSIS — R32 Unspecified urinary incontinence: Secondary | ICD-10-CM | POA: Diagnosis not present

## 2020-07-08 DIAGNOSIS — M80051D Age-related osteoporosis with current pathological fracture, right femur, subsequent encounter for fracture with routine healing: Secondary | ICD-10-CM | POA: Diagnosis not present

## 2020-07-10 DIAGNOSIS — M800AXG Age-related osteoporosis with current pathological fracture, other site, subsequent encounter for fracture with delayed healing: Secondary | ICD-10-CM | POA: Diagnosis not present

## 2020-07-10 DIAGNOSIS — M069 Rheumatoid arthritis, unspecified: Secondary | ICD-10-CM | POA: Diagnosis not present

## 2020-07-10 DIAGNOSIS — M130 Polyarthritis, unspecified: Secondary | ICD-10-CM | POA: Diagnosis not present

## 2020-07-10 DIAGNOSIS — M80051D Age-related osteoporosis with current pathological fracture, right femur, subsequent encounter for fracture with routine healing: Secondary | ICD-10-CM | POA: Diagnosis not present

## 2020-07-10 DIAGNOSIS — G35 Multiple sclerosis: Secondary | ICD-10-CM | POA: Diagnosis not present

## 2020-07-10 DIAGNOSIS — M21372 Foot drop, left foot: Secondary | ICD-10-CM | POA: Diagnosis not present

## 2020-07-11 DIAGNOSIS — H353132 Nonexudative age-related macular degeneration, bilateral, intermediate dry stage: Secondary | ICD-10-CM | POA: Diagnosis not present

## 2020-07-12 DIAGNOSIS — M80051D Age-related osteoporosis with current pathological fracture, right femur, subsequent encounter for fracture with routine healing: Secondary | ICD-10-CM | POA: Diagnosis not present

## 2020-07-12 DIAGNOSIS — M130 Polyarthritis, unspecified: Secondary | ICD-10-CM | POA: Diagnosis not present

## 2020-07-12 DIAGNOSIS — G35 Multiple sclerosis: Secondary | ICD-10-CM | POA: Diagnosis not present

## 2020-07-12 DIAGNOSIS — M21372 Foot drop, left foot: Secondary | ICD-10-CM | POA: Diagnosis not present

## 2020-07-12 DIAGNOSIS — M069 Rheumatoid arthritis, unspecified: Secondary | ICD-10-CM | POA: Diagnosis not present

## 2020-07-12 DIAGNOSIS — M800AXG Age-related osteoporosis with current pathological fracture, other site, subsequent encounter for fracture with delayed healing: Secondary | ICD-10-CM | POA: Diagnosis not present

## 2020-07-13 ENCOUNTER — Telehealth: Payer: Self-pay | Admitting: Physician Assistant

## 2020-07-13 DIAGNOSIS — M21372 Foot drop, left foot: Secondary | ICD-10-CM | POA: Diagnosis not present

## 2020-07-13 DIAGNOSIS — M130 Polyarthritis, unspecified: Secondary | ICD-10-CM | POA: Diagnosis not present

## 2020-07-13 DIAGNOSIS — M069 Rheumatoid arthritis, unspecified: Secondary | ICD-10-CM | POA: Diagnosis not present

## 2020-07-13 DIAGNOSIS — G35 Multiple sclerosis: Secondary | ICD-10-CM | POA: Diagnosis not present

## 2020-07-13 DIAGNOSIS — M800AXG Age-related osteoporosis with current pathological fracture, other site, subsequent encounter for fracture with delayed healing: Secondary | ICD-10-CM | POA: Diagnosis not present

## 2020-07-13 DIAGNOSIS — M80051D Age-related osteoporosis with current pathological fracture, right femur, subsequent encounter for fracture with routine healing: Secondary | ICD-10-CM | POA: Diagnosis not present

## 2020-07-13 NOTE — Telephone Encounter (Signed)
Patient called advised after her therapy session this morning she is in a lot of pain. Patient said she have not had that kind of pain in a long time. Patient said after she went to [facility] she can not stand or walk. Patient said today she was trying to practice standing today at home and she is hurting a lot. Patient said one of her legs is shorter than the other. Patient asked if she should make a doctor's appointment to be seen. The number to contact patient is [phone]

## 2020-07-14 NOTE — Telephone Encounter (Signed)
IC patient. She does not want to make a sooner appointment. She has multiple questions for Dr Sharol Given She complains of increasing pain. She feels one of her legs is shorter than the other. She states "my bone was pushed up in there" and wants to know how this plans to be fixed. I advised Dr Sharol Given out of the office until Tuesday and he could discuss with her then. She said to hold off for now and see how she feels. She said that she will call back on Tuesday if she feels like it is necessary

## 2020-07-17 DIAGNOSIS — M21372 Foot drop, left foot: Secondary | ICD-10-CM | POA: Diagnosis not present

## 2020-07-17 DIAGNOSIS — G35 Multiple sclerosis: Secondary | ICD-10-CM | POA: Diagnosis not present

## 2020-07-17 DIAGNOSIS — M80051D Age-related osteoporosis with current pathological fracture, right femur, subsequent encounter for fracture with routine healing: Secondary | ICD-10-CM | POA: Diagnosis not present

## 2020-07-17 DIAGNOSIS — M800AXG Age-related osteoporosis with current pathological fracture, other site, subsequent encounter for fracture with delayed healing: Secondary | ICD-10-CM | POA: Diagnosis not present

## 2020-07-17 DIAGNOSIS — M069 Rheumatoid arthritis, unspecified: Secondary | ICD-10-CM | POA: Diagnosis not present

## 2020-07-17 DIAGNOSIS — M130 Polyarthritis, unspecified: Secondary | ICD-10-CM | POA: Diagnosis not present

## 2020-07-19 DIAGNOSIS — M21372 Foot drop, left foot: Secondary | ICD-10-CM | POA: Diagnosis not present

## 2020-07-19 DIAGNOSIS — M800AXG Age-related osteoporosis with current pathological fracture, other site, subsequent encounter for fracture with delayed healing: Secondary | ICD-10-CM | POA: Diagnosis not present

## 2020-07-19 DIAGNOSIS — M130 Polyarthritis, unspecified: Secondary | ICD-10-CM | POA: Diagnosis not present

## 2020-07-19 DIAGNOSIS — M069 Rheumatoid arthritis, unspecified: Secondary | ICD-10-CM | POA: Diagnosis not present

## 2020-07-19 DIAGNOSIS — M80051D Age-related osteoporosis with current pathological fracture, right femur, subsequent encounter for fracture with routine healing: Secondary | ICD-10-CM | POA: Diagnosis not present

## 2020-07-19 DIAGNOSIS — G35 Multiple sclerosis: Secondary | ICD-10-CM | POA: Diagnosis not present

## 2020-07-24 DIAGNOSIS — G35 Multiple sclerosis: Secondary | ICD-10-CM | POA: Diagnosis not present

## 2020-07-24 DIAGNOSIS — M069 Rheumatoid arthritis, unspecified: Secondary | ICD-10-CM | POA: Diagnosis not present

## 2020-07-24 DIAGNOSIS — M21372 Foot drop, left foot: Secondary | ICD-10-CM | POA: Diagnosis not present

## 2020-07-24 DIAGNOSIS — M80051D Age-related osteoporosis with current pathological fracture, right femur, subsequent encounter for fracture with routine healing: Secondary | ICD-10-CM | POA: Diagnosis not present

## 2020-07-24 DIAGNOSIS — M800AXG Age-related osteoporosis with current pathological fracture, other site, subsequent encounter for fracture with delayed healing: Secondary | ICD-10-CM | POA: Diagnosis not present

## 2020-07-24 DIAGNOSIS — M130 Polyarthritis, unspecified: Secondary | ICD-10-CM | POA: Diagnosis not present

## 2020-07-25 DIAGNOSIS — M21372 Foot drop, left foot: Secondary | ICD-10-CM | POA: Diagnosis not present

## 2020-07-25 DIAGNOSIS — M800AXG Age-related osteoporosis with current pathological fracture, other site, subsequent encounter for fracture with delayed healing: Secondary | ICD-10-CM | POA: Diagnosis not present

## 2020-07-25 DIAGNOSIS — M069 Rheumatoid arthritis, unspecified: Secondary | ICD-10-CM | POA: Diagnosis not present

## 2020-07-25 DIAGNOSIS — M130 Polyarthritis, unspecified: Secondary | ICD-10-CM | POA: Diagnosis not present

## 2020-07-25 DIAGNOSIS — G35 Multiple sclerosis: Secondary | ICD-10-CM | POA: Diagnosis not present

## 2020-07-25 DIAGNOSIS — M80051D Age-related osteoporosis with current pathological fracture, right femur, subsequent encounter for fracture with routine healing: Secondary | ICD-10-CM | POA: Diagnosis not present

## 2020-07-26 DIAGNOSIS — M130 Polyarthritis, unspecified: Secondary | ICD-10-CM | POA: Diagnosis not present

## 2020-07-26 DIAGNOSIS — G35 Multiple sclerosis: Secondary | ICD-10-CM | POA: Diagnosis not present

## 2020-07-26 DIAGNOSIS — M21372 Foot drop, left foot: Secondary | ICD-10-CM | POA: Diagnosis not present

## 2020-07-26 DIAGNOSIS — M80051D Age-related osteoporosis with current pathological fracture, right femur, subsequent encounter for fracture with routine healing: Secondary | ICD-10-CM | POA: Diagnosis not present

## 2020-07-26 DIAGNOSIS — M800AXG Age-related osteoporosis with current pathological fracture, other site, subsequent encounter for fracture with delayed healing: Secondary | ICD-10-CM | POA: Diagnosis not present

## 2020-07-26 DIAGNOSIS — M069 Rheumatoid arthritis, unspecified: Secondary | ICD-10-CM | POA: Diagnosis not present

## 2020-07-27 ENCOUNTER — Encounter: Payer: Self-pay | Admitting: Orthopedic Surgery

## 2020-07-27 ENCOUNTER — Ambulatory Visit (INDEPENDENT_AMBULATORY_CARE_PROVIDER_SITE_OTHER): Payer: Medicare Other | Admitting: Orthopedic Surgery

## 2020-07-27 ENCOUNTER — Ambulatory Visit (INDEPENDENT_AMBULATORY_CARE_PROVIDER_SITE_OTHER): Payer: Medicare Other

## 2020-07-27 VITALS — Ht 65.0 in | Wt 121.0 lb

## 2020-07-27 DIAGNOSIS — S32421G Displaced fracture of posterior wall of right acetabulum, subsequent encounter for fracture with delayed healing: Secondary | ICD-10-CM

## 2020-07-27 DIAGNOSIS — M25551 Pain in right hip: Secondary | ICD-10-CM | POA: Diagnosis not present

## 2020-07-27 NOTE — Progress Notes (Signed)
Office Visit Note   Patient: [name]           Date of Birth: 06/14/1932           MRN: [id number] Visit Date: 07/27/2020              Requested by: [name], MD [address] [place],  [place] [address] PCP: [name], MD  Chief Complaint  Patient presents with  . Right Hip - Follow-up    Closed right acetabular fx Humeral head fx due to multiple falls.      HPI: Patient is seen in follow-up for impacted acetabular fracture with protrusio.  Patient has had multiple falls she currently has a aide at home providing 24 hours a day 7 days a week assistance she is working with physical therapy states she still has difficulty and pain with putting weight on her right hip states that her right leg is shorter she states her pain in the right hip is in the groin area.  She is currently in a wheelchair.  Assessment & Plan: Visit Diagnoses:  1. Pain in right hip   2. Closed displaced fracture of posterior wall of right acetabulum with delayed healing, subsequent encounter     Plan: Recommended continue with her physical therapy weightbearing as tolerated the acetabular fracture is healed well she will have pain from the collapse of the femoral head.  Patient is not a good surgical candidate at this time recommend that she resume using her bone stimulating medication  Follow-Up Instructions: Return if symptoms worsen or fail to improve.   Ortho Exam  Patient is alert, oriented, no adenopathy, well-dressed, normal affect, normal respiratory effort. Examination patient has decreased internal and external rotation of the right hip this is painful.  She has been on Robaxin recommend that she discontinue this.  Discussed that she continue with her Voltaren gel.  Imaging: XR HIP UNILAT W OR W/O PELVIS 2-3 VIEWS RIGHT  Result Date: 07/27/2020 . Had negative pressure 2 view radiographs of the right hip shows good consolidation of the acetabular fracture  there is advanced degenerative collapse of the femoral head.  No images are attached to the encounter.  Labs: Lab Results  Component Value Date   ESRSEDRATE 108 (H) 04/03/2010   REPTSTATUS 03/24/2020 FINAL 03/23/2020   CULT (A) 03/23/2020    <10,000 COLONIES/mL INSIGNIFICANT GROWTH Performed at [facility] [address], [place], [place] [address]      Lab Results  Component Value Date   ALBUMIN 4.0 05/30/2020   ALBUMIN 4.0 05/03/2020   ALBUMIN 3.7 03/23/2020    No results found for: MG No results found for: VD25OH  No results found for: PREALBUMIN CBC EXTENDED Latest Ref Rng & Units 05/30/2020 05/12/2020 05/11/2020  WBC - 4.5 4.2 4.1  RBC 3.87 - 5.11 3.87 3.95 3.78(A)  HGB 12.0 - 16.0 13.0 13.3 12.6  HCT 36 - 46 39 38 36  PLT 150 - 399 - 230 221  NEUTROABS 1.7 - 7.7 K/uL - - -  LYMPHSABS 0.7 - 4.0 K/uL - - -     Body mass index is 20.14 kg/m.  Orders:  Orders Placed This Encounter  Procedures  . XR HIP UNILAT W OR W/O PELVIS 2-3 VIEWS RIGHT   No orders of the defined types were placed in this encounter.    Procedures: No procedures performed  Clinical Data: No additional findings.  ROS:  All other systems negative,  except as noted in the HPI. Review of Systems  Objective: Vital Signs: Ht 5\' 5"  (1.651 m)   Wt 121 lb (54.9 kg)   LMP 09/08/1970 (Approximate)   BMI 20.14 kg/m   Specialty Comments:  No specialty comments available.  PMFS History: Patient Active Problem List   Diagnosis Date Noted  . Hyponatremia 04/09/2020  . Acetabular fracture ([place]) 04/01/2020  . Closed right ischial fracture ([place]) 04/01/2020  . Hypertension 04/01/2020  . Hypothyroidism 04/01/2020  . Frequent falls 03/23/2020  . RA (rheumatoid arthritis) ([place]) 03/23/2020  . Tympanic membrane central perforation 12/15/2019  . Cholesteatoma of external auditory canal, right 09/10/2019  . Hearing loss 09/10/2019  . Chronic venous insufficiency of lower extremity  01/25/2019  . Grade II diastolic dysfunction 44/96/7591  . Urinary dysfunction 12/22/2017  . Multiple sclerosis, primary progressive ([place]) 06/19/2017  . Left foot drop 03/17/2017  . Primary chronic progressive multiple sclerosis ([place]) 03/17/2017  . Myelopathy ([place]) 03/17/2017  . Bilateral lower extremity edema 03/17/2017   Past Medical History:  Diagnosis Date  . Chronic venous insufficiency of lower extremity 01/25/2019   With chronic bilateral lower extremity edema  . Incontinent of urine   . Multiple sclerosis ([place]) 2015  . Osteoporosis    osteopenia  . Polyarthritis   . Post-menopausal   . Rheumatoid arthritis ([place])   . Temporal arteritis ([place]) 2011    Family History  Problem Relation Age of Onset  . Osteoarthritis Father   . Stroke Father   . Heart failure Mother   . Hypertension Mother   . Heart disease Mother   . Multiple sclerosis Cousin   . Multiple sclerosis Cousin   . Multiple sclerosis Cousin     Past Surgical History:  Procedure Laterality Date  . ABDOMINAL HYSTERECTOMY  1971   TVH  . breast ductectomy Right 05/1995  . COLONOSCOPY  10/2011   Social History   Occupational History  . Not on file  Tobacco Use  . Smoking status: Never Smoker  . Smokeless tobacco: Never Used  Substance and Sexual Activity  . Alcohol use: No  . Drug use: No  . Sexual activity: Not Currently    Partners: Male    Birth control/protection: Post-menopausal, Surgical    Comment: hysterectomy

## 2020-07-31 DIAGNOSIS — M21372 Foot drop, left foot: Secondary | ICD-10-CM | POA: Diagnosis not present

## 2020-07-31 DIAGNOSIS — M800AXG Age-related osteoporosis with current pathological fracture, other site, subsequent encounter for fracture with delayed healing: Secondary | ICD-10-CM | POA: Diagnosis not present

## 2020-07-31 DIAGNOSIS — M069 Rheumatoid arthritis, unspecified: Secondary | ICD-10-CM | POA: Diagnosis not present

## 2020-07-31 DIAGNOSIS — G35 Multiple sclerosis: Secondary | ICD-10-CM | POA: Diagnosis not present

## 2020-07-31 DIAGNOSIS — M130 Polyarthritis, unspecified: Secondary | ICD-10-CM | POA: Diagnosis not present

## 2020-07-31 DIAGNOSIS — M80051D Age-related osteoporosis with current pathological fracture, right femur, subsequent encounter for fracture with routine healing: Secondary | ICD-10-CM | POA: Diagnosis not present

## 2020-08-02 DIAGNOSIS — G35 Multiple sclerosis: Secondary | ICD-10-CM | POA: Diagnosis not present

## 2020-08-02 DIAGNOSIS — M800AXG Age-related osteoporosis with current pathological fracture, other site, subsequent encounter for fracture with delayed healing: Secondary | ICD-10-CM | POA: Diagnosis not present

## 2020-08-02 DIAGNOSIS — M130 Polyarthritis, unspecified: Secondary | ICD-10-CM | POA: Diagnosis not present

## 2020-08-02 DIAGNOSIS — M069 Rheumatoid arthritis, unspecified: Secondary | ICD-10-CM | POA: Diagnosis not present

## 2020-08-02 DIAGNOSIS — M21372 Foot drop, left foot: Secondary | ICD-10-CM | POA: Diagnosis not present

## 2020-08-02 DIAGNOSIS — M80051D Age-related osteoporosis with current pathological fracture, right femur, subsequent encounter for fracture with routine healing: Secondary | ICD-10-CM | POA: Diagnosis not present

## 2020-08-04 DIAGNOSIS — M130 Polyarthritis, unspecified: Secondary | ICD-10-CM | POA: Diagnosis not present

## 2020-08-04 DIAGNOSIS — M800AXG Age-related osteoporosis with current pathological fracture, other site, subsequent encounter for fracture with delayed healing: Secondary | ICD-10-CM | POA: Diagnosis not present

## 2020-08-04 DIAGNOSIS — M21372 Foot drop, left foot: Secondary | ICD-10-CM | POA: Diagnosis not present

## 2020-08-04 DIAGNOSIS — M069 Rheumatoid arthritis, unspecified: Secondary | ICD-10-CM | POA: Diagnosis not present

## 2020-08-04 DIAGNOSIS — M80051D Age-related osteoporosis with current pathological fracture, right femur, subsequent encounter for fracture with routine healing: Secondary | ICD-10-CM | POA: Diagnosis not present

## 2020-08-04 DIAGNOSIS — G35 Multiple sclerosis: Secondary | ICD-10-CM | POA: Diagnosis not present

## 2020-08-07 DIAGNOSIS — M316 Other giant cell arteritis: Secondary | ICD-10-CM | POA: Diagnosis not present

## 2020-08-07 DIAGNOSIS — Z9181 History of falling: Secondary | ICD-10-CM | POA: Diagnosis not present

## 2020-08-07 DIAGNOSIS — H9190 Unspecified hearing loss, unspecified ear: Secondary | ICD-10-CM | POA: Diagnosis not present

## 2020-08-07 DIAGNOSIS — M130 Polyarthritis, unspecified: Secondary | ICD-10-CM | POA: Diagnosis not present

## 2020-08-07 DIAGNOSIS — M8580 Other specified disorders of bone density and structure, unspecified site: Secondary | ICD-10-CM | POA: Diagnosis not present

## 2020-08-07 DIAGNOSIS — I119 Hypertensive heart disease without heart failure: Secondary | ICD-10-CM | POA: Diagnosis not present

## 2020-08-07 DIAGNOSIS — R05 Cough: Secondary | ICD-10-CM | POA: Diagnosis not present

## 2020-08-07 DIAGNOSIS — G35 Multiple sclerosis: Secondary | ICD-10-CM | POA: Diagnosis not present

## 2020-08-07 DIAGNOSIS — E871 Hypo-osmolality and hyponatremia: Secondary | ICD-10-CM | POA: Diagnosis not present

## 2020-08-07 DIAGNOSIS — E034 Atrophy of thyroid (acquired): Secondary | ICD-10-CM | POA: Diagnosis not present

## 2020-08-07 DIAGNOSIS — M21372 Foot drop, left foot: Secondary | ICD-10-CM | POA: Diagnosis not present

## 2020-08-07 DIAGNOSIS — Z8781 Personal history of (healed) traumatic fracture: Secondary | ICD-10-CM | POA: Diagnosis not present

## 2020-08-07 DIAGNOSIS — I872 Venous insufficiency (chronic) (peripheral): Secondary | ICD-10-CM | POA: Diagnosis not present

## 2020-08-07 DIAGNOSIS — R32 Unspecified urinary incontinence: Secondary | ICD-10-CM | POA: Diagnosis not present

## 2020-08-07 DIAGNOSIS — M80051D Age-related osteoporosis with current pathological fracture, right femur, subsequent encounter for fracture with routine healing: Secondary | ICD-10-CM | POA: Diagnosis not present

## 2020-08-07 DIAGNOSIS — M069 Rheumatoid arthritis, unspecified: Secondary | ICD-10-CM | POA: Diagnosis not present

## 2020-08-07 DIAGNOSIS — G959 Disease of spinal cord, unspecified: Secondary | ICD-10-CM | POA: Diagnosis not present

## 2020-08-07 DIAGNOSIS — Z993 Dependence on wheelchair: Secondary | ICD-10-CM | POA: Diagnosis not present

## 2020-08-07 DIAGNOSIS — I2510 Atherosclerotic heart disease of native coronary artery without angina pectoris: Secondary | ICD-10-CM | POA: Diagnosis not present

## 2020-08-07 DIAGNOSIS — R600 Localized edema: Secondary | ICD-10-CM | POA: Diagnosis not present

## 2020-08-07 DIAGNOSIS — Z90710 Acquired absence of both cervix and uterus: Secondary | ICD-10-CM | POA: Diagnosis not present

## 2020-08-07 DIAGNOSIS — W19XXXD Unspecified fall, subsequent encounter: Secondary | ICD-10-CM | POA: Diagnosis not present

## 2020-08-07 DIAGNOSIS — R296 Repeated falls: Secondary | ICD-10-CM | POA: Diagnosis not present

## 2020-08-07 DIAGNOSIS — M800AXG Age-related osteoporosis with current pathological fracture, other site, subsequent encounter for fracture with delayed healing: Secondary | ICD-10-CM | POA: Diagnosis not present

## 2020-08-08 DIAGNOSIS — H6123 Impacted cerumen, bilateral: Secondary | ICD-10-CM | POA: Diagnosis not present

## 2020-08-09 DIAGNOSIS — G35 Multiple sclerosis: Secondary | ICD-10-CM | POA: Diagnosis not present

## 2020-08-09 DIAGNOSIS — M800AXG Age-related osteoporosis with current pathological fracture, other site, subsequent encounter for fracture with delayed healing: Secondary | ICD-10-CM | POA: Diagnosis not present

## 2020-08-09 DIAGNOSIS — M21372 Foot drop, left foot: Secondary | ICD-10-CM | POA: Diagnosis not present

## 2020-08-09 DIAGNOSIS — M069 Rheumatoid arthritis, unspecified: Secondary | ICD-10-CM | POA: Diagnosis not present

## 2020-08-09 DIAGNOSIS — M80051D Age-related osteoporosis with current pathological fracture, right femur, subsequent encounter for fracture with routine healing: Secondary | ICD-10-CM | POA: Diagnosis not present

## 2020-08-09 DIAGNOSIS — M130 Polyarthritis, unspecified: Secondary | ICD-10-CM | POA: Diagnosis not present

## 2020-08-10 DIAGNOSIS — M069 Rheumatoid arthritis, unspecified: Secondary | ICD-10-CM | POA: Diagnosis not present

## 2020-08-10 DIAGNOSIS — I509 Heart failure, unspecified: Secondary | ICD-10-CM | POA: Diagnosis not present

## 2020-08-10 DIAGNOSIS — M79643 Pain in unspecified hand: Secondary | ICD-10-CM | POA: Diagnosis not present

## 2020-08-10 DIAGNOSIS — G35 Multiple sclerosis: Secondary | ICD-10-CM | POA: Diagnosis not present

## 2020-08-10 DIAGNOSIS — M130 Polyarthritis, unspecified: Secondary | ICD-10-CM | POA: Diagnosis not present

## 2020-08-10 DIAGNOSIS — M800AXG Age-related osteoporosis with current pathological fracture, other site, subsequent encounter for fracture with delayed healing: Secondary | ICD-10-CM | POA: Diagnosis not present

## 2020-08-10 DIAGNOSIS — M810 Age-related osteoporosis without current pathological fracture: Secondary | ICD-10-CM | POA: Diagnosis not present

## 2020-08-10 DIAGNOSIS — Z79899 Other long term (current) drug therapy: Secondary | ICD-10-CM | POA: Diagnosis not present

## 2020-08-10 DIAGNOSIS — L659 Nonscarring hair loss, unspecified: Secondary | ICD-10-CM | POA: Diagnosis not present

## 2020-08-10 DIAGNOSIS — M7989 Other specified soft tissue disorders: Secondary | ICD-10-CM | POA: Diagnosis not present

## 2020-08-10 DIAGNOSIS — M0579 Rheumatoid arthritis with rheumatoid factor of multiple sites without organ or systems involvement: Secondary | ICD-10-CM | POA: Diagnosis not present

## 2020-08-10 DIAGNOSIS — I878 Other specified disorders of veins: Secondary | ICD-10-CM | POA: Diagnosis not present

## 2020-08-10 DIAGNOSIS — M21372 Foot drop, left foot: Secondary | ICD-10-CM | POA: Diagnosis not present

## 2020-08-10 DIAGNOSIS — M80051D Age-related osteoporosis with current pathological fracture, right femur, subsequent encounter for fracture with routine healing: Secondary | ICD-10-CM | POA: Diagnosis not present

## 2020-08-10 DIAGNOSIS — D472 Monoclonal gammopathy: Secondary | ICD-10-CM | POA: Diagnosis not present

## 2020-08-15 DIAGNOSIS — M80051D Age-related osteoporosis with current pathological fracture, right femur, subsequent encounter for fracture with routine healing: Secondary | ICD-10-CM | POA: Diagnosis not present

## 2020-08-15 DIAGNOSIS — M21372 Foot drop, left foot: Secondary | ICD-10-CM | POA: Diagnosis not present

## 2020-08-15 DIAGNOSIS — M130 Polyarthritis, unspecified: Secondary | ICD-10-CM | POA: Diagnosis not present

## 2020-08-15 DIAGNOSIS — G35 Multiple sclerosis: Secondary | ICD-10-CM | POA: Diagnosis not present

## 2020-08-15 DIAGNOSIS — M800AXG Age-related osteoporosis with current pathological fracture, other site, subsequent encounter for fracture with delayed healing: Secondary | ICD-10-CM | POA: Diagnosis not present

## 2020-08-15 DIAGNOSIS — M810 Age-related osteoporosis without current pathological fracture: Secondary | ICD-10-CM | POA: Diagnosis not present

## 2020-08-15 DIAGNOSIS — M069 Rheumatoid arthritis, unspecified: Secondary | ICD-10-CM | POA: Diagnosis not present

## 2020-08-16 DIAGNOSIS — M800AXG Age-related osteoporosis with current pathological fracture, other site, subsequent encounter for fracture with delayed healing: Secondary | ICD-10-CM | POA: Diagnosis not present

## 2020-08-16 DIAGNOSIS — M069 Rheumatoid arthritis, unspecified: Secondary | ICD-10-CM | POA: Diagnosis not present

## 2020-08-16 DIAGNOSIS — M21372 Foot drop, left foot: Secondary | ICD-10-CM | POA: Diagnosis not present

## 2020-08-16 DIAGNOSIS — G35 Multiple sclerosis: Secondary | ICD-10-CM | POA: Diagnosis not present

## 2020-08-16 DIAGNOSIS — M130 Polyarthritis, unspecified: Secondary | ICD-10-CM | POA: Diagnosis not present

## 2020-08-16 DIAGNOSIS — M80051D Age-related osteoporosis with current pathological fracture, right femur, subsequent encounter for fracture with routine healing: Secondary | ICD-10-CM | POA: Diagnosis not present

## 2020-08-18 DIAGNOSIS — G35 Multiple sclerosis: Secondary | ICD-10-CM | POA: Diagnosis not present

## 2020-08-18 DIAGNOSIS — M21372 Foot drop, left foot: Secondary | ICD-10-CM | POA: Diagnosis not present

## 2020-08-18 DIAGNOSIS — M130 Polyarthritis, unspecified: Secondary | ICD-10-CM | POA: Diagnosis not present

## 2020-08-18 DIAGNOSIS — M069 Rheumatoid arthritis, unspecified: Secondary | ICD-10-CM | POA: Diagnosis not present

## 2020-08-18 DIAGNOSIS — M80051D Age-related osteoporosis with current pathological fracture, right femur, subsequent encounter for fracture with routine healing: Secondary | ICD-10-CM | POA: Diagnosis not present

## 2020-08-18 DIAGNOSIS — M800AXG Age-related osteoporosis with current pathological fracture, other site, subsequent encounter for fracture with delayed healing: Secondary | ICD-10-CM | POA: Diagnosis not present

## 2020-08-21 DIAGNOSIS — G35 Multiple sclerosis: Secondary | ICD-10-CM | POA: Diagnosis not present

## 2020-08-21 DIAGNOSIS — M069 Rheumatoid arthritis, unspecified: Secondary | ICD-10-CM | POA: Diagnosis not present

## 2020-08-21 DIAGNOSIS — M130 Polyarthritis, unspecified: Secondary | ICD-10-CM | POA: Diagnosis not present

## 2020-08-21 DIAGNOSIS — M80051D Age-related osteoporosis with current pathological fracture, right femur, subsequent encounter for fracture with routine healing: Secondary | ICD-10-CM | POA: Diagnosis not present

## 2020-08-21 DIAGNOSIS — M21372 Foot drop, left foot: Secondary | ICD-10-CM | POA: Diagnosis not present

## 2020-08-21 DIAGNOSIS — M800AXG Age-related osteoporosis with current pathological fracture, other site, subsequent encounter for fracture with delayed healing: Secondary | ICD-10-CM | POA: Diagnosis not present

## 2020-08-23 DIAGNOSIS — M21372 Foot drop, left foot: Secondary | ICD-10-CM | POA: Diagnosis not present

## 2020-08-23 DIAGNOSIS — M80051D Age-related osteoporosis with current pathological fracture, right femur, subsequent encounter for fracture with routine healing: Secondary | ICD-10-CM | POA: Diagnosis not present

## 2020-08-23 DIAGNOSIS — M069 Rheumatoid arthritis, unspecified: Secondary | ICD-10-CM | POA: Diagnosis not present

## 2020-08-23 DIAGNOSIS — M800AXG Age-related osteoporosis with current pathological fracture, other site, subsequent encounter for fracture with delayed healing: Secondary | ICD-10-CM | POA: Diagnosis not present

## 2020-08-23 DIAGNOSIS — M130 Polyarthritis, unspecified: Secondary | ICD-10-CM | POA: Diagnosis not present

## 2020-08-23 DIAGNOSIS — G35 Multiple sclerosis: Secondary | ICD-10-CM | POA: Diagnosis not present

## 2020-08-25 DIAGNOSIS — M80051D Age-related osteoporosis with current pathological fracture, right femur, subsequent encounter for fracture with routine healing: Secondary | ICD-10-CM | POA: Diagnosis not present

## 2020-08-25 DIAGNOSIS — M800AXG Age-related osteoporosis with current pathological fracture, other site, subsequent encounter for fracture with delayed healing: Secondary | ICD-10-CM | POA: Diagnosis not present

## 2020-08-25 DIAGNOSIS — G35 Multiple sclerosis: Secondary | ICD-10-CM | POA: Diagnosis not present

## 2020-08-25 DIAGNOSIS — M130 Polyarthritis, unspecified: Secondary | ICD-10-CM | POA: Diagnosis not present

## 2020-08-25 DIAGNOSIS — M21372 Foot drop, left foot: Secondary | ICD-10-CM | POA: Diagnosis not present

## 2020-08-25 DIAGNOSIS — M069 Rheumatoid arthritis, unspecified: Secondary | ICD-10-CM | POA: Diagnosis not present

## 2020-08-28 DIAGNOSIS — Z961 Presence of intraocular lens: Secondary | ICD-10-CM | POA: Diagnosis not present

## 2020-08-28 DIAGNOSIS — H353132 Nonexudative age-related macular degeneration, bilateral, intermediate dry stage: Secondary | ICD-10-CM | POA: Diagnosis not present

## 2020-08-30 DIAGNOSIS — M21372 Foot drop, left foot: Secondary | ICD-10-CM | POA: Diagnosis not present

## 2020-08-30 DIAGNOSIS — G35 Multiple sclerosis: Secondary | ICD-10-CM | POA: Diagnosis not present

## 2020-08-30 DIAGNOSIS — M800AXG Age-related osteoporosis with current pathological fracture, other site, subsequent encounter for fracture with delayed healing: Secondary | ICD-10-CM | POA: Diagnosis not present

## 2020-08-30 DIAGNOSIS — M80051D Age-related osteoporosis with current pathological fracture, right femur, subsequent encounter for fracture with routine healing: Secondary | ICD-10-CM | POA: Diagnosis not present

## 2020-08-30 DIAGNOSIS — M130 Polyarthritis, unspecified: Secondary | ICD-10-CM | POA: Diagnosis not present

## 2020-08-30 DIAGNOSIS — M069 Rheumatoid arthritis, unspecified: Secondary | ICD-10-CM | POA: Diagnosis not present

## 2020-09-01 ENCOUNTER — Ambulatory Visit: Payer: Medicare Other | Admitting: Podiatry

## 2020-09-01 DIAGNOSIS — M800AXG Age-related osteoporosis with current pathological fracture, other site, subsequent encounter for fracture with delayed healing: Secondary | ICD-10-CM | POA: Diagnosis not present

## 2020-09-01 DIAGNOSIS — M80051D Age-related osteoporosis with current pathological fracture, right femur, subsequent encounter for fracture with routine healing: Secondary | ICD-10-CM | POA: Diagnosis not present

## 2020-09-01 DIAGNOSIS — G35 Multiple sclerosis: Secondary | ICD-10-CM | POA: Diagnosis not present

## 2020-09-01 DIAGNOSIS — M069 Rheumatoid arthritis, unspecified: Secondary | ICD-10-CM | POA: Diagnosis not present

## 2020-09-01 DIAGNOSIS — M130 Polyarthritis, unspecified: Secondary | ICD-10-CM | POA: Diagnosis not present

## 2020-09-01 DIAGNOSIS — M21372 Foot drop, left foot: Secondary | ICD-10-CM | POA: Diagnosis not present

## 2020-09-05 DIAGNOSIS — H903 Sensorineural hearing loss, bilateral: Secondary | ICD-10-CM | POA: Diagnosis not present

## 2020-09-06 DIAGNOSIS — M8580 Other specified disorders of bone density and structure, unspecified site: Secondary | ICD-10-CM | POA: Diagnosis not present

## 2020-09-06 DIAGNOSIS — G35 Multiple sclerosis: Secondary | ICD-10-CM | POA: Diagnosis not present

## 2020-09-06 DIAGNOSIS — R05 Cough: Secondary | ICD-10-CM | POA: Diagnosis not present

## 2020-09-06 DIAGNOSIS — W19XXXD Unspecified fall, subsequent encounter: Secondary | ICD-10-CM | POA: Diagnosis not present

## 2020-09-06 DIAGNOSIS — Z8781 Personal history of (healed) traumatic fracture: Secondary | ICD-10-CM | POA: Diagnosis not present

## 2020-09-06 DIAGNOSIS — H9190 Unspecified hearing loss, unspecified ear: Secondary | ICD-10-CM | POA: Diagnosis not present

## 2020-09-06 DIAGNOSIS — I119 Hypertensive heart disease without heart failure: Secondary | ICD-10-CM | POA: Diagnosis not present

## 2020-09-06 DIAGNOSIS — Z90710 Acquired absence of both cervix and uterus: Secondary | ICD-10-CM | POA: Diagnosis not present

## 2020-09-06 DIAGNOSIS — R296 Repeated falls: Secondary | ICD-10-CM | POA: Diagnosis not present

## 2020-09-06 DIAGNOSIS — E034 Atrophy of thyroid (acquired): Secondary | ICD-10-CM | POA: Diagnosis not present

## 2020-09-06 DIAGNOSIS — Z9181 History of falling: Secondary | ICD-10-CM | POA: Diagnosis not present

## 2020-09-06 DIAGNOSIS — I872 Venous insufficiency (chronic) (peripheral): Secondary | ICD-10-CM | POA: Diagnosis not present

## 2020-09-06 DIAGNOSIS — M316 Other giant cell arteritis: Secondary | ICD-10-CM | POA: Diagnosis not present

## 2020-09-06 DIAGNOSIS — M80051D Age-related osteoporosis with current pathological fracture, right femur, subsequent encounter for fracture with routine healing: Secondary | ICD-10-CM | POA: Diagnosis not present

## 2020-09-06 DIAGNOSIS — M130 Polyarthritis, unspecified: Secondary | ICD-10-CM | POA: Diagnosis not present

## 2020-09-06 DIAGNOSIS — Z993 Dependence on wheelchair: Secondary | ICD-10-CM | POA: Diagnosis not present

## 2020-09-06 DIAGNOSIS — M069 Rheumatoid arthritis, unspecified: Secondary | ICD-10-CM | POA: Diagnosis not present

## 2020-09-06 DIAGNOSIS — E871 Hypo-osmolality and hyponatremia: Secondary | ICD-10-CM | POA: Diagnosis not present

## 2020-09-06 DIAGNOSIS — I2510 Atherosclerotic heart disease of native coronary artery without angina pectoris: Secondary | ICD-10-CM | POA: Diagnosis not present

## 2020-09-06 DIAGNOSIS — M800AXG Age-related osteoporosis with current pathological fracture, other site, subsequent encounter for fracture with delayed healing: Secondary | ICD-10-CM | POA: Diagnosis not present

## 2020-09-06 DIAGNOSIS — R600 Localized edema: Secondary | ICD-10-CM | POA: Diagnosis not present

## 2020-09-06 DIAGNOSIS — R32 Unspecified urinary incontinence: Secondary | ICD-10-CM | POA: Diagnosis not present

## 2020-09-06 DIAGNOSIS — G959 Disease of spinal cord, unspecified: Secondary | ICD-10-CM | POA: Diagnosis not present

## 2020-09-06 DIAGNOSIS — M21372 Foot drop, left foot: Secondary | ICD-10-CM | POA: Diagnosis not present

## 2020-09-07 DIAGNOSIS — Z23 Encounter for immunization: Secondary | ICD-10-CM | POA: Diagnosis not present

## 2020-09-07 DIAGNOSIS — G35 Multiple sclerosis: Secondary | ICD-10-CM | POA: Diagnosis not present

## 2020-09-07 DIAGNOSIS — M069 Rheumatoid arthritis, unspecified: Secondary | ICD-10-CM | POA: Diagnosis not present

## 2020-09-07 DIAGNOSIS — Z0000 Encounter for general adult medical examination without abnormal findings: Secondary | ICD-10-CM | POA: Diagnosis not present

## 2020-09-07 DIAGNOSIS — Z1389 Encounter for screening for other disorder: Secondary | ICD-10-CM | POA: Diagnosis not present

## 2020-09-07 DIAGNOSIS — M800AXG Age-related osteoporosis with current pathological fracture, other site, subsequent encounter for fracture with delayed healing: Secondary | ICD-10-CM | POA: Diagnosis not present

## 2020-09-07 DIAGNOSIS — E871 Hypo-osmolality and hyponatremia: Secondary | ICD-10-CM | POA: Diagnosis not present

## 2020-09-07 DIAGNOSIS — M1990 Unspecified osteoarthritis, unspecified site: Secondary | ICD-10-CM | POA: Diagnosis not present

## 2020-09-07 DIAGNOSIS — E559 Vitamin D deficiency, unspecified: Secondary | ICD-10-CM | POA: Diagnosis not present

## 2020-09-07 DIAGNOSIS — M21372 Foot drop, left foot: Secondary | ICD-10-CM | POA: Diagnosis not present

## 2020-09-07 DIAGNOSIS — R54 Age-related physical debility: Secondary | ICD-10-CM | POA: Diagnosis not present

## 2020-09-07 DIAGNOSIS — M80051D Age-related osteoporosis with current pathological fracture, right femur, subsequent encounter for fracture with routine healing: Secondary | ICD-10-CM | POA: Diagnosis not present

## 2020-09-07 DIAGNOSIS — R609 Edema, unspecified: Secondary | ICD-10-CM | POA: Diagnosis not present

## 2020-09-07 DIAGNOSIS — E039 Hypothyroidism, unspecified: Secondary | ICD-10-CM | POA: Diagnosis not present

## 2020-09-07 DIAGNOSIS — M130 Polyarthritis, unspecified: Secondary | ICD-10-CM | POA: Diagnosis not present

## 2020-09-07 DIAGNOSIS — S72001D Fracture of unspecified part of neck of right femur, subsequent encounter for closed fracture with routine healing: Secondary | ICD-10-CM | POA: Diagnosis not present

## 2020-09-07 DIAGNOSIS — R262 Difficulty in walking, not elsewhere classified: Secondary | ICD-10-CM | POA: Diagnosis not present

## 2020-09-11 ENCOUNTER — Ambulatory Visit (INDEPENDENT_AMBULATORY_CARE_PROVIDER_SITE_OTHER): Payer: Medicare Other | Admitting: Podiatry

## 2020-09-11 ENCOUNTER — Other Ambulatory Visit: Payer: Self-pay

## 2020-09-11 ENCOUNTER — Telehealth: Payer: Self-pay | Admitting: *Deleted

## 2020-09-11 DIAGNOSIS — M130 Polyarthritis, unspecified: Secondary | ICD-10-CM | POA: Diagnosis not present

## 2020-09-11 DIAGNOSIS — M2042 Other hammer toe(s) (acquired), left foot: Secondary | ICD-10-CM

## 2020-09-11 DIAGNOSIS — M79674 Pain in right toe(s): Secondary | ICD-10-CM | POA: Diagnosis not present

## 2020-09-11 DIAGNOSIS — M80051D Age-related osteoporosis with current pathological fracture, right femur, subsequent encounter for fracture with routine healing: Secondary | ICD-10-CM | POA: Diagnosis not present

## 2020-09-11 DIAGNOSIS — G35 Multiple sclerosis: Secondary | ICD-10-CM | POA: Diagnosis not present

## 2020-09-11 DIAGNOSIS — M79675 Pain in left toe(s): Secondary | ICD-10-CM

## 2020-09-11 DIAGNOSIS — M21372 Foot drop, left foot: Secondary | ICD-10-CM | POA: Diagnosis not present

## 2020-09-11 DIAGNOSIS — M069 Rheumatoid arthritis, unspecified: Secondary | ICD-10-CM | POA: Diagnosis not present

## 2020-09-11 DIAGNOSIS — M0579 Rheumatoid arthritis with rheumatoid factor of multiple sites without organ or systems involvement: Secondary | ICD-10-CM

## 2020-09-11 DIAGNOSIS — M2041 Other hammer toe(s) (acquired), right foot: Secondary | ICD-10-CM

## 2020-09-11 DIAGNOSIS — B351 Tinea unguium: Secondary | ICD-10-CM | POA: Diagnosis not present

## 2020-09-11 DIAGNOSIS — G959 Disease of spinal cord, unspecified: Secondary | ICD-10-CM

## 2020-09-11 DIAGNOSIS — M800AXG Age-related osteoporosis with current pathological fracture, other site, subsequent encounter for fracture with delayed healing: Secondary | ICD-10-CM | POA: Diagnosis not present

## 2020-09-11 NOTE — Telephone Encounter (Signed)
Pt has question about her prescription, pt would not give me the name of her medication. Please call  [phone]

## 2020-09-11 NOTE — Telephone Encounter (Signed)
I called pt and she had a [employer] prescription for a L AFO splint back in 03-17-2017 which she never did use.  She is now asking for it.  I called [facility] [phone] which Dr. Felecia Shelling did the prescription.  Since last seen 12-27-19 we can redo this order for her making sure the date of prescription does make note 12-27-19, are we ok to do this?

## 2020-09-12 NOTE — Telephone Encounter (Signed)
Pt called back wanting to know the update on this prescription. Please advise.

## 2020-09-12 NOTE — Telephone Encounter (Signed)
I am happy to order as long as they will accept note from 12/2019. Otherwise, we can address at follow up. TY.

## 2020-09-13 DIAGNOSIS — M80051D Age-related osteoporosis with current pathological fracture, right femur, subsequent encounter for fracture with routine healing: Secondary | ICD-10-CM | POA: Diagnosis not present

## 2020-09-13 DIAGNOSIS — G35 Multiple sclerosis: Secondary | ICD-10-CM | POA: Diagnosis not present

## 2020-09-13 DIAGNOSIS — M800AXG Age-related osteoporosis with current pathological fracture, other site, subsequent encounter for fracture with delayed healing: Secondary | ICD-10-CM | POA: Diagnosis not present

## 2020-09-13 DIAGNOSIS — M130 Polyarthritis, unspecified: Secondary | ICD-10-CM | POA: Diagnosis not present

## 2020-09-13 DIAGNOSIS — M21372 Foot drop, left foot: Secondary | ICD-10-CM | POA: Diagnosis not present

## 2020-09-13 DIAGNOSIS — M069 Rheumatoid arthritis, unspecified: Secondary | ICD-10-CM | POA: Diagnosis not present

## 2020-09-13 NOTE — Telephone Encounter (Signed)
Pt made aware of the possibility that may need sooner appt, but would like to try and get it.  Once order done, will fax to [facility] for pt.  She did have there #.

## 2020-09-13 NOTE — Addendum Note (Signed)
Addended by: Debbora Presto L on: 09/13/2020 09:24 AM   Modules accepted: Orders

## 2020-09-13 NOTE — Addendum Note (Signed)
Addended by: Brandon Melnick on: 09/13/2020 09:20 AM   Modules accepted: Orders

## 2020-09-14 ENCOUNTER — Encounter: Payer: Self-pay | Admitting: Podiatry

## 2020-09-14 NOTE — Telephone Encounter (Addendum)
Order for AFO splint has been faxed to [employer] at [phone] Confirmation received

## 2020-09-14 NOTE — Progress Notes (Signed)
Subjective: [name] presents today for follow up of follow up preulcerative lesion distal tip right 4th digit, healed ulcer submet head 5 left foot and painful mycotic nails b/l that are difficult to trim. Pain interferes with ambulation. Aggravating factors include wearing enclosed shoe gear. Pain is relieved with periodic professional debridement.   She is now in a wheelchair after falling and breaking her right hip.States she was in [place] in April and spent 2 1/2 months in rehab. She is now back home and has a live in caretaker.  She also states she may have scraped her left foot with the toenails from her right foot due to rubbing. She has been keeping the area clean, dry and protected.   Allergies  Allergen Reactions  . Sulfasalazine Anaphylaxis  . Aspirin Nausea Only  . Penicillins Diarrhea  . Sulfa Antibiotics      Objective: There were no vitals filed for this visit.  Pt is a pleasant 86 y.o. year old Caucasian  female  in NAD. AAO x 3.   Vascular Examination:  Capillary refill time to digits immediate b/l. Palpable DP pulses b/l. Faintly palpable PT pulses b/l. Pedal hair absent b/l Skin temperature gradient within normal limits b/l. Dependent rubor noted b/l. Edema b/l LE left >right.  Dermatological Examination: Pedal skin with normal turgor, texture and tone bilaterally. No open wounds bilaterally. No interdigital macerations bilaterally. Toenails 1-5 b/l elongated, discolored, dystrophic, thickened, crumbly with subungual debris and tenderness to dorsal palpation. No hyperkeratotic nor porokeratotic lesions present on today's visit.   Healing abrasion noted medial aspec left forefoot. Now a scab with no erythema, no edema, no drainage, no fluctuance.  Musculoskeletal: Normal muscle strength 5/5 to all lower extremity muscle groups bilaterally. Hammertoes noted to the 1-5 bilaterally. Utilizes wheelchair for mobility assistance.  Right lower extremity  with fixed inversion.  Neurological: Protective sensation intact 5/5 intact bilaterally with 10g monofilament b/l.  Involuntary spasms noted b/l feet.  Assessment: 1. Pain due to onychomycosis of toenails of both feet   2. Acquired hammertoes of both feet   3. Multiple sclerosis ([place])   4. Rheumatoid arthritis involving multiple sites with positive rheumatoid factor (HCC)    Plan: -Toenails 1-5 b/l were debrided in length and girth with sterile nail nippers and dremel without iatrogenic bleeding.  -She has no corns on today's visit. She is using a wheelchair now. -[name] is to continue to protect healing abrasion daily.  -Corn(s) debrided L 2nd toe and R 3rd toe without complication or incident. Total number debrided=2. -Patient to continue soft, supportive shoe gear daily. -Patient to report any pedal injuries to medical professional immediately. -Patient/POA to call should there be question/concern in the interim.  Return in about 3 months (around 12/12/2020).

## 2020-09-19 DIAGNOSIS — M800AXG Age-related osteoporosis with current pathological fracture, other site, subsequent encounter for fracture with delayed healing: Secondary | ICD-10-CM | POA: Diagnosis not present

## 2020-09-19 DIAGNOSIS — M130 Polyarthritis, unspecified: Secondary | ICD-10-CM | POA: Diagnosis not present

## 2020-09-19 DIAGNOSIS — G35 Multiple sclerosis: Secondary | ICD-10-CM | POA: Diagnosis not present

## 2020-09-19 DIAGNOSIS — M21372 Foot drop, left foot: Secondary | ICD-10-CM | POA: Diagnosis not present

## 2020-09-19 DIAGNOSIS — M80051D Age-related osteoporosis with current pathological fracture, right femur, subsequent encounter for fracture with routine healing: Secondary | ICD-10-CM | POA: Diagnosis not present

## 2020-09-19 DIAGNOSIS — M069 Rheumatoid arthritis, unspecified: Secondary | ICD-10-CM | POA: Diagnosis not present

## 2020-09-20 DIAGNOSIS — G35 Multiple sclerosis: Secondary | ICD-10-CM | POA: Diagnosis not present

## 2020-09-20 DIAGNOSIS — M069 Rheumatoid arthritis, unspecified: Secondary | ICD-10-CM | POA: Diagnosis not present

## 2020-09-20 DIAGNOSIS — M130 Polyarthritis, unspecified: Secondary | ICD-10-CM | POA: Diagnosis not present

## 2020-09-20 DIAGNOSIS — M80051D Age-related osteoporosis with current pathological fracture, right femur, subsequent encounter for fracture with routine healing: Secondary | ICD-10-CM | POA: Diagnosis not present

## 2020-09-20 DIAGNOSIS — M21372 Foot drop, left foot: Secondary | ICD-10-CM | POA: Diagnosis not present

## 2020-09-20 DIAGNOSIS — M800AXG Age-related osteoporosis with current pathological fracture, other site, subsequent encounter for fracture with delayed healing: Secondary | ICD-10-CM | POA: Diagnosis not present

## 2020-09-26 DIAGNOSIS — M80051D Age-related osteoporosis with current pathological fracture, right femur, subsequent encounter for fracture with routine healing: Secondary | ICD-10-CM | POA: Diagnosis not present

## 2020-09-26 DIAGNOSIS — G35 Multiple sclerosis: Secondary | ICD-10-CM | POA: Diagnosis not present

## 2020-09-26 DIAGNOSIS — M21372 Foot drop, left foot: Secondary | ICD-10-CM | POA: Diagnosis not present

## 2020-09-26 DIAGNOSIS — M130 Polyarthritis, unspecified: Secondary | ICD-10-CM | POA: Diagnosis not present

## 2020-09-26 DIAGNOSIS — M069 Rheumatoid arthritis, unspecified: Secondary | ICD-10-CM | POA: Diagnosis not present

## 2020-09-26 DIAGNOSIS — M800AXG Age-related osteoporosis with current pathological fracture, other site, subsequent encounter for fracture with delayed healing: Secondary | ICD-10-CM | POA: Diagnosis not present

## 2020-09-27 DIAGNOSIS — M130 Polyarthritis, unspecified: Secondary | ICD-10-CM | POA: Diagnosis not present

## 2020-09-27 DIAGNOSIS — M80051D Age-related osteoporosis with current pathological fracture, right femur, subsequent encounter for fracture with routine healing: Secondary | ICD-10-CM | POA: Diagnosis not present

## 2020-09-27 DIAGNOSIS — G35 Multiple sclerosis: Secondary | ICD-10-CM | POA: Diagnosis not present

## 2020-09-27 DIAGNOSIS — M800AXG Age-related osteoporosis with current pathological fracture, other site, subsequent encounter for fracture with delayed healing: Secondary | ICD-10-CM | POA: Diagnosis not present

## 2020-09-27 DIAGNOSIS — M069 Rheumatoid arthritis, unspecified: Secondary | ICD-10-CM | POA: Diagnosis not present

## 2020-09-27 DIAGNOSIS — M21372 Foot drop, left foot: Secondary | ICD-10-CM | POA: Diagnosis not present

## 2020-10-04 DIAGNOSIS — G35 Multiple sclerosis: Secondary | ICD-10-CM | POA: Diagnosis not present

## 2020-10-04 DIAGNOSIS — M21372 Foot drop, left foot: Secondary | ICD-10-CM | POA: Diagnosis not present

## 2020-10-04 DIAGNOSIS — M130 Polyarthritis, unspecified: Secondary | ICD-10-CM | POA: Diagnosis not present

## 2020-10-04 DIAGNOSIS — M800AXG Age-related osteoporosis with current pathological fracture, other site, subsequent encounter for fracture with delayed healing: Secondary | ICD-10-CM | POA: Diagnosis not present

## 2020-10-04 DIAGNOSIS — M069 Rheumatoid arthritis, unspecified: Secondary | ICD-10-CM | POA: Diagnosis not present

## 2020-10-04 DIAGNOSIS — M80051D Age-related osteoporosis with current pathological fracture, right femur, subsequent encounter for fracture with routine healing: Secondary | ICD-10-CM | POA: Diagnosis not present

## 2020-10-05 DIAGNOSIS — M069 Rheumatoid arthritis, unspecified: Secondary | ICD-10-CM | POA: Diagnosis not present

## 2020-10-05 DIAGNOSIS — M21372 Foot drop, left foot: Secondary | ICD-10-CM | POA: Diagnosis not present

## 2020-10-05 DIAGNOSIS — G35 Multiple sclerosis: Secondary | ICD-10-CM | POA: Diagnosis not present

## 2020-10-05 DIAGNOSIS — M800AXG Age-related osteoporosis with current pathological fracture, other site, subsequent encounter for fracture with delayed healing: Secondary | ICD-10-CM | POA: Diagnosis not present

## 2020-10-05 DIAGNOSIS — M80051D Age-related osteoporosis with current pathological fracture, right femur, subsequent encounter for fracture with routine healing: Secondary | ICD-10-CM | POA: Diagnosis not present

## 2020-10-05 DIAGNOSIS — M130 Polyarthritis, unspecified: Secondary | ICD-10-CM | POA: Diagnosis not present

## 2020-10-06 DIAGNOSIS — R296 Repeated falls: Secondary | ICD-10-CM | POA: Diagnosis not present

## 2020-10-06 DIAGNOSIS — I872 Venous insufficiency (chronic) (peripheral): Secondary | ICD-10-CM | POA: Diagnosis not present

## 2020-10-06 DIAGNOSIS — Z79891 Long term (current) use of opiate analgesic: Secondary | ICD-10-CM | POA: Diagnosis not present

## 2020-10-06 DIAGNOSIS — M069 Rheumatoid arthritis, unspecified: Secondary | ICD-10-CM | POA: Diagnosis not present

## 2020-10-06 DIAGNOSIS — E871 Hypo-osmolality and hyponatremia: Secondary | ICD-10-CM | POA: Diagnosis not present

## 2020-10-06 DIAGNOSIS — G35 Multiple sclerosis: Secondary | ICD-10-CM | POA: Diagnosis not present

## 2020-10-06 DIAGNOSIS — W19XXXD Unspecified fall, subsequent encounter: Secondary | ICD-10-CM | POA: Diagnosis not present

## 2020-10-06 DIAGNOSIS — M316 Other giant cell arteritis: Secondary | ICD-10-CM | POA: Diagnosis not present

## 2020-10-06 DIAGNOSIS — I119 Hypertensive heart disease without heart failure: Secondary | ICD-10-CM | POA: Diagnosis not present

## 2020-10-06 DIAGNOSIS — G959 Disease of spinal cord, unspecified: Secondary | ICD-10-CM | POA: Diagnosis not present

## 2020-10-06 DIAGNOSIS — M130 Polyarthritis, unspecified: Secondary | ICD-10-CM | POA: Diagnosis not present

## 2020-10-06 DIAGNOSIS — M80051G Age-related osteoporosis with current pathological fracture, right femur, subsequent encounter for fracture with delayed healing: Secondary | ICD-10-CM | POA: Diagnosis not present

## 2020-10-06 DIAGNOSIS — M8580 Other specified disorders of bone density and structure, unspecified site: Secondary | ICD-10-CM | POA: Diagnosis not present

## 2020-10-06 DIAGNOSIS — R32 Unspecified urinary incontinence: Secondary | ICD-10-CM | POA: Diagnosis not present

## 2020-10-06 DIAGNOSIS — R600 Localized edema: Secondary | ICD-10-CM | POA: Diagnosis not present

## 2020-10-06 DIAGNOSIS — E034 Atrophy of thyroid (acquired): Secondary | ICD-10-CM | POA: Diagnosis not present

## 2020-10-06 DIAGNOSIS — Z993 Dependence on wheelchair: Secondary | ICD-10-CM | POA: Diagnosis not present

## 2020-10-06 DIAGNOSIS — M21372 Foot drop, left foot: Secondary | ICD-10-CM | POA: Diagnosis not present

## 2020-10-06 DIAGNOSIS — H9190 Unspecified hearing loss, unspecified ear: Secondary | ICD-10-CM | POA: Diagnosis not present

## 2020-10-06 DIAGNOSIS — Z791 Long term (current) use of non-steroidal anti-inflammatories (NSAID): Secondary | ICD-10-CM | POA: Diagnosis not present

## 2020-10-06 DIAGNOSIS — I2510 Atherosclerotic heart disease of native coronary artery without angina pectoris: Secondary | ICD-10-CM | POA: Diagnosis not present

## 2020-10-06 DIAGNOSIS — Z79899 Other long term (current) drug therapy: Secondary | ICD-10-CM | POA: Diagnosis not present

## 2020-10-06 DIAGNOSIS — Z9181 History of falling: Secondary | ICD-10-CM | POA: Diagnosis not present

## 2020-10-09 ENCOUNTER — Telehealth: Payer: Self-pay | Admitting: *Deleted

## 2020-10-09 DIAGNOSIS — J029 Acute pharyngitis, unspecified: Secondary | ICD-10-CM | POA: Insufficient documentation

## 2020-10-09 DIAGNOSIS — H6041 Cholesteatoma of right external ear: Secondary | ICD-10-CM | POA: Diagnosis not present

## 2020-10-09 NOTE — Telephone Encounter (Signed)
Script for pt with [employer], L foot drop for signature inbox.

## 2020-10-09 NOTE — Telephone Encounter (Signed)
Signed and returned

## 2020-10-09 NOTE — Telephone Encounter (Signed)
Signed, faxed [phone] with confirmation.  Then mailed original to [facility] (in there envelope).

## 2020-10-11 DIAGNOSIS — M069 Rheumatoid arthritis, unspecified: Secondary | ICD-10-CM | POA: Diagnosis not present

## 2020-10-11 DIAGNOSIS — M21372 Foot drop, left foot: Secondary | ICD-10-CM | POA: Diagnosis not present

## 2020-10-11 DIAGNOSIS — G35 Multiple sclerosis: Secondary | ICD-10-CM | POA: Diagnosis not present

## 2020-10-11 DIAGNOSIS — R296 Repeated falls: Secondary | ICD-10-CM | POA: Diagnosis not present

## 2020-10-11 DIAGNOSIS — M130 Polyarthritis, unspecified: Secondary | ICD-10-CM | POA: Diagnosis not present

## 2020-10-11 DIAGNOSIS — M80051G Age-related osteoporosis with current pathological fracture, right femur, subsequent encounter for fracture with delayed healing: Secondary | ICD-10-CM | POA: Diagnosis not present

## 2020-10-14 DIAGNOSIS — M21372 Foot drop, left foot: Secondary | ICD-10-CM | POA: Diagnosis not present

## 2020-10-14 DIAGNOSIS — M80051G Age-related osteoporosis with current pathological fracture, right femur, subsequent encounter for fracture with delayed healing: Secondary | ICD-10-CM | POA: Diagnosis not present

## 2020-10-14 DIAGNOSIS — M130 Polyarthritis, unspecified: Secondary | ICD-10-CM | POA: Diagnosis not present

## 2020-10-14 DIAGNOSIS — M069 Rheumatoid arthritis, unspecified: Secondary | ICD-10-CM | POA: Diagnosis not present

## 2020-10-14 DIAGNOSIS — G35 Multiple sclerosis: Secondary | ICD-10-CM | POA: Diagnosis not present

## 2020-10-14 DIAGNOSIS — R296 Repeated falls: Secondary | ICD-10-CM | POA: Diagnosis not present

## 2020-10-19 DIAGNOSIS — G35 Multiple sclerosis: Secondary | ICD-10-CM | POA: Diagnosis not present

## 2020-10-19 DIAGNOSIS — R296 Repeated falls: Secondary | ICD-10-CM | POA: Diagnosis not present

## 2020-10-19 DIAGNOSIS — M21372 Foot drop, left foot: Secondary | ICD-10-CM | POA: Diagnosis not present

## 2020-10-19 DIAGNOSIS — M80051G Age-related osteoporosis with current pathological fracture, right femur, subsequent encounter for fracture with delayed healing: Secondary | ICD-10-CM | POA: Diagnosis not present

## 2020-10-19 DIAGNOSIS — M069 Rheumatoid arthritis, unspecified: Secondary | ICD-10-CM | POA: Diagnosis not present

## 2020-10-19 DIAGNOSIS — M130 Polyarthritis, unspecified: Secondary | ICD-10-CM | POA: Diagnosis not present

## 2020-10-20 DIAGNOSIS — M21372 Foot drop, left foot: Secondary | ICD-10-CM | POA: Diagnosis not present

## 2020-10-20 DIAGNOSIS — M80051G Age-related osteoporosis with current pathological fracture, right femur, subsequent encounter for fracture with delayed healing: Secondary | ICD-10-CM | POA: Diagnosis not present

## 2020-10-20 DIAGNOSIS — G35 Multiple sclerosis: Secondary | ICD-10-CM | POA: Diagnosis not present

## 2020-10-20 DIAGNOSIS — M130 Polyarthritis, unspecified: Secondary | ICD-10-CM | POA: Diagnosis not present

## 2020-10-20 DIAGNOSIS — M069 Rheumatoid arthritis, unspecified: Secondary | ICD-10-CM | POA: Diagnosis not present

## 2020-10-20 DIAGNOSIS — R296 Repeated falls: Secondary | ICD-10-CM | POA: Diagnosis not present

## 2020-10-23 DIAGNOSIS — M80051G Age-related osteoporosis with current pathological fracture, right femur, subsequent encounter for fracture with delayed healing: Secondary | ICD-10-CM | POA: Diagnosis not present

## 2020-10-23 DIAGNOSIS — R296 Repeated falls: Secondary | ICD-10-CM | POA: Diagnosis not present

## 2020-10-23 DIAGNOSIS — G35 Multiple sclerosis: Secondary | ICD-10-CM | POA: Diagnosis not present

## 2020-10-23 DIAGNOSIS — M21372 Foot drop, left foot: Secondary | ICD-10-CM | POA: Diagnosis not present

## 2020-10-23 DIAGNOSIS — M130 Polyarthritis, unspecified: Secondary | ICD-10-CM | POA: Diagnosis not present

## 2020-10-23 DIAGNOSIS — M069 Rheumatoid arthritis, unspecified: Secondary | ICD-10-CM | POA: Diagnosis not present

## 2020-10-25 DIAGNOSIS — M069 Rheumatoid arthritis, unspecified: Secondary | ICD-10-CM | POA: Diagnosis not present

## 2020-10-25 DIAGNOSIS — M130 Polyarthritis, unspecified: Secondary | ICD-10-CM | POA: Diagnosis not present

## 2020-10-25 DIAGNOSIS — R296 Repeated falls: Secondary | ICD-10-CM | POA: Diagnosis not present

## 2020-10-25 DIAGNOSIS — G35 Multiple sclerosis: Secondary | ICD-10-CM | POA: Diagnosis not present

## 2020-10-25 DIAGNOSIS — M80051G Age-related osteoporosis with current pathological fracture, right femur, subsequent encounter for fracture with delayed healing: Secondary | ICD-10-CM | POA: Diagnosis not present

## 2020-10-25 DIAGNOSIS — M21372 Foot drop, left foot: Secondary | ICD-10-CM | POA: Diagnosis not present

## 2020-10-30 DIAGNOSIS — M069 Rheumatoid arthritis, unspecified: Secondary | ICD-10-CM | POA: Diagnosis not present

## 2020-10-30 DIAGNOSIS — M21372 Foot drop, left foot: Secondary | ICD-10-CM | POA: Diagnosis not present

## 2020-10-30 DIAGNOSIS — G35 Multiple sclerosis: Secondary | ICD-10-CM | POA: Diagnosis not present

## 2020-10-30 DIAGNOSIS — M130 Polyarthritis, unspecified: Secondary | ICD-10-CM | POA: Diagnosis not present

## 2020-10-30 DIAGNOSIS — M80051G Age-related osteoporosis with current pathological fracture, right femur, subsequent encounter for fracture with delayed healing: Secondary | ICD-10-CM | POA: Diagnosis not present

## 2020-10-30 DIAGNOSIS — R296 Repeated falls: Secondary | ICD-10-CM | POA: Diagnosis not present

## 2020-11-05 DIAGNOSIS — R296 Repeated falls: Secondary | ICD-10-CM | POA: Diagnosis not present

## 2020-11-05 DIAGNOSIS — Z9181 History of falling: Secondary | ICD-10-CM | POA: Diagnosis not present

## 2020-11-05 DIAGNOSIS — G35 Multiple sclerosis: Secondary | ICD-10-CM | POA: Diagnosis not present

## 2020-11-05 DIAGNOSIS — I872 Venous insufficiency (chronic) (peripheral): Secondary | ICD-10-CM | POA: Diagnosis not present

## 2020-11-05 DIAGNOSIS — Z79891 Long term (current) use of opiate analgesic: Secondary | ICD-10-CM | POA: Diagnosis not present

## 2020-11-05 DIAGNOSIS — M80051G Age-related osteoporosis with current pathological fracture, right femur, subsequent encounter for fracture with delayed healing: Secondary | ICD-10-CM | POA: Diagnosis not present

## 2020-11-05 DIAGNOSIS — R32 Unspecified urinary incontinence: Secondary | ICD-10-CM | POA: Diagnosis not present

## 2020-11-05 DIAGNOSIS — H9190 Unspecified hearing loss, unspecified ear: Secondary | ICD-10-CM | POA: Diagnosis not present

## 2020-11-05 DIAGNOSIS — E034 Atrophy of thyroid (acquired): Secondary | ICD-10-CM | POA: Diagnosis not present

## 2020-11-05 DIAGNOSIS — E871 Hypo-osmolality and hyponatremia: Secondary | ICD-10-CM | POA: Diagnosis not present

## 2020-11-05 DIAGNOSIS — M130 Polyarthritis, unspecified: Secondary | ICD-10-CM | POA: Diagnosis not present

## 2020-11-05 DIAGNOSIS — G959 Disease of spinal cord, unspecified: Secondary | ICD-10-CM | POA: Diagnosis not present

## 2020-11-05 DIAGNOSIS — M069 Rheumatoid arthritis, unspecified: Secondary | ICD-10-CM | POA: Diagnosis not present

## 2020-11-05 DIAGNOSIS — M21372 Foot drop, left foot: Secondary | ICD-10-CM | POA: Diagnosis not present

## 2020-11-05 DIAGNOSIS — I2510 Atherosclerotic heart disease of native coronary artery without angina pectoris: Secondary | ICD-10-CM | POA: Diagnosis not present

## 2020-11-05 DIAGNOSIS — Z993 Dependence on wheelchair: Secondary | ICD-10-CM | POA: Diagnosis not present

## 2020-11-05 DIAGNOSIS — R600 Localized edema: Secondary | ICD-10-CM | POA: Diagnosis not present

## 2020-11-05 DIAGNOSIS — W19XXXD Unspecified fall, subsequent encounter: Secondary | ICD-10-CM | POA: Diagnosis not present

## 2020-11-05 DIAGNOSIS — Z79899 Other long term (current) drug therapy: Secondary | ICD-10-CM | POA: Diagnosis not present

## 2020-11-05 DIAGNOSIS — I119 Hypertensive heart disease without heart failure: Secondary | ICD-10-CM | POA: Diagnosis not present

## 2020-11-05 DIAGNOSIS — Z791 Long term (current) use of non-steroidal anti-inflammatories (NSAID): Secondary | ICD-10-CM | POA: Diagnosis not present

## 2020-11-05 DIAGNOSIS — M316 Other giant cell arteritis: Secondary | ICD-10-CM | POA: Diagnosis not present

## 2020-11-05 DIAGNOSIS — M8580 Other specified disorders of bone density and structure, unspecified site: Secondary | ICD-10-CM | POA: Diagnosis not present

## 2020-11-06 DIAGNOSIS — Z23 Encounter for immunization: Secondary | ICD-10-CM | POA: Diagnosis not present

## 2020-11-07 DIAGNOSIS — M069 Rheumatoid arthritis, unspecified: Secondary | ICD-10-CM | POA: Diagnosis not present

## 2020-11-07 DIAGNOSIS — R296 Repeated falls: Secondary | ICD-10-CM | POA: Diagnosis not present

## 2020-11-07 DIAGNOSIS — M80051G Age-related osteoporosis with current pathological fracture, right femur, subsequent encounter for fracture with delayed healing: Secondary | ICD-10-CM | POA: Diagnosis not present

## 2020-11-07 DIAGNOSIS — G35 Multiple sclerosis: Secondary | ICD-10-CM | POA: Diagnosis not present

## 2020-11-07 DIAGNOSIS — M21372 Foot drop, left foot: Secondary | ICD-10-CM | POA: Diagnosis not present

## 2020-11-07 DIAGNOSIS — M130 Polyarthritis, unspecified: Secondary | ICD-10-CM | POA: Diagnosis not present

## 2020-11-08 DIAGNOSIS — G35 Multiple sclerosis: Secondary | ICD-10-CM | POA: Diagnosis not present

## 2020-11-08 DIAGNOSIS — R296 Repeated falls: Secondary | ICD-10-CM | POA: Diagnosis not present

## 2020-11-08 DIAGNOSIS — M130 Polyarthritis, unspecified: Secondary | ICD-10-CM | POA: Diagnosis not present

## 2020-11-08 DIAGNOSIS — M069 Rheumatoid arthritis, unspecified: Secondary | ICD-10-CM | POA: Diagnosis not present

## 2020-11-08 DIAGNOSIS — M80051G Age-related osteoporosis with current pathological fracture, right femur, subsequent encounter for fracture with delayed healing: Secondary | ICD-10-CM | POA: Diagnosis not present

## 2020-11-08 DIAGNOSIS — M21372 Foot drop, left foot: Secondary | ICD-10-CM | POA: Diagnosis not present

## 2020-11-09 DIAGNOSIS — D472 Monoclonal gammopathy: Secondary | ICD-10-CM | POA: Diagnosis not present

## 2020-11-09 DIAGNOSIS — M79643 Pain in unspecified hand: Secondary | ICD-10-CM | POA: Diagnosis not present

## 2020-11-09 DIAGNOSIS — G35 Multiple sclerosis: Secondary | ICD-10-CM | POA: Diagnosis not present

## 2020-11-09 DIAGNOSIS — M810 Age-related osteoporosis without current pathological fracture: Secondary | ICD-10-CM | POA: Diagnosis not present

## 2020-11-09 DIAGNOSIS — M7989 Other specified soft tissue disorders: Secondary | ICD-10-CM | POA: Diagnosis not present

## 2020-11-09 DIAGNOSIS — L659 Nonscarring hair loss, unspecified: Secondary | ICD-10-CM | POA: Diagnosis not present

## 2020-11-09 DIAGNOSIS — I509 Heart failure, unspecified: Secondary | ICD-10-CM | POA: Diagnosis not present

## 2020-11-09 DIAGNOSIS — I878 Other specified disorders of veins: Secondary | ICD-10-CM | POA: Diagnosis not present

## 2020-11-09 DIAGNOSIS — Z79899 Other long term (current) drug therapy: Secondary | ICD-10-CM | POA: Diagnosis not present

## 2020-11-09 DIAGNOSIS — M0579 Rheumatoid arthritis with rheumatoid factor of multiple sites without organ or systems involvement: Secondary | ICD-10-CM | POA: Diagnosis not present

## 2020-11-13 DIAGNOSIS — M80051G Age-related osteoporosis with current pathological fracture, right femur, subsequent encounter for fracture with delayed healing: Secondary | ICD-10-CM | POA: Diagnosis not present

## 2020-11-13 DIAGNOSIS — M069 Rheumatoid arthritis, unspecified: Secondary | ICD-10-CM | POA: Diagnosis not present

## 2020-11-13 DIAGNOSIS — G35 Multiple sclerosis: Secondary | ICD-10-CM | POA: Diagnosis not present

## 2020-11-13 DIAGNOSIS — M21372 Foot drop, left foot: Secondary | ICD-10-CM | POA: Diagnosis not present

## 2020-11-13 DIAGNOSIS — M130 Polyarthritis, unspecified: Secondary | ICD-10-CM | POA: Diagnosis not present

## 2020-11-13 DIAGNOSIS — R296 Repeated falls: Secondary | ICD-10-CM | POA: Diagnosis not present

## 2020-11-15 DIAGNOSIS — M069 Rheumatoid arthritis, unspecified: Secondary | ICD-10-CM | POA: Diagnosis not present

## 2020-11-15 DIAGNOSIS — M130 Polyarthritis, unspecified: Secondary | ICD-10-CM | POA: Diagnosis not present

## 2020-11-15 DIAGNOSIS — M21372 Foot drop, left foot: Secondary | ICD-10-CM | POA: Diagnosis not present

## 2020-11-15 DIAGNOSIS — G35 Multiple sclerosis: Secondary | ICD-10-CM | POA: Diagnosis not present

## 2020-11-15 DIAGNOSIS — M80051G Age-related osteoporosis with current pathological fracture, right femur, subsequent encounter for fracture with delayed healing: Secondary | ICD-10-CM | POA: Diagnosis not present

## 2020-11-15 DIAGNOSIS — R296 Repeated falls: Secondary | ICD-10-CM | POA: Diagnosis not present

## 2020-11-21 DIAGNOSIS — M21372 Foot drop, left foot: Secondary | ICD-10-CM | POA: Diagnosis not present

## 2020-11-21 DIAGNOSIS — G35 Multiple sclerosis: Secondary | ICD-10-CM | POA: Diagnosis not present

## 2020-11-21 DIAGNOSIS — M069 Rheumatoid arthritis, unspecified: Secondary | ICD-10-CM | POA: Diagnosis not present

## 2020-11-21 DIAGNOSIS — R296 Repeated falls: Secondary | ICD-10-CM | POA: Diagnosis not present

## 2020-11-21 DIAGNOSIS — M80051G Age-related osteoporosis with current pathological fracture, right femur, subsequent encounter for fracture with delayed healing: Secondary | ICD-10-CM | POA: Diagnosis not present

## 2020-11-21 DIAGNOSIS — M130 Polyarthritis, unspecified: Secondary | ICD-10-CM | POA: Diagnosis not present

## 2020-11-24 DIAGNOSIS — M069 Rheumatoid arthritis, unspecified: Secondary | ICD-10-CM | POA: Diagnosis not present

## 2020-11-24 DIAGNOSIS — R296 Repeated falls: Secondary | ICD-10-CM | POA: Diagnosis not present

## 2020-11-24 DIAGNOSIS — M130 Polyarthritis, unspecified: Secondary | ICD-10-CM | POA: Diagnosis not present

## 2020-11-24 DIAGNOSIS — G35 Multiple sclerosis: Secondary | ICD-10-CM | POA: Diagnosis not present

## 2020-11-24 DIAGNOSIS — M80051G Age-related osteoporosis with current pathological fracture, right femur, subsequent encounter for fracture with delayed healing: Secondary | ICD-10-CM | POA: Diagnosis not present

## 2020-11-24 DIAGNOSIS — M21372 Foot drop, left foot: Secondary | ICD-10-CM | POA: Diagnosis not present

## 2020-11-28 DIAGNOSIS — R296 Repeated falls: Secondary | ICD-10-CM | POA: Diagnosis not present

## 2020-11-28 DIAGNOSIS — G35 Multiple sclerosis: Secondary | ICD-10-CM | POA: Diagnosis not present

## 2020-11-28 DIAGNOSIS — M21372 Foot drop, left foot: Secondary | ICD-10-CM | POA: Diagnosis not present

## 2020-11-28 DIAGNOSIS — M130 Polyarthritis, unspecified: Secondary | ICD-10-CM | POA: Diagnosis not present

## 2020-11-28 DIAGNOSIS — M069 Rheumatoid arthritis, unspecified: Secondary | ICD-10-CM | POA: Diagnosis not present

## 2020-11-28 DIAGNOSIS — M80051G Age-related osteoporosis with current pathological fracture, right femur, subsequent encounter for fracture with delayed healing: Secondary | ICD-10-CM | POA: Diagnosis not present

## 2020-11-30 DIAGNOSIS — R296 Repeated falls: Secondary | ICD-10-CM | POA: Diagnosis not present

## 2020-11-30 DIAGNOSIS — H353132 Nonexudative age-related macular degeneration, bilateral, intermediate dry stage: Secondary | ICD-10-CM | POA: Diagnosis not present

## 2020-11-30 DIAGNOSIS — M21372 Foot drop, left foot: Secondary | ICD-10-CM | POA: Diagnosis not present

## 2020-11-30 DIAGNOSIS — G35 Multiple sclerosis: Secondary | ICD-10-CM | POA: Diagnosis not present

## 2020-11-30 DIAGNOSIS — M130 Polyarthritis, unspecified: Secondary | ICD-10-CM | POA: Diagnosis not present

## 2020-11-30 DIAGNOSIS — M069 Rheumatoid arthritis, unspecified: Secondary | ICD-10-CM | POA: Diagnosis not present

## 2020-11-30 DIAGNOSIS — M80051G Age-related osteoporosis with current pathological fracture, right femur, subsequent encounter for fracture with delayed healing: Secondary | ICD-10-CM | POA: Diagnosis not present

## 2020-12-04 DIAGNOSIS — M80051G Age-related osteoporosis with current pathological fracture, right femur, subsequent encounter for fracture with delayed healing: Secondary | ICD-10-CM | POA: Diagnosis not present

## 2020-12-04 DIAGNOSIS — M130 Polyarthritis, unspecified: Secondary | ICD-10-CM | POA: Diagnosis not present

## 2020-12-04 DIAGNOSIS — R296 Repeated falls: Secondary | ICD-10-CM | POA: Diagnosis not present

## 2020-12-04 DIAGNOSIS — M21372 Foot drop, left foot: Secondary | ICD-10-CM | POA: Diagnosis not present

## 2020-12-04 DIAGNOSIS — M069 Rheumatoid arthritis, unspecified: Secondary | ICD-10-CM | POA: Diagnosis not present

## 2020-12-04 DIAGNOSIS — G35 Multiple sclerosis: Secondary | ICD-10-CM | POA: Diagnosis not present

## 2020-12-05 ENCOUNTER — Encounter: Payer: Self-pay | Admitting: Family Medicine

## 2020-12-05 DIAGNOSIS — G35 Multiple sclerosis: Secondary | ICD-10-CM

## 2020-12-05 DIAGNOSIS — M80051A Age-related osteoporosis with current pathological fracture, right femur, initial encounter for fracture: Secondary | ICD-10-CM | POA: Diagnosis not present

## 2020-12-05 DIAGNOSIS — R32 Unspecified urinary incontinence: Secondary | ICD-10-CM | POA: Diagnosis not present

## 2020-12-05 DIAGNOSIS — M80051G Age-related osteoporosis with current pathological fracture, right femur, subsequent encounter for fracture with delayed healing: Secondary | ICD-10-CM | POA: Diagnosis not present

## 2020-12-05 DIAGNOSIS — M069 Rheumatoid arthritis, unspecified: Secondary | ICD-10-CM | POA: Diagnosis not present

## 2020-12-05 DIAGNOSIS — I2510 Atherosclerotic heart disease of native coronary artery without angina pectoris: Secondary | ICD-10-CM | POA: Diagnosis not present

## 2020-12-05 DIAGNOSIS — R600 Localized edema: Secondary | ICD-10-CM | POA: Diagnosis not present

## 2020-12-05 DIAGNOSIS — I119 Hypertensive heart disease without heart failure: Secondary | ICD-10-CM | POA: Diagnosis not present

## 2020-12-05 DIAGNOSIS — E034 Atrophy of thyroid (acquired): Secondary | ICD-10-CM | POA: Diagnosis not present

## 2020-12-05 DIAGNOSIS — M8580 Other specified disorders of bone density and structure, unspecified site: Secondary | ICD-10-CM | POA: Diagnosis not present

## 2020-12-05 DIAGNOSIS — M130 Polyarthritis, unspecified: Secondary | ICD-10-CM | POA: Diagnosis not present

## 2020-12-05 DIAGNOSIS — I872 Venous insufficiency (chronic) (peripheral): Secondary | ICD-10-CM | POA: Diagnosis not present

## 2020-12-05 DIAGNOSIS — W19XXXD Unspecified fall, subsequent encounter: Secondary | ICD-10-CM | POA: Diagnosis not present

## 2020-12-05 DIAGNOSIS — G959 Disease of spinal cord, unspecified: Secondary | ICD-10-CM | POA: Diagnosis not present

## 2020-12-05 DIAGNOSIS — E871 Hypo-osmolality and hyponatremia: Secondary | ICD-10-CM | POA: Diagnosis not present

## 2020-12-05 DIAGNOSIS — M21372 Foot drop, left foot: Secondary | ICD-10-CM | POA: Diagnosis not present

## 2020-12-05 DIAGNOSIS — Z9181 History of falling: Secondary | ICD-10-CM | POA: Diagnosis not present

## 2020-12-05 DIAGNOSIS — M316 Other giant cell arteritis: Secondary | ICD-10-CM | POA: Diagnosis not present

## 2020-12-05 DIAGNOSIS — R296 Repeated falls: Secondary | ICD-10-CM | POA: Diagnosis not present

## 2020-12-05 DIAGNOSIS — Z993 Dependence on wheelchair: Secondary | ICD-10-CM | POA: Diagnosis not present

## 2020-12-05 DIAGNOSIS — Z79899 Other long term (current) drug therapy: Secondary | ICD-10-CM | POA: Diagnosis not present

## 2020-12-05 DIAGNOSIS — Z791 Long term (current) use of non-steroidal anti-inflammatories (NSAID): Secondary | ICD-10-CM | POA: Diagnosis not present

## 2020-12-05 DIAGNOSIS — Z79891 Long term (current) use of opiate analgesic: Secondary | ICD-10-CM | POA: Diagnosis not present

## 2020-12-05 DIAGNOSIS — H9190 Unspecified hearing loss, unspecified ear: Secondary | ICD-10-CM | POA: Diagnosis not present

## 2020-12-13 ENCOUNTER — Ambulatory Visit (INDEPENDENT_AMBULATORY_CARE_PROVIDER_SITE_OTHER): Payer: Medicare Other | Admitting: Podiatry

## 2020-12-13 ENCOUNTER — Other Ambulatory Visit: Payer: Self-pay

## 2020-12-13 ENCOUNTER — Encounter: Payer: Self-pay | Admitting: Podiatry

## 2020-12-13 DIAGNOSIS — G35 Multiple sclerosis: Secondary | ICD-10-CM | POA: Insufficient documentation

## 2020-12-13 DIAGNOSIS — L89899 Pressure ulcer of other site, unspecified stage: Secondary | ICD-10-CM | POA: Diagnosis not present

## 2020-12-13 DIAGNOSIS — M2042 Other hammer toe(s) (acquired), left foot: Secondary | ICD-10-CM

## 2020-12-13 DIAGNOSIS — M810 Age-related osteoporosis without current pathological fracture: Secondary | ICD-10-CM | POA: Insufficient documentation

## 2020-12-13 DIAGNOSIS — M79674 Pain in right toe(s): Secondary | ICD-10-CM | POA: Diagnosis not present

## 2020-12-13 DIAGNOSIS — M79675 Pain in left toe(s): Secondary | ICD-10-CM

## 2020-12-13 DIAGNOSIS — R899 Unspecified abnormal finding in specimens from other organs, systems and tissues: Secondary | ICD-10-CM | POA: Insufficient documentation

## 2020-12-13 DIAGNOSIS — D472 Monoclonal gammopathy: Secondary | ICD-10-CM | POA: Insufficient documentation

## 2020-12-13 DIAGNOSIS — I509 Heart failure, unspecified: Secondary | ICD-10-CM | POA: Insufficient documentation

## 2020-12-13 DIAGNOSIS — M2041 Other hammer toe(s) (acquired), right foot: Secondary | ICD-10-CM | POA: Diagnosis not present

## 2020-12-13 DIAGNOSIS — B351 Tinea unguium: Secondary | ICD-10-CM

## 2020-12-13 DIAGNOSIS — E559 Vitamin D deficiency, unspecified: Secondary | ICD-10-CM | POA: Insufficient documentation

## 2020-12-14 NOTE — Progress Notes (Signed)
Subjective: [name] presents today for follow up of follow up preulcerative lesion distal tip right 4th digit, healed ulcer submet head 5 left foot and painful mycotic nails b/l that are difficult to trim. Pain interferes with ambulation. Aggravating factors include wearing enclosed shoe gear. Pain is relieved with periodic professional debridement.   She is wheelchair bound due to MS.  She is now back home and has a live in caretaker.  She has brought in a shoe with brace attached. States her therapist thought this was the cause of her pressure sore on the bottom of her left foot.  Allergies  Allergen Reactions  . Sulfasalazine Anaphylaxis  . Aspirin Nausea Only  . Penicillins Diarrhea  . Sulfa Antibiotics   . Sulfamethoxazole-Trimethoprim Other (See Comments)     Objective: There were no vitals filed for this visit.  Pt is a pleasant 87 y.o. year old Caucasian  female  in NAD. AAO x 3.   Vascular Examination:  Capillary refill time to digits immediate b/l. Palpable DP pulses b/l. Faintly palpable PT pulses b/l. Pedal hair absent b/l Skin temperature gradient within normal limits b/l. Dependent rubor noted b/l. Edema b/l LE left >right.  Dermatological Examination: Pedal skin with normal turgor, texture and tone bilaterally. No open wounds bilaterally. No interdigital macerations bilaterally. Toenails 1-5 b/l elongated, discolored, dystrophic, thickened, crumbly with subungual debris and tenderness to dorsal palpation. No hyperkeratotic nor porokeratotic lesions present on today's visit.    Left foot pressure ulcer noted measuring 2.5 x 2.0 cm with subdermal hemorrhage. No erythema, no edema, no fluctuance.   Musculoskeletal: Normal muscle strength 5/5 to all lower extremity muscle groups bilaterally. Hammertoes noted to the 1-5 bilaterally. Utilizes wheelchair for mobility assistance.  Left lower extremity with fixed inversion.    Neurological: Protective  sensation intact 5/5 intact bilaterally with 10g monofilament b/l.  Involuntary spasms noted b/l feet.  Assessment: 1. Pain due to onychomycosis of toenails of both feet   2. Acquired hammertoes of both feet   3. Pressure injury of skin of left foot, unspecified injury stage   4. Multiple sclerosis (HCC)    Plan: -Toenails 1-5 b/l were debrided in length and girth with sterile nail nippers and dremel without iatrogenic bleeding.  -Discussed pressure precautions. Lesion on plantar aspect of left foot is resolving. Dispensed heel protectors and dispensed one pair of heel protectors. Use whenever she is in bed.  -Patient to continue soft, supportive shoe gear daily. -Patient to report any pedal injuries to medical professional immediately. -Patient/POA to call should there be question/concern in the interim.  Return in about 3 months (around 03/13/2021).

## 2020-12-15 DIAGNOSIS — M21372 Foot drop, left foot: Secondary | ICD-10-CM | POA: Diagnosis not present

## 2020-12-15 DIAGNOSIS — M80051G Age-related osteoporosis with current pathological fracture, right femur, subsequent encounter for fracture with delayed healing: Secondary | ICD-10-CM | POA: Diagnosis not present

## 2020-12-15 DIAGNOSIS — M069 Rheumatoid arthritis, unspecified: Secondary | ICD-10-CM | POA: Diagnosis not present

## 2020-12-15 DIAGNOSIS — M130 Polyarthritis, unspecified: Secondary | ICD-10-CM | POA: Diagnosis not present

## 2020-12-15 DIAGNOSIS — I2510 Atherosclerotic heart disease of native coronary artery without angina pectoris: Secondary | ICD-10-CM | POA: Diagnosis not present

## 2020-12-15 DIAGNOSIS — G35 Multiple sclerosis: Secondary | ICD-10-CM | POA: Diagnosis not present

## 2020-12-21 ENCOUNTER — Telehealth: Payer: Self-pay | Admitting: Orthopedic Surgery

## 2020-12-21 NOTE — Telephone Encounter (Signed)
Can you please call pt and make an appt with Dr. Sharol Given. Would need eval in office.

## 2020-12-21 NOTE — Telephone Encounter (Signed)
Pt called stating its been a couple months since she fell yet she is still in pain and she would like a CB from Dr.Duda to explain why this is the case and what can be done about it.  [phone]

## 2020-12-22 DIAGNOSIS — M069 Rheumatoid arthritis, unspecified: Secondary | ICD-10-CM | POA: Diagnosis not present

## 2020-12-22 DIAGNOSIS — M80051G Age-related osteoporosis with current pathological fracture, right femur, subsequent encounter for fracture with delayed healing: Secondary | ICD-10-CM | POA: Diagnosis not present

## 2020-12-22 DIAGNOSIS — M21372 Foot drop, left foot: Secondary | ICD-10-CM | POA: Diagnosis not present

## 2020-12-22 DIAGNOSIS — M130 Polyarthritis, unspecified: Secondary | ICD-10-CM | POA: Diagnosis not present

## 2020-12-22 DIAGNOSIS — I2510 Atherosclerotic heart disease of native coronary artery without angina pectoris: Secondary | ICD-10-CM | POA: Diagnosis not present

## 2020-12-22 DIAGNOSIS — G35 Multiple sclerosis: Secondary | ICD-10-CM | POA: Diagnosis not present

## 2020-12-26 ENCOUNTER — Ambulatory Visit: Payer: Medicare Other | Admitting: Physician Assistant

## 2020-12-27 ENCOUNTER — Ambulatory Visit (INDEPENDENT_AMBULATORY_CARE_PROVIDER_SITE_OTHER): Payer: Medicare Other | Admitting: Neurology

## 2020-12-27 ENCOUNTER — Encounter: Payer: Self-pay | Admitting: Neurology

## 2020-12-27 VITALS — BP 118/69 | HR 69 | Ht 65.0 in

## 2020-12-27 DIAGNOSIS — G35 Multiple sclerosis: Secondary | ICD-10-CM | POA: Diagnosis not present

## 2020-12-27 DIAGNOSIS — M069 Rheumatoid arthritis, unspecified: Secondary | ICD-10-CM | POA: Diagnosis not present

## 2020-12-27 DIAGNOSIS — R39198 Other difficulties with micturition: Secondary | ICD-10-CM | POA: Diagnosis not present

## 2020-12-27 DIAGNOSIS — Z8781 Personal history of (healed) traumatic fracture: Secondary | ICD-10-CM | POA: Diagnosis not present

## 2020-12-27 MED ORDER — NABUMETONE 500 MG PO TABS: 500.0000 mg | ORAL_TABLET | Freq: Two times a day (BID) | ORAL | 5 refills | Status: DC | PRN

## 2020-12-27 NOTE — Progress Notes (Signed)
[facility]  PATIENT: [name] DOB: 1932/08/16  REFERRING DOCTOR OR PCP:  [name] SOURCE:   Patient, notes from Dr. Marisue Humble and Dr. Catalina Gravel, imaging results, MRI images on PACS.  _________________________________   HISTORICAL  CHIEF COMPLAINT:  Chief Complaint  Patient presents with  . Follow-up    RM 12, alone. Last seen 12/27/19 by AL,NP. Off DMT for MS. Finished PT. Currently doing OT once a week. [place] and broke right hip 03/23/20.     HISTORY OF PRESENT ILLNESS:  [name] is an 87 year old woman with probable primary progressive multiple sclerosis.   Update 1/18/02022: She is not walking due to leg weakness an hip fracture April 2021.Marland Kitchen    She broke her hip with a fall..  Her orthopedic surgeon is Herbie Drape She reports due to the nature of the fracture (closed right acetabular fx with superimposes humeral head fx. ), she could not get a hip replacement.   She did not heal well and is still wheelchair bound as she can not bear weight on the right.     She continues to have right hip pain.     She takes Tylenol  Sometimes she gets spasms in her legs but this is not constant.   She is not sure if ever on baclofen.   She is on methocarbamol prn and not sure if it helps.   She has mild right hand weakness but arms generally do well.    She can transfer to a toilet without assistance but due to instability she has aides who help.   She has some urinary incontinence at times.    Bladder is doing worse with urgency and some urge incontinence  Other:  She also has RA and is on leflunomide and methotrexate.      Her PCP is having her see Dr. Ferrel Logan at [facility] for a second opinion regarding her MS.      MS History:    She has had difficulty with her gait for many years. Her left leg seemed clumsy since at least 2000.   Her left toes would drag at times and her balance was poor.    About 2011, she fell at church and broke her pelvis tripping over  a gown.   Her gait continues to worsen.   Her right leg does well.   Her left arm is slightly clumsy and slightly weaker compared to her right.     All of her changes have been gradual.     She denies any numbness.   In 2014, she had MRIs showing many T2/FLAIR hyperintense foci in the brain. At her age,  the pattern was felt to be nonspecific. However, MRI of the cervical and thoracic spine showed a plaque adjacent to C2 to the right and a plaque adjacent to T6 to the left  IMAGING: MRIs of her brain and spine in 11/11/2013 were reviewed.   The MRI of the cervical spine shows a focus to the right at C2 and another focus to the left at C6. It also shows multilevel degenerative changes with anterolisthesis of C4 upon C5 but no nerve root compression.  No spinal cord compression.  The MRI of the thoracic spine shows a focus to the left adjacent to T6. It also showed a chronic T9 compression fracture (she was never aware of this).  MRI of the brain shows many T2/FLAIR hyperintense foci.  Foci are non-specific though many of the foci are radially oriented  to the ventricles in the periventricular white matter. Some foci in the pons have an appearance more typical for chronic microvascular ischemic change. MRI 2014 showed a chronic compression fracture at T9 and she can not think of no other episode of significant back pain.  FH: Her one paternal first cousin and 2 maternal cousins have MS.    She does not know whether they had PPMS or RRMS.    REVIEW OF SYSTEMS: Constitutional: No fevers, chills, sweats, or change in appetite.   Some fatigue.   Occ insomnia (sleep maintenance).    Pedal edema.   Eyes: No visual changes, double vision, eye pain Ear, nose and throat: No hearing loss, ear pain, nasal congestion, sore throat Cardiovascular: No chest pain, palpitations.   Echo shows grade 2 diastolic dysfunction.   Respiratory: No shortness of breath at rest or with exertion.   No wheezes.    GastrointestinaI:  No nausea, vomiting, diarrhea, abdominal pain, fecal incontinence Genitourinary: see above.  .    Musculoskeletal: No current neck pain, back pain Integumentary: No rash, pruritus, skin lesions Neurological: as above Psychiatric: Mild depression but no anxiety Endocrine: No palpitations, diaphoresis, change in appetite, change in weigh or increased thirst Hematologic/Lymphatic: No anemia, purpura, petechiae. Allergic/Immunologic: No itchy/runny eyes, nasal congestion, recent allergic reactions, rashes  ALLERGIES: Allergies  Allergen Reactions  . Sulfasalazine Anaphylaxis  . Aspirin Nausea Only  . Penicillins Diarrhea  . Sulfa Antibiotics   . Sulfamethoxazole-Trimethoprim Other (See Comments)    HOME MEDICATIONS:  Current Outpatient Medications:  .  acetaminophen (TYLENOL) 325 MG tablet, Take 650 mg by mouth every 6 (six) hours as needed., Disp: , Rfl:  .  acetaminophen (TYLENOL) 325 MG tablet, Take 650 mg by mouth every 8 (eight) hours. , Disp: , Rfl:  .  acetaminophen-codeine (TYLENOL #3) 300-30 MG tablet, Take 1 tablet by mouth every 6 (six) hours as needed for moderate pain., Disp: 42 tablet, Rfl: 0 .  Azelastine HCl 137 MCG/SPRAY SOLN, Place 1 spray into both nostrils 2 (two) times daily as needed (As needed for allergies)., Disp: 30 mL, Rfl: 0 .  Biotin 10000 MCG TABS, Take 1,000 mcg by mouth daily., Disp: , Rfl:  .  Calcium Carb-Cholecalciferol (CALCIUM-VITAMIN D3) 250-125 MG-UNIT TABS, Take 1 tablet by mouth 2 (two) times daily., Disp: , Rfl:  .  Carboxymethylcellul-Glycerin (LUBRICATING EYE DROPS OP), Place 1 drop into both eyes 2 (two) times daily. For Eye Dryness, Disp: , Rfl:  .  denosumab (PROLIA) 60 MG/ML SOSY injection, See admin instructions., Disp: , Rfl:  .  diclofenac Sodium (VOLTAREN) 1 % GEL, APPLY 2 GMS TOPICALLY TO AFFECTED AREAS (RIGHT HIP) TWICE DAILY AS NEEDED FOR PAIN, Disp: 50 g, Rfl: 0 .  leflunomide (ARAVA) 10 MG tablet, , Disp: , Rfl:  .   levothyroxine (SYNTHROID) 25 MCG tablet, Take 1 tablet (25 mcg total) by mouth daily., Disp: 30 tablet, Rfl: 0 .  losartan (COZAAR) 25 MG tablet, Take 1 tablet (25 mg total) by mouth daily., Disp: 30 tablet, Rfl: 0 .  Magnesium 500 MG TABS, Take 250 tablets by mouth every evening. 0.5 tablet, Disp: , Rfl:  .  magnesium hydroxide (MILK OF MAGNESIA) 400 MG/5ML suspension, Take 30 mLs by mouth daily as needed for mild constipation., Disp: , Rfl:  .  methocarbamol (ROBAXIN) 500 MG tablet, Take 500 mg by mouth every 8 (eight) hours as needed for muscle spasms. , Disp: , Rfl:  .  methocarbamol (ROBAXIN) 500 MG tablet, Take  1 tablet (500 mg total) by mouth 3 (three) times daily. ( WITH 1ST ADMINISTRATION EARLY AM), Disp: 90 tablet, Rfl: 0 .  methotrexate 2.5 MG tablet, Take 6 tablets (15 mg total) by mouth every Thursday. Give 6 tablets to = 15 mg, Disp: 25 tablet, Rfl: 0 .  Multiple Vitamins-Minerals (ICAPS AREDS FORMULA PO), Take 1 capsule by mouth daily. , Disp: , Rfl:  .  nabumetone (RELAFEN) 500 MG tablet, Take 1 tablet (500 mg total) by mouth 2 (two) times daily as needed., Disp: 60 tablet, Rfl: 5 .  NON FORMULARY, DIET: LIBERALIZE DIET TO REGULAR, Disp: , Rfl:  .  polyethylene glycol (MIRALAX / GLYCOLAX) 17 g packet, Take 17 g by mouth daily., Disp: 14 each, Rfl: 0 .  predniSONE (DELTASONE) 1 MG tablet, 3 tablets, Disp: , Rfl:  .  Sarilumab (KEVZARA) 200 MG/1.14ML SOSY, Inject 200 mg into the skin daily. Once every other week, Disp: 2.4 mL, Rfl: 0 .  senna-docusate (SENOKOT-S) 8.6-50 MG tablet, Take 1 tablet by mouth 2 (two) times daily., Disp:  , Rfl:  .  sodium chloride 1 g tablet, Take 1 tablet (1 g total) by mouth 3 (three) times daily with meals., Disp: 90 tablet, Rfl: 0 .  SODIUM PHOSPHATES RE, Place 1 each rectally daily as needed., Disp: , Rfl:  .  Vitamin D, Ergocalciferol, (DRISDOL) 1.25 MG (50000 UNIT) CAPS capsule, Take 50,000 Units by mouth every 7 (seven) days., Disp: , Rfl:   PAST  MEDICAL HISTORY: Past Medical History:  Diagnosis Date  . Chronic venous insufficiency of lower extremity 01/25/2019   With chronic bilateral lower extremity edema  . Incontinent of urine   . Multiple sclerosis ([place]) 2015  . Osteoporosis    osteopenia  . Polyarthritis   . Post-menopausal   . Rheumatoid arthritis ([place])   . Temporal arteritis ([place]) 2011    PAST SURGICAL HISTORY: Past Surgical History:  Procedure Laterality Date  . ABDOMINAL HYSTERECTOMY  1971   TVH  . breast ductectomy Right 05/1995  . COLONOSCOPY  10/2011    FAMILY HISTORY: Family History  Problem Relation Age of Onset  . Osteoarthritis Father   . Stroke Father   . Heart failure Mother   . Hypertension Mother   . Heart disease Mother   . Multiple sclerosis Cousin   . Multiple sclerosis Cousin   . Multiple sclerosis Cousin     SOCIAL HISTORY:  Social History   Socioeconomic History  . Marital status: Widowed    Spouse name: Not on file  . Number of children: 2  . Years of education: Not on file  . Highest education level: Not on file  Occupational History  . Not on file  Tobacco Use  . Smoking status: Never Smoker  . Smokeless tobacco: Never Used  Substance and Sexual Activity  . Alcohol use: No  . Drug use: No  . Sexual activity: Not Currently    Partners: Male    Birth control/protection: Post-menopausal, Surgical    Comment: hysterectomy  Other Topics Concern  . Not on file  Social History Narrative   Widowed mother of 2, grandmother 82 with 2 great-grandchildren.      Tries to workout at the [facility] several days a week 30 minutes at a time on stairstepper or elliptical.   Social Determinants of Health   Financial Resource Strain: Not on file  Food Insecurity: Not on file  Transportation Needs: Not on file  Physical Activity: Not on file  Stress: Not on file  Social Connections: Not on file  Intimate Partner Violence: Not on file     PHYSICAL EXAM  Vitals:   12/27/20 1041   BP: 118/69  Pulse: 69  Height: 5\' 5"  (1.651 m)    Body mass index is 20.14 kg/m.   General: The patient is well-developed and well-nourished and in no acute distress   Skin: Extremities show severe bilateral pitting pedal edema.   Neurologic Exam  Mental status: The patient is alert and oriented x 3 at the time of the examination. The patient has apparent normal recent and remote memory, with an apparently normal attention span and concentration ability.   Speech is normal.  Cranial nerves: Extraocular movements are full. The facial strength and facial sensation are normal. Trapezius strength is normal.  The tongue is midline, and the patient has symmetric elevation of the soft palate. No obvious hearing deficits are noted.  Motor:  Muscle bulk is normal.   Tone is increased in the legs, left > right.   Strength is 5/5 in the arms (but reduced left RAM) and 5/5 in right leg and proximal left leg and 4/5 distal left leg   Sensory: Sensory testing shows reduced touch and vibration on her right leg relative to the left but reduced right hand vibration and temperature sensations on the left leg worse than left arm.  Reduced sensation to vibration at the toes.  Coordination: Cerebellar testing shows good finger-nose-finger but reduced left heel-to-shin.  Gait and station: Station is normal.   Gait is wide and she has a left foot drop.  She is midly ataxic.  She cannot tandem walk.  Romberg is negative.  Reflexes: Deep tendon reflexes are symmetric and normal bilaterally.   Plantar responses are extensor on her left.    DIAGNOSTIC DATA (LABS, IMAGING, TESTING) - I reviewed patient records, labs, notes, testing and imaging myself where available.  Lab Results  Component Value Date   WBC 4.5 05/30/2020   HGB 13.0 05/30/2020   HCT 39 05/30/2020   MCV 98.5 03/22/2020   PLT 230 05/12/2020      Component Value Date/Time   NA 135 (A) 06/05/2020 0000   K 4.1 06/05/2020 0000   CL 101  06/05/2020 0000   CO2 23 (A) 06/05/2020 0000   GLUCOSE 100 (H) 03/23/2020 0805   BUN 17 06/05/2020 0000   CREATININE 0.5 06/05/2020 0000   CREATININE 0.63 03/23/2020 0805   CREATININE 0.82 02/23/2019 1444   CALCIUM 9.5 06/05/2020 0000   PROT 5.4 (L) 03/23/2020 0805   ALBUMIN 4.0 05/30/2020 0000   AST 27 05/30/2020 0000   AST 27 02/23/2019 1444   ALT 16 05/30/2020 0000   ALT 27 02/23/2019 1444   ALKPHOS 78 05/30/2020 0000   BILITOT 1.0 03/23/2020 0805   BILITOT 0.4 02/23/2019 1444   GFRNONAA >90 06/05/2020 0000   GFRNONAA >60 02/23/2019 1444   GFRAA >90 06/05/2020 0000   GFRAA >60 02/23/2019 1444       ASSESSMENT AND PLAN  Multiple sclerosis, primary progressive (HCC)  Urinary dysfunction  Rheumatoid arthritis, involving unspecified site, unspecified whether rheumatoid factor present ([place])  S/P right hip fracture   1.   Will continue off of a DMT.  She appears to have PPMS though I can't rule out inactive SPMS.  Progression has been very slow and risks of Ocrevus likely outweigh the benefits.   Additionally, she is on methotrexate/Arava for RA.   2.   Stay  active as possible.  Use wheelchair for safety.   Increase activity if advised by orthopedics.   3.  Nabumetone for pain.  She had a reaction to sulfasalazine so we'll avoid sulfur containing NSAIDs like meloxicam and celebrex.  3.   If spasms worsen, consider baclofen 4.   rtc 12 months or sooner if new or worsening neurologic issues.   Can alternate visits with NP.   45-minute office visit with the majority of the time spent face-to-face for history and physical, discussion/counseling and decision-making.  Additional time with record review and documentation.   Corda Shutt A. Felecia Shelling, MD, PhD, Charlynn Grimes   12/27/2020, 2:09 PM Director of the [facility] at [facility] Certified in Neurology, Clinical Neurophysiology, Sleep Medicine, Pain Medicine and [facility] [address], [place], [place] [address] [phone]

## 2020-12-29 ENCOUNTER — Telehealth: Payer: Self-pay | Admitting: Podiatry

## 2020-12-29 ENCOUNTER — Encounter: Payer: Self-pay | Admitting: Podiatry

## 2020-12-29 DIAGNOSIS — L89892 Pressure ulcer of other site, stage 2: Secondary | ICD-10-CM

## 2020-12-29 MED ORDER — IODOSORB 0.9 % EX GEL: 1.0000 "application " | Freq: Every day | CUTANEOUS | 0 refills | Status: DC | PRN

## 2020-12-29 MED ORDER — DOXYCYCLINE HYCLATE 100 MG PO CAPS: 100.0000 mg | ORAL_CAPSULE | Freq: Two times a day (BID) | ORAL | 0 refills | Status: AC

## 2020-12-29 MED ORDER — SODIUM CHLORIDE 0.9 % EX SOLN: CUTANEOUS | 3 refills | Status: DC

## 2020-12-29 NOTE — Telephone Encounter (Signed)
Patient states pressure sore on bottom of left foot has opened up. She will send a picture to MyChart. I will call her back after she sends me the picture, but I would like her to be scheduled with Dr. Sherryle Lis next week.

## 2020-12-29 NOTE — Telephone Encounter (Signed)
Called patient regarding picture of pressure ulcer submet head 5 left foot. Patient states caregiver says there is clear drainage coming from wound. Area does appear erythematous. Informed [name] I will send Rx for doxycycline. Will also send Rx for normal saline to clean wound and Iodosorb Gel as wound care product. She is scheduled to follow up with Dr. Sherryle Lis in our clinic on 01/02/2021.

## 2020-12-29 NOTE — Telephone Encounter (Signed)
Pt states the pressure points on her foot is purple and splitting. She is requesting an Rx for a cream for her caregiver to put on it. Please advise.

## 2020-12-30 DIAGNOSIS — H353132 Nonexudative age-related macular degeneration, bilateral, intermediate dry stage: Secondary | ICD-10-CM | POA: Diagnosis not present

## 2021-01-01 ENCOUNTER — Telehealth: Payer: Self-pay | Admitting: Podiatry

## 2021-01-01 DIAGNOSIS — G35 Multiple sclerosis: Secondary | ICD-10-CM | POA: Diagnosis not present

## 2021-01-01 NOTE — Telephone Encounter (Signed)
She is scheduled for tomorrow.

## 2021-01-02 ENCOUNTER — Ambulatory Visit (INDEPENDENT_AMBULATORY_CARE_PROVIDER_SITE_OTHER): Payer: Medicare Other

## 2021-01-02 ENCOUNTER — Other Ambulatory Visit: Payer: Self-pay

## 2021-01-02 ENCOUNTER — Encounter: Payer: Self-pay | Admitting: Orthopedic Surgery

## 2021-01-02 ENCOUNTER — Ambulatory Visit (INDEPENDENT_AMBULATORY_CARE_PROVIDER_SITE_OTHER): Payer: Medicare Other | Admitting: Podiatry

## 2021-01-02 ENCOUNTER — Ambulatory Visit (INDEPENDENT_AMBULATORY_CARE_PROVIDER_SITE_OTHER): Payer: Medicare Other | Admitting: Orthopedic Surgery

## 2021-01-02 DIAGNOSIS — M0579 Rheumatoid arthritis with rheumatoid factor of multiple sites without organ or systems involvement: Secondary | ICD-10-CM | POA: Diagnosis not present

## 2021-01-02 DIAGNOSIS — S72001G Fracture of unspecified part of neck of right femur, subsequent encounter for closed fracture with delayed healing: Secondary | ICD-10-CM | POA: Diagnosis not present

## 2021-01-02 DIAGNOSIS — M810 Age-related osteoporosis without current pathological fracture: Secondary | ICD-10-CM | POA: Diagnosis not present

## 2021-01-02 DIAGNOSIS — L89892 Pressure ulcer of other site, stage 2: Secondary | ICD-10-CM | POA: Diagnosis not present

## 2021-01-02 DIAGNOSIS — I87323 Chronic venous hypertension (idiopathic) with inflammation of bilateral lower extremity: Secondary | ICD-10-CM | POA: Diagnosis not present

## 2021-01-02 DIAGNOSIS — M25551 Pain in right hip: Secondary | ICD-10-CM

## 2021-01-02 DIAGNOSIS — S32421G Displaced fracture of posterior wall of right acetabulum, subsequent encounter for fracture with delayed healing: Secondary | ICD-10-CM | POA: Diagnosis not present

## 2021-01-02 DIAGNOSIS — L97521 Non-pressure chronic ulcer of other part of left foot limited to breakdown of skin: Secondary | ICD-10-CM | POA: Diagnosis not present

## 2021-01-02 NOTE — Progress Notes (Signed)
Office Visit Note   Patient: [name]           Date of Birth: [birth year]           MRN: [id number] Visit Date: 01/02/2021              Requested by: [name], MD [address] [place],  [place] [address] PCP: [name], MD  Chief Complaint  Patient presents with  . Right Hip - Pain    Hx closed acetabular fx       HPI: Patient is an 87 year old woman who presents in follow-up for multiple medical problems she does have right hip pain she is status post closed acetabular fracture with progressive avascular necrosis of the femoral head.  Patient states she has a chronic history of prednisone use for her MS.  Patient also has a Wagner grade 1 ulcer beneath the fifth metatarsal head of the left foot with supination and varus of the hindfoot she also has venous stasis swelling in both legs.  Patient states that she has had her medicines changed and she is just started baclofen 5 mg 3 times a day.  Assessment & Plan: Visit Diagnoses:  1. Pain in right hip   2. Closed displaced fracture of posterior wall of right acetabulum with delayed healing, subsequent encounter   3. Closed fracture of neck of right femur with delayed healing, subsequent encounter   4. Age-related osteoporosis without current pathological fracture   5. Idiopathic chronic venous hypertension of both lower extremities with inflammation   6. Non-pressure chronic ulcer of other part of left foot limited to breakdown of skin ([place])     Plan: A felt relieving donut was placed over the fifth metatarsal head ulcer recommended that she wear this daily to prevent pressure on the fifth metatarsal head recommended knee-high compression stockings.  Recommended Voltaren gel for the right hip.  I agree the patient will need a motorized wheelchair she has the ability to use a motorized wheelchair but does not have the strength for her current manual wheelchair.  Follow-Up Instructions:  Return if symptoms worsen or fail to improve.   Ortho Exam  Patient is alert, oriented, no adenopathy, well-dressed, normal affect, normal respiratory effort. Examination patient has venous swelling of both legs left worse than right there is no open ulcers no drainage no cellulitis there is dermatitis.  Examination she does have a Wagner grade 1 ulcer beneath the fifth metatarsal head of the left foot secondary to supination and varus of the hindfoot creating increased pressure over the fifth metatarsal head.  She does have an Iodosorb dressing the wound has excellent granulation tissue that is 5 mm in diameter.  Patient is sitting comfortably in her wheelchair does not have hip pain at rest.  Imaging: No results found. No images are attached to the encounter.  Labs: Lab Results  Component Value Date   ESRSEDRATE 108 (H) 04/03/2010   REPTSTATUS 03/24/2020 FINAL 03/23/2020   CULT (A) 03/23/2020    <10,000 COLONIES/mL INSIGNIFICANT GROWTH Performed at [facility] [address], [place], [place] [address]      Lab Results  Component Value Date   ALBUMIN 4.0 05/30/2020   ALBUMIN 4.0 05/03/2020   ALBUMIN 3.7 03/23/2020    No results found for: MG No results found for: VD25OH  No results found for: PREALBUMIN CBC EXTENDED Latest Ref Rng & Units 05/30/2020 05/12/2020 05/11/2020  WBC - 4.5 4.2 4.1  RBC 3.87 - 5.11 3.87 3.95 3.78(A)  HGB 12.0 - 16.0 13.0 13.3 12.6  HCT 36 - 46 39 38 36  PLT 150 - 399 - 230 221  NEUTROABS 1.7 - 7.7 K/uL - - -  LYMPHSABS 0.7 - 4.0 K/uL - - -     There is no height or weight on file to calculate BMI.  Orders:  Orders Placed This Encounter  Procedures  . XR HIP UNILAT W OR W/O PELVIS 2-3 VIEWS RIGHT   No orders of the defined types were placed in this encounter.    Procedures: No procedures performed  Clinical Data: No additional findings.  ROS:  All other systems negative, except as noted in the HPI. Review of  Systems  Objective: Vital Signs: LMP 09/08/1970 (Approximate)   Specialty Comments:  No specialty comments available.  PMFS History: Patient Active Problem List   Diagnosis Date Noted  . Age-related osteoporosis without current pathological fracture 12/13/2020  . Congestive heart failure ([place]) 12/13/2020  . Monoclonal paraproteinemia 12/13/2020  . Multiple sclerosis ([place]) 12/13/2020  . Unspecified abnormal finding in specimens from other organs, systems and tissues 12/13/2020  . Vitamin D deficiency 12/13/2020  . Hyponatremia 04/09/2020  . Acetabular fracture ([place]) 04/01/2020  . Closed right ischial fracture ([place]) 04/01/2020  . Hypertension 04/01/2020  . Hypothyroidism 04/01/2020  . Frequent falls 03/23/2020  . RA (rheumatoid arthritis) ([place]) 03/23/2020  . Tympanic membrane central perforation 12/15/2019  . Cholesteatoma of external auditory canal, right 09/10/2019  . Hearing loss 09/10/2019  . Chronic venous insufficiency of lower extremity 01/25/2019  . Grade II diastolic dysfunction 85/46/2703  . Urinary dysfunction 12/22/2017  . Multiple sclerosis, primary progressive ([place]) 06/19/2017  . Left foot drop 03/17/2017  . Primary chronic progressive multiple sclerosis ([place]) 03/17/2017  . Myelopathy ([place]) 03/17/2017  . Bilateral lower extremity edema 03/17/2017   Past Medical History:  Diagnosis Date  . Chronic venous insufficiency of lower extremity 01/25/2019   With chronic bilateral lower extremity edema  . Incontinent of urine   . Multiple sclerosis ([place]) 2015  . Osteoporosis    osteopenia  . Polyarthritis   . Post-menopausal   . Rheumatoid arthritis ([place])   . Temporal arteritis ([place]) 2011    Family History  Problem Relation Age of Onset  . Osteoarthritis Father   . Stroke Father   . Heart failure Mother   . Hypertension Mother   . Heart disease Mother   . Multiple sclerosis Cousin   . Multiple sclerosis Cousin   . Multiple sclerosis Cousin     Past  Surgical History:  Procedure Laterality Date  . ABDOMINAL HYSTERECTOMY  1971   TVH  . breast ductectomy Right 05/1995  . COLONOSCOPY  10/2011   Social History   Occupational History  . Not on file  Tobacco Use  . Smoking status: Never Smoker  . Smokeless tobacco: Never Used  Substance and Sexual Activity  . Alcohol use: No  . Drug use: No  . Sexual activity: Not Currently    Partners: Male    Birth control/protection: Post-menopausal, Surgical    Comment: hysterectomy

## 2021-01-03 DIAGNOSIS — M130 Polyarthritis, unspecified: Secondary | ICD-10-CM | POA: Diagnosis not present

## 2021-01-03 DIAGNOSIS — M80051G Age-related osteoporosis with current pathological fracture, right femur, subsequent encounter for fracture with delayed healing: Secondary | ICD-10-CM | POA: Diagnosis not present

## 2021-01-03 DIAGNOSIS — G35 Multiple sclerosis: Secondary | ICD-10-CM | POA: Diagnosis not present

## 2021-01-03 DIAGNOSIS — M21372 Foot drop, left foot: Secondary | ICD-10-CM | POA: Diagnosis not present

## 2021-01-03 DIAGNOSIS — M069 Rheumatoid arthritis, unspecified: Secondary | ICD-10-CM | POA: Diagnosis not present

## 2021-01-03 DIAGNOSIS — I2510 Atherosclerotic heart disease of native coronary artery without angina pectoris: Secondary | ICD-10-CM | POA: Diagnosis not present

## 2021-01-04 ENCOUNTER — Telehealth: Payer: Self-pay | Admitting: Family Medicine

## 2021-01-04 ENCOUNTER — Encounter: Payer: Self-pay | Admitting: Podiatry

## 2021-01-04 DIAGNOSIS — I119 Hypertensive heart disease without heart failure: Secondary | ICD-10-CM | POA: Diagnosis not present

## 2021-01-04 DIAGNOSIS — R32 Unspecified urinary incontinence: Secondary | ICD-10-CM | POA: Diagnosis not present

## 2021-01-04 DIAGNOSIS — M8580 Other specified disorders of bone density and structure, unspecified site: Secondary | ICD-10-CM | POA: Diagnosis not present

## 2021-01-04 DIAGNOSIS — G35 Multiple sclerosis: Secondary | ICD-10-CM | POA: Diagnosis not present

## 2021-01-04 DIAGNOSIS — Z79891 Long term (current) use of opiate analgesic: Secondary | ICD-10-CM | POA: Diagnosis not present

## 2021-01-04 DIAGNOSIS — E871 Hypo-osmolality and hyponatremia: Secondary | ICD-10-CM | POA: Diagnosis not present

## 2021-01-04 DIAGNOSIS — H9190 Unspecified hearing loss, unspecified ear: Secondary | ICD-10-CM | POA: Diagnosis not present

## 2021-01-04 DIAGNOSIS — M316 Other giant cell arteritis: Secondary | ICD-10-CM | POA: Diagnosis not present

## 2021-01-04 DIAGNOSIS — Z791 Long term (current) use of non-steroidal anti-inflammatories (NSAID): Secondary | ICD-10-CM | POA: Diagnosis not present

## 2021-01-04 DIAGNOSIS — E034 Atrophy of thyroid (acquired): Secondary | ICD-10-CM | POA: Diagnosis not present

## 2021-01-04 DIAGNOSIS — Z993 Dependence on wheelchair: Secondary | ICD-10-CM | POA: Diagnosis not present

## 2021-01-04 DIAGNOSIS — W19XXXD Unspecified fall, subsequent encounter: Secondary | ICD-10-CM | POA: Diagnosis not present

## 2021-01-04 DIAGNOSIS — Z9181 History of falling: Secondary | ICD-10-CM | POA: Diagnosis not present

## 2021-01-04 DIAGNOSIS — M130 Polyarthritis, unspecified: Secondary | ICD-10-CM | POA: Diagnosis not present

## 2021-01-04 DIAGNOSIS — I2510 Atherosclerotic heart disease of native coronary artery without angina pectoris: Secondary | ICD-10-CM | POA: Diagnosis not present

## 2021-01-04 DIAGNOSIS — G959 Disease of spinal cord, unspecified: Secondary | ICD-10-CM | POA: Diagnosis not present

## 2021-01-04 DIAGNOSIS — M069 Rheumatoid arthritis, unspecified: Secondary | ICD-10-CM | POA: Diagnosis not present

## 2021-01-04 DIAGNOSIS — I872 Venous insufficiency (chronic) (peripheral): Secondary | ICD-10-CM | POA: Diagnosis not present

## 2021-01-04 DIAGNOSIS — M21372 Foot drop, left foot: Secondary | ICD-10-CM | POA: Diagnosis not present

## 2021-01-04 DIAGNOSIS — Z79899 Other long term (current) drug therapy: Secondary | ICD-10-CM | POA: Diagnosis not present

## 2021-01-04 DIAGNOSIS — M80051G Age-related osteoporosis with current pathological fracture, right femur, subsequent encounter for fracture with delayed healing: Secondary | ICD-10-CM | POA: Diagnosis not present

## 2021-01-04 DIAGNOSIS — R600 Localized edema: Secondary | ICD-10-CM | POA: Diagnosis not present

## 2021-01-04 NOTE — Progress Notes (Signed)
  Subjective:  Patient ID: [name], female    DOB: Nov 07, 1932,  MRN: [id number]  Chief Complaint  Patient presents with  . Foot Ulcer    PT stated that she is concerned about the drainage coming from the place.     87 y.o. female presents with the above complaint. History confirmed with patient.  She is referred to me by Dr. Elisha Ponder for a ulcer on the plantar fifth metatarsal head.  She is here with her daughter.  They have been putting Iodosorb ointment which was prescribed for them.  She is taking doxycycline currently  Objective:  Physical Exam: Small full-thickness ulceration on the plantar fifth metatarsal head.  Measures 6 mm x 3 mm x 1 mm.  No signs of acute infection.  Surrounding hyperkeratosis.  There is Iodosorb ointment to surrounding this.  She has venous stasis and chronic venous edema.  Foot is warm well perfused, unable to palpate pulses secondary to edema Assessment:   1. Pressure injury of left foot, stage 2 ([place])   2. Rheumatoid arthritis involving multiple sites with positive rheumatoid factor ([place])      Plan:  Patient was evaluated and treated and all questions answered.  Ulcer left plantar fifth metatarsal head -Complete doxycycline -Dressed with Iodosorb, DSD.  Continue this at home -Continue offloading with felt donut pad which was made for by her orthopedist -Emphasized importance of offloading -Very painful for her today.  At next visit we will try to use a topical anesthetic and then debride if possible -She is able to wear compression stockings and this would help to speed up her wound healing process.  She has significant chronic edema.  She also is respecters of rheumatoid arthritis contributing to this.  She is not currently on biologic therapy which will impair wound healing.  No follow-ups on file.

## 2021-01-04 NOTE — Telephone Encounter (Signed)
Saw Dr. Ferrel Logan, [facility] for second opinion.

## 2021-01-04 NOTE — Telephone Encounter (Signed)
Called and spoke to Patient and she relayed she is already going to [facility] and seeing Dr. Ferrel Logan her first apt was this coming Monday . Patient relayed Thanks for your help. I will CX her apt with [place]

## 2021-01-09 DIAGNOSIS — H35371 Puckering of macula, right eye: Secondary | ICD-10-CM | POA: Diagnosis not present

## 2021-01-09 DIAGNOSIS — H353132 Nonexudative age-related macular degeneration, bilateral, intermediate dry stage: Secondary | ICD-10-CM | POA: Diagnosis not present

## 2021-01-09 DIAGNOSIS — H3540 Unspecified peripheral retinal degeneration: Secondary | ICD-10-CM | POA: Diagnosis not present

## 2021-01-09 DIAGNOSIS — H43813 Vitreous degeneration, bilateral: Secondary | ICD-10-CM | POA: Diagnosis not present

## 2021-01-12 DIAGNOSIS — I2510 Atherosclerotic heart disease of native coronary artery without angina pectoris: Secondary | ICD-10-CM | POA: Diagnosis not present

## 2021-01-12 DIAGNOSIS — M80051G Age-related osteoporosis with current pathological fracture, right femur, subsequent encounter for fracture with delayed healing: Secondary | ICD-10-CM | POA: Diagnosis not present

## 2021-01-12 DIAGNOSIS — M069 Rheumatoid arthritis, unspecified: Secondary | ICD-10-CM | POA: Diagnosis not present

## 2021-01-12 DIAGNOSIS — G35 Multiple sclerosis: Secondary | ICD-10-CM | POA: Diagnosis not present

## 2021-01-12 DIAGNOSIS — M130 Polyarthritis, unspecified: Secondary | ICD-10-CM | POA: Diagnosis not present

## 2021-01-12 DIAGNOSIS — M21372 Foot drop, left foot: Secondary | ICD-10-CM | POA: Diagnosis not present

## 2021-01-19 DIAGNOSIS — M130 Polyarthritis, unspecified: Secondary | ICD-10-CM | POA: Diagnosis not present

## 2021-01-19 DIAGNOSIS — M069 Rheumatoid arthritis, unspecified: Secondary | ICD-10-CM | POA: Diagnosis not present

## 2021-01-19 DIAGNOSIS — M80051G Age-related osteoporosis with current pathological fracture, right femur, subsequent encounter for fracture with delayed healing: Secondary | ICD-10-CM | POA: Diagnosis not present

## 2021-01-19 DIAGNOSIS — G35 Multiple sclerosis: Secondary | ICD-10-CM | POA: Diagnosis not present

## 2021-01-19 DIAGNOSIS — M21372 Foot drop, left foot: Secondary | ICD-10-CM | POA: Diagnosis not present

## 2021-01-19 DIAGNOSIS — I2510 Atherosclerotic heart disease of native coronary artery without angina pectoris: Secondary | ICD-10-CM | POA: Diagnosis not present

## 2021-01-23 ENCOUNTER — Ambulatory Visit (INDEPENDENT_AMBULATORY_CARE_PROVIDER_SITE_OTHER): Payer: Medicare Other | Admitting: Podiatry

## 2021-01-23 ENCOUNTER — Other Ambulatory Visit: Payer: Self-pay

## 2021-01-23 DIAGNOSIS — L89892 Pressure ulcer of other site, stage 2: Secondary | ICD-10-CM | POA: Diagnosis not present

## 2021-01-23 DIAGNOSIS — M0579 Rheumatoid arthritis with rheumatoid factor of multiple sites without organ or systems involvement: Secondary | ICD-10-CM | POA: Diagnosis not present

## 2021-01-23 MED ORDER — MUPIROCIN 2 % EX OINT: 1.0000 "application " | TOPICAL_OINTMENT | Freq: Every day | CUTANEOUS | 2 refills | Status: DC

## 2021-01-23 NOTE — Patient Instructions (Signed)
Discontinue using the iodosorb ointment  Apply mupirocin ointment daily with a padded bandage  Offload the foot when seated or sleeping with a pillow so no hard pressure is over the wound

## 2021-01-25 ENCOUNTER — Encounter: Payer: Self-pay | Admitting: Podiatry

## 2021-01-25 NOTE — Progress Notes (Signed)
  Subjective:  Patient ID: [name], female    DOB: 06-Oct-1932,  MRN: [id number]  Chief Complaint  Patient presents with  . Foot Ulcer    Left foot ulcer. PT stated that it feels a little better.    87 y.o. female returns with the above complaint. History confirmed with patient.  She is doing well.  Objective:  Physical Exam: Ulceration is nearly completely healed.  She has venous stasis and chronic venous edema.  Foot is warm well perfused, unable to palpate pulses secondary to edema Assessment:   1. Pressure injury of left foot, stage 2 ([place])   2. Rheumatoid arthritis involving multiple sites with positive rheumatoid factor ([place])      Plan:  Patient was evaluated and treated and all questions answered.  Ulcer left plantar fifth metatarsal head -No need for further antibiotics at this time -Dressed with Medihoney, foam border dressing.  Switch to mupirocin at home, prescription sent to pharmacy -Continue offloading with felt donut pad which was made for by her orthopedist -Like to see her back in 2 weeks time to ensure that it remains healed  Return in about 2 weeks (around 02/06/2021) for wound re-check.

## 2021-01-27 DIAGNOSIS — G35 Multiple sclerosis: Secondary | ICD-10-CM | POA: Diagnosis not present

## 2021-01-27 DIAGNOSIS — M80051G Age-related osteoporosis with current pathological fracture, right femur, subsequent encounter for fracture with delayed healing: Secondary | ICD-10-CM | POA: Diagnosis not present

## 2021-01-27 DIAGNOSIS — M130 Polyarthritis, unspecified: Secondary | ICD-10-CM | POA: Diagnosis not present

## 2021-01-27 DIAGNOSIS — M21372 Foot drop, left foot: Secondary | ICD-10-CM | POA: Diagnosis not present

## 2021-01-27 DIAGNOSIS — I2510 Atherosclerotic heart disease of native coronary artery without angina pectoris: Secondary | ICD-10-CM | POA: Diagnosis not present

## 2021-01-27 DIAGNOSIS — M069 Rheumatoid arthritis, unspecified: Secondary | ICD-10-CM | POA: Diagnosis not present

## 2021-01-31 DIAGNOSIS — G35 Multiple sclerosis: Secondary | ICD-10-CM | POA: Diagnosis not present

## 2021-01-31 DIAGNOSIS — I2510 Atherosclerotic heart disease of native coronary artery without angina pectoris: Secondary | ICD-10-CM | POA: Diagnosis not present

## 2021-01-31 DIAGNOSIS — M130 Polyarthritis, unspecified: Secondary | ICD-10-CM | POA: Diagnosis not present

## 2021-01-31 DIAGNOSIS — M21372 Foot drop, left foot: Secondary | ICD-10-CM | POA: Diagnosis not present

## 2021-01-31 DIAGNOSIS — M80051G Age-related osteoporosis with current pathological fracture, right femur, subsequent encounter for fracture with delayed healing: Secondary | ICD-10-CM | POA: Diagnosis not present

## 2021-01-31 DIAGNOSIS — M069 Rheumatoid arthritis, unspecified: Secondary | ICD-10-CM | POA: Diagnosis not present

## 2021-02-03 DIAGNOSIS — E871 Hypo-osmolality and hyponatremia: Secondary | ICD-10-CM | POA: Diagnosis not present

## 2021-02-03 DIAGNOSIS — R600 Localized edema: Secondary | ICD-10-CM | POA: Diagnosis not present

## 2021-02-03 DIAGNOSIS — W19XXXD Unspecified fall, subsequent encounter: Secondary | ICD-10-CM | POA: Diagnosis not present

## 2021-02-03 DIAGNOSIS — I872 Venous insufficiency (chronic) (peripheral): Secondary | ICD-10-CM | POA: Diagnosis not present

## 2021-02-03 DIAGNOSIS — I2510 Atherosclerotic heart disease of native coronary artery without angina pectoris: Secondary | ICD-10-CM | POA: Diagnosis not present

## 2021-02-03 DIAGNOSIS — Z993 Dependence on wheelchair: Secondary | ICD-10-CM | POA: Diagnosis not present

## 2021-02-03 DIAGNOSIS — G959 Disease of spinal cord, unspecified: Secondary | ICD-10-CM | POA: Diagnosis not present

## 2021-02-03 DIAGNOSIS — M069 Rheumatoid arthritis, unspecified: Secondary | ICD-10-CM | POA: Diagnosis not present

## 2021-02-03 DIAGNOSIS — M8580 Other specified disorders of bone density and structure, unspecified site: Secondary | ICD-10-CM | POA: Diagnosis not present

## 2021-02-03 DIAGNOSIS — I119 Hypertensive heart disease without heart failure: Secondary | ICD-10-CM | POA: Diagnosis not present

## 2021-02-03 DIAGNOSIS — E034 Atrophy of thyroid (acquired): Secondary | ICD-10-CM | POA: Diagnosis not present

## 2021-02-03 DIAGNOSIS — M316 Other giant cell arteritis: Secondary | ICD-10-CM | POA: Diagnosis not present

## 2021-02-03 DIAGNOSIS — M21372 Foot drop, left foot: Secondary | ICD-10-CM | POA: Diagnosis not present

## 2021-02-03 DIAGNOSIS — H9190 Unspecified hearing loss, unspecified ear: Secondary | ICD-10-CM | POA: Diagnosis not present

## 2021-02-03 DIAGNOSIS — Z79899 Other long term (current) drug therapy: Secondary | ICD-10-CM | POA: Diagnosis not present

## 2021-02-03 DIAGNOSIS — R32 Unspecified urinary incontinence: Secondary | ICD-10-CM | POA: Diagnosis not present

## 2021-02-03 DIAGNOSIS — M130 Polyarthritis, unspecified: Secondary | ICD-10-CM | POA: Diagnosis not present

## 2021-02-03 DIAGNOSIS — M80051G Age-related osteoporosis with current pathological fracture, right femur, subsequent encounter for fracture with delayed healing: Secondary | ICD-10-CM | POA: Diagnosis not present

## 2021-02-03 DIAGNOSIS — G35 Multiple sclerosis: Secondary | ICD-10-CM | POA: Diagnosis not present

## 2021-02-07 DIAGNOSIS — Z79899 Other long term (current) drug therapy: Secondary | ICD-10-CM | POA: Diagnosis not present

## 2021-02-07 DIAGNOSIS — G35 Multiple sclerosis: Secondary | ICD-10-CM | POA: Diagnosis not present

## 2021-02-07 DIAGNOSIS — D472 Monoclonal gammopathy: Secondary | ICD-10-CM | POA: Diagnosis not present

## 2021-02-07 DIAGNOSIS — I509 Heart failure, unspecified: Secondary | ICD-10-CM | POA: Diagnosis not present

## 2021-02-07 DIAGNOSIS — I878 Other specified disorders of veins: Secondary | ICD-10-CM | POA: Diagnosis not present

## 2021-02-07 DIAGNOSIS — M7989 Other specified soft tissue disorders: Secondary | ICD-10-CM | POA: Diagnosis not present

## 2021-02-07 DIAGNOSIS — M810 Age-related osteoporosis without current pathological fracture: Secondary | ICD-10-CM | POA: Diagnosis not present

## 2021-02-07 DIAGNOSIS — L659 Nonscarring hair loss, unspecified: Secondary | ICD-10-CM | POA: Diagnosis not present

## 2021-02-07 DIAGNOSIS — M79643 Pain in unspecified hand: Secondary | ICD-10-CM | POA: Diagnosis not present

## 2021-02-07 DIAGNOSIS — M0579 Rheumatoid arthritis with rheumatoid factor of multiple sites without organ or systems involvement: Secondary | ICD-10-CM | POA: Diagnosis not present

## 2021-02-08 ENCOUNTER — Other Ambulatory Visit: Payer: Self-pay

## 2021-02-08 ENCOUNTER — Ambulatory Visit (INDEPENDENT_AMBULATORY_CARE_PROVIDER_SITE_OTHER): Payer: Medicare Other | Admitting: Podiatry

## 2021-02-08 DIAGNOSIS — L89892 Pressure ulcer of other site, stage 2: Secondary | ICD-10-CM | POA: Diagnosis not present

## 2021-02-11 NOTE — Progress Notes (Signed)
  Subjective:  Patient ID: [name], female    DOB: 1932/04/06,  MRN: [id number]  Chief Complaint  Patient presents with  . Foot Ulcer    Right foot pressure injury, PT stated that her foot is still sore    87 y.o. female returns with the above complaint. History confirmed with patient.  She is doing well.  Wound remains healed  Objective:  Physical Exam: Ulceration is still completely healed.  She has venous stasis and chronic venous edema.  Foot is warm well perfused, unable to palpate pulses secondary to edema Assessment:   1. Pressure injury of left foot, stage 2 ([place])      Plan:  Patient was evaluated and treated and all questions answered.  Ulcer left plantar fifth metatarsal head -Remains healed.  Return to me as needed for further wound care if it recurs  No follow-ups on file.

## 2021-02-13 ENCOUNTER — Telehealth: Payer: Self-pay | Admitting: Orthopedic Surgery

## 2021-02-13 NOTE — Telephone Encounter (Signed)
Noted  

## 2021-02-13 NOTE — Telephone Encounter (Signed)
I called and lm on vm to question what test results the pt is wanting to discuss? To call back and let us know.

## 2021-02-13 NOTE — Telephone Encounter (Signed)
Pt called and states that she would like a phone call to talk about her test results. Please give her a phone call at [phone]

## 2021-02-13 NOTE — Telephone Encounter (Signed)
Patient was returning call to Autumn F. Please call patient at [phone].

## 2021-02-13 NOTE — Telephone Encounter (Signed)
Pt called advised to disregard message.

## 2021-02-13 NOTE — Telephone Encounter (Signed)
Spoke with patient she advised she already have a copy of the X-ray and to disregard message.

## 2021-02-16 DIAGNOSIS — M069 Rheumatoid arthritis, unspecified: Secondary | ICD-10-CM | POA: Diagnosis not present

## 2021-02-16 DIAGNOSIS — M130 Polyarthritis, unspecified: Secondary | ICD-10-CM | POA: Diagnosis not present

## 2021-02-16 DIAGNOSIS — M21372 Foot drop, left foot: Secondary | ICD-10-CM | POA: Diagnosis not present

## 2021-02-16 DIAGNOSIS — M80051G Age-related osteoporosis with current pathological fracture, right femur, subsequent encounter for fracture with delayed healing: Secondary | ICD-10-CM | POA: Diagnosis not present

## 2021-02-16 DIAGNOSIS — G35 Multiple sclerosis: Secondary | ICD-10-CM | POA: Diagnosis not present

## 2021-02-16 DIAGNOSIS — I2510 Atherosclerotic heart disease of native coronary artery without angina pectoris: Secondary | ICD-10-CM | POA: Diagnosis not present

## 2021-02-22 DIAGNOSIS — M810 Age-related osteoporosis without current pathological fracture: Secondary | ICD-10-CM | POA: Diagnosis not present

## 2021-02-23 DIAGNOSIS — G35 Multiple sclerosis: Secondary | ICD-10-CM | POA: Diagnosis not present

## 2021-02-23 DIAGNOSIS — M130 Polyarthritis, unspecified: Secondary | ICD-10-CM | POA: Diagnosis not present

## 2021-02-23 DIAGNOSIS — I2510 Atherosclerotic heart disease of native coronary artery without angina pectoris: Secondary | ICD-10-CM | POA: Diagnosis not present

## 2021-02-23 DIAGNOSIS — M069 Rheumatoid arthritis, unspecified: Secondary | ICD-10-CM | POA: Diagnosis not present

## 2021-02-23 DIAGNOSIS — M21372 Foot drop, left foot: Secondary | ICD-10-CM | POA: Diagnosis not present

## 2021-02-23 DIAGNOSIS — M80051G Age-related osteoporosis with current pathological fracture, right femur, subsequent encounter for fracture with delayed healing: Secondary | ICD-10-CM | POA: Diagnosis not present

## 2021-03-05 DIAGNOSIS — H9190 Unspecified hearing loss, unspecified ear: Secondary | ICD-10-CM | POA: Diagnosis not present

## 2021-03-05 DIAGNOSIS — M80051G Age-related osteoporosis with current pathological fracture, right femur, subsequent encounter for fracture with delayed healing: Secondary | ICD-10-CM | POA: Diagnosis not present

## 2021-03-05 DIAGNOSIS — G35 Multiple sclerosis: Secondary | ICD-10-CM | POA: Diagnosis not present

## 2021-03-05 DIAGNOSIS — R32 Unspecified urinary incontinence: Secondary | ICD-10-CM | POA: Diagnosis not present

## 2021-03-05 DIAGNOSIS — W19XXXD Unspecified fall, subsequent encounter: Secondary | ICD-10-CM | POA: Diagnosis not present

## 2021-03-05 DIAGNOSIS — G959 Disease of spinal cord, unspecified: Secondary | ICD-10-CM | POA: Diagnosis not present

## 2021-03-05 DIAGNOSIS — E871 Hypo-osmolality and hyponatremia: Secondary | ICD-10-CM | POA: Diagnosis not present

## 2021-03-05 DIAGNOSIS — M21372 Foot drop, left foot: Secondary | ICD-10-CM | POA: Diagnosis not present

## 2021-03-05 DIAGNOSIS — M069 Rheumatoid arthritis, unspecified: Secondary | ICD-10-CM | POA: Diagnosis not present

## 2021-03-05 DIAGNOSIS — M130 Polyarthritis, unspecified: Secondary | ICD-10-CM | POA: Diagnosis not present

## 2021-03-05 DIAGNOSIS — M8580 Other specified disorders of bone density and structure, unspecified site: Secondary | ICD-10-CM | POA: Diagnosis not present

## 2021-03-05 DIAGNOSIS — I2510 Atherosclerotic heart disease of native coronary artery without angina pectoris: Secondary | ICD-10-CM | POA: Diagnosis not present

## 2021-03-05 DIAGNOSIS — I119 Hypertensive heart disease without heart failure: Secondary | ICD-10-CM | POA: Diagnosis not present

## 2021-03-05 DIAGNOSIS — Z993 Dependence on wheelchair: Secondary | ICD-10-CM | POA: Diagnosis not present

## 2021-03-05 DIAGNOSIS — E034 Atrophy of thyroid (acquired): Secondary | ICD-10-CM | POA: Diagnosis not present

## 2021-03-05 DIAGNOSIS — I872 Venous insufficiency (chronic) (peripheral): Secondary | ICD-10-CM | POA: Diagnosis not present

## 2021-03-05 DIAGNOSIS — R600 Localized edema: Secondary | ICD-10-CM | POA: Diagnosis not present

## 2021-03-05 DIAGNOSIS — M316 Other giant cell arteritis: Secondary | ICD-10-CM | POA: Diagnosis not present

## 2021-03-05 DIAGNOSIS — Z79899 Other long term (current) drug therapy: Secondary | ICD-10-CM | POA: Diagnosis not present

## 2021-03-07 DIAGNOSIS — E039 Hypothyroidism, unspecified: Secondary | ICD-10-CM | POA: Diagnosis not present

## 2021-03-07 DIAGNOSIS — M069 Rheumatoid arthritis, unspecified: Secondary | ICD-10-CM | POA: Diagnosis not present

## 2021-03-07 DIAGNOSIS — M810 Age-related osteoporosis without current pathological fracture: Secondary | ICD-10-CM | POA: Diagnosis not present

## 2021-03-07 DIAGNOSIS — M130 Polyarthritis, unspecified: Secondary | ICD-10-CM | POA: Diagnosis not present

## 2021-03-07 DIAGNOSIS — E559 Vitamin D deficiency, unspecified: Secondary | ICD-10-CM | POA: Diagnosis not present

## 2021-03-07 DIAGNOSIS — G35 Multiple sclerosis: Secondary | ICD-10-CM | POA: Diagnosis not present

## 2021-03-07 DIAGNOSIS — I2510 Atherosclerotic heart disease of native coronary artery without angina pectoris: Secondary | ICD-10-CM | POA: Diagnosis not present

## 2021-03-07 DIAGNOSIS — M80051G Age-related osteoporosis with current pathological fracture, right femur, subsequent encounter for fracture with delayed healing: Secondary | ICD-10-CM | POA: Diagnosis not present

## 2021-03-07 DIAGNOSIS — M21372 Foot drop, left foot: Secondary | ICD-10-CM | POA: Diagnosis not present

## 2021-03-08 DIAGNOSIS — G35 Multiple sclerosis: Secondary | ICD-10-CM | POA: Diagnosis not present

## 2021-03-08 DIAGNOSIS — W19XXXS Unspecified fall, sequela: Secondary | ICD-10-CM | POA: Diagnosis not present

## 2021-03-08 DIAGNOSIS — E871 Hypo-osmolality and hyponatremia: Secondary | ICD-10-CM | POA: Diagnosis not present

## 2021-03-08 DIAGNOSIS — E559 Vitamin D deficiency, unspecified: Secondary | ICD-10-CM | POA: Diagnosis not present

## 2021-03-08 DIAGNOSIS — S72009A Fracture of unspecified part of neck of unspecified femur, initial encounter for closed fracture: Secondary | ICD-10-CM | POA: Diagnosis not present

## 2021-03-08 DIAGNOSIS — E039 Hypothyroidism, unspecified: Secondary | ICD-10-CM | POA: Diagnosis not present

## 2021-03-08 DIAGNOSIS — M1990 Unspecified osteoarthritis, unspecified site: Secondary | ICD-10-CM | POA: Diagnosis not present

## 2021-03-08 DIAGNOSIS — R54 Age-related physical debility: Secondary | ICD-10-CM | POA: Diagnosis not present

## 2021-03-08 DIAGNOSIS — R262 Difficulty in walking, not elsewhere classified: Secondary | ICD-10-CM | POA: Diagnosis not present

## 2021-03-08 DIAGNOSIS — R609 Edema, unspecified: Secondary | ICD-10-CM | POA: Diagnosis not present

## 2021-03-08 DIAGNOSIS — M810 Age-related osteoporosis without current pathological fracture: Secondary | ICD-10-CM | POA: Diagnosis not present

## 2021-03-14 ENCOUNTER — Ambulatory Visit: Payer: Medicare Other | Admitting: Podiatry

## 2021-03-16 DIAGNOSIS — M21372 Foot drop, left foot: Secondary | ICD-10-CM | POA: Diagnosis not present

## 2021-03-16 DIAGNOSIS — M80051G Age-related osteoporosis with current pathological fracture, right femur, subsequent encounter for fracture with delayed healing: Secondary | ICD-10-CM | POA: Diagnosis not present

## 2021-03-16 DIAGNOSIS — G35 Multiple sclerosis: Secondary | ICD-10-CM | POA: Diagnosis not present

## 2021-03-16 DIAGNOSIS — M069 Rheumatoid arthritis, unspecified: Secondary | ICD-10-CM | POA: Diagnosis not present

## 2021-03-16 DIAGNOSIS — M130 Polyarthritis, unspecified: Secondary | ICD-10-CM | POA: Diagnosis not present

## 2021-03-16 DIAGNOSIS — I2510 Atherosclerotic heart disease of native coronary artery without angina pectoris: Secondary | ICD-10-CM | POA: Diagnosis not present

## 2021-03-22 DIAGNOSIS — M80051G Age-related osteoporosis with current pathological fracture, right femur, subsequent encounter for fracture with delayed healing: Secondary | ICD-10-CM | POA: Diagnosis not present

## 2021-03-22 DIAGNOSIS — M069 Rheumatoid arthritis, unspecified: Secondary | ICD-10-CM | POA: Diagnosis not present

## 2021-03-22 DIAGNOSIS — M130 Polyarthritis, unspecified: Secondary | ICD-10-CM | POA: Diagnosis not present

## 2021-03-22 DIAGNOSIS — M21372 Foot drop, left foot: Secondary | ICD-10-CM | POA: Diagnosis not present

## 2021-03-22 DIAGNOSIS — I2510 Atherosclerotic heart disease of native coronary artery without angina pectoris: Secondary | ICD-10-CM | POA: Diagnosis not present

## 2021-03-22 DIAGNOSIS — G35 Multiple sclerosis: Secondary | ICD-10-CM | POA: Diagnosis not present

## 2021-03-29 DIAGNOSIS — Z1231 Encounter for screening mammogram for malignant neoplasm of breast: Secondary | ICD-10-CM | POA: Diagnosis not present

## 2021-04-13 ENCOUNTER — Other Ambulatory Visit: Payer: Self-pay | Admitting: Student

## 2021-04-13 ENCOUNTER — Other Ambulatory Visit: Payer: Self-pay

## 2021-04-13 ENCOUNTER — Ambulatory Visit (INDEPENDENT_AMBULATORY_CARE_PROVIDER_SITE_OTHER): Payer: Medicare Other | Admitting: Podiatry

## 2021-04-13 DIAGNOSIS — M0579 Rheumatoid arthritis with rheumatoid factor of multiple sites without organ or systems involvement: Secondary | ICD-10-CM

## 2021-04-13 DIAGNOSIS — M79674 Pain in right toe(s): Secondary | ICD-10-CM | POA: Diagnosis not present

## 2021-04-13 DIAGNOSIS — R262 Difficulty in walking, not elsewhere classified: Secondary | ICD-10-CM | POA: Insufficient documentation

## 2021-04-13 DIAGNOSIS — L89892 Pressure ulcer of other site, stage 2: Secondary | ICD-10-CM

## 2021-04-13 DIAGNOSIS — M1990 Unspecified osteoarthritis, unspecified site: Secondary | ICD-10-CM | POA: Insufficient documentation

## 2021-04-13 DIAGNOSIS — I2510 Atherosclerotic heart disease of native coronary artery without angina pectoris: Secondary | ICD-10-CM | POA: Insufficient documentation

## 2021-04-13 DIAGNOSIS — E46 Unspecified protein-calorie malnutrition: Secondary | ICD-10-CM | POA: Insufficient documentation

## 2021-04-13 DIAGNOSIS — G35 Multiple sclerosis: Secondary | ICD-10-CM

## 2021-04-13 DIAGNOSIS — M79675 Pain in left toe(s): Secondary | ICD-10-CM | POA: Diagnosis not present

## 2021-04-13 DIAGNOSIS — M316 Other giant cell arteritis: Secondary | ICD-10-CM | POA: Insufficient documentation

## 2021-04-13 DIAGNOSIS — I119 Hypertensive heart disease without heart failure: Secondary | ICD-10-CM | POA: Insufficient documentation

## 2021-04-13 DIAGNOSIS — E7800 Pure hypercholesterolemia, unspecified: Secondary | ICD-10-CM | POA: Insufficient documentation

## 2021-04-13 DIAGNOSIS — R54 Age-related physical debility: Secondary | ICD-10-CM | POA: Insufficient documentation

## 2021-04-13 DIAGNOSIS — B351 Tinea unguium: Secondary | ICD-10-CM | POA: Diagnosis not present

## 2021-04-13 DIAGNOSIS — R609 Edema, unspecified: Secondary | ICD-10-CM | POA: Insufficient documentation

## 2021-04-20 ENCOUNTER — Ambulatory Visit
Admission: RE | Admit: 2021-04-20 | Discharge: 2021-04-20 | Disposition: A | Discharge status: Home / Self Care | Payer: Medicare Other | Source: Ambulatory Visit | Attending: Student | Admitting: Student

## 2021-04-20 DIAGNOSIS — R262 Difficulty in walking, not elsewhere classified: Secondary | ICD-10-CM | POA: Diagnosis not present

## 2021-04-20 DIAGNOSIS — G35 Multiple sclerosis: Secondary | ICD-10-CM

## 2021-04-21 ENCOUNTER — Encounter: Payer: Self-pay | Admitting: Podiatry

## 2021-04-21 NOTE — Progress Notes (Signed)
  Subjective:  Patient ID: [name], female    DOB: 04/03/1932,  MRN: [id number]  [name] presents to clinic today for follow up pressure sore plantar aspect left foot and painful thick toenails that are difficult to trim. Pain interferes with ambulation. Aggravating factors include wearing enclosed shoe gear. Pain is relieved with periodic professional debridement.   She is accompanied by her caregiver. She wants to be sure her pressure sore has healed on the bottom of her left foot.   Allergies  Allergen Reactions  . Sulfasalazine Anaphylaxis  . Aspirin Nausea Only  . Penicillins Diarrhea  . Sulfa Antibiotics   . Sulfamethoxazole-Trimethoprim Other (See Comments)    Review of Systems: Negative except as noted in the HPI. Objective:   Constitutional [name] is a pleasant 87 y.o. Caucasian female, in NAD. AAO x 3.   Vascular Capillary refill time to digits immediate b/l. Palpable DP pulse(s) left>right Faintly palpable PT pulse(s) b/l lower extremities. Pedal hair absent. Lower extremity skin temperature gradient within normal limits. Dependent rubor noted b/l lower extremities. +1 pitting edema b/l lower extremities. No cyanosis or clubbing noted.  Neurologic Normal speech. Oriented to person, place, and time. Protective sensation intact 5/5 intact bilaterally with 10g monofilament b/l.  Dermatologic Pedal skin with normal turgor, texture and tone bilaterally. No open wounds bilaterally. No interdigital macerations bilaterally. Toenails 1-5 b/l elongated, discolored, dystrophic, thickened, crumbly with subungual debris and tenderness to dorsal palpation.  Orthopedic: Normal muscle strength 5/5 to all lower extremity muscle groups bilaterally. No pain crepitus or joint limitation noted with ROM b/l. Hammertoes noted to the 1-5 bilaterally. Utilizes wheelchair for mobility assistance.   Radiographs: None Assessment:   1. Pain due to onychomycosis  of toenails of both feet   2. Pressure injury of left foot, stage 2 ([place])   3. Rheumatoid arthritis involving multiple sites with positive rheumatoid factor ([place])   4. Multiple sclerosis ([place])    Plan:  Patient was evaluated and treated and all questions answered.  Onychomycosis with pain -Nails palliatively debridement as below -Educated on self-care  Procedure: Nail Debridement Rationale: Pain Type of Debridement: manual, sharp debridement. Instrumentation: Nail nipper, rotary burr. Number of Nails: 10 -Examined patient. -Patient to continue soft, supportive shoe gear daily. -Toenails 1-5 b/l were debrided in length and girth with sterile nail nippers and dremel without iatrogenic bleeding.  -Patient to report any pedal injuries to medical professional immediately. -Patient's pressure sore has completely resolve. We did discuss pressure precautions to prevent recurrence. -Patient/POA to call should there be question/concern in the interim.  Return if symptoms worsen or fail to improve.  Marzetta Board, DPM

## 2021-05-01 DIAGNOSIS — H6041 Cholesteatoma of right external ear: Secondary | ICD-10-CM | POA: Diagnosis not present

## 2021-05-01 DIAGNOSIS — H6123 Impacted cerumen, bilateral: Secondary | ICD-10-CM | POA: Diagnosis not present

## 2021-05-10 DIAGNOSIS — M19042 Primary osteoarthritis, left hand: Secondary | ICD-10-CM | POA: Diagnosis not present

## 2021-05-10 DIAGNOSIS — M19041 Primary osteoarthritis, right hand: Secondary | ICD-10-CM | POA: Diagnosis not present

## 2021-05-10 DIAGNOSIS — G35 Multiple sclerosis: Secondary | ICD-10-CM | POA: Diagnosis not present

## 2021-05-18 DIAGNOSIS — H353132 Nonexudative age-related macular degeneration, bilateral, intermediate dry stage: Secondary | ICD-10-CM | POA: Diagnosis not present

## 2021-05-18 DIAGNOSIS — H43813 Vitreous degeneration, bilateral: Secondary | ICD-10-CM | POA: Diagnosis not present

## 2021-05-18 DIAGNOSIS — H3540 Unspecified peripheral retinal degeneration: Secondary | ICD-10-CM | POA: Diagnosis not present

## 2021-06-07 ENCOUNTER — Emergency Department (HOSPITAL_COMMUNITY): Payer: Medicare Other

## 2021-06-07 ENCOUNTER — Other Ambulatory Visit: Payer: Self-pay

## 2021-06-07 ENCOUNTER — Inpatient Hospital Stay (HOSPITAL_COMMUNITY)
Admission: EM | Admit: 2021-06-07 | Discharge: 2021-06-13 | DRG: 065 | Disposition: A | Discharge status: Home Health | Payer: Medicare Other | Attending: Internal Medicine | Admitting: Internal Medicine

## 2021-06-07 ENCOUNTER — Observation Stay (HOSPITAL_COMMUNITY): Payer: Medicare Other

## 2021-06-07 DIAGNOSIS — H9190 Unspecified hearing loss, unspecified ear: Secondary | ICD-10-CM | POA: Diagnosis present

## 2021-06-07 DIAGNOSIS — R270 Ataxia, unspecified: Secondary | ICD-10-CM | POA: Diagnosis present

## 2021-06-07 DIAGNOSIS — Z79890 Hormone replacement therapy (postmenopausal): Secondary | ICD-10-CM

## 2021-06-07 DIAGNOSIS — G35 Multiple sclerosis: Secondary | ICD-10-CM | POA: Diagnosis present

## 2021-06-07 DIAGNOSIS — Z20822 Contact with and (suspected) exposure to covid-19: Secondary | ICD-10-CM | POA: Diagnosis present

## 2021-06-07 DIAGNOSIS — R29706 NIHSS score 6: Secondary | ICD-10-CM | POA: Diagnosis not present

## 2021-06-07 DIAGNOSIS — R29818 Other symptoms and signs involving the nervous system: Secondary | ICD-10-CM | POA: Diagnosis not present

## 2021-06-07 DIAGNOSIS — E034 Atrophy of thyroid (acquired): Secondary | ICD-10-CM | POA: Diagnosis not present

## 2021-06-07 DIAGNOSIS — R609 Edema, unspecified: Secondary | ICD-10-CM | POA: Diagnosis present

## 2021-06-07 DIAGNOSIS — R531 Weakness: Secondary | ICD-10-CM | POA: Diagnosis not present

## 2021-06-07 DIAGNOSIS — Z66 Do not resuscitate: Secondary | ICD-10-CM | POA: Diagnosis not present

## 2021-06-07 DIAGNOSIS — Z8673 Personal history of transient ischemic attack (TIA), and cerebral infarction without residual deficits: Secondary | ICD-10-CM

## 2021-06-07 DIAGNOSIS — S32401S Unspecified fracture of right acetabulum, sequela: Secondary | ICD-10-CM | POA: Diagnosis not present

## 2021-06-07 DIAGNOSIS — M130 Polyarthritis, unspecified: Secondary | ICD-10-CM | POA: Diagnosis present

## 2021-06-07 DIAGNOSIS — Z8249 Family history of ischemic heart disease and other diseases of the circulatory system: Secondary | ICD-10-CM

## 2021-06-07 DIAGNOSIS — R4781 Slurred speech: Secondary | ICD-10-CM | POA: Diagnosis present

## 2021-06-07 DIAGNOSIS — Z79899 Other long term (current) drug therapy: Secondary | ICD-10-CM

## 2021-06-07 DIAGNOSIS — E785 Hyperlipidemia, unspecified: Secondary | ICD-10-CM | POA: Diagnosis present

## 2021-06-07 DIAGNOSIS — G8194 Hemiplegia, unspecified affecting left nondominant side: Secondary | ICD-10-CM | POA: Diagnosis not present

## 2021-06-07 DIAGNOSIS — Z9181 History of falling: Secondary | ICD-10-CM

## 2021-06-07 DIAGNOSIS — K5900 Constipation, unspecified: Secondary | ICD-10-CM | POA: Diagnosis present

## 2021-06-07 DIAGNOSIS — R1310 Dysphagia, unspecified: Secondary | ICD-10-CM | POA: Diagnosis present

## 2021-06-07 DIAGNOSIS — M810 Age-related osteoporosis without current pathological fracture: Secondary | ICD-10-CM | POA: Diagnosis present

## 2021-06-07 DIAGNOSIS — I63311 Cerebral infarction due to thrombosis of right middle cerebral artery: Secondary | ICD-10-CM

## 2021-06-07 DIAGNOSIS — I5030 Unspecified diastolic (congestive) heart failure: Secondary | ICD-10-CM | POA: Diagnosis not present

## 2021-06-07 DIAGNOSIS — R29810 Facial weakness: Secondary | ICD-10-CM | POA: Diagnosis present

## 2021-06-07 DIAGNOSIS — I639 Cerebral infarction, unspecified: Secondary | ICD-10-CM | POA: Diagnosis not present

## 2021-06-07 DIAGNOSIS — I083 Combined rheumatic disorders of mitral, aortic and tricuspid valves: Secondary | ICD-10-CM | POA: Diagnosis present

## 2021-06-07 DIAGNOSIS — Z90710 Acquired absence of both cervix and uterus: Secondary | ICD-10-CM

## 2021-06-07 DIAGNOSIS — R29713 NIHSS score 13: Secondary | ICD-10-CM | POA: Diagnosis not present

## 2021-06-07 DIAGNOSIS — Z993 Dependence on wheelchair: Secondary | ICD-10-CM

## 2021-06-07 DIAGNOSIS — M879 Osteonecrosis, unspecified: Secondary | ICD-10-CM | POA: Diagnosis present

## 2021-06-07 DIAGNOSIS — Z880 Allergy status to penicillin: Secondary | ICD-10-CM

## 2021-06-07 DIAGNOSIS — I6381 Other cerebral infarction due to occlusion or stenosis of small artery: Secondary | ICD-10-CM

## 2021-06-07 DIAGNOSIS — Z886 Allergy status to analgesic agent status: Secondary | ICD-10-CM

## 2021-06-07 DIAGNOSIS — I509 Heart failure, unspecified: Secondary | ICD-10-CM

## 2021-06-07 DIAGNOSIS — Z882 Allergy status to sulfonamides status: Secondary | ICD-10-CM

## 2021-06-07 DIAGNOSIS — Z888 Allergy status to other drugs, medicaments and biological substances status: Secondary | ICD-10-CM

## 2021-06-07 DIAGNOSIS — I63541 Cerebral infarction due to unspecified occlusion or stenosis of right cerebellar artery: Secondary | ICD-10-CM | POA: Diagnosis not present

## 2021-06-07 DIAGNOSIS — Z820 Family history of epilepsy and other diseases of the nervous system: Secondary | ICD-10-CM

## 2021-06-07 DIAGNOSIS — I5032 Chronic diastolic (congestive) heart failure: Secondary | ICD-10-CM | POA: Diagnosis present

## 2021-06-07 DIAGNOSIS — M21372 Foot drop, left foot: Secondary | ICD-10-CM | POA: Diagnosis present

## 2021-06-07 DIAGNOSIS — R29707 NIHSS score 7: Secondary | ICD-10-CM | POA: Diagnosis not present

## 2021-06-07 DIAGNOSIS — I10 Essential (primary) hypertension: Secondary | ICD-10-CM | POA: Diagnosis present

## 2021-06-07 DIAGNOSIS — S32409A Unspecified fracture of unspecified acetabulum, initial encounter for closed fracture: Secondary | ICD-10-CM | POA: Diagnosis present

## 2021-06-07 DIAGNOSIS — R29704 NIHSS score 4: Secondary | ICD-10-CM | POA: Diagnosis not present

## 2021-06-07 DIAGNOSIS — R9431 Abnormal electrocardiogram [ECG] [EKG]: Secondary | ICD-10-CM | POA: Diagnosis not present

## 2021-06-07 DIAGNOSIS — I872 Venous insufficiency (chronic) (peripheral): Secondary | ICD-10-CM | POA: Diagnosis present

## 2021-06-07 DIAGNOSIS — Z823 Family history of stroke: Secondary | ICD-10-CM

## 2021-06-07 DIAGNOSIS — I110 Hypertensive heart disease with heart failure: Secondary | ICD-10-CM | POA: Diagnosis present

## 2021-06-07 DIAGNOSIS — R29712 NIHSS score 12: Secondary | ICD-10-CM | POA: Diagnosis not present

## 2021-06-07 DIAGNOSIS — I2510 Atherosclerotic heart disease of native coronary artery without angina pectoris: Secondary | ICD-10-CM | POA: Diagnosis present

## 2021-06-07 DIAGNOSIS — E039 Hypothyroidism, unspecified: Secondary | ICD-10-CM | POA: Diagnosis present

## 2021-06-07 LAB — DIFFERENTIAL
Abs Immature Granulocytes: 0.01 10*3/uL (ref 0.00–0.07)
Basophils Absolute: 0 10*3/uL (ref 0.0–0.1)
Basophils Relative: 1 %
Eosinophils Absolute: 0.2 10*3/uL (ref 0.0–0.5)
Eosinophils Relative: 3 %
Immature Granulocytes: 0 %
Lymphocytes Relative: 44 %
Lymphs Abs: 2.8 10*3/uL (ref 0.7–4.0)
Monocytes Absolute: 0.5 10*3/uL (ref 0.1–1.0)
Monocytes Relative: 7 %
Neutro Abs: 2.9 10*3/uL (ref 1.7–7.7)
Neutrophils Relative %: 45 %

## 2021-06-07 LAB — CBC
HCT: 41.9 % (ref 36.0–46.0)
Hemoglobin: 14.3 g/dL (ref 12.0–15.0)
MCH: 31.8 pg (ref 26.0–34.0)
MCHC: 34.1 g/dL (ref 30.0–36.0)
MCV: 93.1 fL (ref 80.0–100.0)
Platelets: 201 10*3/uL (ref 150–400)
RBC: 4.5 MIL/uL (ref 3.87–5.11)
RDW: 12.8 % (ref 11.5–15.5)
WBC: 6.3 10*3/uL (ref 4.0–10.5)
nRBC: 0 % (ref 0.0–0.2)

## 2021-06-07 LAB — COMPREHENSIVE METABOLIC PANEL
ALT: 19 U/L (ref 0–44)
AST: 35 U/L (ref 15–41)
Albumin: 3.9 g/dL (ref 3.5–5.0)
Alkaline Phosphatase: 67 U/L (ref 38–126)
Anion gap: 12 (ref 5–15)
BUN: 15 mg/dL (ref 8–23)
CO2: 22 mmol/L (ref 22–32)
Calcium: 9.2 mg/dL (ref 8.9–10.3)
Chloride: 99 mmol/L (ref 98–111)
Creatinine, Ser: 0.68 mg/dL (ref 0.44–1.00)
GFR, Estimated: >60 mL/min (ref >60)
Glucose, Bld: 99 mg/dL (ref 70–99)
Potassium: 4.3 mmol/L (ref 3.5–5.1)
Sodium: 133 mmol/L — ABNORMAL LOW (ref 135–145)
Total Bilirubin: 0.7 mg/dL (ref 0.3–1.2)
Total Protein: 6.2 g/dL — ABNORMAL LOW (ref 6.5–8.1)

## 2021-06-07 LAB — I-STAT CHEM 8, ED
BUN: 19 mg/dL (ref 8–23)
Calcium, Ion: 1.11 mmol/L — ABNORMAL LOW (ref 1.15–1.40)
Chloride: 99 mmol/L (ref 98–111)
Creatinine, Ser: 0.6 mg/dL (ref 0.44–1.00)
Glucose, Bld: 104 mg/dL — ABNORMAL HIGH (ref 70–99)
HCT: 42 % (ref 36.0–46.0)
Hemoglobin: 14.3 g/dL (ref 12.0–15.0)
Potassium: 4.2 mmol/L (ref 3.5–5.1)
Sodium: 134 mmol/L — ABNORMAL LOW (ref 135–145)
TCO2: 27 mmol/L (ref 22–32)

## 2021-06-07 LAB — RESP PANEL BY RT-PCR (FLU A&B, COVID) ARPGX2
Influenza A by PCR: NEGATIVE
Influenza B by PCR: NEGATIVE
SARS Coronavirus 2 by RT PCR: NEGATIVE

## 2021-06-07 LAB — APTT: aPTT: 26 seconds (ref 24–36)

## 2021-06-07 LAB — PROTIME-INR
INR: 1 (ref 0.8–1.2)
Prothrombin Time: 12.9 seconds (ref 11.4–15.2)

## 2021-06-07 LAB — CBG MONITORING, ED: Glucose-Capillary: 99 mg/dL (ref 70–99)

## 2021-06-07 MED ORDER — ACETAMINOPHEN 160 MG/5ML PO SOLN: 650.0000 mg | ORAL | Status: DC | PRN

## 2021-06-07 MED ORDER — POLYVINYL ALCOHOL 1.4 % OP SOLN
1.0000 [drp] | Freq: Three times a day (TID) | OPHTHALMIC | Status: DC | PRN
  Filled 2021-06-07: qty 15

## 2021-06-07 MED ORDER — ATORVASTATIN CALCIUM 80 MG PO TABS
80.0000 mg | ORAL_TABLET | Freq: Every day | ORAL | Status: DC
  Administered 2021-06-09 – 2021-06-12 (×4): 80 mg via ORAL
  Filled 2021-06-07 (×6): qty 1

## 2021-06-07 MED ORDER — DICLOFENAC SODIUM 1 % EX GEL: 2.0000 g | Freq: Four times a day (QID) | CUTANEOUS | Status: DC | PRN

## 2021-06-07 MED ORDER — SODIUM CHLORIDE 1 G PO TABS: 1.0000 g | ORAL_TABLET | Freq: Three times a day (TID) | ORAL | Status: DC

## 2021-06-07 MED ORDER — GADOBUTROL 1 MMOL/ML IV SOLN
5.0000 mL | Freq: Once | INTRAVENOUS | Status: AC | PRN
  Administered 2021-06-07: 5 mL via INTRAVENOUS

## 2021-06-07 MED ORDER — ACETAMINOPHEN 650 MG RE SUPP: 650.0000 mg | RECTAL | Status: DC | PRN

## 2021-06-07 MED ORDER — IOHEXOL 350 MG/ML SOLN
75.0000 mL | Freq: Once | INTRAVENOUS | Status: AC | PRN
  Administered 2021-06-07: 75 mL via INTRAVENOUS

## 2021-06-07 MED ORDER — SODIUM CHLORIDE 0.9 % IV SOLN: INTRAVENOUS | Status: AC

## 2021-06-07 MED ORDER — LEVOTHYROXINE SODIUM 25 MCG PO TABS
25.0000 ug | ORAL_TABLET | Freq: Every day | ORAL | Status: DC
  Administered 2021-06-08 – 2021-06-12 (×5): 25 ug via ORAL
  Filled 2021-06-07 (×5): qty 1

## 2021-06-07 MED ORDER — NABUMETONE 500 MG PO TABS: 500.0000 mg | ORAL_TABLET | Freq: Two times a day (BID) | ORAL | Status: DC | PRN

## 2021-06-07 MED ORDER — SENNOSIDES-DOCUSATE SODIUM 8.6-50 MG PO TABS
1.0000 | ORAL_TABLET | Freq: Every evening | ORAL | Status: DC | PRN
  Administered 2021-06-09: 1 via ORAL
  Filled 2021-06-07: qty 1

## 2021-06-07 MED ORDER — METHOCARBAMOL 500 MG PO TABS
500.0000 mg | ORAL_TABLET | Freq: Three times a day (TID) | ORAL | Status: DC
  Administered 2021-06-08 – 2021-06-12 (×14): 500 mg via ORAL
  Filled 2021-06-07 (×15): qty 1

## 2021-06-07 MED ORDER — CARBOXYMETHYLCELLUL-GLYCERIN 0.5-0.9 % OP SOLN: 2.0000 [drp] | Freq: Three times a day (TID) | OPHTHALMIC | Status: DC | PRN

## 2021-06-07 MED ORDER — ENOXAPARIN SODIUM 40 MG/0.4ML IJ SOSY
40.0000 mg | PREFILLED_SYRINGE | INTRAMUSCULAR | Status: DC
  Administered 2021-06-07 – 2021-06-11 (×5): 40 mg via SUBCUTANEOUS
  Filled 2021-06-07 (×5): qty 0.4

## 2021-06-07 MED ORDER — ACETAMINOPHEN 325 MG PO TABS
650.0000 mg | ORAL_TABLET | ORAL | Status: DC | PRN
  Administered 2021-06-07 – 2021-06-12 (×3): 650 mg via ORAL
  Filled 2021-06-07 (×3): qty 2

## 2021-06-07 MED ORDER — BACLOFEN 10 MG PO TABS
10.0000 mg | ORAL_TABLET | Freq: Three times a day (TID) | ORAL | Status: DC
  Administered 2021-06-07 – 2021-06-12 (×15): 10 mg via ORAL
  Filled 2021-06-07 (×17): qty 1

## 2021-06-07 MED ORDER — ASPIRIN EC 81 MG PO TBEC
81.0000 mg | DELAYED_RELEASE_TABLET | Freq: Every day | ORAL | Status: DC
  Administered 2021-06-09 – 2021-06-12 (×4): 81 mg via ORAL
  Filled 2021-06-07 (×5): qty 1

## 2021-06-07 MED ORDER — STROKE: EARLY STAGES OF RECOVERY BOOK: Freq: Once | Status: DC

## 2021-06-07 NOTE — ED Notes (Signed)
Patient transported to CT 

## 2021-06-07 NOTE — Consult Note (Signed)
Neurology Consultation Reason for Consult: Acute stroke on MRI Requesting Physician: Neva Seat  CC: Left sided weakness and dysphagia   History is obtained from: Patient and chart review  HPI: [name] is a 87 y.o. female with a past medical history significant for multiple autoimmune disorders (late onset primary progressive multiple sclerosis, prior diagnosis of seronegative rheumatoid arthritis now felt to be osteoarthritis, temporal arteritis (2010, biopsy-proven, treated with steroids without recurrence)), hypothyroidism, hypertension, hyperlipidemia, coronary artery disease, congestive heart failure (EF 12/2018 60 to 65% with grade 2 diastolic dysfunction), venous insufficiency with chronic edema, osteoporosis  She was in her usual state of health on 6/29 when she had sudden onset left-sided weakness at about 1:15 PM.  However this mostly resolved and she was able to cook her self dinner in the evening and eat it normally.  Unfortunately when she awoke on the morning of 630 she had significant left-sided weakness.  Regarding her multiple sclerosis, she follows with Dr. Felecia Shelling, whom she last saw on 12/27/2020.  At that time her examination was notable for normal mental status; normal cranial nerves; increased tone in the legs left greater than right, reduced range of motion in the left arm but otherwise strength 5/5 except 4/5 in the distal left leg; some reduced vibration/touch sensation in her extremities; impaired left heel-to-shin testing; wide-based gait with mild ataxia and left foot drop, unable to tandem but with negative Romberg; normal reflexes other than Babinski on the left foot. She also saw Dr. Ferrel Logan at [facility] on 05/11/2019 for second opinion, who has agreed that the risks of Orencia outweigh the benefits in this patient and agrees that the clinical history is most consistent with late onset primary progressive multiple sclerosis.  Regarding her history of possible  seronegative rheumatoid arthritis she was initially on methotrexate and leflunomide, then switched to sarilumab which was then discontinued due to symptom freedom.  Reportedly her initial symptoms were swelling in her legs and she had significant side effects of oral ulcers and hair thinning from methotrexate. Per Dr. [place] Desanctis, whom she saw for second opinion, last visit on 05/10/2021, she has done well off of rheumatoid arthritis medications for the past 2 to 3 months and she does not currently have any manifestations of rheumatoid arthritis  Regarding her hip fracture, per Dr. Garth Bigness notes this occurred in April 2021 secondary to a fall.  Due to closed right acetabular fracture with superimposed humeral head fracture she was told she could not get hip replacement, and she has been unable to bear any weight on the right leg since.  She also has mild right hand weakness but can transfer to toilet without assistance.    At baseline she is wheelchair-bound secondary to her unrepairable right-sided hip fracture.  She does have an aide who helps her in her home 4 hours/day, 2 hours in the morning to help her bathe and dress in 2 hours in the evening to help her undress and get ready for bed.  However she performs her own cooking and orders her own groceries which are picked up by her aide.  She does not answer my questions on whether she pays her own bills.  LKW: 9 PM on 06/06/2021 tPA given?: No, due to out of the window on arrival Premorbid modified rankin scale: 3-4     3 - Moderate disability. Requires some help, but able to walk unassisted.     4 - Moderately severe disability. Unable to attend to own bodily  needs without assistance, and unable to walk unassisted.  ROS: All other review of systems was negative except as noted in the HPI.   Past Medical History:  Diagnosis Date   Chronic venous insufficiency of lower extremity 01/25/2019   With chronic bilateral lower extremity edema    Incontinent of urine    Multiple sclerosis ([place]) 2015   Osteoporosis    osteopenia   Polyarthritis    Post-menopausal    Rheumatoid arthritis ([place])    Temporal arteritis ([place]) 2011   Past Surgical History:  Procedure Laterality Date   ABDOMINAL HYSTERECTOMY  1971   TVH   breast ductectomy Right 05/1995   COLONOSCOPY  10/2011   Current Outpatient Medications  Medication Instructions   baclofen (LIORESAL) 10 mg, Oral, 3 times daily   Biotin 1,000 mcg, Oral, Daily   Calcium Carb-Cholecalciferol (CALCIUM-VITAMIN D3) 250-125 MG-UNIT TABS 1 tablet, Oral, 2 times daily   Calcium Carbonate-Vitamin D 600-200 MG-UNIT CAPS 1 capsule, Oral, Daily   Carboxymethylcellul-Glycerin (LUBRICATING EYE DROPS OP) 1 drop, Both Eyes, 2 times daily, For Eye Dryness    diclofenac Sodium (VOLTAREN) 1 % GEL APPLY 2 GMS TOPICALLY TO AFFECTED AREAS (RIGHT HIP) TWICE DAILY AS NEEDED FOR PAIN    levothyroxine (SYNTHROID) 25 mcg, Oral, Daily   Magnesium 250 MG TABS 1 tablet, Oral, Daily   Multiple Vitamins-Minerals (ICAPS AREDS FORMULA PO) 1 capsule, Oral, Daily   nabumetone (RELAFEN) 500 mg, Oral, 2 times daily PRN   Vitamin D (Ergocalciferol) (DRISDOL) 50,000 Units, Oral, Every 7 days   vitamin E 100 Units, Oral, Daily    Family History  Problem Relation Age of Onset   Osteoarthritis Father    Stroke Father    Heart failure Mother    Hypertension Mother    Heart disease Mother    Multiple sclerosis Cousin    Multiple sclerosis Cousin    Multiple sclerosis Cousin    Social History:  reports that she has never smoked. She has never used smokeless tobacco. She reports that she does not drink alcohol and does not use drugs.   Exam: Current vital signs: BP (!) 118/51   Pulse (!) 58   Temp (!) 97.3 F (36.3 C) (Oral)   Resp 14   Ht 5\' 5"  (1.651 m)   Wt 54.4 kg   LMP 09/08/1970 (Approximate)   SpO2 96%   BMI 19.97 kg/m  Vital signs in last 24 hours: Temp:  [97.3 F (36.3 C)-97.7 F (36.5  C)] 97.3 F (36.3 C) (06/30 1656) Pulse Rate:  [55-73] 58 (06/30 2000) Resp:  [11-17] 14 (06/30 2000) BP: (108-170)/(51-99) 118/51 (06/30 2000) SpO2:  [95 %-100 %] 96 % (06/30 2000) Weight:  [54.4 kg] 54.4 kg (06/30 1130)   Physical Exam  Constitutional: Appears well-developed and well-nourished.  Psych: Affect appropriate to situation, tangential in her answers but calm and cooperative Eyes: No scleral injection HENT: No oropharyngeal obstruction.  MSK: no joint deformities.  Cardiovascular: Normal rate and regular rhythm.  Respiratory: Effort normal, non-labored breathing GI: Soft.  No distension. There is no tenderness.  Skin: Warm dry and intact visible skin  Neuro: Mental Status: Patient is awake, alert, oriented to person, place, month, year, and situation. Patient is able to give a clear and coherent history. No signs of aphasia or neglect Cranial Nerves: II: Visual Fields are full. Pupils are round, and reactive to light though she has mild physiologic anisocoria with the left pupil 1 mm larger than the right.  No  clear APD III,IV, VI: EOMI without ptosis or diploplia.  V: Facial sensation is slightly reduced to light touch in V2 and V3 distributions VII: Facial movement is notable for a left facial droop VIII: hearing is intact to voice X: Uvula elevates symmetrically XI: Shoulder shrug is symmetric. XII: tongue is midline without atrophy or fasciculations.  Motor: She has contractures in the left arm and leg that have been gradually developing, as well as some diffuse atrophy.  She additionally has extremely pain limited motion of her right hip.  She does have some drift of the left upper extremity on pronator drift testing and on formal confrontational strength testing she is 4/5 throughout the left upper extremity, slightly weaker on extension than flexion as would be expected with an upper motor neuron lesion.  She has 5/5 throughout the right upper extremity.  She is  2/5 at hip flexion on the right lower extremity, and 2/5 in foot dorsiflexion and plantarflexion Sensory: Sensation is symmetric to light touch in the arms and legs without extinction to double sided multi stimuli Deep Tendon Reflexes: 2+ and symmetric in the biceps, 2+ in the left patella, difficult to elicit in the right patella.  Plantars: Toes are upgoing on the left and downgoing on the right Cerebellar: Finger-nose is intact within limits of weakness.  NIHSS total 10 Score breakdown: 2 points for left facial droop, one-point for left upper extremity weakness, 3 points for left lower extremity weakness, 2 points for right lower extremity weakness, one-point for slight loss of sensation on the left lower face, one-point for mild to moderate dysarthria    I have reviewed labs in epic and the results pertinent to this consultation are: Mild hyponatremia at 133, creatinine near her baseline of 0.5 (0.68), glucose 99 CBC within normal limits PT, INR and PTT within normal limits  I have reviewed the images obtained: Head CT personally reviewed, agree with radiology that there is moderate patchy white matter hypoattenuation consistent with chronic microvascular disease and/or multiple sclerosis sequelae MRI brain personally reviewed, right corona radiata acute infarct without associated contrast-enhancement or hemorrhage   Echocardiogram from 12/16/2018 was notable for grade 2 diastolic dysfunction with normal EF, mild to moderate aortic regurgitation and mitral valve regurgitation as well as mild to moderate left atrial enlargement and moderate right atrial enlargement with mildly increased pulmonary artery pressure  Impression: This is an 87 year old woman with past medical history significant for primary progressive MS, hypertension, hyperlipidemia, coronary artery disease, heart failure with preserved EF, remote temporal arteritis presenting with a right corona radiata stroke most likely  small vessel disease in the setting of her risk factors of age, hypertension, hyperlipidemia  Recommendations: #Right corona radiata stroke, likely small vessel disease but cannot rule out embolic etiology - Stroke labs HgbA1c, fasting lipid panel - MRI brain reviewed as above - CTA head and neck - Frequent neuro checks - Echocardiogram - Prophylactic therapy-Antiplatelet med: Aspirin - dose 325mg  PO or 300mg  PR, followed by 81 mg daily - Plavix 300 mg load with 75 mg daily for 21 - 90 day course, timed to start 7/1 at 10 AM to allow SLP evaluation - Risk factor modification - Telemetry monitoring; 30 day event monitor on discharge if no arrythmias captured given history of biatrial enlargement if no other indication for lifelong anticoagulation already determined - Blood pressure goal: Normotension - PT consult, OT consult, Speech consult - Stroke team to follow  Lesleigh Noe MD-PhD [facility] [phone] Available 7 PM to  7 AM, outside of these hours please call Neurologist on call as listed on Amion.

## 2021-06-07 NOTE — H&P (Signed)
History and Physical   [name] AOZ:[id number] DOB: 1931-12-23 DOA: 06/07/2021  PCP: [name], MD   Patient coming from: Home  Chief Complaint: Left-sided weakness, facial droop, slurred speech  HPI: [name] is a 87 y.o. female with medical history significant of multiple sclerosis, hypothyroidism, hypertension, hyperlipidemia, rheumatoid arthritis, giant cell arteritis, falls, CHF, venous insufficiency, chronic edema, osteoporosis, acetabular fracture presents with new onset weakness. Patient was last normal before going to bed around 11 PM last night.  Upon waking this morning she had left facial droop and slurred speech also later noted to have left-sided weakness.  She is in a wheelchair at baseline with some right-sided deficits due to reported unrepairable right-sided hip fracture.  However she is able to care for self and complete ADLs.  She reports having history of mild carotid artery disease. She denies fevers, chills, chest pain, shortness of breath, abdominal pain, constipation, diarrhea, nausea, vomiting.   ED Course: Vital signs in the ED are stable.  Lab work-up showed CMP with sodium 133, protein 6.2, CBC within normal limits, PT, PTT, INR within normal limits.  CT head showed no acute normality but did show moderate patchy white matter hypoattenuation concerning for ischemia versus demyelination given her history of MS.  MRI confirmed acute CVA but also showed chronic lesions likely combination of demyelination and ischemia.  Neurology is aware of patient and will see her.  Recommended CTA given her history and will get a full stroke work-up.  Review of Systems: As per HPI otherwise all other systems reviewed and are negative.  Past Medical History:  Diagnosis Date   Chronic venous insufficiency of lower extremity 01/25/2019   With chronic bilateral lower extremity edema   Incontinent of urine    Multiple sclerosis ([place]) 2015   Osteoporosis     osteopenia   Polyarthritis    Post-menopausal    Rheumatoid arthritis ([place])    Temporal arteritis ([place]) 2011    Past Surgical History:  Procedure Laterality Date   ABDOMINAL HYSTERECTOMY  1971   TVH   breast ductectomy Right 05/1995   COLONOSCOPY  10/2011    Social History  reports that she has never smoked. She has never used smokeless tobacco. She reports that she does not drink alcohol and does not use drugs.  Allergies  Allergen Reactions   Sulfasalazine Anaphylaxis   Aspirin Nausea Only   Penicillins Diarrhea   Sulfa Antibiotics    Sulfamethoxazole-Trimethoprim Other (See Comments)    Family History  Problem Relation Age of Onset   Osteoarthritis Father    Stroke Father    Heart failure Mother    Hypertension Mother    Heart disease Mother    Multiple sclerosis Cousin    Multiple sclerosis Cousin    Multiple sclerosis Cousin   Reviewed on admission  Prior to Admission medications   Medication Sig Start Date End Date Taking? Authorizing Provider  baclofen (LIORESAL) 10 MG tablet Take 10 mg by mouth 3 (three) times daily. 01/01/21  Yes [provider]  Biotin 10000 MCG TABS Take 1,000 mcg by mouth daily.   Yes [provider]  levothyroxine (SYNTHROID) 25 MCG tablet Take 1 tablet (25 mcg total) by mouth daily. 06/06/20  Yes Lassen, Arlo C, PA-C  Magnesium 250 MG TABS Take 1 tablet by mouth daily.   Yes [provider]  Multiple Vitamins-Minerals (ICAPS AREDS FORMULA PO) Take 1 capsule by mouth daily.    Yes [provider]  nabumetone (  RELAFEN) 500 MG tablet Take 1 tablet (500 mg total) by mouth 2 (two) times daily as needed. 12/27/20  Yes [name], Nanine Means, MD  Vitamin D, Ergocalciferol, (DRISDOL) 1.25 MG (50000 UNIT) CAPS capsule Take 50,000 Units by mouth every 7 (seven) days.   Yes [provider]  vitamin E 45 MG (100 UNITS) capsule Take by mouth.   Yes [provider]  Abatacept (ORENCIA) 125 MG/ML SOSY 1 ml     [provider]  acetaminophen (TYLENOL) 325 MG tablet Take 650 mg by mouth every 6 (six) hours as needed.    [provider]  acetaminophen (TYLENOL) 325 MG tablet Take 650 mg by mouth every 8 (eight) hours.     [provider]  acetaminophen-codeine (TYLENOL #3) 300-30 MG tablet Take 1 tablet by mouth every 6 (six) hours as needed for moderate pain. 07/05/20   Suzan Slick, NP  aspirin 81 MG EC tablet Take by mouth.    [provider]  Azelastine HCl 137 MCG/SPRAY SOLN Place 1 spray into both nostrils 2 (two) times daily as needed (As needed for allergies). 06/06/20   Wille Celeste, PA-C  Biotin (BIOTIN FORTE) 5 MG TABS 1 tablet    [provider]  cadexomer iodine (IODOSORB) 0.9 % gel Apply 1 application topically daily as needed for wound care. 12/29/20   Marzetta Board, DPM  Calcium Carb-Cholecalciferol (CALCIUM-VITAMIN D3) 250-125 MG-UNIT TABS Take 1 tablet by mouth 2 (two) times daily.    [provider]  Calcium Carbonate-Vitamin D 600-200 MG-UNIT CAPS 1 tablet.    [provider]  Carboxymethylcellul-Glycerin (LUBRICATING EYE DROPS OP) Place 1 drop into both eyes 2 (two) times daily. For Eye Dryness 05/03/20   [provider]  denosumab (PROLIA) 60 MG/ML SOSY injection See admin instructions. 03/07/20   [provider]  diclofenac Sodium (VOLTAREN) 1 % GEL APPLY 2 GMS TOPICALLY TO AFFECTED AREAS (RIGHT HIP) TWICE DAILY AS NEEDED FOR PAIN 06/06/20   Granville Lewis C, PA-C  folic acid (FOLVITE) 1 MG tablet 2 tablets 10/16/20   [provider]  leflunomide (ARAVA) 10 MG tablet  08/10/20   [provider]  losartan (COZAAR) 25 MG tablet Take 1 tablet (25 mg total) by mouth daily. 06/06/20   Granville Lewis C, PA-C  magnesium gluconate (MAGONATE) 500 MG tablet Take by mouth.    [provider]  magnesium hydroxide (MILK OF MAGNESIA) 400 MG/5ML suspension Take 30 mLs by mouth daily as needed  for mild constipation.    [provider]  methocarbamol (ROBAXIN) 500 MG tablet Take 500 mg by mouth every 8 (eight) hours as needed for muscle spasms.     [provider]  methocarbamol (ROBAXIN) 500 MG tablet Take 1 tablet (500 mg total) by mouth 3 (three) times daily. ( WITH 1ST ADMINISTRATION EARLY AM) 06/06/20   Wille Celeste, PA-C  methotrexate 2.5 MG tablet Take 6 tablets (15 mg total) by mouth every Thursday. Give 6 tablets to = 15 mg 06/08/20   Granville Lewis C, PA-C  mupirocin ointment (BACTROBAN) 2 % Apply 1 application topically daily. 01/23/21   McDonald, Stephan Minister, DPM  NON FORMULARY DIET: LIBERALIZE DIET TO REGULAR    [provider]  polyethylene glycol (MIRALAX / GLYCOLAX) 17 g packet Take 17 g by mouth daily. 03/27/20   Domenic Polite, MD  predniSONE (DELTASONE) 1 MG tablet 3 tablets 11/17/19   [provider]  Sarilumab [facility]) 200 MG/1.14ML SOSY  Inject 200 mg into the skin daily. Once every other week 06/06/20   Wille Celeste, PA-C  senna-docusate (SENOKOT-S) 8.6-50 MG tablet Take 1 tablet by mouth 2 (two) times daily. 03/26/20   Domenic Polite, MD  sodium chloride 1 g tablet Take 1 tablet (1 g total) by mouth 3 (three) times daily with meals. 06/06/20   Granville Lewis C, PA-C  sodium chloride irrigation 0.9 % irrigation Use saline to cleanse wound once daily. 12/29/20   [provider]  SODIUM CHLORIDE, EXTERNAL, 0.9 % SOLN Use saline to cleanse wound once daily. 12/29/20   Marzetta Board, DPM  SODIUM PHOSPHATES RE Place 1 each rectally daily as needed.    [provider]  triamcinolone cream (KENALOG) 0.1 % Apply topically. 09/27/16   [provider]    Physical Exam: Vitals:   06/07/21 1800 06/07/21 1815 06/07/21 1830 06/07/21 1915  BP: (!) 145/65 122/71 131/88 (!) 160/80  Pulse: 66 66 69 65  Resp: 14 15 14 17   Temp:      TempSrc:      SpO2: 95% 95% 96% 97%  Weight:      Height:       Physical  Exam Constitutional:      General: She is not in acute distress.    Appearance: Normal appearance.  HENT:     Head: Normocephalic and atraumatic.     Mouth/Throat:     Mouth: Mucous membranes are moist.     Pharynx: Oropharynx is clear.  Eyes:     Extraocular Movements: Extraocular movements intact.     Pupils: Pupils are equal, round, and reactive to light.  Cardiovascular:     Rate and Rhythm: Normal rate and regular rhythm.     Pulses: Normal pulses.     Heart sounds: Normal heart sounds.  Pulmonary:     Effort: Pulmonary effort is normal. No respiratory distress.     Breath sounds: Normal breath sounds.  Abdominal:     General: Bowel sounds are normal. There is no distension.     Palpations: Abdomen is soft.     Tenderness: There is no abdominal tenderness.  Musculoskeletal:        General: No swelling or deformity.  Skin:    General: Skin is warm and dry.  Neurological:     Comments: Mental Status: Patient is awake, alert, oriented No signs of aphasia or neglect Cranial Nerves: II: Pupils equal, round, and reactive to light.   III,IV, VI: EOMI without ptosis or diploplia.  V: Facial sensation is decreased on the left VII: Facial movement decreased on the left, with left facial droop.  VIII: hearing is intact to voice X: Uvula elevates symmetrically XI: Shoulder shrug is decreased on the left XII: tongue is midline without atrophy or fasciculations.  Motor: Adequate effort thorughout, at Least 2-3/5 left lower extremity, 3-4/5 right lower extremity, 4-5/5 right upper extremity, 3-4/5 left upper extremity Sensory: Sensation is grossly intact bilateral UEs & LEs  Cerebellar: Finger-Nose intact, but limited on the left due to weakness   Labs on Admission: I have personally reviewed following labs and imaging studies  CBC: Recent Labs  Lab 06/07/21 1141 06/07/21 1211  WBC 6.3  --   NEUTROABS 2.9  --   HGB 14.3 14.3  HCT 41.9 42.0  MCV 93.1  --   PLT 201  --      Basic Metabolic Panel: Recent Labs  Lab 06/07/21 1141 06/07/21 1211  NA 133* 134*  K 4.3 4.2  CL 99 99  CO2 22  --   GLUCOSE 99 104*  BUN 15 19  CREATININE 0.68 0.60  CALCIUM 9.2  --     GFR: Estimated Creatinine Clearance: 43.4 mL/min (by C-G formula based on SCr of 0.6 mg/dL).  Liver Function Tests: Recent Labs  Lab 06/07/21 1141  AST 35  ALT 19  ALKPHOS 67  BILITOT 0.7  PROT 6.2*  ALBUMIN 3.9    Urine analysis:    Component Value Date/Time   COLORURINE STRAW (A) 03/23/2020 0201   APPEARANCEUR CLEAR 03/23/2020 0201   LABSPEC 1.006 03/23/2020 0201   PHURINE 7.0 03/23/2020 0201   GLUCOSEU NEGATIVE 03/23/2020 0201   HGBUR NEGATIVE 03/23/2020 0201   BILIRUBINUR NEGATIVE 03/23/2020 0201   KETONESUR 5 (A) 03/23/2020 0201   PROTEINUR NEGATIVE 03/23/2020 0201   NITRITE NEGATIVE 03/23/2020 0201   LEUKOCYTESUR NEGATIVE 03/23/2020 0201    Radiological Exams on Admission: CT HEAD WO CONTRAST  Result Date: 06/07/2021 CLINICAL DATA:  Neuro deficit, acute stroke suspected. EXAM: CT HEAD WITHOUT CONTRAST TECHNIQUE: Contiguous axial images were obtained from the base of the skull through the vertex without intravenous contrast. COMPARISON:  MRI Apr 20, 2021.  CT head April 03, 2010. FINDINGS: Brain: No evidence of acute large vascular territory infarction, hemorrhage, hydrocephalus, extra-axial collection or mass lesion/mass effect. Moderate patchy white matter hypoattenuation. Generalized atrophy with ex vacuo ventricular dilation. Vascular: No hyperdense vessel identified. Calcific atherosclerosis. Skull: No acute fracture. Sinuses/Orbits: Visualized sinuses are largely clear. No acute orbital findings. Other: No mastoid effusions. IMPRESSION: 1. No evidence of acute large vascular territory infarct or acute hemorrhage. 2. Moderate patchy white matter hypoattenuation, which could represent chronic microvascular ischemic disease and/or demyelination in this patient with a  history of multiple sclerosis. An MRI with contrast could provide more sensitive evaluation for acute infarct or active demyelination if clinically indicated. Electronically Signed   By: Margaretha Sheffield MD   On: 06/07/2021 14:39   MR Brain W and Wo Contrast  Result Date: 06/07/2021 CLINICAL DATA:  Neuro deficit, acute, stroke suspected. EXAM: MRI HEAD WITHOUT AND WITH CONTRAST TECHNIQUE: Multiplanar, multiecho pulse sequences of the brain and surrounding structures were obtained without and with intravenous contrast. CONTRAST:  61mL GADAVIST GADOBUTROL 1 MMOL/ML IV SOLN COMPARISON:  Head CT June 07, 2021.  MRI of the brain Apr 20, 2021. FINDINGS: Brain: Small area of restricted diffusion extending from the posterior aspect of the right putamen into the corona radiata, consistent with acute/subacute infarct. No hemorrhage, hydrocephalus, extra-axial collection or mass lesion. Remote lacunar infarct in the right cerebellar hemisphere scattered and confluent foci of T2 hyperintensity are seen within the white matter of the cerebral hemispheres and within the pons, similar to prior MRI. No focus of abnormal contrast enhancement. Moderate parenchymal volume loss. Vascular: Normal flow voids. Skull and upper cervical spine: Normal marrow signal. Sinuses/Orbits: Negative. IMPRESSION: 1. Focus of restricted diffusion involving the right putamen extending into the corona radiata consistent with acute/subacute infarct. 2. Moderate chronic white matter lesions, likely a combination of demyelinating disease and chronic microvascular ischemia, unchanged from prior MRI. No focus of abnormal contrast enhancement to suggest active demyelination. 3. Remote lacunar infarct in the right cerebellar hemisphere. 4. Moderate parenchymal volume loss. Electronically Signed   By: Pedro Earls M.D.   On: 06/07/2021 16:37    EKG: Independently reviewed.  Sinus rhythm versus 4-1 atrial flutter difficult to determine due  to significant baseline artifact.  Similar  to previous.  67 bpm.  Assessment/Plan Principal Problem:   Acute CVA (cerebrovascular accident) ([place]) Active Problems:   Acetabular fracture (HCC)   Hypertension   Hypothyroidism   Congestive heart failure ([place])   Multiple sclerosis (HCC)   Chronic edema  Acute CVA > Was normal before going to bed and awoke with left-sided facial droop and slurred speech also noted to have left-sided weakness. > Is in wheelchair at baseline with some mild right-sided deficits due to her MS. > Acute stroke confirmed on MRI. > History of carotid artery disease CTA head and neck has been ordered. > Neurology is aware and will see the patient. - Appreciate neurology recommendations - Allow for permissive HTN (systolic < 814 and diastolic < 481) - Continue ASA 81 mg daily  - Start atorvastatin   - Echocardiogram  - CTA head & neck  - A1C  - Lipid panel  - Tele monitoring  - SLP eval - PT/OT  MS Acetabular fracture > Some right-sided deficits due to prior unrepaired hip fracture.  Prior imaging shows healed acetabular fracture with superior migration of the false acetabulum.  Progressive avascular necrosis and collapse of the femoral head. - Appreciate any recommendations neurology may have - Not currently on any medications for her MS per patient report, other than muscle relaxants - Continue baclofen and Robaxin  Diastolic CHF > Last echo in 2020 with EF 60-65%, G2 DD moderate aortic regurgitation moderate mitral regurgitation - Not currently on any diuretics  Hypertension - Holding home antihypertensives in setting of acute CVA as above, though she reports she is not currently taking losartan.  Hypothyroidism - Continue home Synthroid  Rheumatoid arthritis Osteoarthritis > States that she had a second opinion at [facility] and they reported that she does not have rheumatoid arthritis, she does have osteoarthritis.  Hard of hearing - Noted  DVT  prophylaxis: Lovenox  Code Status:   DNR  Family Communication:  Daughter updated at bedside Disposition Plan:   Patient is from:  Home  Anticipated DC to:  Home  Anticipated DC date:  1 to 3 days  Anticipated DC barriers: None  Consults called:  Neurology consulted in ED and are aware of patient but have not yet seen  Admission status:  Observation, telemetry   Severity of Illness: The appropriate patient status for this patient is OBSERVATION. Observation status is judged to be reasonable and necessary in order to provide the required intensity of service to ensure the patient's safety. The patient's presenting symptoms, physical exam findings, and initial radiographic and laboratory data in the context of their medical condition is felt to place them at decreased risk for further clinical deterioration. Furthermore, it is anticipated that the patient will be medically stable for discharge from the hospital within 2 midnights of admission. The following factors support the patient status of observation.   " The patient's presenting symptoms include left-sided facial droop, slurred speech, left-sided weakness. " The physical exam findings include left-sided weakness, left facial droop. " The initial radiographic and laboratory data are MRI with acute CVA, chronic lesions likely combination of demyelination and ischemia.  Sodium 133, protein 6.2.   Marcelyn Bruins MD [facility]  How to contact the [facility] Attending or Consulting provider [place] or covering provider during after hours [place], for this patient?   Check the care team in [facility] and look for a) attending/consulting [facility] provider listed and b) the [facility] team listed Log into [email or web address] and use [facility]'s universal password to access. If you do not have the password, please contact the hospital operator. Locate the [facility] provider you are looking for under [facility] and page to a number that you can be directly  reached. If you still have difficulty reaching the provider, please page the [facility] (Director on Call) for the Hospitalists listed on amion for assistance.  06/07/2021, 7:36 PM

## 2021-06-07 NOTE — ED Provider Notes (Signed)
[facility] EMERGENCY DEPARTMENT Provider Note   CSN: [id number] Arrival date & time: 06/07/21  1116     History Chief Complaint  Patient presents with   Facial Droop    [name] is a 87 y.o. female.  HPI Patient reports that she does have MS and at baseline has right sided problems.  She reports she has a hip injury that is nonrepairable on the right.  She does function out of her wheelchair in her home.  She however is independent and can make food and do multiple things for herself.  She does have in home assistance to help her.  She reports yesterday for a limited period of time in the afternoon she felt like her speech was slurred.  She was embarrassed and did not want to tell anybody about it.  She reports that it got better by evening and seem to be resolved.  Per report, she went to bed at 11 PM at baseline, she reports this morning at about 7 AM when she got up, she then also noticed her speech was slurred and felt that she was experiencing some weakness on her left side.  Also, she noted to have a facial droop on the left.  No new visual changes.  No fever.  Patient reports has been feeling well.  She has not been having cough shortness of breath or chest pain.    Past Medical History:  Diagnosis Date   Chronic venous insufficiency of lower extremity 01/25/2019   With chronic bilateral lower extremity edema   Incontinent of urine    Multiple sclerosis ([place]) 2015   Osteoporosis    osteopenia   Polyarthritis    Post-menopausal    Rheumatoid arthritis ([place])    Temporal arteritis ([place]) 2011    Patient Active Problem List   Diagnosis Date Noted   Atherosclerotic heart disease of native coronary artery without angina pectoris 04/13/2021   Chronic edema 04/13/2021   Difficulty in walking, not elsewhere classified 04/13/2021   Frail elderly 04/13/2021   Giant cell arteritis ([place]) 04/13/2021   Hypercholesterolemia 04/13/2021   Hypertensive  heart disease without congestive heart failure 04/13/2021   Hypomagnesemia 04/13/2021   Osteoarthritis 04/13/2021   Protein calorie malnutrition ([place]) 04/13/2021   Age-related osteoporosis without current pathological fracture 12/13/2020   Congestive heart failure ([place]) 12/13/2020   Monoclonal paraproteinemia 12/13/2020   Multiple sclerosis ([place]) 12/13/2020   Unspecified abnormal finding in specimens from other organs, systems and tissues 12/13/2020   Vitamin D deficiency 12/13/2020   Hyponatremia 04/09/2020   Acetabular fracture ([place]) 04/01/2020   Closed right ischial fracture ([place]) 04/01/2020   Hypertension 04/01/2020   Hypothyroidism 04/01/2020   Frequent falls 03/23/2020   RA (rheumatoid arthritis) ([place]) 03/23/2020   Tympanic membrane central perforation 12/15/2019   Cholesteatoma of external auditory canal, right 09/10/2019   Hearing loss 09/10/2019   Chronic venous insufficiency of lower extremity 01/25/2019   Grade II diastolic dysfunction 26/83/4196   Urinary dysfunction 12/22/2017   Multiple sclerosis, primary progressive ([place]) 06/19/2017   Left foot drop 03/17/2017   Primary chronic progressive multiple sclerosis ([place]) 03/17/2017   Myelopathy ([place]) 03/17/2017   Bilateral lower extremity edema 03/17/2017    Past Surgical History:  Procedure Laterality Date   ABDOMINAL HYSTERECTOMY  1971   TVH   breast ductectomy Right 05/1995   COLONOSCOPY  10/2011     OB History     Gravida  2   Para  2  Term      Preterm      AB      Living  2      SAB      IAB      Ectopic      Multiple      Live Births              Family History  Problem Relation Age of Onset   Osteoarthritis Father    Stroke Father    Heart failure Mother    Hypertension Mother    Heart disease Mother    Multiple sclerosis Cousin    Multiple sclerosis Cousin    Multiple sclerosis Cousin     Social History   Tobacco Use   Smoking status: Never   Smokeless tobacco:  Never  Substance Use Topics   Alcohol use: No   Drug use: No    Home Medications Prior to Admission medications   Medication Sig Start Date End Date Taking? Authorizing Provider  Abatacept (ORENCIA) 125 MG/ML SOSY 1 ml    [provider]  acetaminophen (TYLENOL) 325 MG tablet Take 650 mg by mouth every 6 (six) hours as needed.    [provider]  acetaminophen (TYLENOL) 325 MG tablet Take 650 mg by mouth every 8 (eight) hours.     [provider]  acetaminophen-codeine (TYLENOL #3) 300-30 MG tablet Take 1 tablet by mouth every 6 (six) hours as needed for moderate pain. 07/05/20   Suzan Slick, NP  aspirin 81 MG EC tablet Take by mouth.    [provider]  Azelastine HCl 137 MCG/SPRAY SOLN Place 1 spray into both nostrils 2 (two) times daily as needed (As needed for allergies). 06/06/20   Wille Celeste, PA-C  baclofen (LIORESAL) 10 MG tablet  01/01/21   [provider]  Biotin (BIOTIN FORTE) 5 MG TABS 1 tablet    [provider]  Biotin 10000 MCG TABS Take 1,000 mcg by mouth daily.    [provider]  cadexomer iodine (IODOSORB) 0.9 % gel Apply 1 application topically daily as needed for wound care. 12/29/20   Marzetta Board, DPM  Calcium Carb-Cholecalciferol (CALCIUM-VITAMIN D3) 250-125 MG-UNIT TABS Take 1 tablet by mouth 2 (two) times daily.    [provider]  Calcium Carbonate-Vitamin D 600-200 MG-UNIT CAPS 1 tablet.    [provider]  Carboxymethylcellul-Glycerin (LUBRICATING EYE DROPS OP) Place 1 drop into both eyes 2 (two) times daily. For Eye Dryness 05/03/20   [provider]  denosumab (PROLIA) 60 MG/ML SOSY injection See admin instructions. 03/07/20   [provider]  diclofenac Sodium (VOLTAREN) 1 % GEL APPLY 2 GMS TOPICALLY TO AFFECTED AREAS (RIGHT HIP) TWICE DAILY AS NEEDED FOR PAIN 06/06/20   Granville Lewis C, PA-C  folic acid (FOLVITE) 1 MG tablet 2 tablets 10/16/20   [provider]  leflunomide (ARAVA) 10 MG tablet  08/10/20   [provider]  levothyroxine (SYNTHROID) 25 MCG tablet Take 1 tablet (25 mcg total) by mouth daily. 06/06/20   Granville Lewis C, PA-C  losartan (COZAAR) 25 MG tablet Take 1 tablet (25 mg total) by mouth daily. 06/06/20   Wille Celeste, PA-C  Magnesium 500 MG TABS Take 250 tablets by mouth every evening. 0.5 tablet    [provider]  magnesium gluconate (MAGONATE) 500 MG tablet Take by mouth.    [provider]  magnesium hydroxide (MILK OF MAGNESIA) 400 MG/5ML suspension Take 30  mLs by mouth daily as needed for mild constipation.    [provider]  methocarbamol (ROBAXIN) 500 MG tablet Take 500 mg by mouth every 8 (eight) hours as needed for muscle spasms.     [provider]  methocarbamol (ROBAXIN) 500 MG tablet Take 1 tablet (500 mg total) by mouth 3 (three) times daily. ( WITH 1ST ADMINISTRATION EARLY AM) 06/06/20   Wille Celeste, PA-C  methotrexate 2.5 MG tablet Take 6 tablets (15 mg total) by mouth every Thursday. Give 6 tablets to = 15 mg 06/08/20   Granville Lewis C, PA-C  Multiple Vitamins-Minerals (ICAPS AREDS FORMULA PO) Take 1 capsule by mouth daily.     [provider]  mupirocin ointment (BACTROBAN) 2 % Apply 1 application topically daily. 01/23/21   McDonald, Stephan Minister, DPM  nabumetone (RELAFEN) 500 MG tablet Take 1 tablet (500 mg total) by mouth 2 (two) times daily as needed. 12/27/20   [name], Nanine Means, MD  NON FORMULARY DIET: LIBERALIZE DIET TO REGULAR    [provider]  polyethylene glycol (MIRALAX / GLYCOLAX) 17 g packet Take 17 g by mouth daily. 03/27/20   Domenic Polite, MD  predniSONE (DELTASONE) 1 MG tablet 3 tablets 11/17/19   [provider]  Sarilumab [facility]) 200 MG/1.14ML SOSY Inject 200 mg into the skin daily. Once every other week 06/06/20   Wille Celeste, PA-C  senna-docusate (SENOKOT-S) 8.6-50 MG tablet Take 1 tablet by mouth 2 (two) times daily.  03/26/20   Domenic Polite, MD  sodium chloride 1 g tablet Take 1 tablet (1 g total) by mouth 3 (three) times daily with meals. 06/06/20   Granville Lewis C, PA-C  sodium chloride irrigation 0.9 % irrigation Use saline to cleanse wound once daily. 12/29/20   [provider]  SODIUM CHLORIDE, EXTERNAL, 0.9 % SOLN Use saline to cleanse wound once daily. 12/29/20   Marzetta Board, DPM  SODIUM PHOSPHATES RE Place 1 each rectally daily as needed.    [provider]  triamcinolone cream (KENALOG) 0.1 % Apply topically. 09/27/16   [provider]  Vitamin D, Ergocalciferol, (DRISDOL) 1.25 MG (50000 UNIT) CAPS capsule Take 50,000 Units by mouth every 7 (seven) days.    [provider]  vitamin E 45 MG (100 UNITS) capsule Take by mouth.    [provider]    Allergies    Sulfasalazine, Aspirin, Penicillins, Sulfa antibiotics, and Sulfamethoxazole-trimethoprim  Review of Systems   Review of Systems 10 systems reviewed and negative except as per HPI Physical Exam Updated Vital Signs LMP 09/08/1970 (Approximate)   SpO2 99%   Physical Exam Constitutional:      Comments: Patient is alert.  She is lying supine.  No respiratory distress.  HENT:     Head: Normocephalic and atraumatic.     Mouth/Throat:     Mouth: Mucous membranes are moist.     Pharynx: Oropharynx is clear.  Eyes:     Extraocular Movements: Extraocular movements intact.  Cardiovascular:     Rate and Rhythm: Normal rate and regular rhythm.  Pulmonary:     Effort: Pulmonary effort is normal.     Breath sounds: Normal breath sounds.  Abdominal:     General: There is no distension.     Palpations: Abdomen is soft.     Tenderness: There is no abdominal tenderness. There is no guarding.  Musculoskeletal:     Cervical back: Neck supple.     Comments: Patient appears to have  some arterial insufficiency of the feet.  There is erythema but the feet are well-maintained without any active  wounds.  She is wearing compression on the heels and lower portion of the legs.  Skin:    General: Skin is warm and dry.  Neurological:     Comments: Patient is alert.  She does appear to have mild left facial droop.  Slightly difficult to assess for strength in the upper extremities.  Patient can extend both extremities but seems to be a little bit challenged to have them completely straight out orders and supinated.  Slightly better at getting full extension supination on the right than left.  Grip strength on the right 5\5.  Grip strength on the 4\5.  Patient has chronic hip dysfunction on the right limiting her ability to perform lower extremity strength testing on the right.  She can move the left lower extremity and does not perceive it to be weaker than baseline.  Psychiatric:        Mood and Affect: Mood normal.    ED Results / Procedures / Treatments   Labs (all labs ordered are listed, but only abnormal results are displayed) Labs Reviewed - No data to display  EKG EKG Interpretation  Date/Time:  Thursday June 07 2021 11:30:45 EDT Ventricular Rate:  67 PR Interval:    QRS Duration: 114 QT Interval:  449 QTC Calculation: 474 R Axis:   -54 Text Interpretation: Atrial flutter with predominant 4:1 AV block Left anterior fascicular block Anteroseptal infarct, age indeterminate no sig change from previous Confirmed by Charlesetta Shanks [phone]) on 06/07/2021 3:34:23 PM  Radiology No results found.  Procedures Procedures   Medications Ordered in ED Medications - No data to display  ED Course  I have reviewed the triage vital signs and the nursing notes.  Pertinent labs & imaging results that were available during my care of the patient were reviewed by me and considered in my medical decision making (see chart for details).    MDM Rules/Calculators/A&P                          Patient with H/O MS with suspected new slurred speech, facial droop and left sided weakness.  Patient predominately in wheel chair at home with transfer ability and ADL abilities. Will proceed with MRI to determine CVA vs MS exacerbation.  Dr. Alvino Chapel to f/u MRI results to determine disposition. Final Clinical Impression(s) / ED Diagnoses Final diagnoses:  Cerebrovascular accident (CVA), unspecified mechanism ([place])    Rx / [place] Orders ED Discharge Orders     None        Charlesetta Shanks, MD 06/08/21 [phone]

## 2021-06-07 NOTE — ED Provider Notes (Signed)
  Physical Exam  BP (!) 160/99   Pulse 62   Temp (!) 97.3 F (36.3 C) (Oral)   Resp 14   Ht 5\' 5"  (1.651 m)   Wt 54.4 kg   LMP 09/08/1970 (Approximate)   SpO2 98%   BMI 19.97 kg/m   Physical Exam  ED Course/Procedures     Procedures  MDM  Received patient in signout.  History of MS with chronic right-sided weakness in a wheelchair at baseline but overall rather dependent.  Reportedly some slurred speech and left-sided weakness with last normal yesterday or last night.  MRI done and did show an acute/subacute stroke.  Dr. Malen Gauze from neurology is aware and informed me of the finding.  Will admit to internal medicine.  Not a tPA candidate due to time of onset. Of note patient told me that she is previously been told she had a swelling in her carotid on the right side and that it was 30% blocked about 10 years ago.       Davonna Belling, MD 06/07/21 [phone]

## 2021-06-07 NOTE — ED Notes (Signed)
Patient transported to MRI 

## 2021-06-07 NOTE — ED Triage Notes (Signed)
BIB [facility] after pt family called to report that pt having stroke like symptoms. Per family pt went to bed at 11 and was normal. Upon waking this AM, pt had left facial droop, slurred speech. Per EMS pt also having left sided weakness. No thinners.   LKW 2300 06/06/21  Hx: Arthritis

## 2021-06-07 NOTE — Plan of Care (Signed)

## 2021-06-08 ENCOUNTER — Observation Stay (HOSPITAL_COMMUNITY): Payer: Medicare Other

## 2021-06-08 ENCOUNTER — Inpatient Hospital Stay (HOSPITAL_COMMUNITY): Payer: Medicare Other

## 2021-06-08 DIAGNOSIS — I083 Combined rheumatic disorders of mitral, aortic and tricuspid valves: Secondary | ICD-10-CM | POA: Diagnosis present

## 2021-06-08 DIAGNOSIS — H9190 Unspecified hearing loss, unspecified ear: Secondary | ICD-10-CM | POA: Diagnosis present

## 2021-06-08 DIAGNOSIS — R29704 NIHSS score 4: Secondary | ICD-10-CM | POA: Diagnosis present

## 2021-06-08 DIAGNOSIS — E034 Atrophy of thyroid (acquired): Secondary | ICD-10-CM

## 2021-06-08 DIAGNOSIS — I110 Hypertensive heart disease with heart failure: Secondary | ICD-10-CM | POA: Diagnosis present

## 2021-06-08 DIAGNOSIS — I10 Essential (primary) hypertension: Secondary | ICD-10-CM | POA: Diagnosis not present

## 2021-06-08 DIAGNOSIS — G35 Multiple sclerosis: Secondary | ICD-10-CM | POA: Diagnosis present

## 2021-06-08 DIAGNOSIS — S32401S Unspecified fracture of right acetabulum, sequela: Secondary | ICD-10-CM | POA: Diagnosis not present

## 2021-06-08 DIAGNOSIS — I639 Cerebral infarction, unspecified: Secondary | ICD-10-CM | POA: Diagnosis present

## 2021-06-08 DIAGNOSIS — I5032 Chronic diastolic (congestive) heart failure: Secondary | ICD-10-CM | POA: Diagnosis present

## 2021-06-08 DIAGNOSIS — I6381 Other cerebral infarction due to occlusion or stenosis of small artery: Secondary | ICD-10-CM | POA: Diagnosis present

## 2021-06-08 DIAGNOSIS — E039 Hypothyroidism, unspecified: Secondary | ICD-10-CM | POA: Diagnosis present

## 2021-06-08 DIAGNOSIS — I5030 Unspecified diastolic (congestive) heart failure: Secondary | ICD-10-CM | POA: Diagnosis not present

## 2021-06-08 DIAGNOSIS — I6389 Other cerebral infarction: Secondary | ICD-10-CM

## 2021-06-08 DIAGNOSIS — R29713 NIHSS score 13: Secondary | ICD-10-CM | POA: Diagnosis not present

## 2021-06-08 DIAGNOSIS — G8194 Hemiplegia, unspecified affecting left nondominant side: Secondary | ICD-10-CM | POA: Diagnosis present

## 2021-06-08 DIAGNOSIS — E785 Hyperlipidemia, unspecified: Secondary | ICD-10-CM | POA: Diagnosis present

## 2021-06-08 DIAGNOSIS — Z743 Need for continuous supervision: Secondary | ICD-10-CM | POA: Diagnosis not present

## 2021-06-08 DIAGNOSIS — Z66 Do not resuscitate: Secondary | ICD-10-CM | POA: Diagnosis present

## 2021-06-08 DIAGNOSIS — R4781 Slurred speech: Secondary | ICD-10-CM | POA: Diagnosis present

## 2021-06-08 DIAGNOSIS — I63311 Cerebral infarction due to thrombosis of right middle cerebral artery: Secondary | ICD-10-CM | POA: Diagnosis not present

## 2021-06-08 DIAGNOSIS — K5900 Constipation, unspecified: Secondary | ICD-10-CM | POA: Diagnosis present

## 2021-06-08 DIAGNOSIS — R29707 NIHSS score 7: Secondary | ICD-10-CM | POA: Diagnosis not present

## 2021-06-08 DIAGNOSIS — R5381 Other malaise: Secondary | ICD-10-CM | POA: Diagnosis not present

## 2021-06-08 DIAGNOSIS — R29706 NIHSS score 6: Secondary | ICD-10-CM | POA: Diagnosis not present

## 2021-06-08 DIAGNOSIS — R29712 NIHSS score 12: Secondary | ICD-10-CM | POA: Diagnosis not present

## 2021-06-08 DIAGNOSIS — Z8673 Personal history of transient ischemic attack (TIA), and cerebral infarction without residual deficits: Secondary | ICD-10-CM | POA: Diagnosis not present

## 2021-06-08 DIAGNOSIS — Z823 Family history of stroke: Secondary | ICD-10-CM | POA: Diagnosis not present

## 2021-06-08 DIAGNOSIS — M810 Age-related osteoporosis without current pathological fracture: Secondary | ICD-10-CM | POA: Diagnosis present

## 2021-06-08 DIAGNOSIS — M879 Osteonecrosis, unspecified: Secondary | ICD-10-CM | POA: Diagnosis present

## 2021-06-08 DIAGNOSIS — R29810 Facial weakness: Secondary | ICD-10-CM | POA: Diagnosis present

## 2021-06-08 DIAGNOSIS — Z20822 Contact with and (suspected) exposure to covid-19: Secondary | ICD-10-CM | POA: Diagnosis present

## 2021-06-08 DIAGNOSIS — R531 Weakness: Secondary | ICD-10-CM | POA: Diagnosis not present

## 2021-06-08 LAB — LIPID PANEL
Cholesterol: 232 mg/dL — ABNORMAL HIGH (ref 0–200)
HDL: 70 mg/dL (ref >40)
LDL Cholesterol: 156 mg/dL — ABNORMAL HIGH (ref 0–99)
Total CHOL/HDL Ratio: 3.3 RATIO
Triglycerides: 29 mg/dL (ref <150)
VLDL: 6 mg/dL (ref 0–40)

## 2021-06-08 LAB — ECHOCARDIOGRAM COMPLETE
Area-P 1/2: 2.91 cm2
Height: 65 in
P 1/2 time: 586 msec
S' Lateral: 2.5 cm
Weight: 1920 oz

## 2021-06-08 MED ORDER — CLOPIDOGREL BISULFATE 75 MG PO TABS
75.0000 mg | ORAL_TABLET | Freq: Every day | ORAL | Status: DC
  Administered 2021-06-09 – 2021-06-12 (×4): 75 mg via ORAL
  Filled 2021-06-08 (×4): qty 1

## 2021-06-08 MED ORDER — LACTATED RINGERS IV SOLN: INTRAVENOUS | Status: DC

## 2021-06-08 MED ORDER — DICLOFENAC SODIUM 1 % EX GEL
2.0000 g | Freq: Four times a day (QID) | CUTANEOUS | Status: DC
  Administered 2021-06-08 – 2021-06-12 (×14): 2 g via TOPICAL
  Filled 2021-06-08: qty 100

## 2021-06-08 MED ORDER — CLOPIDOGREL BISULFATE 75 MG PO TABS
300.0000 mg | ORAL_TABLET | Freq: Once | ORAL | Status: AC
  Administered 2021-06-08: 300 mg via ORAL
  Filled 2021-06-08: qty 4

## 2021-06-08 MED ORDER — ENSURE ENLIVE PO LIQD
237.0000 mL | Freq: Two times a day (BID) | ORAL | Status: DC
  Administered 2021-06-09 – 2021-06-12 (×7): 237 mL via ORAL
  Filled 2021-06-08: qty 237

## 2021-06-08 NOTE — Evaluation (Signed)
Speech Language Pathology Evaluation Patient Details Name: [name] MRN: [id number] DOB: Nov 28, 1932 Today's Date: 06/08/2021 Time: 1696-7893 SLP Time Calculation (min) (ACUTE ONLY): 17 min  Problem List:  Patient Active Problem List   Diagnosis Date Noted   Acute CVA (cerebrovascular accident) ([place]) 06/07/2021   Atherosclerotic heart disease of native coronary artery without angina pectoris 04/13/2021   Chronic edema 04/13/2021   Difficulty in walking, not elsewhere classified 04/13/2021   Frail elderly 04/13/2021   Giant cell arteritis ([place]) 04/13/2021   Hypercholesterolemia 04/13/2021   Hypertensive heart disease without congestive heart failure 04/13/2021   Hypomagnesemia 04/13/2021   Osteoarthritis 04/13/2021   Protein calorie malnutrition ([place]) 04/13/2021   Age-related osteoporosis without current pathological fracture 12/13/2020   Congestive heart failure ([place]) 12/13/2020   Monoclonal paraproteinemia 12/13/2020   Multiple sclerosis ([place]) 12/13/2020   Unspecified abnormal finding in specimens from other organs, systems and tissues 12/13/2020   Vitamin D deficiency 12/13/2020   Hyponatremia 04/09/2020   Acetabular fracture ([place]) 04/01/2020   Closed right ischial fracture ([place]) 04/01/2020   Hypertension 04/01/2020   Hypothyroidism 04/01/2020   Frequent falls 03/23/2020   Tympanic membrane central perforation 12/15/2019   Cholesteatoma of external auditory canal, right 09/10/2019   Hearing loss 09/10/2019   Chronic venous insufficiency of lower extremity 01/25/2019   Grade II diastolic dysfunction 81/12/7508   Urinary dysfunction 12/22/2017   Multiple sclerosis, primary progressive ([place]) 06/19/2017   Left foot drop 03/17/2017   Primary chronic progressive multiple sclerosis ([place]) 03/17/2017   Myelopathy ([place]) 03/17/2017   Bilateral lower extremity edema 03/17/2017   Past Medical History:  Past Medical History:  Diagnosis Date   Chronic venous  insufficiency of lower extremity 01/25/2019   With chronic bilateral lower extremity edema   Incontinent of urine    Multiple sclerosis ([place]) 2015   Osteoporosis    osteopenia   Polyarthritis    Post-menopausal    Rheumatoid arthritis ([place])    Temporal arteritis ([place]) 2011   Past Surgical History:  Past Surgical History:  Procedure Laterality Date   ABDOMINAL HYSTERECTOMY  1971   TVH   breast ductectomy Right 05/1995   COLONOSCOPY  10/2011   HPI:  Pt is an 87 y.o. female who presented with left-sided weakness, facial droop, and slurred speech. MRI brain: Focus of restricted diffusion involving the right putamen extending into the corona radiata consistent with acute/subacute infarct. Pt failed the [place] since she stopped drinking. PMH: multiple sclerosis, hypothyroidism, hypertension, hyperlipidemia, rheumatoid arthritis, giant cell arteritis, falls, CHF, venous insufficiency, chronic edema, osteoporosis, acetabular fracture.   Assessment / Plan / Recommendation Clinical Impression  Pt participated in speech/language/cognition evaluation evaluation. Pt reported having some difficulty with memory at baseline, but described use of memory aids. She denied any baseline deficits in speech or language and stated that her speech is now "soupy and sounds lazy".  [facility] Mental Status Examination was completed to evaluate the pt's cognitive-linguistic skills. She achieved a score of 28/30 which is within the normal limits of 27 or more out of 30. No language deficits were noted and her performance on informal cognitive-linguistic tasks was within functional limits. Pt presented with mild-moderate dysarthria characterized by reduced articulatory precision, slightly reduced coordination of respiration with speech, and reduced vocal intensity which together negatively impacted speech intelligibility at the sentence and conversational levels. Skilled SLP services are clinically indicated  at this time to improve dysarthria.    SLP Assessment  SLP Recommendation/Assessment: Patient needs continued Speech  [facility] SLP Visit Diagnosis: Dysarthria and anarthria (R47.1)    Follow Up Recommendations  Inpatient Rehab    Frequency and Duration min 2x/week  2 weeks      SLP Evaluation Cognition  Arousal/Alertness: Awake/alert Orientation Level: Oriented X4 Attention: Focused;Sustained Focused Attention: Appears intact Sustained Attention: Appears intact Memory: Impaired Memory Impairment: Retrieval deficit;Decreased recall of new information (Immediate: 5/5; delayed: 3/5; with cues: 2/2) Awareness: Appears intact Problem Solving: Appears intact Executive Function: Reasoning;Sequencing;Organizing Reasoning: Appears intact Sequencing: Appears intact       Comprehension  Auditory Comprehension Overall Auditory Comprehension: Appears within functional limits for tasks assessed Yes/No Questions: Within Functional Limits Commands: Within Functional Limits Conversation: Complex    Expression Expression Primary Mode of Expression: Verbal Verbal Expression Overall Verbal Expression: Appears within functional limits for tasks assessed Initiation: No impairment Level of Generative/Spontaneous Verbalization: Conversation Repetition: No impairment Naming: No impairment Pragmatics: No impairment   Oral / Motor  Oral Motor/Sensory Function Overall Oral Motor/Sensory Function: Mild impairment Facial ROM: Reduced left;Suspected CN VII (facial) dysfunction Facial Symmetry: Abnormal symmetry left;Suspected CN VII (facial) dysfunction Facial Strength: Reduced left;Suspected CN VII (facial) dysfunction Facial Sensation: Within Functional Limits Lingual ROM: Within Functional Limits Lingual Symmetry: Within Functional Limits Lingual Strength: Reduced;Suspected CN XII (hypoglossal) dysfunction Lingual Sensation: Within Functional Limits Velum:  (possible  uvulectomy) Mandible: Within Functional Limits Motor Speech Overall Motor Speech: Impaired Respiration: Impaired Level of Impairment: Conversation Phonation: Low vocal intensity Resonance: Within functional limits Articulation: Impaired Level of Impairment: Sentence Intelligibility: Intelligibility reduced Word: 75-100% accurate Phrase: 75-100% accurate Sentence: 75-100% accurate Conversation: 50-74% accurate Motor Planning: Witnin functional limits Motor Speech Errors: Consistent;Aware   Jahmal Dunavant I. Hardin Negus, [place], [place] Office number [phone] Pager [phone]                    Horton Marshall 06/08/2021, 10:07 AM

## 2021-06-08 NOTE — Progress Notes (Addendum)
Modified Barium Swallow Progress Note  Patient Details  Name: [name] MRN: [id number] Date of Birth: [birth year]  Today's Date: 06/08/2021  Modified Barium Swallow completed.  Full report located under Chart Review in the Imaging Section.  Brief recommendations include the following:  Clinical Impression  Pt presents with oropharyngeal dysphagia characterized by weak bolus manipulation, reduced labial seal, reduced posterior bolus propulsion, reduced lingual retraction, and reduced anterior laryngeal movement. She demonstrated difficulty with anterior spillage to the left, bolus formation, prolonged mastication, vallecular residue, pyriform sinus residue, penetration (PAS 3,5) and sensed aspiration (PAS 7) with thin liquids and intermittently with nectar thick liquids. Penetration and aspiration often occured after the swallow secondary to spillover of pyriform sinus residue to the larynx or due to subsequent aspiration of penetrated material. All instances of aspiration resulted in coughing which did mobilize the penetrate, but pt's cough was inadequately strong to expel penetrated/aspirated material. Compensatory strategies were ineffective in eliminating laryngeal invasion with thin liquids. No functional benefit was noted with a chin tuck posture or with effortful swallows with nectar thick liquids, but a left head turn posture consistently eliminated laryngeal invasion with nectar thick liquids. Moderate vallecular residue was noted with full-tsp boluses and was eliminated with secondary swallows and/or a liquid wash. Pyriform sinus residue was eliminated with secondary swallows. Transit of the 50mm barium tablet (given with nectar thick liquids) was halted at the level of the pyriform sinuses, but transport was facilitated with use of a puree bolus and secondary swallows. A dysphagia 1 diet with nectar thick liquids is recommended at this time with strict observance of swallowing  precautions. SLP will follow for dysphagia treatment.   Swallow Evaluation Recommendations       SLP Diet Recommendations: Dysphagia 1 (Puree) solids;Nectar thick liquid   Liquid Administration via: Cup;Straw   Medication Administration: Crushed with puree   Supervision: Full supervision/cueing for compensatory strategies;Staff to assist with self feeding   Compensations: Slow rate;Small sips/bites;Follow solids with liquid (left head turn)   Postural Changes: Seated upright at 90 degrees   Oral Care Recommendations: Oral care BID;Patient independent with oral care       Modean Mccullum I. Hardin Negus, [place], [place] Office number [phone] Pager [phone]  Horton Marshall 06/08/2021,2:30 PM

## 2021-06-08 NOTE — Progress Notes (Addendum)
PROGRESS NOTE    [name]  PPI:[id number] DOB: 1932/04/25 DOA: 06/07/2021 PCP: [name], MD    Chief Complaint  Patient presents with   Facial Droop    Brief Narrative:   [name] is a 87 y.o. female with medical history significant of multiple sclerosis, hypothyroidism, hypertension, hyperlipidemia, rheumatoid arthritis, giant cell arteritis, falls, CHF, venous insufficiency, chronic edema, osteoporosis, acetabular fracture presents with new onset weakness. Patient presents with left-sided weakness, facial droop and slurred speech, MRI brain significant for acute/subacute infarct and right putamen extending into the corona radiata, she was admitted for CVA work-up  Assessment & Plan:   Principal Problem:   Acute CVA (cerebrovascular accident) [facility]) Active Problems:   Acetabular fracture ([place])   Hypertension   Hypothyroidism   Congestive heart failure ([place])   Multiple sclerosis ([place])   Chronic edema  Acute CVA -Patient presents with left side weakness, dysphagia, RI brain significant for right corona radiata stroke, CTA head and neck did not reveal any significant stenosis, her 2D echo is pending. -Recommendation for dual antiplatelet therapy for 21 days, followed by aspirin monotherapy, she remains n.p.o. given her dysphagia, plan for MBS this afternoon. -PT/OT consult remains pending -Continue with atorvastatin 80 mg oral daily -With IV fluids till she is able to tolerate oral intake  Dysphagia -Remains with significant dysphagia upon SLP evaluation this morning, plan for MBS study, if does not do well then she will need core track tube and tube feed over the weekend till she is improved.  MS Acetabular fracture - Some right-sided deficits due to prior unrepaired hip fracture.  Prior imaging shows healed acetabular fracture with superior migration of the false acetabulum.  Progressive avascular necrosis and collapse of the femoral head. -  Appreciate any recommendations neurology may have - Not currently on any medications for her MS per patient report, other than muscle relaxants - Continue baclofen and Robaxin   Diastolic CHF > Last echo in 2020 with EF 60-65%, G2 DD moderate aortic regurgitation moderate mitral regurgitation - Not currently on any diuretics   Hypertension - Holding home antihypertensives in setting of acute CVA as above, though she reports she is not currently taking losartan.  Hypothyroidism - Continue home Synthroid   Rheumatoid arthritis Osteoarthritis > States that she had a second opinion at [facility] and they reported that she does not have rheumatoid arthritis, she does have osteoarthritis.   Hard of hearing - Noted    DVT prophylaxis: Lovenox Code Status: Full Family Communication: None at bedside Disposition:   Status is: Inpatient  The patient will require care spanning > 2 midnights and should be moved to inpatient because: IV treatments appropriate due to intensity of illness or inability to take PO  Dispo: The patient is from: Home              Anticipated d/c is to: SNF              Patient currently is not medically stable to d/c.  She remains n.p.o. due to dysphagia, on IV fluids, and further diagnostic work-up including dysphagia including MBS evaluation, as well will need PT OT evaluation.   Difficult to place patient No       Consultants:  Neurology  Subjective:  Patient failed her initial swallowing evaluation, plan for MBS today, likely will require core track tube  Objective: Vitals:   06/07/21 2130 06/07/21 2226 06/08/21 0632 06/08/21 0715  BP: (!) 124/54 (!) 153/63 (!) 118/52  Pulse: 69 67 70   Resp: 19 19 19    Temp:  97.7 F (36.5 C) 98.9 F (37.2 C)   TempSrc:  Oral Oral   SpO2: 97% 98% 98% 96%  Weight:      Height:        Intake/Output Summary (Last 24 hours) at 06/08/2021 1140 Last data filed at 06/08/2021 0109 Gross per 24 hour  Intake --  Output  300 ml  Net -300 ml   Filed Weights   06/07/21 1130  Weight: 54.4 kg    Examination:  Awake Alert, appropriate, follows simple commands Symmetrical Chest wall movement, Good air movement bilaterally, CTAB RRR,No Gallops,Rubs or new Murmurs, No Parasternal Heave +ve B.Sounds, Abd Soft, No tenderness, No rebound - guarding or rigidity. No Cyanosis, she is with left-sided weakness    Data Reviewed: I have personally reviewed following labs and imaging studies  CBC: Recent Labs  Lab 06/07/21 1141 06/07/21 1211  WBC 6.3  --   NEUTROABS 2.9  --   HGB 14.3 14.3  HCT 41.9 42.0  MCV 93.1  --   PLT 201  --     Basic Metabolic Panel: Recent Labs  Lab 06/07/21 1141 06/07/21 1211  NA 133* 134*  K 4.3 4.2  CL 99 99  CO2 22  --   GLUCOSE 99 104*  BUN 15 19  CREATININE 0.68 0.60  CALCIUM 9.2  --     GFR: Estimated Creatinine Clearance: 43.4 mL/min (by C-G formula based on SCr of 0.6 mg/dL).  Liver Function Tests: Recent Labs  Lab 06/07/21 1141  AST 35  ALT 19  ALKPHOS 67  BILITOT 0.7  PROT 6.2*  ALBUMIN 3.9    CBG: Recent Labs  Lab 06/07/21 1129  GLUCAP 99     Recent Results (from the past 240 hour(s))  Resp Panel by RT-PCR (Flu A&B, Covid) Nasopharyngeal Swab     Status: None   Collection Time: 06/07/21  7:30 PM   Specimen: Nasopharyngeal Swab; Nasopharyngeal(NP) swabs in vial transport medium  Result Value Ref Range Status   SARS Coronavirus 2 by RT PCR NEGATIVE NEGATIVE Final    Comment: (NOTE) SARS-CoV-2 target nucleic acids are NOT DETECTED.  The SARS-CoV-2 RNA is generally detectable in upper respiratory specimens during the acute phase of infection. The lowest concentration of SARS-CoV-2 viral copies this assay can detect is 138 copies/mL. A negative result does not preclude SARS-Cov-2 infection and should not be used as the sole basis for treatment or other patient management decisions. A negative result may occur with  improper specimen  collection/handling, submission of specimen other than nasopharyngeal swab, presence of viral mutation(s) within the areas targeted by this assay, and inadequate number of viral copies(<138 copies/mL). A negative result must be combined with clinical observations, patient history, and epidemiological information. The expected result is Negative.  Fact Sheet for Patients:  EntrepreneurPulse.com.au  Fact Sheet for Healthcare Providers:  IncredibleEmployment.be  This test is no t yet approved or cleared by the [place] FDA and  has been authorized for detection and/or diagnosis of SARS-CoV-2 by FDA under an Emergency Use Authorization (EUA). This EUA will remain  in effect (meaning this test can be used) for the duration of the COVID-19 declaration under Section 564(b)(1) of the Act, 21 U.S.C.section 360bbb-3(b)(1), unless the authorization is terminated  or revoked sooner.       Influenza A by PCR NEGATIVE NEGATIVE Final   Influenza B by PCR NEGATIVE NEGATIVE Final  Comment: (NOTE) The Xpert Xpress SARS-CoV-2/FLU/RSV plus assay is intended as an aid in the diagnosis of influenza from Nasopharyngeal swab specimens and should not be used as a sole basis for treatment. Nasal washings and aspirates are unacceptable for Xpert Xpress SARS-CoV-2/FLU/RSV testing.  Fact Sheet for Patients: EntrepreneurPulse.com.au  Fact Sheet for Healthcare Providers: IncredibleEmployment.be  This test is not yet approved or cleared by the [place] FDA and has been authorized for detection and/or diagnosis of SARS-CoV-2 by FDA under an Emergency Use Authorization (EUA). This EUA will remain in effect (meaning this test can be used) for the duration of the COVID-19 declaration under Section 564(b)(1) of the Act, 21 U.S.C. section 360bbb-3(b)(1), unless the authorization is terminated or revoked.  Performed at [facility], [place] [address], [place], [place] [address]          Radiology Studies: CT Angio Head W or Wo Contrast  Result Date: 06/07/2021 CLINICAL DATA:  Neuro deficit, acute, stroke suspected EXAM: CT ANGIOGRAPHY HEAD AND NECK TECHNIQUE: Multidetector CT imaging of the head and neck was performed using the standard protocol during bolus administration of intravenous contrast. Multiplanar CT image reconstructions and MIPs were obtained to evaluate the vascular anatomy. Carotid stenosis measurements (when applicable) are obtained utilizing NASCET criteria, using the distal internal carotid diameter as the denominator. CONTRAST:  66mL OMNIPAQUE IOHEXOL 350 MG/ML SOLN COMPARISON:  None. FINDINGS: CTA NECK FINDINGS SKELETON: There is no bony spinal canal stenosis. No lytic or blastic lesion. OTHER NECK: Normal pharynx, larynx and major salivary glands. No cervical lymphadenopathy. Unremarkable thyroid gland. UPPER CHEST: No pneumothorax or pleural effusion. No nodules or masses. AORTIC ARCH: There is no calcific atherosclerosis of the aortic arch. There is no aneurysm, dissection or hemodynamically significant stenosis of the visualized portion of the aorta. Conventional 3 vessel aortic branching pattern. The visualized proximal subclavian arteries are widely patent. RIGHT CAROTID SYSTEM: Normal without aneurysm, dissection or stenosis. LEFT CAROTID SYSTEM: Normal without aneurysm, dissection or stenosis. VERTEBRAL ARTERIES: Right dominant configuration. Both origins are clearly patent. There is no dissection, occlusion or flow-limiting stenosis to the skull base (V1-V3 segments). CTA HEAD FINDINGS POSTERIOR CIRCULATION: --Vertebral arteries: Left vertebral artery terminates in PICA. Normal right vertebral artery V4 segment --Inferior cerebellar arteries: Normal. --Basilar artery: Normal. --Superior cerebellar arteries: Normal. --Posterior cerebral arteries (PCA): Normal. ANTERIOR CIRCULATION:  --Intracranial internal carotid arteries: Normal. --Anterior cerebral arteries (ACA): Normal. Absent left A1 segment, normal variant --Middle cerebral arteries (MCA): Normal. VENOUS SINUSES: As permitted by contrast timing, patent. ANATOMIC VARIANTS: Persistent trigeminal artery. Absent left A1 segment. Review of the MIP images confirms the above findings. IMPRESSION: 1. No emergent large vessel occlusion or high-grade stenosis of the intracranial arteries. 2. Persistent trigeminal artery, normal variant. Electronically Signed   By: Ulyses Jarred M.D.   On: 06/07/2021 19:55   CT HEAD WO CONTRAST  Result Date: 06/07/2021 CLINICAL DATA:  Neuro deficit, acute stroke suspected. EXAM: CT HEAD WITHOUT CONTRAST TECHNIQUE: Contiguous axial images were obtained from the base of the skull through the vertex without intravenous contrast. COMPARISON:  MRI Apr 20, 2021.  CT head April 03, 2010. FINDINGS: Brain: No evidence of acute large vascular territory infarction, hemorrhage, hydrocephalus, extra-axial collection or mass lesion/mass effect. Moderate patchy white matter hypoattenuation. Generalized atrophy with ex vacuo ventricular dilation. Vascular: No hyperdense vessel identified. Calcific atherosclerosis. Skull: No acute fracture. Sinuses/Orbits: Visualized sinuses are largely clear. No acute orbital findings. Other: No mastoid effusions. IMPRESSION: 1. No evidence of acute large  vascular territory infarct or acute hemorrhage. 2. Moderate patchy white matter hypoattenuation, which could represent chronic microvascular ischemic disease and/or demyelination in this patient with a history of multiple sclerosis. An MRI with contrast could provide more sensitive evaluation for acute infarct or active demyelination if clinically indicated. Electronically Signed   By: Margaretha Sheffield MD   On: 06/07/2021 14:39   CT Angio Neck W and/or Wo Contrast  Result Date: 06/07/2021 CLINICAL DATA:  Neuro deficit, acute, stroke  suspected EXAM: CT ANGIOGRAPHY HEAD AND NECK TECHNIQUE: Multidetector CT imaging of the head and neck was performed using the standard protocol during bolus administration of intravenous contrast. Multiplanar CT image reconstructions and MIPs were obtained to evaluate the vascular anatomy. Carotid stenosis measurements (when applicable) are obtained utilizing NASCET criteria, using the distal internal carotid diameter as the denominator. CONTRAST:  17mL OMNIPAQUE IOHEXOL 350 MG/ML SOLN COMPARISON:  None. FINDINGS: CTA NECK FINDINGS SKELETON: There is no bony spinal canal stenosis. No lytic or blastic lesion. OTHER NECK: Normal pharynx, larynx and major salivary glands. No cervical lymphadenopathy. Unremarkable thyroid gland. UPPER CHEST: No pneumothorax or pleural effusion. No nodules or masses. AORTIC ARCH: There is no calcific atherosclerosis of the aortic arch. There is no aneurysm, dissection or hemodynamically significant stenosis of the visualized portion of the aorta. Conventional 3 vessel aortic branching pattern. The visualized proximal subclavian arteries are widely patent. RIGHT CAROTID SYSTEM: Normal without aneurysm, dissection or stenosis. LEFT CAROTID SYSTEM: Normal without aneurysm, dissection or stenosis. VERTEBRAL ARTERIES: Right dominant configuration. Both origins are clearly patent. There is no dissection, occlusion or flow-limiting stenosis to the skull base (V1-V3 segments). CTA HEAD FINDINGS POSTERIOR CIRCULATION: --Vertebral arteries: Left vertebral artery terminates in PICA. Normal right vertebral artery V4 segment --Inferior cerebellar arteries: Normal. --Basilar artery: Normal. --Superior cerebellar arteries: Normal. --Posterior cerebral arteries (PCA): Normal. ANTERIOR CIRCULATION: --Intracranial internal carotid arteries: Normal. --Anterior cerebral arteries (ACA): Normal. Absent left A1 segment, normal variant --Middle cerebral arteries (MCA): Normal. VENOUS SINUSES: As permitted by  contrast timing, patent. ANATOMIC VARIANTS: Persistent trigeminal artery. Absent left A1 segment. Review of the MIP images confirms the above findings. IMPRESSION: 1. No emergent large vessel occlusion or high-grade stenosis of the intracranial arteries. 2. Persistent trigeminal artery, normal variant. Electronically Signed   By: Ulyses Jarred M.D.   On: 06/07/2021 19:55   MR Brain W and Wo Contrast  Result Date: 06/07/2021 CLINICAL DATA:  Neuro deficit, acute, stroke suspected. EXAM: MRI HEAD WITHOUT AND WITH CONTRAST TECHNIQUE: Multiplanar, multiecho pulse sequences of the brain and surrounding structures were obtained without and with intravenous contrast. CONTRAST:  58mL GADAVIST GADOBUTROL 1 MMOL/ML IV SOLN COMPARISON:  Head CT June 07, 2021.  MRI of the brain Apr 20, 2021. FINDINGS: Brain: Small area of restricted diffusion extending from the posterior aspect of the right putamen into the corona radiata, consistent with acute/subacute infarct. No hemorrhage, hydrocephalus, extra-axial collection or mass lesion. Remote lacunar infarct in the right cerebellar hemisphere scattered and confluent foci of T2 hyperintensity are seen within the white matter of the cerebral hemispheres and within the pons, similar to prior MRI. No focus of abnormal contrast enhancement. Moderate parenchymal volume loss. Vascular: Normal flow voids. Skull and upper cervical spine: Normal marrow signal. Sinuses/Orbits: Negative. IMPRESSION: 1. Focus of restricted diffusion involving the right putamen extending into the corona radiata consistent with acute/subacute infarct. 2. Moderate chronic white matter lesions, likely a combination of demyelinating disease and chronic microvascular ischemia, unchanged from prior MRI. No focus of abnormal  contrast enhancement to suggest active demyelination. 3. Remote lacunar infarct in the right cerebellar hemisphere. 4. Moderate parenchymal volume loss. Electronically Signed   By: Pedro Earls M.D.   On: 06/07/2021 16:37        Scheduled Meds:   stroke: mapping our early stages of recovery book   Does not apply Once   aspirin EC  81 mg Oral Daily   atorvastatin  80 mg Oral Daily   baclofen  10 mg Oral TID   [START ON 06/09/2021] clopidogrel  75 mg Oral Daily   enoxaparin (LOVENOX) injection  40 mg Subcutaneous Q24H   levothyroxine  25 mcg Oral Q0600   methocarbamol  500 mg Oral TID   Continuous Infusions:   LOS: 0 days      Phillips Climes, MD [facility]   To contact the attending provider between 7A-7P or the covering provider during after hours 7P-7A, please log into the web site [email or web address] and access using universal [place] password for that web site. If you do not have the password, please call the hospital operator.  06/08/2021, 11:40 AM

## 2021-06-08 NOTE — Plan of Care (Signed)
  Problem: Education: Goal: Knowledge of General Education information will improve Description: Including pain rating scale, medication(s)/side effects and non-pharmacologic comfort measures Outcome: Progressing   Problem: Clinical Measurements: Goal: Respiratory complications will improve Outcome: Progressing Goal: Cardiovascular complication will be avoided Outcome: Progressing   

## 2021-06-08 NOTE — Evaluation (Signed)
Occupational Therapy Evaluation Patient Details Name: [name] MRN: [id number] DOB: 08-Mar-1932 Today's Date: 06/08/2021    History of Present Illness 87 yo female presents to [facility] on 6/30 with L weakness, dysphagia; found to have R corona radiata CVA. PMH includes R hip fx 2021 secondary to fall nonop, multiple autoimmune disorders (late onset primary progressive multiple sclerosis, prior diagnosis of seronegative rheumatoid arthritis now felt to be osteoarthritis, temporal arteritis (2010, biopsy-proven, treated with steroids without recurrence)), hypothyroidism, hypertension, hyperlipidemia, coronary artery disease, congestive heart failure (EF 12/2018 60 to 65% with grade 2 diastolic dysfunction), venous insufficiency with chronic edema, osteoporosis.   Clinical Impression   Pt admitted with above and presents to OT with impairments impacting ability to complete ADLs and functional transfers at [facility].  Pt demonstrating impaired sitting balance, max difficulty performing mobility tasks, impaired activity tolerance compared to baseline, LUE weakness.  Pt currently requires max +2 assist for bed mobility and lateral scoot towards head of bed.  Pt demonstrating impaired sitting balance while seated EOB ~10 mins with max cuing for upright posture as pt with L lateral and posterior lean, however able to correct with only cues. Pt able to utilize LUE as gross assist when attempting to wash face, however due to limited AROM unable to reach face with L hand.  At baseline pt had aide for 2 hours in AM and 2 hours in PM to assist with bathing/dressing and then was able to complete toilet transfers to/from w/c independently.  Pt would benefit from further therapy at SNF to maximize functional recovery with self-care and mobility.  Pt will benefit from OT acutely to address impairments in preparation for d/c to SNF.    Follow Up Recommendations  SNF;Supervision/Assistance - 24 hour    Equipment  Recommendations    None recommended by OT   Recommendations for Other Services       Precautions / Restrictions Precautions Precautions: Fall Precaution Comments: L hemiparesis Restrictions Weight Bearing Restrictions: No      Mobility Bed Mobility Overal bed mobility: Needs Assistance Bed Mobility: Supine to Sit;Sit to Supine     Supine to sit: Max assist;+2 for physical assistance Sit to supine: Max assist;+2 for physical assistance   General bed mobility comments: max +2 for trunk and LE management, scooting to/from EOB, boost up in bed with bed pads upon return to supine. Verbal cuing for step-by-step sequencing task.    Transfers Overall transfer level: Needs assistance   Transfers: Lateral/Scoot Transfers          Lateral/Scoot Transfers: Max assist;+2 physical assistance General transfer comment: max +2 for x2 lateral scoots to R for lifting, scooting with bed pads, and righting posture to upright. Pt pushing through RUE on bed to lift buttocks.    Balance Overall balance assessment: Needs assistance;History of Falls Sitting-balance support: Single extremity supported;Feet supported Sitting balance-Leahy Scale: Poor Sitting balance - Comments: L lateral and posterior leaning, requires frequent verbal and demonstrative cues to return to upright sitting. EOB sitting x10 minutes Postural control: Left lateral lean;Posterior lean     Standing balance comment: not tested                           ADL either performed or assessed with clinical judgement   ADL Overall ADL's : Needs assistance/impaired     Grooming: Wash/dry face;Sitting;Min guard  General ADL Comments: Pt reports aide assists with self-care tasks in am and pm, completing bathing at bed level (pt washes perineal area while aide completes remainder of bathing/dressing) at baseline.  Pt requiring intermittent multimodal cues to maintain  sitting balance this session.  Pt able to wash face with RUE, attempting to utilize LUE as gross assist but unable to reach to face with LUE.     Vision   Vision Assessment?: No apparent visual deficits            Pertinent Vitals/Pain Pain Assessment: Faces Faces Pain Scale: Hurts a little bit Pain Location: generalized Pain Descriptors / Indicators: Discomfort Pain Intervention(s): Monitored during session;Repositioned     Hand Dominance Right   Extremity/Trunk Assessment Upper Extremity Assessment Upper Extremity Assessment: LUE deficits/detail LUE Deficits / Details: requires hand under hand assist for majority of movement against gravity with ability to straighten elbow from 90* to neutral.  MMT limited due to tangential speech with difficulty remaining on task LUE Sensation: decreased light touch LUE Coordination: decreased fine motor;decreased gross motor   Lower Extremity Assessment Lower Extremity Assessment: LLE deficits/detail;RLE deficits/detail RLE Deficits / Details: bilat knee flexion contractures lacking ~25* extension bilat; hip adduction and flexion hypertonicty. MMT testing limited by this, but pt able to perform SAQ sitting EOB LLE Deficits / Details: bilat knee flexion contractures lacking ~25* extension bilat; hip adduction and flexion hypertonicty. MMT testing limited by this, but pt able to perform SAQ sitting EOB; Pt endorses L feels weaker than R post-CVA LLE Sensation: decreased light touch LLE Coordination: decreased fine motor;decreased gross motor   Cervical / Trunk Assessment Cervical / Trunk Assessment: Normal   Communication Communication Communication: HOH;Other (comment) (slow, dysarthric speech)   Cognition Arousal/Alertness: Awake/alert Behavior During Therapy: WFL for tasks assessed/performed Overall Cognitive Status: No family/caregiver present to determine baseline cognitive functioning                                  General Comments: Pt A&Ox3, did not assess pt orientation to time. Pt is tangential and difficult to keep on task, follows one step commands with multimodal cues   General Comments  vss            Home Living Family/patient expects to be discharged to:: Private residence Living Arrangements: Alone Available Help at Discharge: Personal care attendant;Available PRN/intermittently (aide 7-9am and 7-9 pm to assist with self-care tasks) Type of Home: House Home Access: Ramped entrance     Home Layout: Able to live on main level with bedroom/bathroom     Bathroom Shower/Tub:  (washes up in bed)         Home Equipment: Hospital bed;Bedside commode;Wheelchair - power;Wheelchair - manual;Grab bars - toilet      Lives With: Alone    Prior Functioning/Environment Level of Independence: Needs assistance  Gait / Transfers Assistance Needed: pt reports transferring without assist PTA, states she would "mostly" make sure the wheelchair was locked before transferring. has not WB through RLE in over a year since fracture ADL's / Homemaking Assistance Needed: PTA pt requiring washup assist (pt able to wash periarea), does not get into shower; was transferring herself w/c <> toilet by "wiggling" from one to the other            OT Problem List: Decreased strength;Decreased range of motion;Impaired balance (sitting and/or standing);Decreased coordination;Decreased safety awareness;Decreased knowledge of use of DME or AE;Impaired tone;Impaired sensation;Impaired  UE functional use;Pain      OT Treatment/Interventions: Self-care/ADL training;Neuromuscular education;DME and/or AE instruction;Therapeutic activities;Patient/family education;Balance training    OT Goals(Current goals can be found in the care plan section) Acute Rehab OT Goals Patient Stated Goal: agreeable to ST-SNF OT Goal Formulation: With patient Time For Goal Achievement: 06/22/21 Potential to Achieve Goals: Good  OT  Frequency: Min 2X/week   Barriers to D/C: Decreased caregiver support  pt has aide 7-9 am and 7-9 pm       Co-evaluation PT/OT/SLP Co-Evaluation/Treatment: Yes Reason for Co-Treatment: For patient/therapist safety;To address functional/ADL transfers;Complexity of the patient's impairments (multi-system involvement) PT goals addressed during session: Mobility/safety with mobility;Balance;Strengthening/ROM OT goals addressed during session: ADL's and self-care      AM-PAC OT "6 Clicks" Daily Activity     Outcome Measure Help from another person eating meals?: A Lot Help from another person taking care of personal grooming?: A Little Help from another person toileting, which includes using toliet, bedpan, or urinal?: Total Help from another person bathing (including washing, rinsing, drying)?: Total Help from another person to put on and taking off regular upper body clothing?: Total Help from another person to put on and taking off regular lower body clothing?: Total 6 Click Score: 9   End of Session Nurse Communication: Mobility status  Activity Tolerance: Patient tolerated treatment well Patient left: in bed;with call bell/phone within reach;with bed alarm set  OT Visit Diagnosis: History of falling (Z91.81);Unsteadiness on feet (R26.81);Other symptoms and signs involving the nervous system (R29.898);Hemiplegia and hemiparesis Hemiplegia - Right/Left: Left Hemiplegia - dominant/non-dominant: Non-Dominant Hemiplegia - caused by: Cerebral infarction                Time: 1901-2224 OT Time Calculation (min): 37 min Charges:  OT General Charges $OT Visit: 1 Visit OT Evaluation $OT Eval Moderate Complexity: [place], [place] 06/08/2021, 1:11 PM

## 2021-06-08 NOTE — Evaluation (Signed)
Physical Therapy Evaluation Patient Details Name: [name] MRN: [id number] DOB: [birth year] Today's Date: 06/08/2021   History of Present Illness  87 yo female presents to [facility] on 6/30 with L weakness, dysphagia; found to have R corona radiata CVA. PMH includes R hip fx 2021 secondary to fall nonop, multiple autoimmune disorders (late onset primary progressive multiple sclerosis, prior diagnosis of seronegative rheumatoid arthritis now felt to be osteoarthritis, temporal arteritis (2010, biopsy-proven, treated with steroids without recurrence)), hypothyroidism, hypertension, hyperlipidemia, coronary artery disease, congestive heart failure (EF 12/2018 60 to 65% with grade 2 diastolic dysfunction), venous insufficiency with chronic edema, osteoporosis.  Clinical Impression  Pt presents with impaired strength L>R, bilat LE hypertonicity with knee flexion contractures, impaired sitting balance, max difficulty performing mobility tasks, impaired activity tolerance vs baseline.  Pt to benefit from acute PT to address deficits. Pt requiring max +2 assist for moving to/from EOB, and tolerated EOB sitting X~10 minutes with max cuing for upright posture as pt with L lateral and posterior leaning preference. At baseline, pt able to transfer to/from w/c independently, PT recommending ST-SNF for maximizing functional recovery post-cva. PT to progress mobility as tolerated, and will continue to follow acutely.      Follow Up Recommendations SNF;Supervision/Assistance - 24 hour    Equipment Recommendations  None recommended by PT    Recommendations for Other Services       Precautions / Restrictions Precautions Precautions: Fall Precaution Comments: L hemiparesis Restrictions Weight Bearing Restrictions: No      Mobility  Bed Mobility Overal bed mobility: Needs Assistance Bed Mobility: Supine to Sit;Sit to Supine     Supine to sit: Max assist;+2 for physical assistance Sit to supine:  Max assist;+2 for physical assistance   General bed mobility comments: max +2 for trunk and LE management, scooting to/from EOB, boost up in bed with bed pads upon return to supine. Verbal cuing for step-by-step sequencing task.    Transfers Overall transfer level: Needs assistance   Transfers: Lateral/Scoot Transfers          Lateral/Scoot Transfers: Max assist;+2 physical assistance General transfer comment: max +2 for x2 lateral scoots to R for lifting, scooting with bed pads, and righting posture to upright. Pt pushing through RUE on bed to lift buttocks.  Ambulation/Gait             General Gait Details: does not ambulate  Stairs            Wheelchair Mobility    Modified Rankin (Stroke Patients Only) Modified Rankin (Stroke Patients Only) Pre-Morbid Rankin Score: Moderately severe disability Modified Rankin: Severe disability     Balance Overall balance assessment: Needs assistance;History of Falls Sitting-balance support: Single extremity supported;Feet supported Sitting balance-Leahy Scale: Poor Sitting balance - Comments: L lateral and posterior leaning, requires frequent verbal and demonstrative cues to return to upright sitting. EOB sitting x10 minutes Postural control: Left lateral lean;Posterior lean     Standing balance comment: nt                             Pertinent Vitals/Pain Pain Assessment: Faces Faces Pain Scale: Hurts a little bit Pain Location: generalized Pain Descriptors / Indicators: Discomfort Pain Intervention(s): Monitored during session    Home Living Family/patient expects to be discharged to:: Private residence Living Arrangements: Alone Available Help at Discharge: Personal care attendant;Available PRN/intermittently (aide 7-9 am, 7-9 pm) Type of Home: House Home Access: Ramped entrance  Home Layout: Able to live on main level with bedroom/bathroom Home Equipment: Hospital bed;Bedside  commode;Wheelchair - power;Wheelchair - manual;Grab bars - toilet      Prior Function Level of Independence: Needs assistance   Gait / Transfers Assistance Needed: pt reports transferring without assist PTA, states she would "mostly" make sure the wheelchair was locked before transferring. has not WB through RLE in over a year since fracture  ADL's / Homemaking Assistance Needed: PTA pt requiring washup assist (pt able to wash periarea), does not get into shower        Hand Dominance   Dominant Hand: Right    Extremity/Trunk Assessment   Upper Extremity Assessment Upper Extremity Assessment: Defer to OT evaluation    Lower Extremity Assessment Lower Extremity Assessment: LLE deficits/detail;RLE deficits/detail RLE Deficits / Details: bilat knee flexion contractures lacking ~25* extension bilat; hip adduction and flexion hypertonicty. MMT testing limited by this, but pt able to perform SAQ sitting EOB LLE Deficits / Details: bilat knee flexion contractures lacking ~25* extension bilat; hip adduction and flexion hypertonicty. MMT testing limited by this, but pt able to perform SAQ sitting EOB; Pt endorses L feels weaker than R post-CVA LLE Sensation: decreased light touch LLE Coordination: decreased fine motor;decreased gross motor    Cervical / Trunk Assessment Cervical / Trunk Assessment: Normal  Communication   Communication: HOH;Other (comment) (slow, dysarthric speech)  Cognition Arousal/Alertness: Awake/alert Behavior During Therapy: WFL for tasks assessed/performed Overall Cognitive Status: No family/caregiver present to determine baseline cognitive functioning                                 General Comments: Pt A&Ox3, did not assess pt orientation to time. Pt is tangential and difficult to keep on task, follows one step commands with multimodal cues      General Comments General comments (skin integrity, edema, etc.): vss    Exercises      Assessment/Plan    PT Assessment Patient needs continued PT services  PT Problem List Decreased strength;Decreased mobility;Decreased safety awareness;Impaired tone;Decreased range of motion;Decreased coordination;Decreased activity tolerance;Decreased cognition;Decreased balance;Decreased knowledge of use of DME;Pain;Impaired sensation       PT Treatment Interventions DME instruction;Therapeutic activities;Therapeutic exercise;Patient/family education;Balance training;Functional mobility training;Neuromuscular re-education    PT Goals (Current goals can be found in the Care Plan section)  Acute Rehab PT Goals Patient Stated Goal: agreeable to ST-SNF PT Goal Formulation: With patient Time For Goal Achievement: 06/22/21 Potential to Achieve Goals: Good    Frequency Min 3X/week   Barriers to discharge        Co-evaluation PT/OT/SLP Co-Evaluation/Treatment: Yes Reason for Co-Treatment: For patient/therapist safety;To address functional/ADL transfers;Complexity of the patient's impairments (multi-system involvement) PT goals addressed during session: Mobility/safety with mobility;Balance;Strengthening/ROM         AM-PAC PT "6 Clicks" Mobility  Outcome Measure Help needed turning from your back to your side while in a flat bed without using bedrails?: A Lot Help needed moving from lying on your back to sitting on the side of a flat bed without using bedrails?: A Lot Help needed moving to and from a bed to a chair (including a wheelchair)?: Total Help needed standing up from a chair using your arms (e.g., wheelchair or bedside chair)?: Total Help needed to walk in hospital room?: Total Help needed climbing 3-5 steps with a railing? : Total 6 Click Score: 8    End of Session   Activity Tolerance:  Patient tolerated treatment well;Patient limited by fatigue Patient left: in bed;with call bell/phone within reach;with bed alarm set;with SCD's reapplied Nurse Communication:  Mobility status PT Visit Diagnosis: Other abnormalities of gait and mobility (R26.89);Difficulty in walking, not elsewhere classified (R26.2);Hemiplegia and hemiparesis Hemiplegia - Right/Left: Left Hemiplegia - dominant/non-dominant: Non-dominant Hemiplegia - caused by: Cerebral infarction    Time: 1660-6004 PT Time Calculation (min) (ACUTE ONLY): 37 min   Charges:   PT Evaluation $PT Eval Moderate Complexity: 1 Mod        Seydina Holliman S, PT DPT Acute Rehabilitation Services Pager [phone]  Office [phone]   [place] Ruffin Pyo 06/08/2021, 11:50 AM

## 2021-06-08 NOTE — Progress Notes (Signed)
  Echocardiogram 2D Echocardiogram has been performed.  [name] 06/08/2021, 4:36 PM

## 2021-06-08 NOTE — Progress Notes (Signed)
  Speech Language Pathology Treatment: Dysphagia  Patient Details Name: [name] MRN: [id number] DOB: 1932-01-20 Today's Date: 06/08/2021 Time: 4680-3212 SLP Time Calculation (min) (ACUTE ONLY): 36 min  Assessment / Plan / Recommendation Clinical Impression  Pt was seen for dysphagia treatment with her daughter and son present. Pt and her family were educated regarding the results of the modified barium swallow study, diet recommendations, swallowing precautions, and pulmonary complications associated with aspiration. Pt's family was also educated on the need for thickener, plan of care, symptoms of aspiration, and dysphagia intervention. Video recording of the study was used to facilitate education and all parties verbalized understanding regarding all areas of education. All of the pt's and her family's questions were answered to their satisfaction. Pt was seen during part of dinner and she tolerated puree solids and nectar thick liquids via straw without overt s/sx of aspiration. Pt required verbal and tactile cues to avoid use of a chin tuck posture which facilitated increased penetration during the study, but this did not result in any signs of aspiration during the today's session. Anterior spillage was noted toward the left and pt verbalized agreement with directing food/liquid to the right to compensate for this. Pt's family demonstrated understanding regarding swallowing precautions and were noted to cue pt upon SLP's departure. SLP will continue to follow pt.    HPI HPI: Pt is an 87 y.o. female who presented with left-sided weakness, facial droop, and slurred speech. MRI brain: Focus of restricted diffusion involving the right putamen extending into the corona radiata consistent with acute/subacute infarct. Pt failed the [place] since she stopped drinking. PMH: multiple sclerosis, hypothyroidism, hypertension, hyperlipidemia, rheumatoid arthritis, giant cell arteritis, falls, CHF,  venous insufficiency, chronic edema, osteoporosis, acetabular fracture.      SLP Plan  Continue with current plan of care       Recommendations  Diet recommendations: Dysphagia 1 (puree);Nectar-thick liquid Liquids provided via: Cup;Straw Medication Administration: Crushed with puree Supervision: Full supervision/cueing for compensatory strategies;Staff to assist with self feeding Compensations: Slow rate;Small sips/bites;Follow solids with liquid (left head turn) Postural Changes and/or Swallow Maneuvers: Seated upright 90 degrees;Upright 30-60 min after meal;Head turn left during swallow                Oral Care Recommendations: Oral care BID Follow up Recommendations: Skilled Nursing facility SLP Visit Diagnosis: Dysphagia, oropharyngeal phase (R13.12) Plan: Continue with current plan of care       Tayson Schnelle I. Hardin Negus, [place], [place] Office number [phone] Pager [place] 06/08/2021, 5:38 PM

## 2021-06-08 NOTE — Progress Notes (Signed)
  Speech Language Pathology Treatment:    Patient Details Name: [name] MRN: [id number] DOB: 14-Jun-1932 Today's Date: 06/08/2021 Time: 6734-1937 SLP Time Calculation (min) (ACUTE ONLY): 14 min  Assessment / Plan / Recommendation Clinical Impression  Pt was seen for dysarthria treatment and was cooperative during the session. She was educated regarding the nature of dysarthria, and compensatory strategies to improve speech intelligibility. Pt verbalized understanding regarding all areas of education. She used compensatory strategies at the word level with 100% accuracy after initial instruction. During structured phrase-production tasks, she demonstrated 70% accuracy increasing to 100% with prompts for overarticulation. She achieved 50% accuracy during less structured activities. SLP will continue to follow pt.    HPI HPI: Pt is an 87 y.o. female who presented with left-sided weakness, facial droop, and slurred speech. MRI brain: Focus of restricted diffusion involving the right putamen extending into the corona radiata consistent with acute/subacute infarct. Pt failed the [place] since she stopped drinking. PMH: multiple sclerosis, hypothyroidism, hypertension, hyperlipidemia, rheumatoid arthritis, giant cell arteritis, falls, CHF, venous insufficiency, chronic edema, osteoporosis, acetabular fracture.      SLP Plan     Patient needs continued Speech Lanaguage Pathology Services    Recommendations                   Oral Care Recommendations: Oral care QID;Staff/trained caregiver to provide oral care Follow up Recommendations: Inpatient Rehab SLP Visit Diagnosis: Dysarthria and anarthria (R47.1)       [name] I. Hardin Negus, [place], [place] Office number [phone] Pager [place] 06/08/2021, 10:10 AM

## 2021-06-08 NOTE — Progress Notes (Signed)
Initial Nutrition Assessment  DOCUMENTATION CODES:   Not applicable  INTERVENTION:  Provide Ensure Enlive po BID (thickened to nectar thick consistency), each supplement provides 350 kcal and 20 grams of protein.  Provide Magic cup TID with meals, each supplement provides 290 kcal and 9 grams of protein.  If pt with poor PO over the weekend, recommend Cortrak placement Tuesday (7/5) for enteral nutrition: Recommend Osmolite 1.5 cal formula at 25 ml/hr and increase by 10 ml every 4 hours to goal rate of 45 ml/hr with 45 ml Prosource TF BID. Tube feeding to provide 1700 kcal, 82 grams of protein, and 821 ml free water.   NUTRITION DIAGNOSIS:   Increased nutrient needs related to chronic illness (CHF) as evidenced by estimated needs.  GOAL:   Patient will meet greater than or equal to 90% of their needs  MONITOR:   PO intake, Supplement acceptance, Skin, Weight trends, Labs, I & O's, Diet advancement  REASON FOR ASSESSMENT:   Consult  (potential Cortrak TF)  ASSESSMENT:   87 y.o. female with medical history significant of multiple sclerosis, hypothyroidism, hypertension, hyperlipidemia, rheumatoid arthritis, giant cell arteritis, falls, CHF, venous insufficiency, chronic edema, osteoporosis, acetabular fracture presents with new onset weakness. Patient presents with left-sided weakness, facial droop and slurred speech, MRI brain significant for acute/subacute infarct and right putamen extending into the corona radiata  Pt underwent MBS today. Pt with oropharyngeal dysphagia characterized by weak bolus manipulation, reduced labial seal, reduced posterior bolus propulsion, reduced lingual retraction, and reduced anterior laryngeal movement. SLP recommended dysphagia 1 diet with nectar thick liquids. If pt with poor po over the weekend will plan for Cortrak NGT placement on Tuesday for enteral nutrition. Tube feeding recommendations stated above if warranted.   RD to order Ensure and  magic cup to aid in PO. Pt reports hunger at time of visit. Pt has caretaker at home who cooks for pt. She reports usually eating 3 meals a day with no difficulties. Pt motivated to eat her food at meals  NUTRITION - FOCUSED PHYSICAL EXAM:  Flowsheet Row Most Recent Value  Orbital Region Unable to assess  Upper Arm Region No depletion  Thoracic and Lumbar Region Unable to assess  Buccal Region No depletion  Temple Region Moderate depletion  Clavicle Bone Region Moderate depletion  Clavicle and Acromion Bone Region Moderate depletion  Scapular Bone Region Unable to assess  Dorsal Hand Moderate depletion  Patellar Region Unable to assess  Anterior Thigh Region Mild depletion  Posterior Calf Region Unable to assess  Edema (RD Assessment) None  Hair Reviewed  Eyes Reviewed  Mouth Reviewed  Skin Reviewed  Nails Reviewed      Labs and medications reviewed.   Diet Order:   Diet Order             DIET - DYS 1 Room service appropriate? Yes with Assist; Fluid consistency: Nectar Thick  Diet effective now                   EDUCATION NEEDS:   Not appropriate for education at this time  Skin:  Skin Assessment: Reviewed RN Assessment  Last BM:  Unknown  Height:   Ht Readings from Last 1 Encounters:  06/07/21 5\' 5"  (1.651 m)    Weight:   Wt Readings from Last 1 Encounters:  06/07/21 54.4 kg    Ideal Body Weight:  56.8 kg  BMI:  Body mass index is 19.97 kg/m.  Estimated Nutritional Needs:   Kcal:  1700-1850  Protein:  80-95 grams  Fluid:  >/= 1.7 L/day  Corrin Parker, MS, RD, LDN RD pager number/after hours weekend pager number on Amion.

## 2021-06-08 NOTE — Evaluation (Signed)
Clinical/Bedside Swallow Evaluation Patient Details  Name: [name] MRN: [id number] Date of Birth: 09-18-1932  Today's Date: 06/08/2021 Time: SLP Start Time (ACUTE ONLY): 4496 SLP Stop Time (ACUTE ONLY): 0907 SLP Time Calculation (min) (ACUTE ONLY): 23 min  Past Medical History:  Past Medical History:  Diagnosis Date   Chronic venous insufficiency of lower extremity 01/25/2019   With chronic bilateral lower extremity edema   Incontinent of urine    Multiple sclerosis ([place]) 2015   Osteoporosis    osteopenia   Polyarthritis    Post-menopausal    Rheumatoid arthritis ([place])    Temporal arteritis ([place]) 2011   Past Surgical History:  Past Surgical History:  Procedure Laterality Date   ABDOMINAL HYSTERECTOMY  1971   TVH   breast ductectomy Right 05/1995   COLONOSCOPY  10/2011   HPI:  Pt is an 87 y.o. female who presented with left-sided weakness, facial droop, and slurred speech. MRI brain: Focus of restricted diffusion involving the right putamen extending into the corona radiata consistent with acute/subacute infarct. Pt failed the [place] since she stopped drinking. PMH: multiple sclerosis, hypothyroidism, hypertension, hyperlipidemia, rheumatoid arthritis, giant cell arteritis, falls, CHF, venous insufficiency, chronic edema, osteoporosis, acetabular fracture.   Assessment / Plan / Recommendation Clinical Impression  Pt was seen for bedside swallow evaluation and she denied a history of dysphagia. Oral mechanism exam was significant for facial and hypoglossal nerve dysfunction with reduced left-side facial ROM and lingual strength. Dentition was natural and adequate. Pt demonstrated symptoms of oropharyngeal dysphagia characterized by anterior spillage, prolonged mastication, multiple swallows, and signs of aspiration with all trials except ice chips. A modified barium swallow study is recommended to further assess swallow function. It is scheduled for 1300 and it is  recommended that the pt's NPO status be maintained until the study is completed. Pt may have individual ice chips and critical meds may be crushed and given in small (i.e., less than half-tsp) boluses of puree. SLP Visit Diagnosis: Dysarthria and anarthria (R47.1)    Aspiration Risk  Moderate aspiration risk    Diet Recommendation NPO   Postural Changes: Seated upright at 90 degrees    Other  Recommendations Oral Care Recommendations: Oral care QID;Staff/trained caregiver to provide oral care   Follow up Recommendations Inpatient Rehab      Frequency and Duration min 2x/week  2 weeks       Prognosis Prognosis for Safe Diet Advancement: Good Barriers to Reach Goals: Severity of deficits      Swallow Study   General Date of Onset: 06/07/21 HPI: Pt is an 87 y.o. female who presented with left-sided weakness, facial droop, and slurred speech. MRI brain: Focus of restricted diffusion involving the right putamen extending into the corona radiata consistent with acute/subacute infarct. Pt failed the [place] since she stopped drinking. PMH: multiple sclerosis, hypothyroidism, hypertension, hyperlipidemia, rheumatoid arthritis, giant cell arteritis, falls, CHF, venous insufficiency, chronic edema, osteoporosis, acetabular fracture. Type of Study: Bedside Swallow Evaluation Previous Swallow Assessment: none Diet Prior to this Study: NPO Temperature Spikes Noted: No Respiratory Status: Room air History of Recent Intubation: No Behavior/Cognition: Alert;Cooperative;Pleasant mood Oral Cavity Assessment: Within Functional Limits Oral Care Completed by SLP: No Oral Cavity - Dentition: Adequate natural dentition Vision: Functional for self-feeding Self-Feeding Abilities: Able to feed self Patient Positioning: Upright in bed;Postural control adequate for testing Baseline Vocal Quality: Normal;Low vocal intensity Volitional Cough: Strong Volitional Swallow: Able to elicit     Oral/Motor/Sensory Function Overall Oral Motor/Sensory Function: Mild impairment Facial  ROM: Reduced left;Suspected CN VII (facial) dysfunction Facial Symmetry: Abnormal symmetry left;Suspected CN VII (facial) dysfunction Facial Strength: Reduced left;Suspected CN VII (facial) dysfunction Facial Sensation: Within Functional Limits Lingual ROM: Within Functional Limits Lingual Symmetry: Within Functional Limits Lingual Strength: Reduced;Suspected CN XII (hypoglossal) dysfunction Lingual Sensation: Within Functional Limits Velum:  (possible uvulectomy) Mandible: Within Functional Limits   Ice Chips Ice chips: Impaired Presentation: Cup Oral Phase Impairments: Reduced labial seal Oral Phase Functional Implications: Left anterior spillage   Thin Liquid Thin Liquid: Impaired Presentation: Straw;Cup Oral Phase Impairments: Reduced labial seal Oral Phase Functional Implications: Left anterior spillage Pharyngeal  Phase Impairments: Throat Clearing - Immediate;Cough - Immediate;Multiple swallows    Nectar Thick Nectar Thick Liquid: Not tested   Honey Thick Honey Thick Liquid: Not tested   Puree Puree: Impaired Presentation: Spoon Oral Phase Impairments: Reduced labial seal Oral Phase Functional Implications: Left anterior spillage Pharyngeal Phase Impairments: Multiple swallows;Throat Clearing - Delayed Other Comments: Minimized with < half-tsp boluses   Solid     Solid: Impaired Presentation: Self Fed Oral Phase Impairments: Reduced labial seal;Impaired mastication Oral Phase Functional Implications: Impaired mastication Pharyngeal Phase Impairments: Multiple swallows;Throat Clearing - Immediate;Cough - Delayed     [name], [place], [place] Office number [phone] Pager [phone]  Horton Marshall 06/08/2021,9:53 AM

## 2021-06-08 NOTE — Progress Notes (Addendum)
STROKE TEAM PROGRESS NOTE   ATTENDING NOTE: I reviewed above note and agree with the assessment and plan. Pt was seen and examined.   87 year old female with history of MS follow with Dr. Rachel Moulds at [facility], biopsy-proven temporal arteritis in 2010 treated with steroids without recurrence, hypertension, hyperlipidemia, CAD, CHF, right hip fracture 03/2020 currently not weightbearing in wheelchair admitted for left-sided weakness.  CT negative for acute finding.  MRI showed right CR infarct.  CTA head and neck unremarkable.  EF 60 to 65%.  LDL 156, creatinine 0.60.  A1c pending  On exam, patient daughter and son at bedside, patient sitting in bed, awake alert, orientated to place and people, however not to age.  No aphasia, fluent language but bradyphonia, able to name and repeat, follow simple commands.  Left gaze incomplete, right gaze intact, visual field full, left facial droop.  Left upper extremity 3/5 proximal and distal, right upper extremity at least 4/5 proximal and distal.  Bilateral lower extremity proximal 0/5, distal ankle DF left 0/5, right 2/5.  Sensation decreased around left lower extremity, finger-to-nose intact on the right.  Etiology for patient stroke likely due to small vessel disease given location and risk factors.  We will start aspirin 81 and Plavix 75 DAPT for 3 weeks and then aspirin alone.  On Lipitor 80 for hyperlipidemia, continue on discharge.  Patient had longstanding MS follow-up with Dr. Felecia Shelling, she was continue follow-up with Dr. Felecia Shelling at [facility].  For detailed assessment and plan, please refer to below as I have made changes wherever appropriate.   Neurology will sign off. Please call with questions. Pt will follow up with Dr. Felecia Shelling at [facility] in about 4 weeks. Thanks for the consult.   Gabriela Hawking, MD PhD Stroke Neurology 06/08/2021 7:03 PM   Interval History   No acute events overnight, she is resting in bed. Educated about stroke, stroke risk factors need to complete  stroke work up with TTE.   No family at bedside.   Pertinent Lab Work and Imaging    06/07/21 CT Head WO IV Contrast 1. No evidence of acute large vascular territory infarct or acute hemorrhage. 2. Moderate patchy white matter hypoattenuation, which could represent chronic microvascular ischemic disease and/or demyelination in this patient with a history of multiple sclerosis.  06/07/21 CT Angio Head and Neck W WO IV Contrast 1. No emergent large vessel occlusion or high-grade stenosis of the intracranial arteries. 2. Persistent trigeminal artery, normal variant.   06/07/21 MRI Brain W WO IV Contrast 1. Focus of restricted diffusion involving the right putamen extending into the corona radiata consistent with acute/subacute infarct. 2. Moderate chronic white matter lesions, likely a combination of demyelinating disease and chronic microvascular ischemia, unchanged from prior MRI. No focus of abnormal contrast enhancement to suggest active demyelination. 3. Remote lacunar infarct in the right cerebellar hemisphere. 4. Moderate parenchymal volume loss.  06/08/21 Echocardiogram Complete  Pending   Physical Examination   Constitutional: Calm, appropriate for condition  Cardiovascular: Normal RR Respiratory: No increased WOB   Mental status: AAOx4, following commands  Speech: Fluent, repetition and naming intact, dysarthric  Cranial nerves: EOMI, VFF, Left facial weakness, Tongue midline  Motor: Atrophy throughout. Contractures to LUE and LLE. Pronator drift to the left arm.  Dlt Bic Tri FgS Grp HF  KnF KnE PIF DoF  R 4 5 5 5 5 2 2 2 3 3   L 5 5 5 5 5 2 2 2  4- 2  Sensory: Decreased to light  touch to the left leg  Coordination: FNF intact RUE difficulty reaching face with LUE due to weakness  Gait: Deferred given BLE weakness   Assessment and Plan   [name] is a 87 y.o. female w/pmh of multiple sclerosis ( late onset primary progressive), osteoarthritis, temporal  arteritis, hypothyroidism, hypertension, hyperlipidemia, CAD, CHF, osteoporosis who presents with sudden onset left sided weakness. She was outside of the time window for IVTPA,   [employer] Stroke  Patient presented with the symptoms described above. At this time, her stroke work up is essentially complete. MRI Brain showed a right corona radiata stroke. CTA Head and Neck did not reveal significant stenosis. Echo pending. Stroke labs w/LDL 156, hemoglobin a1c pending. More than likely her stroke is in the setting of SVD due to HTN, HLD.  -If she passes her swallow eval or has a NGT placed recommend to continue DAPT for 21 days followed by Aspirin monotherapy, in the interim, started ASA 300 mg QD rectal until she can take PO aspirin, primary team can discontinue when she has a NGT or has passed swallow   -Continue Atorvastatin 80 mg QD ( started this admission given LDL is not at goal)  -Continue to follow with her neurologist, Dr. Felecia Shelling at discharge   #Hypertension She has a history of HTN however does not appear to be taking any anti hypertensive medications at home. Currently blood pressure is trending normotensive with fluctuations to the 140-160 range. Recommend permissive hypertension 48 hours post stroke and from there, gradually reduce the blood pressure, avoiding any acute drops. Long term blood pressure goal is < 140/90. - Primary team to initiate anti hypertensive therapy as indicated when she is out the permissive hypertensive window   #Hyperlipidemia From a stroke prevention stand point, the LDL goal is < 70. LDL 156, Atorvastatin 80 mg QD started this admission.   #Stroke Dysphagia Screening  Needs to pass bedside swallow eval, ST has seen her and recommend to obtain an MBS   #Stroke Diabetes Screening  Hemoglobin A1C pending   Hospital day # 0  Ruta Hinds, NP  [facility] Nurse Practitioner Patient seen and discussed with attending physician Dr. Erlinda Hong        To contact Stroke Continuity provider, please refer to [email or web address] After hours, contact General Neurology

## 2021-06-09 LAB — CBC
HCT: 36.2 % (ref 36.0–46.0)
Hemoglobin: 12.5 g/dL (ref 12.0–15.0)
MCH: 31.7 pg (ref 26.0–34.0)
MCHC: 34.5 g/dL (ref 30.0–36.0)
MCV: 91.9 fL (ref 80.0–100.0)
Platelets: 180 10*3/uL (ref 150–400)
RBC: 3.94 MIL/uL (ref 3.87–5.11)
RDW: 12.9 % (ref 11.5–15.5)
WBC: 7 10*3/uL (ref 4.0–10.5)
nRBC: 0 % (ref 0.0–0.2)

## 2021-06-09 LAB — BASIC METABOLIC PANEL
Anion gap: 10 (ref 5–15)
BUN: 14 mg/dL (ref 8–23)
CO2: 23 mmol/L (ref 22–32)
Calcium: 8.5 mg/dL — ABNORMAL LOW (ref 8.9–10.3)
Chloride: 101 mmol/L (ref 98–111)
Creatinine, Ser: 0.69 mg/dL (ref 0.44–1.00)
GFR, Estimated: >60 mL/min (ref >60)
Glucose, Bld: 86 mg/dL (ref 70–99)
Potassium: 3.6 mmol/L (ref 3.5–5.1)
Sodium: 134 mmol/L — ABNORMAL LOW (ref 135–145)

## 2021-06-09 LAB — HEMOGLOBIN A1C
Hgb A1c MFr Bld: 5.4 % (ref 4.8–5.6)
Mean Plasma Glucose: 108 mg/dL

## 2021-06-09 MED ORDER — PANTOPRAZOLE SODIUM 40 MG PO TBEC
40.0000 mg | DELAYED_RELEASE_TABLET | Freq: Every day | ORAL | Status: DC
  Administered 2021-06-09 – 2021-06-12 (×4): 40 mg via ORAL
  Filled 2021-06-09 (×4): qty 1

## 2021-06-09 MED ORDER — SODIUM CHLORIDE 0.9 % IV SOLN: INTRAVENOUS | Status: DC

## 2021-06-09 NOTE — Progress Notes (Signed)
Physical Therapy Treatment Patient Details Name: [name] MRN: [id number] DOB: 07/05/1932 Today's Date: 06/09/2021    History of Present Illness 87 yo female presents [facility] on [facility]/30 with L weakness, dysphagia; found to have R corona radiata CVA. PMH includes R hip fx 2021 secondary to fall nonop, multiple autoimmune disorders (late onset primary progressive multiple sclerosis, prior diagnosis of seronegative rheumatoid arthritis now felt to be osteoarthritis, temporal arteritis (2010, biopsy-proven, treated with steroids without recurrence)), hypothyroidism, hypertension, hyperlipidemia, coronary artery disease, congestive heart failure (EF 12/2018 60 to 65% with grade 2 diastolic dysfunction), venous insufficiency with chronic edema, osteoporosis.    PT Comments    Pt progressing towards her physical therapy goals; demonstrates improved LUE strength and sitting balance this session. Able to self feed with set up assist. Pt requiring two person maximal assist for lateral scoot transfers from bed to chair. Would recommend hoyer lift for transfers out of bed at home, unless +2 assist available. Answer pt/pt son questions and concerns. Will continue to follow acutely.    Follow Up Recommendations  Home health PT;Supervision/Assistance - 24 hour (declining SNF)     Equipment Recommendations   Harrel Lemon lift)    Recommendations for Other Services       Precautions / Restrictions Precautions Precautions: Fall;Other (comment) Precaution Comments: L hemiplegia, bilateral knee contractures Restrictions Weight Bearing Restrictions: No    Mobility  Bed Mobility Overal bed mobility: Needs Assistance Bed Mobility: Supine to Sit     Supine to sit: Max assist     General bed mobility comments: Pt minimally helps with BLE's to edge of bed, cues for use of rail with RUE, exiting towards left side of the bed. Assist at trunk to bring up to sitting position    Transfers Overall  transfer level: Needs assistance Equipment used: None Transfers: Lateral/Scoot Transfers          Lateral/Scoot Transfers: Max assist;+2 physical assistance General transfer comment: MaxA + 2 for lateral scoot transfer towards right from bed to chair, use of bed pad to guide hips, cues for hand placement  Ambulation/Gait                 Stairs             Wheelchair Mobility    Modified Rankin (Stroke Patients Only) Modified Rankin (Stroke Patients Only) Pre-Morbid Rankin Score: Severe disability Modified Rankin: Severe disability     Balance Overall balance assessment: Needs assistance Sitting-balance support: Feet supported;Single extremity supported Sitting balance-Leahy Scale: Poor Sitting balance - Comments: reliant on single UE support, able to achieve midline, intermittent L lateral lean. Requiring min guard assist                                    Cognition Arousal/Alertness: Awake/alert Behavior During Therapy: WFL for tasks assessed/performed Overall Cognitive Status: Impaired/Different from baseline Area of Impairment: Problem solving                             Problem Solving: Slow processing;Difficulty sequencing;Requires verbal cues        Exercises General Exercises - Upper Extremity Shoulder Flexion: AAROM;Left;5 reps;Seated    General Comments        Pertinent Vitals/Pain Pain Assessment: Faces Faces Pain Scale: Hurts a little bit Pain Location: R hip Pain Descriptors / Indicators: Discomfort;Grimacing Pain Intervention(s): Limited activity  within patient's tolerance;Monitored during session;Other (comment) (RN applying Voltaren)    Home Living                      Prior Function            PT Goals (current goals can now be found in the care plan section) Acute Rehab PT Goals Patient Stated Goal: go home Potential to Achieve Goals: Fair    Frequency    Min 3X/week      PT  Plan Discharge plan needs to be updated    Co-evaluation              AM-PAC PT "6 Clicks" Mobility   Outcome Measure  Help needed turning from your back to your side while in a flat bed without using bedrails?: A Lot Help needed moving from lying on your back to sitting on the side of a flat bed without using bedrails?: A Lot Help needed moving to and from a bed to a chair (including a wheelchair)?: Total Help needed standing up from a chair using your arms (e.g., wheelchair or bedside chair)?: Total Help needed to walk in hospital room?: Total Help needed climbing 3-5 steps with a railing? : Total 6 Click Score: 8    End of Session Equipment Utilized During Treatment: Gait belt Activity Tolerance: Patient tolerated treatment well Patient left: in chair;with call bell/phone within reach;with family/visitor present Nurse Communication: Mobility status;Need for lift equipment PT Visit Diagnosis: Other abnormalities of gait and mobility (R26.89);Difficulty in walking, not elsewhere classified (R26.2);Hemiplegia and hemiparesis Hemiplegia - Right/Left: Left Hemiplegia - dominant/non-dominant: Non-dominant Hemiplegia - caused by: Cerebral infarction     Time: 1322-1402 PT Time Calculation (min) (ACUTE ONLY): 40 min  Charges:  $Therapeutic Activity: 38-52 mins                     Wyona Almas, PT, DPT Acute Rehabilitation Services Pager [phone] Office [phone]    Deno Etienne 06/09/2021, 3:04 PM

## 2021-06-09 NOTE — TOC Initial Note (Signed)
Transition of Care [facility]) - Initial/Assessment Note    Patient Details  Name: [name] MRN: [id number] Date of Birth: 10/17/1932  Transition of Care [facility]) CM/SW Contact:    Reece Agar, Latanya Presser Phone Number: 06/09/2021, 1:00 PM  Clinical Narrative:                 9622: CSW spoke with pt and pt son [name] [phone]) who both stated pt does not want to got to any Snf for rehab because of previous permeant injury at a SNF a year ago that left her wheelchair bound. Pt's daughter [name] [phone]) has been overseeing and setting up her mothers care and has her set up at home for [employer]. Gabriela and pt asked about going home with compression machine, purewick and a wheelchair with a hoyer lift. Csw will follow up with NCM on DME needs.        Patient Goals and CMS Choice        Expected Discharge Plan and Services                                                Prior Living Arrangements/Services                       Activities of Daily Living Home Assistive Devices/Equipment: Wheelchair, Shower chair with back, Transfer board ADL Screening (condition at time of admission) Patient's cognitive ability adequate to safely complete daily activities?: Yes Is the patient deaf or have difficulty hearing?: No Does the patient have difficulty seeing, even when wearing glasses/contacts?: No Does the patient have difficulty concentrating, remembering, or making decisions?: No Patient able to express need for assistance with ADLs?: No Does the patient have difficulty dressing or bathing?: No Independently performs ADLs?: No Communication: Independent Does the patient have difficulty walking or climbing stairs?: Yes Weakness of Legs: Both Weakness of Arms/Hands: Both  Permission Sought/Granted                  Emotional Assessment              Admission diagnosis:  Acute CVA (cerebrovascular accident) ([place]) [I63.9] Cerebrovascular  accident (CVA), unspecified mechanism ([place]) [I63.9] Stroke [facility]) [I63.9] Patient Active Problem List   Diagnosis Date Noted   Stroke ([place]) 06/08/2021   Acute CVA (cerebrovascular accident) ([place]) 06/07/2021   Atherosclerotic heart disease of native coronary artery without angina pectoris 04/13/2021   Chronic edema 04/13/2021   Difficulty in walking, not elsewhere classified 04/13/2021   Frail elderly 04/13/2021   Giant cell arteritis ([place]) 04/13/2021   Hypercholesterolemia 04/13/2021   Hypertensive heart disease without congestive heart failure 04/13/2021   Hypomagnesemia 04/13/2021   Osteoarthritis 04/13/2021   Protein calorie malnutrition ([place]) 04/13/2021   Age-related osteoporosis without current pathological fracture 12/13/2020   Congestive heart failure ([place]) 12/13/2020   Monoclonal paraproteinemia 12/13/2020   Multiple sclerosis ([place]) 12/13/2020   Unspecified abnormal finding in specimens from other organs, systems and tissues 12/13/2020   Vitamin D deficiency 12/13/2020   Hyponatremia 04/09/2020   Acetabular fracture ([place]) 04/01/2020   Closed right ischial fracture ([place]) 04/01/2020   Hypertension 04/01/2020   Hypothyroidism 04/01/2020   Frequent falls 03/23/2020   Tympanic membrane central perforation 12/15/2019   Cholesteatoma of external auditory canal, right 09/10/2019   Hearing loss 09/10/2019   Chronic  venous insufficiency of lower extremity 01/25/2019   Grade II diastolic dysfunction 26/83/4196   Urinary dysfunction 12/22/2017   Multiple sclerosis, primary progressive ([place]) 06/19/2017   Left foot drop 03/17/2017   Primary chronic progressive multiple sclerosis ([place]) 03/17/2017   Myelopathy ([place]) 03/17/2017   Bilateral lower extremity edema 03/17/2017   PCP:  [name], MD Pharmacy:   [place], [place] [address] Phone: [phone] Fax:  [phone]     Social Determinants of Health (SDOH) Interventions    Readmission Risk Interventions Readmission Risk Prevention Plan 03/23/2020  Post Dischage Appt Complete  Medication Screening Complete  Transportation Screening Complete  Some recent data might be hidden

## 2021-06-09 NOTE — NC FL2 (Signed)
[place] LEVEL OF CARE SCREENING TOOL     IDENTIFICATION  Patient Name: [name] Birthdate: 03/06/1932 Sex: female Admission Date (Current Location): 06/07/2021  [facility] and [place] Number:  Herbalist and Address:  [facility], [place] [address] [place], [place] [address]      Provider Number: [id number]  Attending Physician Name and Address:  Elgergawy, Silver Huguenin, MD  Relative Name and Phone Number:  Edward Jolly (Daughter)   [phone]    Current Level of Care: Hospital Recommended Level of Care: [place] Prior Approval Number:    Date Approved/Denied:   PASRR Number: [id number]  Discharge Plan: SNF    Current Diagnoses: Patient Active Problem List   Diagnosis Date Noted   Stroke [facility]) 06/08/2021   Acute CVA (cerebrovascular accident) ([place]) 06/07/2021   Atherosclerotic heart disease of native coronary artery without angina pectoris 04/13/2021   Chronic edema 04/13/2021   Difficulty in walking, not elsewhere classified 04/13/2021   Frail elderly 04/13/2021   Giant cell arteritis ([place]) 04/13/2021   Hypercholesterolemia 04/13/2021   Hypertensive heart disease without congestive heart failure 04/13/2021   Hypomagnesemia 04/13/2021   Osteoarthritis 04/13/2021   Protein calorie malnutrition ([place]) 04/13/2021   Age-related osteoporosis without current pathological fracture 12/13/2020   Congestive heart failure ([place]) 12/13/2020   Monoclonal paraproteinemia 12/13/2020   Multiple sclerosis ([place]) 12/13/2020   Unspecified abnormal finding in specimens from other organs, systems and tissues 12/13/2020   Vitamin D deficiency 12/13/2020   Hyponatremia 04/09/2020   Acetabular fracture ([place]) 04/01/2020   Closed right ischial fracture ([place]) 04/01/2020   Hypertension 04/01/2020   Hypothyroidism 04/01/2020   Frequent falls 03/23/2020   Tympanic membrane central perforation 12/15/2019   Cholesteatoma  of external auditory canal, right 09/10/2019   Hearing loss 09/10/2019   Chronic venous insufficiency of lower extremity 01/25/2019   Grade II diastolic dysfunction 56/38/7564   Urinary dysfunction 12/22/2017   Multiple sclerosis, primary progressive ([place]) 06/19/2017   Left foot drop 03/17/2017   Primary chronic progressive multiple sclerosis ([place]) 03/17/2017   Myelopathy ([place]) 03/17/2017   Bilateral lower extremity edema 03/17/2017    Orientation RESPIRATION BLADDER Height & Weight     Self, Time, Situation, Place  Normal Continent, External catheter Weight: 120 lb (54.4 kg) Height:  5\' 5"  (165.1 cm)  BEHAVIORAL SYMPTOMS/MOOD NEUROLOGICAL BOWEL NUTRITION STATUS      Continent Diet (See DC Summary)  AMBULATORY STATUS COMMUNICATION OF NEEDS Skin   Extensive Assist Verbally Normal                       Personal Care Assistance Level of Assistance  Bathing, Feeding, Dressing Bathing Assistance: Maximum assistance Feeding assistance: Limited assistance Dressing Assistance: Maximum assistance     Functional Limitations Info  Sight, Hearing, Speech Sight Info: Adequate Hearing Info: Adequate Speech Info: Adequate    SPECIAL CARE FACTORS FREQUENCY  PT (By licensed PT), OT (By licensed OT)     PT Frequency: 5x a week OT Frequency: 5x a week            Contractures Contractures Info: Not present    Additional Factors Info  Code Status, Allergies Code Status Info: DNR Allergies Info: Sulfasalazine   Aspirin   Penicillins   Sulfa Antibiotics   Sulfamethoxazole-trimethoprim           Current Medications (06/09/2021):  This is the current hospital active medication list Current Facility-Administered Medications  Medication Dose  Route Frequency Provider Last Rate Last Admin    stroke: mapping our early stages of recovery book   Does not apply Once Marcelyn Bruins, MD       acetaminophen (TYLENOL) tablet 650 mg  650 mg Oral Q4H PRN Marcelyn Bruins, MD   650  mg at 06/07/21 2304   Or   acetaminophen (TYLENOL) 160 MG/5ML solution 650 mg  650 mg Per Tube Q4H PRN Marcelyn Bruins, MD       Or   acetaminophen (TYLENOL) suppository 650 mg  650 mg Rectal Q4H PRN Marcelyn Bruins, MD       aspirin EC tablet 81 mg  81 mg Oral Daily Marcelyn Bruins, MD   81 mg at 06/09/21 0853   atorvastatin (LIPITOR) tablet 80 mg  80 mg Oral Daily Marcelyn Bruins, MD   80 mg at 06/09/21 0900   baclofen (LIORESAL) tablet 10 mg  10 mg Oral TID Marcelyn Bruins, MD   10 mg at 06/09/21 2707   clopidogrel (PLAVIX) tablet 75 mg  75 mg Oral Daily Bhagat, Srishti L, MD   75 mg at 06/09/21 8675   diclofenac Sodium (VOLTAREN) 1 % topical gel 2 g  2 g Topical QID Rosalin Hawking, MD   2 g at 06/08/21 2101   enoxaparin (LOVENOX) injection 40 mg  40 mg Subcutaneous Q24H Marcelyn Bruins, MD   40 mg at 06/08/21 2057   feeding supplement (ENSURE ENLIVE / ENSURE PLUS) liquid 237 mL  237 mL Oral BID BM Elgergawy, Silver Huguenin, MD   237 mL at 06/09/21 0900   lactated ringers infusion   Intravenous Continuous Elgergawy, Silver Huguenin, MD 75 mL/hr at 06/08/21 1220 New Bag at 06/08/21 1220   levothyroxine (SYNTHROID) tablet 25 mcg  25 mcg Oral Q0600 Marcelyn Bruins, MD   25 mcg at 06/09/21 0542   methocarbamol (ROBAXIN) tablet 500 mg  500 mg Oral TID Marcelyn Bruins, MD   500 mg at 06/09/21 4492   nabumetone (RELAFEN) tablet 500 mg  500 mg Oral BID PRN Marcelyn Bruins, MD       pantoprazole (PROTONIX) EC tablet 40 mg  40 mg Oral Daily Elgergawy, Silver Huguenin, MD   40 mg at 06/09/21 0100   polyvinyl alcohol (LIQUIFILM TEARS) 1.4 % ophthalmic solution 1 drop  1 drop Both Eyes TID PRN Heloise Purpura, RPH       senna-docusate (Senokot-S) tablet 1 tablet  1 tablet Oral QHS PRN Marcelyn Bruins, MD         Discharge Medications: Please see discharge summary for a list of discharge medications.  Relevant Imaging Results:  Relevant Lab Results:   Additional  Information SS# [id number]; [place] COVID-19 Vaccine 02/29/2020 , 02/10/2020 , 01/16/2020  Reece Agar, LCSWA

## 2021-06-09 NOTE — Progress Notes (Signed)
PROGRESS NOTE    [name]  FUX:[id number] DOB: 1932-02-05 DOA: 06/07/2021 PCP: [name], MD    Chief Complaint  Patient presents with   Facial Droop    Brief Narrative:   [name] is a 87 y.o. female with medical history significant of multiple sclerosis, hypothyroidism, hypertension, hyperlipidemia, rheumatoid arthritis, giant cell arteritis, falls, CHF, venous insufficiency, chronic edema, osteoporosis, acetabular fracture presents with new onset weakness. Patient presents with left-sided weakness, facial droop and slurred speech, MRI brain significant for acute/subacute infarct and right putamen extending into the corona radiata, she was admitted for CVA work-up  Assessment & Plan:   Principal Problem:   Acute CVA (cerebrovascular accident) [facility]) Active Problems:   Acetabular fracture ([place])   Hypertension   Hypothyroidism   Congestive heart failure ([place])   Multiple sclerosis ([place])   Chronic edema   Stroke ([place])  Acute CVA -Patient presents with left side weakness, dysphagia, MRI brain significant for right corona radiata stroke, CTA head and neck did not reveal any significant stenosis, 2D echo with preserved EF 60 to 65%. -LDL elevated at 156, continue with statin -Recommendation for dual antiplatelet therapy for 21 days, followed by aspirin monotherapy. -PT/OT consulted -Please see discussion below regarding dysphagia  Dysphagia -Patient failed bedside evaluation by SLP yesterday, went for MBS, currently on dysphagia with nectar thick .  To be followed closely by SLP over the weekend, if no improvement may need cortrak tube -Very limited liquid intake, continue with IV fluids, .  MS Acetabular fracture - Some right-sided deficits due to prior unrepaired hip fracture.  Prior imaging shows healed acetabular fracture with superior migration of the false acetabulum.  Progressive avascular necrosis and collapse of the femoral head. -  Appreciate any recommendations neurology may have - Not currently on any medications for her MS per patient report, other than muscle relaxants - Continue baclofen and Robaxin   Diastolic CHF > Last echo in 2020 with EF 60-65%, G2 DD moderate aortic regurgitation moderate mitral regurgitation - Not currently on any diuretics   Hypertension - Holding home antihypertensives in setting of acute CVA as above, though she reports she is not currently taking losartan.  Hypothyroidism - Continue home Synthroid   Rheumatoid arthritis/Osteoarthritis -Patient/family had second opinion at [facility], and they reported she does not have an diagnosis rheumatoid arthritis, she is off immunotherapy currently .  MS  -  followed by Dr. Felecia Shelling    Hard of hearing - Noted    DVT prophylaxis: Lovenox Code Status: Full Family Communication: son and daughter  at bedside Disposition:   Status is: Inpatient  The patient will require care spanning > 2 midnights and should be moved to inpatient because: IV treatments appropriate due to intensity of illness or inability to take PO  Dispo: The patient is from: Home              Anticipated d/c is to: SNF vs SNF              Patient currently is not medically stable to d/c.     Difficult to place patient No       Consultants:  Neurology  Subjective:  Patient failed her initial swallowing evaluation, plan for MBS today, likely will require core track tube  Objective: Vitals:   06/08/21 0715 06/08/21 1256 06/08/21 2106 06/09/21 0520  BP:  (!) 147/69 111/61 (!) 147/58  Pulse:  65 62 64  Resp:  18    Temp:  97.8 F (36.6 C) 98 F (36.7 C) 98.7 F (37.1 C)  TempSrc:  Oral Oral Oral  SpO2: 96% 98% 95% 100%  Weight:      Height:        Intake/Output Summary (Last 24 hours) at 06/09/2021 1155 Last data filed at 06/09/2021 0547 Gross per 24 hour  Intake 1119.06 ml  Output 625 ml  Net 494.06 ml   Filed Weights   06/07/21 1130  Weight: 54.4 kg     Examination:   Awake Alert, Oriented X 3, frail, deconditioned, appropriate ,Normal affect Symmetrical Chest wall movement, Good air movement bilaterally, CTAB RRR,No Gallops,Rubs or new Murmurs, No Parasternal Heave +ve B.Sounds, Abd Soft, No tenderness, No rebound - guarding or rigidity. No Cyanosis, Clubbing or edema, No new Rash or bruise       Data Reviewed: I have personally reviewed following labs and imaging studies  CBC: Recent Labs  Lab 06/07/21 1141 06/07/21 1211 06/09/21 0302  WBC 6.3  --  7.0  NEUTROABS 2.9  --   --   HGB 14.3 14.3 12.5  HCT 41.9 42.0 36.2  MCV 93.1  --  91.9  PLT 201  --  595    Basic Metabolic Panel: Recent Labs  Lab 06/07/21 1141 06/07/21 1211 06/09/21 0302  NA 133* 134* 134*  K 4.3 4.2 3.6  CL 99 99 101  CO2 22  --  23  GLUCOSE 99 104* 86  BUN 15 19 14   CREATININE 0.68 0.60 0.69  CALCIUM 9.2  --  8.5*    GFR: Estimated Creatinine Clearance: 43.4 mL/min (by C-G formula based on SCr of 0.69 mg/dL).  Liver Function Tests: Recent Labs  Lab 06/07/21 1141  AST 35  ALT 19  ALKPHOS 67  BILITOT 0.7  PROT 6.2*  ALBUMIN 3.9    CBG: Recent Labs  Lab 06/07/21 1129  GLUCAP 99     Recent Results (from the past 240 hour(s))  Resp Panel by RT-PCR (Flu A&B, Covid) Nasopharyngeal Swab     Status: None   Collection Time: 06/07/21  7:30 PM   Specimen: Nasopharyngeal Swab; Nasopharyngeal(NP) swabs in vial transport medium  Result Value Ref Range Status   SARS Coronavirus 2 by RT PCR NEGATIVE NEGATIVE Final    Comment: (NOTE) SARS-CoV-2 target nucleic acids are NOT DETECTED.  The SARS-CoV-2 RNA is generally detectable in upper respiratory specimens during the acute phase of infection. The lowest concentration of SARS-CoV-2 viral copies this assay can detect is 138 copies/mL. A negative result does not preclude SARS-Cov-2 infection and should not be used as the sole basis for treatment or other patient management  decisions. A negative result may occur with  improper specimen collection/handling, submission of specimen other than nasopharyngeal swab, presence of viral mutation(s) within the areas targeted by this assay, and inadequate number of viral copies(<138 copies/mL). A negative result must be combined with clinical observations, patient history, and epidemiological information. The expected result is Negative.  Fact Sheet for Patients:  EntrepreneurPulse.com.au  Fact Sheet for Healthcare Providers:  IncredibleEmployment.be  This test is no t yet approved or cleared by the [place] FDA and  has been authorized for detection and/or diagnosis of SARS-CoV-2 by FDA under an Emergency Use Authorization (EUA). This EUA will remain  in effect (meaning this test can be used) for the duration of the COVID-19 declaration under Section 564(b)(1) of the Act, 21 U.S.C.section 360bbb-3(b)(1), unless the authorization is terminated  or revoked sooner.  Influenza A by PCR NEGATIVE NEGATIVE Final   Influenza B by PCR NEGATIVE NEGATIVE Final    Comment: (NOTE) The Xpert Xpress SARS-CoV-2/FLU/RSV plus assay is intended as an aid in the diagnosis of influenza from Nasopharyngeal swab specimens and should not be used as a sole basis for treatment. Nasal washings and aspirates are unacceptable for Xpert Xpress SARS-CoV-2/FLU/RSV testing.  Fact Sheet for Patients: EntrepreneurPulse.com.au  Fact Sheet for Healthcare Providers: IncredibleEmployment.be  This test is not yet approved or cleared by the [place] FDA and has been authorized for detection and/or diagnosis of SARS-CoV-2 by FDA under an Emergency Use Authorization (EUA). This EUA will remain in effect (meaning this test can be used) for the duration of the COVID-19 declaration under Section 564(b)(1) of the Act, 21 U.S.C. section 360bbb-3(b)(1), unless the  authorization is terminated or revoked.  Performed at [facility], [place] [address], [place], [place] [address]          Radiology Studies: CT Angio Head W or Wo Contrast  Result Date: 06/07/2021 CLINICAL DATA:  Neuro deficit, acute, stroke suspected EXAM: CT ANGIOGRAPHY HEAD AND NECK TECHNIQUE: Multidetector CT imaging of the head and neck was performed using the standard protocol during bolus administration of intravenous contrast. Multiplanar CT image reconstructions and MIPs were obtained to evaluate the vascular anatomy. Carotid stenosis measurements (when applicable) are obtained utilizing NASCET criteria, using the distal internal carotid diameter as the denominator. CONTRAST:  70mL OMNIPAQUE IOHEXOL 350 MG/ML SOLN COMPARISON:  None. FINDINGS: CTA NECK FINDINGS SKELETON: There is no bony spinal canal stenosis. No lytic or blastic lesion. OTHER NECK: Normal pharynx, larynx and major salivary glands. No cervical lymphadenopathy. Unremarkable thyroid gland. UPPER CHEST: No pneumothorax or pleural effusion. No nodules or masses. AORTIC ARCH: There is no calcific atherosclerosis of the aortic arch. There is no aneurysm, dissection or hemodynamically significant stenosis of the visualized portion of the aorta. Conventional 3 vessel aortic branching pattern. The visualized proximal subclavian arteries are widely patent. RIGHT CAROTID SYSTEM: Normal without aneurysm, dissection or stenosis. LEFT CAROTID SYSTEM: Normal without aneurysm, dissection or stenosis. VERTEBRAL ARTERIES: Right dominant configuration. Both origins are clearly patent. There is no dissection, occlusion or flow-limiting stenosis to the skull base (V1-V3 segments). CTA HEAD FINDINGS POSTERIOR CIRCULATION: --Vertebral arteries: Left vertebral artery terminates in PICA. Normal right vertebral artery V4 segment --Inferior cerebellar arteries: Normal. --Basilar artery: Normal. --Superior cerebellar arteries: Normal. --Posterior  cerebral arteries (PCA): Normal. ANTERIOR CIRCULATION: --Intracranial internal carotid arteries: Normal. --Anterior cerebral arteries (ACA): Normal. Absent left A1 segment, normal variant --Middle cerebral arteries (MCA): Normal. VENOUS SINUSES: As permitted by contrast timing, patent. ANATOMIC VARIANTS: Persistent trigeminal artery. Absent left A1 segment. Review of the MIP images confirms the above findings. IMPRESSION: 1. No emergent large vessel occlusion or high-grade stenosis of the intracranial arteries. 2. Persistent trigeminal artery, normal variant. Electronically Signed   By: Ulyses Jarred M.D.   On: 06/07/2021 19:55   CT HEAD WO CONTRAST  Result Date: 06/07/2021 CLINICAL DATA:  Neuro deficit, acute stroke suspected. EXAM: CT HEAD WITHOUT CONTRAST TECHNIQUE: Contiguous axial images were obtained from the base of the skull through the vertex without intravenous contrast. COMPARISON:  MRI Apr 20, 2021.  CT head April 03, 2010. FINDINGS: Brain: No evidence of acute large vascular territory infarction, hemorrhage, hydrocephalus, extra-axial collection or mass lesion/mass effect. Moderate patchy white matter hypoattenuation. Generalized atrophy with ex vacuo ventricular dilation. Vascular: No hyperdense vessel identified. Calcific atherosclerosis. Skull: No acute fracture. Sinuses/Orbits: Visualized  sinuses are largely clear. No acute orbital findings. Other: No mastoid effusions. IMPRESSION: 1. No evidence of acute large vascular territory infarct or acute hemorrhage. 2. Moderate patchy white matter hypoattenuation, which could represent chronic microvascular ischemic disease and/or demyelination in this patient with a history of multiple sclerosis. An MRI with contrast could provide more sensitive evaluation for acute infarct or active demyelination if clinically indicated. Electronically Signed   By: Margaretha Sheffield MD   On: 06/07/2021 14:39   CT Angio Neck W and/or Wo Contrast  Result Date:  06/07/2021 CLINICAL DATA:  Neuro deficit, acute, stroke suspected EXAM: CT ANGIOGRAPHY HEAD AND NECK TECHNIQUE: Multidetector CT imaging of the head and neck was performed using the standard protocol during bolus administration of intravenous contrast. Multiplanar CT image reconstructions and MIPs were obtained to evaluate the vascular anatomy. Carotid stenosis measurements (when applicable) are obtained utilizing NASCET criteria, using the distal internal carotid diameter as the denominator. CONTRAST:  60mL OMNIPAQUE IOHEXOL 350 MG/ML SOLN COMPARISON:  None. FINDINGS: CTA NECK FINDINGS SKELETON: There is no bony spinal canal stenosis. No lytic or blastic lesion. OTHER NECK: Normal pharynx, larynx and major salivary glands. No cervical lymphadenopathy. Unremarkable thyroid gland. UPPER CHEST: No pneumothorax or pleural effusion. No nodules or masses. AORTIC ARCH: There is no calcific atherosclerosis of the aortic arch. There is no aneurysm, dissection or hemodynamically significant stenosis of the visualized portion of the aorta. Conventional 3 vessel aortic branching pattern. The visualized proximal subclavian arteries are widely patent. RIGHT CAROTID SYSTEM: Normal without aneurysm, dissection or stenosis. LEFT CAROTID SYSTEM: Normal without aneurysm, dissection or stenosis. VERTEBRAL ARTERIES: Right dominant configuration. Both origins are clearly patent. There is no dissection, occlusion or flow-limiting stenosis to the skull base (V1-V3 segments). CTA HEAD FINDINGS POSTERIOR CIRCULATION: --Vertebral arteries: Left vertebral artery terminates in PICA. Normal right vertebral artery V4 segment --Inferior cerebellar arteries: Normal. --Basilar artery: Normal. --Superior cerebellar arteries: Normal. --Posterior cerebral arteries (PCA): Normal. ANTERIOR CIRCULATION: --Intracranial internal carotid arteries: Normal. --Anterior cerebral arteries (ACA): Normal. Absent left A1 segment, normal variant --Middle cerebral  arteries (MCA): Normal. VENOUS SINUSES: As permitted by contrast timing, patent. ANATOMIC VARIANTS: Persistent trigeminal artery. Absent left A1 segment. Review of the MIP images confirms the above findings. IMPRESSION: 1. No emergent large vessel occlusion or high-grade stenosis of the intracranial arteries. 2. Persistent trigeminal artery, normal variant. Electronically Signed   By: Ulyses Jarred M.D.   On: 06/07/2021 19:55   MR Brain W and Wo Contrast  Result Date: 06/07/2021 CLINICAL DATA:  Neuro deficit, acute, stroke suspected. EXAM: MRI HEAD WITHOUT AND WITH CONTRAST TECHNIQUE: Multiplanar, multiecho pulse sequences of the brain and surrounding structures were obtained without and with intravenous contrast. CONTRAST:  14mL GADAVIST GADOBUTROL 1 MMOL/ML IV SOLN COMPARISON:  Head CT June 07, 2021.  MRI of the brain Apr 20, 2021. FINDINGS: Brain: Small area of restricted diffusion extending from the posterior aspect of the right putamen into the corona radiata, consistent with acute/subacute infarct. No hemorrhage, hydrocephalus, extra-axial collection or mass lesion. Remote lacunar infarct in the right cerebellar hemisphere scattered and confluent foci of T2 hyperintensity are seen within the white matter of the cerebral hemispheres and within the pons, similar to prior MRI. No focus of abnormal contrast enhancement. Moderate parenchymal volume loss. Vascular: Normal flow voids. Skull and upper cervical spine: Normal marrow signal. Sinuses/Orbits: Negative. IMPRESSION: 1. Focus of restricted diffusion involving the right putamen extending into the corona radiata consistent with acute/subacute infarct. 2. Moderate chronic white matter  lesions, likely a combination of demyelinating disease and chronic microvascular ischemia, unchanged from prior MRI. No focus of abnormal contrast enhancement to suggest active demyelination. 3. Remote lacunar infarct in the right cerebellar hemisphere. 4. Moderate parenchymal  volume loss. Electronically Signed   By: Pedro Earls M.D.   On: 06/07/2021 16:37   DG Swallowing Func-Speech Pathology  Result Date: 06/08/2021 Formatting of this result is different from the original. Objective Swallowing Evaluation: Type of Study: MBS-Modified Barium Swallow Study  Patient Details Name: Jaiyanna Safran MRN: [id number] Date of Birth: 12/16/1931 Today's Date: 06/08/2021 Time: SLP Start Time (ACUTE ONLY): 29 -SLP Stop Time (ACUTE ONLY): 8119 SLP Time Calculation (min) (ACUTE ONLY): 23 min Past Medical History: Past Medical History: Diagnosis Date  Chronic venous insufficiency of lower extremity 01/25/2019  With chronic bilateral lower extremity edema  Incontinent of urine   Multiple sclerosis ([place]) 2015  Osteoporosis   osteopenia  Polyarthritis   Post-menopausal   Rheumatoid arthritis ([place])   Temporal arteritis ([place]) 2011 Past Surgical History: Past Surgical History: Procedure Laterality Date  ABDOMINAL HYSTERECTOMY  1971  TVH  breast ductectomy Right 05/1995  COLONOSCOPY  10/2011 HPI: Pt is an 87 y.o. female who presented with left-sided weakness, facial droop, and slurred speech. MRI brain: Focus of restricted diffusion involving the right putamen extending into the corona radiata consistent with acute/subacute infarct. Pt failed the [place] since she stopped drinking. PMH: multiple sclerosis, hypothyroidism, hypertension, hyperlipidemia, rheumatoid arthritis, giant cell arteritis, falls, CHF, venous insufficiency, chronic edema, osteoporosis, acetabular fracture.  No data recorded Assessment / Plan / Recommendation CHL IP CLINICAL IMPRESSIONS 06/08/2021 Clinical Impression Pt presents with oropharyngeal dysphagia characterized by weak bolus manipulation, reduced labial seal, reduced posterior bolus propulsion, reduced lingual retraction, and reduced anterior laryngeal movement. She demonstrated difficulty with anterior spillage to the left, bolus formation, prolonged  mastication, vallecular residue, pyriform sinus residue, penetration (PAS 3,5) and sensed aspiration (PAS 7) with thin liquids and intermittently with nectar thick liquids. Penetration and aspiration often occured after the swallow secondary to spillover of pyriform sinus residue to the larynx or due to subsequent aspiration of penetrated material. All instances of aspiration resulted in coughing which did mobilize the penetrate, but pt's cough was inadequately strong to expel penetrated/aspirated material. Compensatory strategies were ineffective in eliminating laryngeal invasion with thin liquids. No functional benefit was noted with a chin tuck posture or with effortful swallows with nectar thick liquids, but a left head turn posture consistently eliminated laryngeal invasion with nectar thick liquids. Moderate vallecular residue was noted with full-tsp boluses and was eliminated with secondary swallows and/or a liquid wash. Pyriform sinus residue was eliminated with secondary swallows. Transit of the 17mm barium tablet (given with nectar thick liquids) was halted at the level of the pyriform sinuses, but transport was facilitated with use of a puree bolus and secondary swallows. A dysphagia 1 diet with nectar thick liquids is recommended at this time with strict observance of swallowing precautions. SLP will follow for dysphagia treatment. SLP Visit Diagnosis Dysphagia, oropharyngeal phase (R13.12) Attention and concentration deficit following -- Frontal lobe and executive function deficit following -- Impact on safety and function Moderate aspiration risk   CHL IP TREATMENT RECOMMENDATION 06/08/2021 Treatment Recommendations Therapy as outlined in treatment plan below   Prognosis 06/08/2021 Prognosis for Safe Diet Advancement Good Barriers to Reach Goals Severity of deficits Barriers/Prognosis Comment -- CHL IP DIET RECOMMENDATION 06/08/2021 SLP Diet Recommendations Dysphagia 1 (Puree) solids;Nectar thick liquid  Liquid Administration via  Cup;Straw Medication Administration Crushed with puree Compensations Slow rate;Small sips/bites;Follow solids with liquid Postural Changes Seated upright at 90 degrees   CHL IP OTHER RECOMMENDATIONS 06/08/2021 Recommended Consults -- Oral Care Recommendations Oral care BID;Patient independent with oral care Other Recommendations --   CHL IP FOLLOW UP RECOMMENDATIONS 06/08/2021 Follow up Recommendations Skilled Nursing facility   [facility] IP FREQUENCY AND DURATION 06/08/2021 Speech Therapy Frequency (ACUTE ONLY) min 2x/week Treatment Duration 2 weeks      CHL IP ORAL PHASE 06/08/2021 Oral Phase Impaired Oral - Pudding Teaspoon -- Oral - Pudding Cup -- Oral - Honey Teaspoon -- Oral - Honey Cup -- Oral - Nectar Teaspoon -- Oral - Nectar Cup Weak lingual manipulation;Reduced posterior propulsion;Left anterior bolus loss Oral - Nectar Straw Weak lingual manipulation;Reduced posterior propulsion;Left anterior bolus loss Oral - Thin Teaspoon -- Oral - Thin Cup Weak lingual manipulation;Reduced posterior propulsion;Left anterior bolus loss Oral - Thin Straw Weak lingual manipulation;Reduced posterior propulsion;Left anterior bolus loss Oral - Puree Weak lingual manipulation;Reduced posterior propulsion;Left anterior bolus loss;Lingual/palatal residue Oral - Mech Soft -- Oral - Regular Weak lingual manipulation;Reduced posterior propulsion;Impaired mastication;Delayed oral transit;Lingual/palatal residue Oral - Multi-Consistency -- Oral - Pill Weak lingual manipulation;Reduced posterior propulsion Oral Phase - Comment --  CHL IP PHARYNGEAL PHASE 06/08/2021 Pharyngeal Phase Impaired Pharyngeal- Pudding Teaspoon -- Pharyngeal -- Pharyngeal- Pudding Cup -- Pharyngeal -- Pharyngeal- Honey Teaspoon -- Pharyngeal -- Pharyngeal- Honey Cup -- Pharyngeal -- Pharyngeal- Nectar Teaspoon -- Pharyngeal -- Pharyngeal- Nectar Cup Reduced anterior laryngeal mobility;Reduced tongue base retraction;Pharyngeal residue -  valleculae;Pharyngeal residue - pyriform;Penetration/Apiration after swallow Pharyngeal Material enters airway, remains ABOVE vocal cords and not ejected out;Material enters airway, CONTACTS cords and not ejected out Pharyngeal- Nectar Straw Reduced anterior laryngeal mobility;Reduced tongue base retraction;Pharyngeal residue - valleculae;Pharyngeal residue - pyriform;Penetration/Apiration after swallow Pharyngeal Material enters airway, remains ABOVE vocal cords and not ejected out;Material enters airway, CONTACTS cords and not ejected out;Material enters airway, passes BELOW cords and not ejected out despite cough attempt by patient Pharyngeal- Thin Teaspoon -- Pharyngeal -- Pharyngeal- Thin Cup Reduced anterior laryngeal mobility;Reduced tongue base retraction;Pharyngeal residue - valleculae;Pharyngeal residue - pyriform;Penetration/Apiration after swallow Pharyngeal Material enters airway, remains ABOVE vocal cords and not ejected out;Material enters airway, CONTACTS cords and not ejected out;Material enters airway, passes BELOW cords and not ejected out despite cough attempt by patient Pharyngeal- Thin Straw Reduced anterior laryngeal mobility;Reduced tongue base retraction;Pharyngeal residue - valleculae;Pharyngeal residue - pyriform;Penetration/Apiration after swallow Pharyngeal Material enters airway, CONTACTS cords and not ejected out;Material enters airway, passes BELOW cords and not ejected out despite cough attempt by patient Pharyngeal- Puree Reduced anterior laryngeal mobility;Reduced tongue base retraction;Pharyngeal residue - valleculae;Pharyngeal residue - pyriform Pharyngeal -- Pharyngeal- Mechanical Soft -- Pharyngeal -- Pharyngeal- Regular Reduced anterior laryngeal mobility;Reduced tongue base retraction;Pharyngeal residue - valleculae;Pharyngeal residue - pyriform Pharyngeal -- Pharyngeal- Multi-consistency -- Pharyngeal -- Pharyngeal- Pill Reduced anterior laryngeal mobility;Reduced tongue  base retraction;Pharyngeal residue - valleculae;Pharyngeal residue - pyriform Pharyngeal -- Pharyngeal Comment --  CHL IP CERVICAL ESOPHAGEAL PHASE 06/08/2021 Cervical Esophageal Phase WFL Pudding Teaspoon -- Pudding Cup -- Honey Teaspoon -- Honey Cup -- Nectar Teaspoon -- Nectar Cup -- Nectar Straw -- Thin Teaspoon -- Thin Cup -- Thin Straw -- Puree -- Mechanical Soft -- Regular -- Multi-consistency -- Pill -- Cervical Esophageal Comment -- Shanika I. Hardin Negus, [place], [place] Office number [phone] Pager [place] 06/08/2021, 2:44 PM              ECHOCARDIOGRAM COMPLETE  Result Date: 06/08/2021    ECHOCARDIOGRAM REPORT   Patient Name:   BRYLEA PITA [facility] Date of Exam: 06/08/2021 Medical Rec #:  [id number]              Height:       65.0 in Accession #:    [id number]             Weight:       120.0 lb Date of Birth:  02-Aug-1932              BSA:          1.592 m Patient Age:    85 years               BP:           147/69 mmHg Patient Gender: F                      HR:           58 bpm. Exam Location:  Inpatient Procedure: 2D Echo Indications:    stroke  History:        Patient has prior history of Echocardiogram examinations, most                 recent 12/16/2018. CHF, multiple sclerosis; Risk                 Factors:Hypertension.  Sonographer:    Johny Chess Referring Phys: [id number] Brooks  1. Left ventricular ejection fraction, by estimation, is 60 to 65%. The left ventricle has normal function. The left ventricle has no regional wall motion abnormalities. Left ventricular diastolic parameters are indeterminate. Elevated left ventricular end-diastolic pressure.  2. Right ventricular systolic function is normal. The right ventricular size is normal. There is normal pulmonary artery systolic pressure.  3. The mitral valve is normal in structure. Mild mitral valve regurgitation.  4. The aortic valve is normal in structure. Aortic valve  regurgitation is mild. No aortic stenosis is present. FINDINGS  Left Ventricle: Left ventricular ejection fraction, by estimation, is 60 to 65%. The left ventricle has normal function. The left ventricle has no regional wall motion abnormalities. The left ventricular internal cavity size was normal in size. There is  no left ventricular hypertrophy. Left ventricular diastolic parameters are indeterminate. Elevated left ventricular end-diastolic pressure. Right Ventricle: The right ventricular size is normal. Right vetricular wall thickness was not well visualized. Right ventricular systolic function is normal. There is normal pulmonary artery systolic pressure. The tricuspid regurgitant velocity is 2.39 m/s, and with an assumed right atrial pressure of 3 mmHg, the estimated right ventricular systolic pressure is 53.6 mmHg. Left Atrium: Left atrial size was normal in size. Right Atrium: Right atrial size was normal in size. Pericardium: There is no evidence of pericardial effusion. Mitral Valve: The mitral valve is normal in structure. Mild mitral valve regurgitation. Tricuspid Valve: The tricuspid valve is normal in structure. Tricuspid valve regurgitation is mild. Aortic Valve: The aortic valve is normal in structure. Aortic valve regurgitation is mild. Aortic regurgitation PHT measures 586 msec. No aortic stenosis is present. Pulmonic Valve: The pulmonic valve was grossly normal. Pulmonic valve regurgitation is not visualized. Aorta: The aortic root and ascending aorta are structurally normal, with no evidence of dilitation. IAS/Shunts: The atrial septum is grossly normal.  LEFT VENTRICLE PLAX 2D LVIDd:         3.70 cm  Diastology LVIDs:  2.50 cm  LV e' medial:    4.57 cm/s LV PW:         1.00 cm  LV E/e' medial:  20.4 LV IVS:        1.00 cm  LV e' lateral:   7.83 cm/s LVOT diam:     1.70 cm  LV E/e' lateral: 11.9 LV SV:         71 LV SV Index:   45 LVOT Area:     2.27 cm  RIGHT VENTRICLE             IVC  RV S prime:     12.70 cm/s  IVC diam: 1.50 cm TAPSE (M-mode): 2.2 cm LEFT ATRIUM             Index       RIGHT ATRIUM           Index LA diam:        3.70 cm 2.32 cm/m  RA Area:     16.10 cm LA Vol (A2C):   42.4 ml 26.63 ml/m RA Volume:   45.10 ml  28.33 ml/m LA Vol (A4C):   43.6 ml 27.39 ml/m LA Biplane Vol: 43.6 ml 27.39 ml/m  AORTIC VALVE LVOT Vmax:   133.00 cm/s LVOT Vmean:  87.900 cm/s LVOT VTI:    0.315 m AI PHT:      586 msec  AORTA Ao Root diam: 3.00 cm Ao Asc diam:  3.10 cm MITRAL VALVE                TRICUSPID VALVE MV Area (PHT): 2.91 cm     TR Peak grad:   22.8 mmHg MV Decel Time: 261 msec     TR Vmax:        239.00 cm/s MV E velocity: 93.40 cm/s MV A velocity: 135.00 cm/s  SHUNTS MV E/A ratio:  0.69         Systemic VTI:  0.32 m                             Systemic Diam: 1.70 cm Mertie Moores MD Electronically signed by Mertie Moores MD Signature Date/Time: 06/08/2021/4:58:14 PM    Final         Scheduled Meds:   stroke: mapping our early stages of recovery book   Does not apply Once   aspirin EC  81 mg Oral Daily   atorvastatin  80 mg Oral Daily   baclofen  10 mg Oral TID   clopidogrel  75 mg Oral Daily   diclofenac Sodium  2 g Topical QID   enoxaparin (LOVENOX) injection  40 mg Subcutaneous Q24H   feeding supplement  237 mL Oral BID BM   levothyroxine  25 mcg Oral Q0600   methocarbamol  500 mg Oral TID   pantoprazole  40 mg Oral Daily   Continuous Infusions:  lactated ringers 75 mL/hr at 06/08/21 1220     LOS: 1 day      Phillips Climes, MD [facility]   To contact the attending provider between 7A-7P or the covering provider during after hours 7P-7A, please log into the web site [email or web address] and access using universal [place] password for that web site. If you do not have the password, please call the hospital operator.  06/09/2021, 11:55 AM

## 2021-06-10 LAB — BASIC METABOLIC PANEL
Anion gap: 8 (ref 5–15)
BUN: 18 mg/dL (ref 8–23)
CO2: 25 mmol/L (ref 22–32)
Calcium: 8.4 mg/dL — ABNORMAL LOW (ref 8.9–10.3)
Chloride: 104 mmol/L (ref 98–111)
Creatinine, Ser: 0.65 mg/dL (ref 0.44–1.00)
GFR, Estimated: >60 mL/min (ref >60)
Glucose, Bld: 102 mg/dL — ABNORMAL HIGH (ref 70–99)
Potassium: 3.8 mmol/L (ref 3.5–5.1)
Sodium: 137 mmol/L (ref 135–145)

## 2021-06-10 LAB — CBC
HCT: 35 % — ABNORMAL LOW (ref 36.0–46.0)
Hemoglobin: 12.1 g/dL (ref 12.0–15.0)
MCH: 31.5 pg (ref 26.0–34.0)
MCHC: 34.6 g/dL (ref 30.0–36.0)
MCV: 91.1 fL (ref 80.0–100.0)
Platelets: 179 10*3/uL (ref 150–400)
RBC: 3.84 MIL/uL — ABNORMAL LOW (ref 3.87–5.11)
RDW: 13 % (ref 11.5–15.5)
WBC: 8.2 10*3/uL (ref 4.0–10.5)
nRBC: 0 % (ref 0.0–0.2)

## 2021-06-10 LAB — PHOSPHORUS: Phosphorus: 1.9 mg/dL — ABNORMAL LOW (ref 2.5–4.6)

## 2021-06-10 MED ORDER — SODIUM PHOSPHATES 45 MMOLE/15ML IV SOLN
20.0000 mmol | Freq: Once | INTRAVENOUS | Status: AC
  Administered 2021-06-10: 20 mmol via INTRAVENOUS
  Filled 2021-06-10: qty 6.67

## 2021-06-10 MED ORDER — BISACODYL 10 MG RE SUPP
10.0000 mg | Freq: Every day | RECTAL | Status: DC | PRN
  Administered 2021-06-10: 10 mg via RECTAL
  Filled 2021-06-10: qty 1

## 2021-06-10 MED ORDER — POLYETHYLENE GLYCOL 3350 17 G PO PACK
17.0000 g | PACK | Freq: Every day | ORAL | Status: DC | PRN
  Administered 2021-06-10: 17 g via ORAL
  Filled 2021-06-10 (×2): qty 1

## 2021-06-10 NOTE — Care Management (Signed)
    Durable Medical Equipment  (From admission, onward)           Start     Ordered   06/10/21 1233  For home use only DME Hospital bed  Once       Comments: FULLY ELECTRIC BED  Question Answer Comment  Length of Need Lifetime   Patient has (list medical condition): stroke, weakness, dysphagia   The above medical condition requires: Patient requires the ability to reposition immediately   Head must be elevated greater than: 45 degrees   Bed type Semi-electric   Hoyer Lift Yes   Support Surface: Gel Overlay      06/10/21 1233

## 2021-06-10 NOTE — Progress Notes (Signed)
PROGRESS NOTE    [name]  EHM:[id number] DOB: May 27, 1932 DOA: 06/07/2021 PCP: [name], MD    Chief Complaint  Patient presents with   Facial Droop    Brief Narrative:   [name] is a 87 y.o. female with medical history significant of multiple sclerosis, hypothyroidism, hypertension, hyperlipidemia, rheumatoid arthritis, giant cell arteritis, falls, CHF, venous insufficiency, chronic edema, osteoporosis, acetabular fracture presents with new onset weakness. Patient presents with left-sided weakness, facial droop and slurred speech, MRI brain significant for acute/subacute infarct and right putamen extending into the corona radiata, she was admitted for CVA work-up  Assessment & Plan:   Principal Problem:   Acute CVA (cerebrovascular accident) [facility]) Active Problems:   Acetabular fracture ([place])   Hypertension   Hypothyroidism   Congestive heart failure ([place])   Multiple sclerosis ([place])   Chronic edema   Stroke ([place])  Acute CVA -Patient presents with left side weakness, dysphagia, MRI brain significant for right corona radiata stroke, CTA head and neck did not reveal any significant stenosis, 2D echo with preserved EF 60 to 65%. -LDL elevated at 156, continue with statin -Recommendation for dual antiplatelet therapy for 21 days, followed by aspirin monotherapy. -PT/OT consulted -Please see discussion below regarding dysphagia  Dysphagia -Patient failed bedside evaluation by SLP yesterday, went for MBS, currently on dysphagia with nectar thick .  To be followed closely by SLP over the weekend, her appetite and oral intake seems to be appropriate. -We will continue with IV fluids at 50 cc/h  MS Acetabular fracture - Some right-sided deficits due to prior unrepaired hip fracture.  Prior imaging shows healed acetabular fracture with superior migration of the false acetabulum.  Progressive avascular necrosis and collapse of the femoral head. -  Appreciate any recommendations neurology may have - Not currently on any medications for her MS per patient report, other than muscle relaxants - Continue baclofen and Robaxin   Diastolic CHF > Last echo in 2020 with EF 60-65%, G2 DD moderate aortic regurgitation moderate mitral regurgitation - Not currently on any diuretics   Hypertension - Holding home antihypertensives in setting of acute CVA as above, though she reports she is not currently taking losartan.  Hypothyroidism - Continue home Synthroid   Rheumatoid arthritis/Osteoarthritis -Patient/family had second opinion at [facility], and they reported she does not have an diagnosis rheumatoid arthritis, she is off immunotherapy currently .  MS  -  followed by Dr. Felecia Shelling  -MRI with/without contrast this admission does not show any new enhancement   Hard of hearing - Noted   Hypophosphatemia -Low at 1.9 today, repleted with IV sodium phosphate.  DVT prophylaxis: Lovenox Code Status: Full Family Communication: none at bedside today Disposition:   Status is: Inpatient  The patient will require care spanning > 2 midnights and should be moved to inpatient because: IV treatments appropriate due to intensity of illness or inability to take PO  Dispo: The patient is from: Home              Anticipated d/c is to: Home              Patient currently is not medically stable to d/c.     Difficult to place patient No       Consultants:  Neurology  Subjective:  Patient failed her initial swallowing evaluation, plan for MBS today, likely will require core track tube  Objective: Vitals:   06/09/21 1428 06/09/21 2110 06/09/21 2110 06/10/21 0544  BP: 123/68  Marland Kitchen)  126/48 (!) 165/55  Pulse: 61  61 (!) 59  Resp: 18     Temp: 98.2 F (36.8 C) 98.4 F (36.9 C)  (!) 97.4 F (36.3 C)  TempSrc: Axillary Oral  Oral  SpO2: 98%  96% 99%  Weight:      Height:        Intake/Output Summary (Last 24 hours) at 06/10/2021 1140 Last data  filed at 06/10/2021 4403 Gross per 24 hour  Intake 1385.98 ml  Output 650 ml  Net 735.98 ml   Filed Weights   06/07/21 1130  Weight: 54.4 kg    Examination:   Awake Alert, Oriented X 3, frail and deconditioned, remains with left-sided weakness . symmetrical Chest wall movement, Good air movement bilaterally, CTAB RRR,No Gallops,Rubs or new Murmurs, No Parasternal Heave +ve B.Sounds, Abd Soft, No tenderness, No rebound - guarding or rigidity. No Cyanosis, Clubbing or edema, No new Rash or bruise        Data Reviewed: I have personally reviewed following labs and imaging studies  CBC: Recent Labs  Lab 06/07/21 1141 06/07/21 1211 06/09/21 0302 06/10/21 0354  WBC 6.3  --  7.0 8.2  NEUTROABS 2.9  --   --   --   HGB 14.3 14.3 12.5 12.1  HCT 41.9 42.0 36.2 35.0*  MCV 93.1  --  91.9 91.1  PLT 201  --  180 474    Basic Metabolic Panel: Recent Labs  Lab 06/07/21 1141 06/07/21 1211 06/09/21 0302 06/10/21 0354  NA 133* 134* 134* 137  K 4.3 4.2 3.6 3.8  CL 99 99 101 104  CO2 22  --  23 25  GLUCOSE 99 104* 86 102*  BUN 15 19 14 18   CREATININE 0.68 0.60 0.69 0.65  CALCIUM 9.2  --  8.5* 8.4*  PHOS  --   --   --  1.9*    GFR: Estimated Creatinine Clearance: 43.4 mL/min (by C-G formula based on SCr of 0.65 mg/dL).  Liver Function Tests: Recent Labs  Lab 06/07/21 1141  AST 35  ALT 19  ALKPHOS 67  BILITOT 0.7  PROT 6.2*  ALBUMIN 3.9    CBG: Recent Labs  Lab 06/07/21 1129  GLUCAP 99     Recent Results (from the past 240 hour(s))  Resp Panel by RT-PCR (Flu A&B, Covid) Nasopharyngeal Swab     Status: None   Collection Time: 06/07/21  7:30 PM   Specimen: Nasopharyngeal Swab; Nasopharyngeal(NP) swabs in vial transport medium  Result Value Ref Range Status   SARS Coronavirus 2 by RT PCR NEGATIVE NEGATIVE Final    Comment: (NOTE) SARS-CoV-2 target nucleic acids are NOT DETECTED.  The SARS-CoV-2 RNA is generally detectable in upper respiratory specimens  during the acute phase of infection. The lowest concentration of SARS-CoV-2 viral copies this assay can detect is 138 copies/mL. A negative result does not preclude SARS-Cov-2 infection and should not be used as the sole basis for treatment or other patient management decisions. A negative result may occur with  improper specimen collection/handling, submission of specimen other than nasopharyngeal swab, presence of viral mutation(s) within the areas targeted by this assay, and inadequate number of viral copies(<138 copies/mL). A negative result must be combined with clinical observations, patient history, and epidemiological information. The expected result is Negative.  Fact Sheet for Patients:  EntrepreneurPulse.com.au  Fact Sheet for Healthcare Providers:  IncredibleEmployment.be  This test is no t yet approved or cleared by the [place] FDA and  has been  authorized for detection and/or diagnosis of SARS-CoV-2 by FDA under an Emergency Use Authorization (EUA). This EUA will remain  in effect (meaning this test can be used) for the duration of the COVID-19 declaration under Section 564(b)(1) of the Act, 21 U.S.C.section 360bbb-3(b)(1), unless the authorization is terminated  or revoked sooner.       Influenza A by PCR NEGATIVE NEGATIVE Final   Influenza B by PCR NEGATIVE NEGATIVE Final    Comment: (NOTE) The Xpert Xpress SARS-CoV-2/FLU/RSV plus assay is intended as an aid in the diagnosis of influenza from Nasopharyngeal swab specimens and should not be used as a sole basis for treatment. Nasal washings and aspirates are unacceptable for Xpert Xpress SARS-CoV-2/FLU/RSV testing.  Fact Sheet for Patients: EntrepreneurPulse.com.au  Fact Sheet for Healthcare Providers: IncredibleEmployment.be  This test is not yet approved or cleared by the [place] FDA and has been authorized for detection  and/or diagnosis of SARS-CoV-2 by FDA under an Emergency Use Authorization (EUA). This EUA will remain in effect (meaning this test can be used) for the duration of the COVID-19 declaration under Section 564(b)(1) of the Act, 21 U.S.C. section 360bbb-3(b)(1), unless the authorization is terminated or revoked.  Performed at [facility], [place] [address] [place], [place] [address]          Radiology Studies: DG Swallowing Func-Speech Pathology  Result Date: 06/08/2021 Formatting of this result is different from the original. Objective Swallowing Evaluation: Type of Study: MBS-Modified Barium Swallow Study  Patient Details Name: [name] MRN: [id number] Date of Birth: 1931/12/23 Today's Date: 06/08/2021 Time: SLP Start Time (ACUTE ONLY): 43 -SLP Stop Time (ACUTE ONLY): 0177 SLP Time Calculation (min) (ACUTE ONLY): 23 min Past Medical History: Past Medical History: Diagnosis Date  Chronic venous insufficiency of lower extremity 01/25/2019  With chronic bilateral lower extremity edema  Incontinent of urine   Multiple sclerosis ([place]) 2015  Osteoporosis   osteopenia  Polyarthritis   Post-menopausal   Rheumatoid arthritis ([place])   Temporal arteritis ([place]) 2011 Past Surgical History: Past Surgical History: Procedure Laterality Date  ABDOMINAL HYSTERECTOMY  1971  TVH  breast ductectomy Right 05/1995  COLONOSCOPY  10/2011 HPI: Pt is an 87 y.o. female who presented with left-sided weakness, facial droop, and slurred speech. MRI brain: Focus of restricted diffusion involving the right putamen extending into the corona radiata consistent with acute/subacute infarct. Pt failed the [place] since she stopped drinking. PMH: multiple sclerosis, hypothyroidism, hypertension, hyperlipidemia, rheumatoid arthritis, giant cell arteritis, falls, CHF, venous insufficiency, chronic edema, osteoporosis, acetabular fracture.  No data recorded Assessment / Plan / Recommendation CHL IP CLINICAL IMPRESSIONS 06/08/2021  Clinical Impression Pt presents with oropharyngeal dysphagia characterized by weak bolus manipulation, reduced labial seal, reduced posterior bolus propulsion, reduced lingual retraction, and reduced anterior laryngeal movement. She demonstrated difficulty with anterior spillage to the left, bolus formation, prolonged mastication, vallecular residue, pyriform sinus residue, penetration (PAS 3,5) and sensed aspiration (PAS 7) with thin liquids and intermittently with nectar thick liquids. Penetration and aspiration often occured after the swallow secondary to spillover of pyriform sinus residue to the larynx or due to subsequent aspiration of penetrated material. All instances of aspiration resulted in coughing which did mobilize the penetrate, but pt's cough was inadequately strong to expel penetrated/aspirated material. Compensatory strategies were ineffective in eliminating laryngeal invasion with thin liquids. No functional benefit was noted with a chin tuck posture or with effortful swallows with nectar thick liquids, but a left head turn posture consistently eliminated laryngeal invasion  with nectar thick liquids. Moderate vallecular residue was noted with full-tsp boluses and was eliminated with secondary swallows and/or a liquid wash. Pyriform sinus residue was eliminated with secondary swallows. Transit of the 40mm barium tablet (given with nectar thick liquids) was halted at the level of the pyriform sinuses, but transport was facilitated with use of a puree bolus and secondary swallows. A dysphagia 1 diet with nectar thick liquids is recommended at this time with strict observance of swallowing precautions. SLP will follow for dysphagia treatment. SLP Visit Diagnosis Dysphagia, oropharyngeal phase (R13.12) Attention and concentration deficit following -- Frontal lobe and executive function deficit following -- Impact on safety and function Moderate aspiration risk   CHL IP TREATMENT RECOMMENDATION 06/08/2021  Treatment Recommendations Therapy as outlined in treatment plan below   Prognosis 06/08/2021 Prognosis for Safe Diet Advancement Good Barriers to Reach Goals Severity of deficits Barriers/Prognosis Comment -- CHL IP DIET RECOMMENDATION 06/08/2021 SLP Diet Recommendations Dysphagia 1 (Puree) solids;Nectar thick liquid Liquid Administration via Cup;Straw Medication Administration Crushed with puree Compensations Slow rate;Small sips/bites;Follow solids with liquid Postural Changes Seated upright at 90 degrees   CHL IP OTHER RECOMMENDATIONS 06/08/2021 Recommended Consults -- Oral Care Recommendations Oral care BID;Patient independent with oral care Other Recommendations --   CHL IP FOLLOW UP RECOMMENDATIONS 06/08/2021 Follow up Recommendations Skilled Nursing facility   [facility] IP FREQUENCY AND DURATION 06/08/2021 Speech Therapy Frequency (ACUTE ONLY) min 2x/week Treatment Duration 2 weeks      CHL IP ORAL PHASE 06/08/2021 Oral Phase Impaired Oral - Pudding Teaspoon -- Oral - Pudding Cup -- Oral - Honey Teaspoon -- Oral - Honey Cup -- Oral - Nectar Teaspoon -- Oral - Nectar Cup Weak lingual manipulation;Reduced posterior propulsion;Left anterior bolus loss Oral - Nectar Straw Weak lingual manipulation;Reduced posterior propulsion;Left anterior bolus loss Oral - Thin Teaspoon -- Oral - Thin Cup Weak lingual manipulation;Reduced posterior propulsion;Left anterior bolus loss Oral - Thin Straw Weak lingual manipulation;Reduced posterior propulsion;Left anterior bolus loss Oral - Puree Weak lingual manipulation;Reduced posterior propulsion;Left anterior bolus loss;Lingual/palatal residue Oral - Mech Soft -- Oral - Regular Weak lingual manipulation;Reduced posterior propulsion;Impaired mastication;Delayed oral transit;Lingual/palatal residue Oral - Multi-Consistency -- Oral - Pill Weak lingual manipulation;Reduced posterior propulsion Oral Phase - Comment --  CHL IP PHARYNGEAL PHASE 06/08/2021 Pharyngeal Phase Impaired Pharyngeal- Pudding  Teaspoon -- Pharyngeal -- Pharyngeal- Pudding Cup -- Pharyngeal -- Pharyngeal- Honey Teaspoon -- Pharyngeal -- Pharyngeal- Honey Cup -- Pharyngeal -- Pharyngeal- Nectar Teaspoon -- Pharyngeal -- Pharyngeal- Nectar Cup Reduced anterior laryngeal mobility;Reduced tongue base retraction;Pharyngeal residue - valleculae;Pharyngeal residue - pyriform;Penetration/Apiration after swallow Pharyngeal Material enters airway, remains ABOVE vocal cords and not ejected out;Material enters airway, CONTACTS cords and not ejected out Pharyngeal- Nectar Straw Reduced anterior laryngeal mobility;Reduced tongue base retraction;Pharyngeal residue - valleculae;Pharyngeal residue - pyriform;Penetration/Apiration after swallow Pharyngeal Material enters airway, remains ABOVE vocal cords and not ejected out;Material enters airway, CONTACTS cords and not ejected out;Material enters airway, passes BELOW cords and not ejected out despite cough attempt by patient Pharyngeal- Thin Teaspoon -- Pharyngeal -- Pharyngeal- Thin Cup Reduced anterior laryngeal mobility;Reduced tongue base retraction;Pharyngeal residue - valleculae;Pharyngeal residue - pyriform;Penetration/Apiration after swallow Pharyngeal Material enters airway, remains ABOVE vocal cords and not ejected out;Material enters airway, CONTACTS cords and not ejected out;Material enters airway, passes BELOW cords and not ejected out despite cough attempt by patient Pharyngeal- Thin Straw Reduced anterior laryngeal mobility;Reduced tongue base retraction;Pharyngeal residue - valleculae;Pharyngeal residue - pyriform;Penetration/Apiration after swallow Pharyngeal Material enters airway, CONTACTS cords and not ejected out;Material  enters airway, passes BELOW cords and not ejected out despite cough attempt by patient Pharyngeal- Puree Reduced anterior laryngeal mobility;Reduced tongue base retraction;Pharyngeal residue - valleculae;Pharyngeal residue - pyriform Pharyngeal -- Pharyngeal-  Mechanical Soft -- Pharyngeal -- Pharyngeal- Regular Reduced anterior laryngeal mobility;Reduced tongue base retraction;Pharyngeal residue - valleculae;Pharyngeal residue - pyriform Pharyngeal -- Pharyngeal- Multi-consistency -- Pharyngeal -- Pharyngeal- Pill Reduced anterior laryngeal mobility;Reduced tongue base retraction;Pharyngeal residue - valleculae;Pharyngeal residue - pyriform Pharyngeal -- Pharyngeal Comment --  CHL IP CERVICAL ESOPHAGEAL PHASE 06/08/2021 Cervical Esophageal Phase WFL Pudding Teaspoon -- Pudding Cup -- Honey Teaspoon -- Honey Cup -- Nectar Teaspoon -- Nectar Cup -- Nectar Straw -- Thin Teaspoon -- Thin Cup -- Thin Straw -- Puree -- Mechanical Soft -- Regular -- Multi-consistency -- Pill -- Cervical Esophageal Comment -- Shanika I. Hardin Negus, [place], [place] Office number [phone] Pager [phone] [place] 06/08/2021, 2:44 PM              ECHOCARDIOGRAM COMPLETE  Result Date: 06/08/2021    ECHOCARDIOGRAM REPORT   Patient Name:   [name] [facility] Date of Exam: 06/08/2021 Medical Rec #:  [id number]              Height:       65.0 in Accession #:    [id number]             Weight:       120.0 lb Date of Birth:  02-20-1932              BSA:          1.592 m Patient Age:    24 years               BP:           147/69 mmHg Patient Gender: F                      HR:           58 bpm. Exam Location:  Inpatient Procedure: 2D Echo Indications:    stroke  History:        Patient has prior history of Echocardiogram examinations, most                 recent 12/16/2018. CHF, multiple sclerosis; Risk                 Factors:Hypertension.  Sonographer:    Johny Chess Referring Phys: [id number] [place]  1. Left ventricular ejection fraction, by estimation, is 60 to 65%. The left ventricle has normal function. The left ventricle has no regional wall motion abnormalities. Left ventricular diastolic parameters are indeterminate. Elevated left  ventricular end-diastolic pressure.  2. Right ventricular systolic function is normal. The right ventricular size is normal. There is normal pulmonary artery systolic pressure.  3. The mitral valve is normal in structure. Mild mitral valve regurgitation.  4. The aortic valve is normal in structure. Aortic valve regurgitation is mild. No aortic stenosis is present. FINDINGS  Left Ventricle: Left ventricular ejection fraction, by estimation, is 60 to 65%. The left ventricle has normal function. The left ventricle has no regional wall motion abnormalities. The left ventricular internal cavity size was normal in size. There is  no left ventricular hypertrophy. Left ventricular diastolic parameters are indeterminate. Elevated left ventricular end-diastolic pressure. Right Ventricle: The right ventricular size is normal. Right vetricular wall thickness was not well visualized. Right ventricular systolic function is normal.  There is normal pulmonary artery systolic pressure. The tricuspid regurgitant velocity is 2.39 m/s, and with an assumed right atrial pressure of 3 mmHg, the estimated right ventricular systolic pressure is 72.5 mmHg. Left Atrium: Left atrial size was normal in size. Right Atrium: Right atrial size was normal in size. Pericardium: There is no evidence of pericardial effusion. Mitral Valve: The mitral valve is normal in structure. Mild mitral valve regurgitation. Tricuspid Valve: The tricuspid valve is normal in structure. Tricuspid valve regurgitation is mild. Aortic Valve: The aortic valve is normal in structure. Aortic valve regurgitation is mild. Aortic regurgitation PHT measures 586 msec. No aortic stenosis is present. Pulmonic Valve: The pulmonic valve was grossly normal. Pulmonic valve regurgitation is not visualized. Aorta: The aortic root and ascending aorta are structurally normal, with no evidence of dilitation. IAS/Shunts: The atrial septum is grossly normal.  LEFT VENTRICLE PLAX 2D LVIDd:          3.70 cm  Diastology LVIDs:         2.50 cm  LV e' medial:    4.57 cm/s LV PW:         1.00 cm  LV E/e' medial:  20.4 LV IVS:        1.00 cm  LV e' lateral:   7.83 cm/s LVOT diam:     1.70 cm  LV E/e' lateral: 11.9 LV SV:         71 LV SV Index:   45 LVOT Area:     2.27 cm  RIGHT VENTRICLE             IVC RV S prime:     12.70 cm/s  IVC diam: 1.50 cm TAPSE (M-mode): 2.2 cm LEFT ATRIUM             Index       RIGHT ATRIUM           Index LA diam:        3.70 cm 2.32 cm/m  RA Area:     16.10 cm LA Vol (A2C):   42.4 ml 26.63 ml/m RA Volume:   45.10 ml  28.33 ml/m LA Vol (A4C):   43.6 ml 27.39 ml/m LA Biplane Vol: 43.6 ml 27.39 ml/m  AORTIC VALVE LVOT Vmax:   133.00 cm/s LVOT Vmean:  87.900 cm/s LVOT VTI:    0.315 m AI PHT:      586 msec  AORTA Ao Root diam: 3.00 cm Ao Asc diam:  3.10 cm MITRAL VALVE                TRICUSPID VALVE MV Area (PHT): 2.91 cm     TR Peak grad:   22.8 mmHg MV Decel Time: 261 msec     TR Vmax:        239.00 cm/s MV E velocity: 93.40 cm/s MV A velocity: 135.00 cm/s  SHUNTS MV E/A ratio:  0.69         Systemic VTI:  0.32 m                             Systemic Diam: 1.70 cm Mertie Moores MD Electronically signed by Mertie Moores MD Signature Date/Time: 06/08/2021/4:58:14 PM    Final         Scheduled Meds:   stroke: mapping our early stages of recovery book   Does not apply Once   aspirin EC  81 mg Oral Daily   atorvastatin  80 mg Oral Daily   baclofen  10 mg Oral TID   clopidogrel  75 mg Oral Daily   diclofenac Sodium  2 g Topical QID   enoxaparin (LOVENOX) injection  40 mg Subcutaneous Q24H   feeding supplement  237 mL Oral BID BM   levothyroxine  25 mcg Oral Q0600   methocarbamol  500 mg Oral TID   pantoprazole  40 mg Oral Daily   Continuous Infusions:  lactated ringers 50 mL/hr at 06/10/21 1046   sodium phosphate  Dextrose 5% IVPB 20 mmol (06/10/21 1052)     LOS: 2 days      Phillips Climes, MD [facility]   To contact the attending provider  between 7A-7P or the covering provider during after hours 7P-7A, please log into the web site [email or web address] and access using universal [place] password for that web site. If you do not have the password, please call the hospital operator.  06/10/2021, 11:40 AM

## 2021-06-10 NOTE — TOC Initial Note (Addendum)
Transition of Care [facility]) - Initial/Assessment Note    Patient Details  Name: [name] MRN: [id number] Date of Birth: May 08, 1932  Transition of Care [facility]) CM/SW Contact:    Carles Collet, RN Phone Number: 06/10/2021, 12:26 PM  Clinical Narrative:        Damaris Schooner w patient and son Gene at the bedside. They are all in agreement that she will return home at DC. She has HHA from 7-9 am and pm. Family will increase aid hours. She is active w [facility], confirmed w Corene Cornea, and they would like her to continue with current caregivers. Corene Cornea notified of potential DC on Tuesday with PT OT SLP needs.  She has WC, power wheelchair (she is unsatisfied with and working to get replaced through PCP), 3/1. Family identifies that she will need hoyer lift, and they are requesting electric hospital bed. Son Gene asks that delivery of equipment be set up through him, since no one is at the home to answer. CM assessing DME agencies available to deliver 7/5.  *Referral made to [employer] for DME Patient will need nonemergency transportation home. Address on file is correct.           Edward Jolly Daughter   [phone]  Rosemary Holms [phone]       Will need HH PT OT SLP orders with face to face.   Expected Discharge Plan: [place] Barriers to Discharge: Continued Medical Work up   Patient Goals and CMS Choice Patient states their goals for this hospitalization and ongoing recovery are:: to go home CMS Medicare.gov Compare Post Acute Care list provided to:: Patient Choice offered to / list presented to : NA  Expected Discharge Plan and Services Expected Discharge Plan: [place]   Discharge Planning Services: CM Consult Post Acute Care Choice: Home Health, Durable Medical Equipment Living arrangements for the past 2 months: Single Family Home                 DME Arranged:  (hoyer lift, electric hospital bed)         HH Arranged: PT, OT, Speech Therapy HH  Agency: [place]) Date HH Agency Contacted: 06/10/21 Time HH Agency Contacted: 1226 Representative spoke with at [place]: Corene Cornea  Prior Living Arrangements/Services Living arrangements for the past 2 months: [place] with:: Self   Do you feel safe going back to the place where you live?: Yes          Current home services: DME, Homehealth aide    Activities of Daily Living Home Assistive Devices/Equipment: Wheelchair, Shower chair with back, Transfer board ADL Screening (condition at time of admission) Patient's cognitive ability adequate to safely complete daily activities?: Yes Is the patient deaf or have difficulty hearing?: No Does the patient have difficulty seeing, even when wearing glasses/contacts?: No Does the patient have difficulty concentrating, remembering, or making decisions?: No Patient able to express need for assistance with ADLs?: No Does the patient have difficulty dressing or bathing?: No Independently performs ADLs?: No Communication: Independent Does the patient have difficulty walking or climbing stairs?: Yes Weakness of Legs: Both Weakness of Arms/Hands: Both  Permission Sought/Granted                  Emotional Assessment              Admission diagnosis:  Acute CVA (cerebrovascular accident) ([place]) [I63.9] Cerebrovascular accident (CVA), unspecified mechanism ([place]) [I63.9] Stroke [facility]: [facility]onda W) [I63.9] Patient  Active Problem List   Diagnosis Date Noted   Stroke ([place]) 06/08/2021   Acute CVA (cerebrovascular accident) ([place]) 06/07/2021   Atherosclerotic heart disease of native coronary artery without angina pectoris 04/13/2021   Chronic edema 04/13/2021   Difficulty in walking, not elsewhere classified 04/13/2021   Frail elderly 04/13/2021   Giant cell arteritis ([place]) 04/13/2021   Hypercholesterolemia 04/13/2021   Hypertensive heart disease without congestive heart failure 04/13/2021   Hypomagnesemia  04/13/2021   Osteoarthritis 04/13/2021   Protein calorie malnutrition ([place]) 04/13/2021   Age-related osteoporosis without current pathological fracture 12/13/2020   Congestive heart failure ([place]) 12/13/2020   Monoclonal paraproteinemia 12/13/2020   Multiple sclerosis ([place]) 12/13/2020   Unspecified abnormal finding in specimens from other organs, systems and tissues 12/13/2020   Vitamin D deficiency 12/13/2020   Hyponatremia 04/09/2020   Acetabular fracture ([place]) 04/01/2020   Closed right ischial fracture ([place]) 04/01/2020   Hypertension 04/01/2020   Hypothyroidism 04/01/2020   Frequent falls 03/23/2020   Tympanic membrane central perforation 12/15/2019   Cholesteatoma of external auditory canal, right 09/10/2019   Hearing loss 09/10/2019   Chronic venous insufficiency of lower extremity 01/25/2019   Grade II diastolic dysfunction 90/93/1121   Urinary dysfunction 12/22/2017   Multiple sclerosis, primary progressive ([place]) 06/19/2017   Left foot drop 03/17/2017   Primary chronic progressive multiple sclerosis ([place]) 03/17/2017   Myelopathy ([place]) 03/17/2017   Bilateral lower extremity edema 03/17/2017   PCP:  [name], MD Pharmacy:   [facility], [place] [address] Phone: [phone] Fax: [phone]     Social Determinants of Health (SDOH) Interventions    Readmission Risk Interventions Readmission Risk Prevention Plan 03/23/2020  Post Dischage Appt Complete  Medication Screening Complete  Transportation Screening Complete  Some recent data might be hidden

## 2021-06-11 LAB — CBC
HCT: 37.2 % (ref 36.0–46.0)
Hemoglobin: 13.2 g/dL (ref 12.0–15.0)
MCH: 32.3 pg (ref 26.0–34.0)
MCHC: 35.5 g/dL (ref 30.0–36.0)
MCV: 91 fL (ref 80.0–100.0)
Platelets: 197 10*3/uL (ref 150–400)
RBC: 4.09 MIL/uL (ref 3.87–5.11)
RDW: 13 % (ref 11.5–15.5)
WBC: 10.4 10*3/uL (ref 4.0–10.5)
nRBC: 0 % (ref 0.0–0.2)

## 2021-06-11 LAB — BASIC METABOLIC PANEL
Anion gap: 7 (ref 5–15)
BUN: 12 mg/dL (ref 8–23)
CO2: 24 mmol/L (ref 22–32)
Calcium: 8.3 mg/dL — ABNORMAL LOW (ref 8.9–10.3)
Chloride: 105 mmol/L (ref 98–111)
Creatinine, Ser: 0.56 mg/dL (ref 0.44–1.00)
GFR, Estimated: >60 mL/min (ref >60)
Glucose, Bld: 121 mg/dL — ABNORMAL HIGH (ref 70–99)
Potassium: 3.7 mmol/L (ref 3.5–5.1)
Sodium: 136 mmol/L (ref 135–145)

## 2021-06-11 LAB — PHOSPHORUS: Phosphorus: 2.1 mg/dL — ABNORMAL LOW (ref 2.5–4.6)

## 2021-06-11 MED ORDER — SODIUM PHOSPHATES 45 MMOLE/15ML IV SOLN: 20.0000 mmol | Freq: Once | INTRAVENOUS | Status: DC

## 2021-06-11 MED ORDER — FLEET ENEMA 7-19 GM/118ML RE ENEM: 1.0000 | ENEMA | Freq: Once | RECTAL | Status: DC

## 2021-06-11 MED ORDER — BISACODYL 5 MG PO TBEC: 10.0000 mg | DELAYED_RELEASE_TABLET | Freq: Once | ORAL | Status: DC

## 2021-06-11 MED ORDER — SENNOSIDES-DOCUSATE SODIUM 8.6-50 MG PO TABS
2.0000 | ORAL_TABLET | Freq: Two times a day (BID) | ORAL | Status: DC
  Administered 2021-06-12: 2 via ORAL
  Filled 2021-06-11 (×3): qty 2

## 2021-06-11 MED ORDER — BISACODYL 10 MG RE SUPP: 10.0000 mg | Freq: Once | RECTAL | Status: DC

## 2021-06-11 MED ORDER — SODIUM PHOSPHATES 45 MMOLE/15ML IV SOLN
30.0000 mmol | Freq: Once | INTRAVENOUS | Status: AC
  Administered 2021-06-11: 30 mmol via INTRAVENOUS
  Filled 2021-06-11: qty 10

## 2021-06-11 NOTE — Progress Notes (Signed)
  Speech Language Pathology Treatment: Dysphagia  Patient Details Name: [name] MRN: [id number] DOB: [birth year] Today's Date: 06/11/2021 Time: 7673-4193 SLP Time Calculation (min) (ACUTE ONLY): 16 min  Assessment / Plan / Recommendation Clinical Impression  Pt was seen for treatment which was abbreviated to allow pt to be cleaned. Pt reported that her swallow function and speech have been improving. Pt was able to verbalize swallowing precautions and she reported that she has been fed from the left which facilitates her consistently using the left head turn posture. She required cues for vocal intensity and overaticulation which were moderately effective in improving speech intelligibility. Pt's tolerance of thin liquids appears improved compared to 7/1 and she was able to tolerate a single swallow of thin liquids via straw with only delayed throat clearing. Pt reported that her discharge is planned for 7/5 and she verbalized agreement with home health SLP services for continued dysphagia and dysarthria treatment. SLP will continue to follow pt.     HPI HPI: Pt is an 87 y.o. female who presented with left-sided weakness, facial droop, and slurred speech. MRI brain: Focus of restricted diffusion involving the right putamen extending into the corona radiata consistent with acute/subacute infarct. Pt failed the Yale since she stopped drinking. PMH: multiple sclerosis, hypothyroidism, hypertension, hyperlipidemia, rheumatoid arthritis, giant cell arteritis, falls, CHF, venous insufficiency, chronic edema, osteoporosis, acetabular fracture.      SLP Plan  Continue with current plan of care       Recommendations  Diet recommendations: Dysphagia 1 (puree);Nectar-thick liquid Liquids provided via: Cup;Straw Medication Administration: Crushed with puree Supervision: Full supervision/cueing for compensatory strategies;Staff to assist with self feeding Compensations: Slow rate;Small  sips/bites;Follow solids with liquid (left head turn) Postural Changes and/or Swallow Maneuvers: Seated upright 90 degrees;Upright 30-60 min after meal;Head turn left during swallow                Oral Care Recommendations: Oral care BID Follow up Recommendations: Home health SLP SLP Visit Diagnosis: Dysphagia, oropharyngeal phase (R13.12) Plan: Continue with current plan of care       Flor Houdeshell I. Hardin Negus, [place], [place] Office number [phone] Pager [place] 06/11/2021, 11:19 AM

## 2021-06-11 NOTE — Progress Notes (Signed)
PROGRESS NOTE    [name]  NUU:[id number] DOB: [birth year] DOA: 06/07/2021 PCP: [name], MD    Chief Complaint  Patient presents with   Facial Droop    Brief Narrative:   [name] is a 87 y.o. female with medical history significant of multiple sclerosis, hypothyroidism, hypertension, hyperlipidemia, rheumatoid arthritis, giant cell arteritis, falls, CHF, venous insufficiency, chronic edema, osteoporosis, acetabular fracture presents with new onset weakness. Patient presents with left-sided weakness, facial droop and slurred speech, MRI brain significant for acute/subacute infarct and right putamen extending into the corona radiata, she was admitted for CVA work-up  Assessment & Plan:   Principal Problem:   Acute CVA (cerebrovascular accident) [facility]) Active Problems:   Acetabular fracture ([place])   Hypertension   Hypothyroidism   Congestive heart failure ([place])   Multiple sclerosis ([place])   Chronic edema   Stroke ([place])  Acute CVA -Patient presents with left side weakness, dysphagia, MRI brain significant for right corona radiata stroke, CTA head and neck did not reveal any significant stenosis, 2D echo with preserved EF 60 to 65%. -LDL elevated at 156, continue with statin -Recommendation for dual antiplatelet therapy for 21 days, followed by aspirin monotherapy. -PT/OT consulted, family do not want SNF, so plan is to go home with home health, case management is following closely. -Please see discussion below regarding dysphagia  Dysphagia -Evaluated by SLP today, she remains on dysphagia 2 with nectar thick, she will be reassessed tomorrow before discharge, but likely she will still need nectar thick, she is having good oral intake, she will be followed by SLP at home.    MS Acetabular fracture - Some right-sided deficits due to prior unrepaired hip fracture.  Prior imaging shows healed acetabular fracture with superior migration of the false  acetabulum.  Progressive avascular necrosis and collapse of the femoral head. - Appreciate any recommendations neurology may have - Not currently on any medications for her MS per patient report, other than muscle relaxants - Continue baclofen and Robaxin   Diastolic CHF -Last echo in 2020 with EF 60-65%, G2 DD moderate aortic regurgitation moderate mitral regurgitation - Not currently on any diuretics, no evidence of volume overload   Hypertension - Holding home antihypertensives in setting of acute CVA as above, though she reports she is not currently taking losartan.  Hypothyroidism - Continue home Synthroid   Rheumatoid arthritis/Osteoarthritis -Patient/family had second opinion at [facility], and they reported she does not have an diagnosis rheumatoid arthritis, she is off immunotherapy currently .  MS  -  followed by Dr. Felecia Shelling  -MRI with/without contrast this admission does not show any new enhancement   Hard of hearing - Noted  Constipation -Improved with bowel regimen   Hypophosphatemia -Repleted with IV sodium phosphate yesterday, remains low, will give another 20 mmol of IV sodium phosphate.  DVT prophylaxis: Lovenox Code Status: Full Family Communication: none at bedside today Disposition:   Status is: Inpatient  The patient will require care spanning > 2 midnights and should be moved to inpatient because: IV treatments appropriate due to intensity of illness or inability to take PO  Dispo: The patient is from: Home              Anticipated d/c is to: Home, son and daughter would like patient to be discharged home instead of SNF, case management/social worker is involved with planning to discharge home with maximum care, will include social work with home health as well in case patient is not  doing well home with home health and they want to proceed with SNF placement.              Patient currently is not medically stable to d/c.     Difficult to place patient  No       Consultants:  Neurology  Subjective:  No significant events overnight as discussed with staff, patient patient constipation has improved, had few bowel movements overnight as discussed with staff  Objective: Vitals:   06/10/21 1300 06/10/21 2157 06/11/21 0446 06/11/21 1242  BP: (!) 154/67 (!) 155/65 138/63 134/60  Pulse: 64 66 69 60  Resp: 16 14 14 16   Temp: 97.7 F (36.5 C) 99.5 F (37.5 C) 98.6 F (37 C) 98.9 F (37.2 C)  TempSrc: Oral Oral Oral Oral  SpO2: 97% 99% 94% 98%  Weight:      Height:        Intake/Output Summary (Last 24 hours) at 06/11/2021 1258 Last data filed at 06/11/2021 0830 Gross per 24 hour  Intake 1458 ml  Output 425 ml  Net 1033 ml   Filed Weights   06/07/21 1130  Weight: 54.4 kg    Examination:   Awake Alert, Oriented X 3, frail, deconditioned, with left-sided weakness. Symmetrical Chest wall movement, Good air movement bilaterally, CTAB RRR,No Gallops,Rubs or new Murmurs, No Parasternal Heave +ve B.Sounds, Abd Soft, No tenderness, No rebound - guarding or rigidity. No Cyanosis, Clubbing or edema, No new Rash or bruise        Data Reviewed: I have personally reviewed following labs and imaging studies  CBC: Recent Labs  Lab 06/07/21 1141 06/07/21 1211 06/09/21 0302 06/10/21 0354 06/11/21 0401  WBC 6.3  --  7.0 8.2 10.4  NEUTROABS 2.9  --   --   --   --   HGB 14.3 14.3 12.5 12.1 13.2  HCT 41.9 42.0 36.2 35.0* 37.2  MCV 93.1  --  91.9 91.1 91.0  PLT 201  --  180 179 638    Basic Metabolic Panel: Recent Labs  Lab 06/07/21 1141 06/07/21 1211 06/09/21 0302 06/10/21 0354 06/11/21 0401  NA 133* 134* 134* 137 136  K 4.3 4.2 3.6 3.8 3.7  CL 99 99 101 104 105  CO2 22  --  23 25 24   GLUCOSE 99 104* 86 102* 121*  BUN 15 19 14 18 12   CREATININE 0.68 0.60 0.69 0.65 0.56  CALCIUM 9.2  --  8.5* 8.4* 8.3*  PHOS  --   --   --  1.9* 2.1*    GFR: Estimated Creatinine Clearance: 43.4 mL/min (by C-G formula based on  SCr of 0.56 mg/dL).  Liver Function Tests: Recent Labs  Lab 06/07/21 1141  AST 35  ALT 19  ALKPHOS 67  BILITOT 0.7  PROT 6.2*  ALBUMIN 3.9    CBG: Recent Labs  Lab 06/07/21 1129  GLUCAP 99     Recent Results (from the past 240 hour(s))  Resp Panel by RT-PCR (Flu A&B, Covid) Nasopharyngeal Swab     Status: None   Collection Time: 06/07/21  7:30 PM   Specimen: Nasopharyngeal Swab; Nasopharyngeal(NP) swabs in vial transport medium  Result Value Ref Range Status   SARS Coronavirus 2 by RT PCR NEGATIVE NEGATIVE Final    Comment: (NOTE) SARS-CoV-2 target nucleic acids are NOT DETECTED.  The SARS-CoV-2 RNA is generally detectable in upper respiratory specimens during the acute phase of infection. The lowest concentration of SARS-CoV-2 viral copies this assay can detect  is 138 copies/mL. A negative result does not preclude SARS-Cov-2 infection and should not be used as the sole basis for treatment or other patient management decisions. A negative result may occur with  improper specimen collection/handling, submission of specimen other than nasopharyngeal swab, presence of viral mutation(s) within the areas targeted by this assay, and inadequate number of viral copies(<138 copies/mL). A negative result must be combined with clinical observations, patient history, and epidemiological information. The expected result is Negative.  Fact Sheet for Patients:  EntrepreneurPulse.com.au  Fact Sheet for Healthcare Providers:  IncredibleEmployment.be  This test is no t yet approved or cleared by the [place] FDA and  has been authorized for detection and/or diagnosis of SARS-CoV-2 by FDA under an Emergency Use Authorization (EUA). This EUA will remain  in effect (meaning this test can be used) for the duration of the COVID-19 declaration under Section 564(b)(1) of the Act, 21 U.S.C.section 360bbb-3(b)(1), unless the authorization is  terminated  or revoked sooner.       Influenza A by PCR NEGATIVE NEGATIVE Final   Influenza B by PCR NEGATIVE NEGATIVE Final    Comment: (NOTE) The Xpert Xpress SARS-CoV-2/FLU/RSV plus assay is intended as an aid in the diagnosis of influenza from Nasopharyngeal swab specimens and should not be used as a sole basis for treatment. Nasal washings and aspirates are unacceptable for Xpert Xpress SARS-CoV-2/FLU/RSV testing.  Fact Sheet for Patients: EntrepreneurPulse.com.au  Fact Sheet for Healthcare Providers: IncredibleEmployment.be  This test is not yet approved or cleared by the [place] FDA and has been authorized for detection and/or diagnosis of SARS-CoV-2 by FDA under an Emergency Use Authorization (EUA). This EUA will remain in effect (meaning this test can be used) for the duration of the COVID-19 declaration under Section 564(b)(1) of the Act, 21 U.S.C. section 360bbb-3(b)(1), unless the authorization is terminated or revoked.  Performed at [facility], [place] [address] [place], [place] [address]          Radiology Studies: No results found.      Scheduled Meds:   stroke: mapping our early stages of recovery book   Does not apply Once   aspirin EC  81 mg Oral Daily   atorvastatin  80 mg Oral Daily   baclofen  10 mg Oral TID   clopidogrel  75 mg Oral Daily   diclofenac Sodium  2 g Topical QID   enoxaparin (LOVENOX) injection  40 mg Subcutaneous Q24H   feeding supplement  237 mL Oral BID BM   levothyroxine  25 mcg Oral Q0600   methocarbamol  500 mg Oral TID   pantoprazole  40 mg Oral Daily   Continuous Infusions:  sodium phosphate  Dextrose 5% IVPB 30 mmol (06/11/21 0835)     LOS: 3 days      Phillips Climes, MD [facility]   To contact the attending provider between 7A-7P or the covering provider during after hours 7P-7A, please log into the web site [email or web address] and access using  universal [place] password for that web site. If you do not have the password, please call the hospital operator.  06/11/2021, 12:58 PM

## 2021-06-11 NOTE — Progress Notes (Signed)
  Speech Language Pathology Treatment: Dysphagia;Cognitive-Linquistic (Dysarthria)  Patient Details Name: [name] MRN: [id number] DOB: 04/22/1932 Today's Date: 06/11/2021 Time: 6568-1275 SLP Time Calculation (min) (ACUTE ONLY): 37 min  Assessment / Plan / Recommendation Clinical Impression  Pt was seen for treatment. She was alert and cooperative during the session. Pt reported that she tolerated lunch well. She tolerated dysphagia 2 solids without overt s/sx of aspiration. Mastication was [facility] and anterior spillage noted to the left. Pt's friend arrived during the session and brought the pt her favorite chocolate chunk cookie which was about dysphagia 3 consistency. Pt's use of left head turn was inconsistent with this despite her verbalization of all necessary compensatory strategies to her friend. The impact of distractions on this is considered. Pt demonstrated coughing with two of three trials of the soft-baked chocolate chunk cookie and ultimately stated, "I better not have anymore of that." Coughing and throat clearing continue to be inconsistent with thin liquids when left head turn is used, suggesting improved airway protection. Pt verbalized compensatory strategies for speech intelligibility to her friend and used them at the sentence level with 75% accuracy increasing to 100% with self-correction or cues for vocal intensity. Pt's daughter was contacted via phone and she was educated regarding pt's progress, diet recommendations, and various potential meals were discussed. SLP has left literature in the pt's room regarding the pt's recommended diet and thickened liquids. A dysphagia 2 diet with nectar thick liquids is recommended at this time with continued full supervision to ensure consistent use of swallowing precautions including the left head turn posture. SLP will continue to follow pt.    HPI HPI: Pt is an 87 y.o. female who presented with left-sided weakness, facial droop,  and slurred speech. MRI brain: Focus of restricted diffusion involving the right putamen extending into the corona radiata consistent with acute/subacute infarct. Pt failed the Yale since she stopped drinking. PMH: multiple sclerosis, hypothyroidism, hypertension, hyperlipidemia, rheumatoid arthritis, giant cell arteritis, falls, CHF, venous insufficiency, chronic edema, osteoporosis, acetabular fracture.      SLP Plan  Continue with current plan of care       Recommendations  Diet recommendations: Dysphagia 2 (fine chop);Nectar-thick liquid Liquids provided via: Cup;Straw Medication Administration: Crushed with puree Supervision: Full supervision/cueing for compensatory strategies;Staff to assist with self feeding Compensations: Slow rate;Small sips/bites;Follow solids with liquid (left head turn) Postural Changes and/or Swallow Maneuvers: Seated upright 90 degrees;Upright 30-60 min after meal;Head turn left during swallow                Oral Care Recommendations: Oral care BID Follow up Recommendations: Home health SLP SLP Visit Diagnosis: Dysphagia, oropharyngeal phase (R13.12) Plan: Continue with current plan of care       [name] I. [name], [name], [name] Office number [phone] Pager [phone]                [name] 06/11/2021, 4:27 PM

## 2021-06-12 DIAGNOSIS — I5030 Unspecified diastolic (congestive) heart failure: Secondary | ICD-10-CM

## 2021-06-12 MED ORDER — ACETAMINOPHEN 325 MG PO TABS
650.0000 mg | ORAL_TABLET | Freq: Four times a day (QID) | ORAL | Status: AC | PRN
Start: 2021-06-12 — End: ?

## 2021-06-12 MED ORDER — FOOD THICKENER (SIMPLYTHICK)
ORAL | 1 refills | Status: DC
Start: 2021-06-12 — End: 2023-03-28

## 2021-06-12 MED ORDER — PANTOPRAZOLE SODIUM 40 MG PO TBEC: 40.0000 mg | DELAYED_RELEASE_TABLET | Freq: Every day | ORAL | 1 refills | Status: DC

## 2021-06-12 MED ORDER — ASPIRIN 81 MG PO TBEC: 81.0000 mg | DELAYED_RELEASE_TABLET | Freq: Every day | ORAL | 2 refills | Status: DC

## 2021-06-12 MED ORDER — CLOPIDOGREL BISULFATE 75 MG PO TABS: 75.0000 mg | ORAL_TABLET | Freq: Every day | ORAL | 0 refills | Status: AC

## 2021-06-12 MED ORDER — ATORVASTATIN CALCIUM 80 MG PO TABS: 80.0000 mg | ORAL_TABLET | Freq: Every day | ORAL | 1 refills | Status: AC

## 2021-06-12 NOTE — Plan of Care (Signed)
  Problem: Clinical Measurements: Goal: Respiratory complications will improve Outcome: Progressing   Problem: Coping: Goal: Level of anxiety will decrease Outcome: Progressing   Problem: Elimination: Goal: Will not experience complications related to urinary retention Outcome: Progressing   Problem: Pain Managment: Goal: General experience of comfort will improve Outcome: Progressing   

## 2021-06-12 NOTE — TOC Transition Note (Addendum)
Transition of Care [facility]) - CM/SW Discharge Note   Patient Details  Name: [name] MRN: [id number] Date of Birth: 08-17-1932  [facility]) CM/SW Contact:  Joanne Chars, LCSW Phone Number: 06/12/2021, 1:05 PM   Clinical Narrative:   Pt discharging home with [place].  DME hoyer lift and hospital bed through [employer].  Pt to transport home with [facility], scheduled for 3pm pickup.  DME not yet in home but confirmed with Freda Munro that it will be there shortly, also confirmed with daughter that she will be there to receive it.  No other needs identified.     Final next level of care: [place] Barriers to Discharge: Barriers Resolved   Patient Goals and CMS Choice Patient states their goals for this hospitalization and ongoing recovery are:: to go home CMS Medicare.gov Compare Post Acute Care list provided to:: Patient Choice offered to / list presented to : NA  Discharge Placement                Patient to be transferred to facility by: Home with [place] Name of family member notified: daughter [name] Patient and family notified of of transfer: 06/12/21  Discharge Plan and Services   Discharge Planning Services: CM Consult Post Acute Care Choice: Home Health, Durable Medical Equipment          DME Arranged:  (hoyer lift, electric hospital bed) DME Agency: [facility] Date DME Agency Contacted: 06/10/21 Time DME Agency Contacted: [place] Representative spoke with at DME Agency: [place]: PT, OT, Speech Therapy [place] Agency: [place] ([place]) Date HH Agency Contacted: 06/10/21 Time [place]: 1226 Representative spoke with at [place]: [place] ([place]) Interventions     Readmission Risk Interventions Readmission Risk Prevention Plan 03/23/2020  Post Dischage Appt Complete  Medication Screening Complete  Transportation Screening Complete  Some recent data might be  hidden

## 2021-06-12 NOTE — Plan of Care (Signed)

## 2021-06-12 NOTE — Care Management Important Message (Signed)
Important Message  Patient Details  Name: [name] MRN: [id number] Date of Birth: [birth year]   Medicare Important Message Given:  Yes     [name] P Ward 06/12/2021, 3:17 PM

## 2021-06-12 NOTE — Discharge Summary (Signed)
Physician Discharge Summary  [name] PXT:[id number] DOB: 1932-10-26 DOA: 06/07/2021  PCP: [name], MD  Admit date: 06/07/2021 Discharge date: 06/12/2021  Admitted From: Home Disposition:Home  Recommendations for Outpatient Follow-up:  Follow up with PCP in 1-2 weeks Please obtain BMP/CBC in one week Continue following with SLP at home and advance diet as tolerated, she is currently on dysphagia 2 with nectar thick  Home Health: YES Equipment/Devices:YES  Discharge Condition:Stable CODE STATUS DNR Diet recommendation: Dysphagia 2 with nectar thick  Brief/Interim Summary:  [name] is a 87 y.o. female with medical history significant of multiple sclerosis, hypothyroidism, hypertension, hyperlipidemia, rheumatoid arthritis, giant cell arteritis, falls, CHF, venous insufficiency, chronic edema, osteoporosis, acetabular fracture presents with new onset weakness. Patient presents with left-sided weakness, facial droop and slurred speech, MRI brain significant for acute/subacute infarct and right putamen extending into the corona radiata, she was admitted for CVA work-up   Acute CVA -Patient presents with left side weakness, dysphagia, MRI brain significant for right corona radiata stroke, CTA head and neck did not reveal any significant stenosis, 2D echo with preserved EF 60 to 65%. -LDL elevated at 156, continue with statin -Recommendation for dual antiplatelet therapy aspirin and Plavix for 21 days, followed by aspirin monotherapy. -PT/OT consulted, family do not want SNF, so plan is to go home with home health, case management is following closely.  And home health has been arranged.  Discharge. -Please see discussion below regarding dysphagia   Dysphagia -She is followed by SLP closely, she remains on dysphagia 2 with nectar thick, she will be discharged with current recommendations, with close follow-up with SLP as an outpatient, to advance diet as  tolerated.      MS Acetabular fracture - Some right-sided deficits due to prior unrepaired hip fracture.  Prior imaging shows healed acetabular fracture with superior migration of the false acetabulum.  Progressive avascular necrosis and collapse of the femoral head. - Not currently on any medications for her MS per patient report, other than muscle relaxants, she is followed with Dr. Felecia Shelling - Continue baclofen and Robaxin  Ambulatory dysfunction -Patient with poor ambulatory status at baseline due to right hip fracture, and she is currently with left side weakness due to acute CVA, PT/OT will be arranged on discharge.   Chronic diastolic CHF -Last echo in 2020 with EF 60-65%, G2 DD moderate aortic regurgitation moderate mitral regurgitation - Not currently on any diuretics, no evidence of volume overload   Hypertension - Holding home antihypertensives in setting of acute CVA as above, though she reports she is not currently taking losartan.  Hypothyroidism - Continue home Synthroid   Rheumatoid arthritis/Osteoarthritis -Patient/family had second opinion at [facility], and they reported she does not have an diagnosis rheumatoid arthritis, she is off immunotherapy currently .   MS -  followed by Dr. Felecia Shelling -MRI with/without contrast this admission does not show any new enhancement   Hard of hearing - Noted   Constipation -Improved with bowel regimen   Hypophosphatemia -repleted  Discharge Diagnoses:  Principal Problem:   Acute CVA (cerebrovascular accident) ([place]) Active Problems:   Acetabular fracture ([place])   Hypertension   Hypothyroidism   Congestive heart failure ([place])   Multiple sclerosis ([place])   Chronic edema   Stroke [facility])    Discharge Instructions  Discharge Instructions     Ambulatory referral to Neurology   Complete by: As directed    Follow up with Dr. Felecia Shelling at [facility] in 4 weeks.  Patient is  Dr. Garth Bigness patient. Thanks.   Diet - low sodium heart healthy    Complete by: As directed    Discharge instructions   Complete by: As directed    Follow with Primary MD [name], MD in 7 days   Get CBC, CMP,  checked  by Primary MD next visit.    Activity: As tolerated with Full fall precautions use walker/cane & assistance as needed   Disposition Home    Diet: Dysphagia 2 with nectar thick, with feeding assistance and aspiration precautions.   On your next visit with your primary care physician please Get Medicines reviewed and adjusted.   Please request your Prim.MD to go over all Hospital Tests and Procedure/Radiological results at the follow up, please get all Hospital records sent to your Prim MD by signing hospital release before you go home.   If you experience worsening of your admission symptoms, develop shortness of breath, life threatening emergency, suicidal or homicidal thoughts you must seek medical attention immediately by calling 911 or calling your MD immediately  if symptoms less severe.  You Must read complete instructions/literature along with all the possible adverse reactions/side effects for all the Medicines you take and that have been prescribed to you. Take any new Medicines after you have completely understood and accpet all the possible adverse reactions/side effects.   Do not drive when taking Pain medications.    Do not take more than prescribed Pain, Sleep and Anxiety Medications  Special Instructions: If you have smoked or chewed Tobacco  in the last 2 yrs please stop smoking, stop any regular Alcohol  and or any Recreational drug use.  Wear Seat belts while driving.   Please note  You were cared for by a hospitalist during your hospital stay. If you have any questions about your discharge medications or the care you received while you were in the hospital after you are discharged, you can call the unit and asked to speak with the hospitalist on call if the hospitalist that took care of you is not  available. Once you are discharged, your primary care physician will handle any further medical issues. Please note that NO REFILLS for any discharge medications will be authorized once you are discharged, as it is imperative that you return to your primary care physician (or establish a relationship with a primary care physician if you do not have one) for your aftercare needs so that they can reassess your need for medications and monitor your lab values.   Increase activity slowly   Complete by: As directed       Allergies as of 06/12/2021       Reactions   Sulfasalazine Anaphylaxis   Aspirin Nausea Only   Penicillins Diarrhea   Sulfa Antibiotics    Sulfamethoxazole-trimethoprim Other (See Comments)        Medication List     TAKE these medications    acetaminophen 325 MG tablet Commonly known as: TYLENOL Take 2 tablets (650 mg total) by mouth every 6 (six) hours as needed for mild pain (or temp > 37.5 C (99.5 F)).   aspirin 81 MG EC tablet Take 1 tablet (81 mg total) by mouth daily. Swallow whole. Start taking on: June 13, 2021 What changed:  how much to take when to take this additional instructions   atorvastatin 80 MG tablet Commonly known as: LIPITOR Take 1 tablet (80 mg total) by mouth daily. Start taking on: June 13, 2021   baclofen 10 MG tablet  Commonly known as: LIORESAL Take 10 mg by mouth 3 (three) times daily.   Biotin 10000 MCG Tabs Take 1,000 mcg by mouth daily.   Calcium Carbonate-Vitamin D 600-200 MG-UNIT Caps Take 1 capsule by mouth daily.   Calcium-Vitamin D3 250-125 MG-UNIT Tabs Take 1 tablet by mouth 2 (two) times daily.   clopidogrel 75 MG tablet Commonly known as: PLAVIX Take 1 tablet (75 mg total) by mouth daily for 18 days. Start taking on: June 13, 2021   diclofenac Sodium 1 % Gel Commonly known as: Voltaren APPLY 2 GMS TOPICALLY TO AFFECTED AREAS (RIGHT HIP) TWICE DAILY AS NEEDED FOR PAIN What changed:  how much to take how to  take this when to take this reasons to take this additional instructions   food thickener Gel Commonly known as: SIMPLYTHICK (NECTAR/LEVEL 2/MILDLY THICK) Please use with all fluids for nectar thick consistency   ICAPS AREDS FORMULA PO Take 1 capsule by mouth daily.   levothyroxine 25 MCG tablet Commonly known as: SYNTHROID Take 1 tablet (25 mcg total) by mouth daily.   LUBRICATING EYE DROPS OP Place 1 drop into both eyes 2 (two) times daily. For Eye Dryness   Magnesium 250 MG Tabs Take 1 tablet by mouth daily.   nabumetone 500 MG tablet Commonly known as: Relafen Take 1 tablet (500 mg total) by mouth 2 (two) times daily as needed. What changed: reasons to take this   pantoprazole 40 MG tablet Commonly known as: PROTONIX Take 1 tablet (40 mg total) by mouth daily. Start taking on: June 13, 2021   Vitamin D (Ergocalciferol) 1.25 MG (50000 UNIT) Caps capsule Commonly known as: DRISDOL Take 50,000 Units by mouth every 7 (seven) days.   vitamin E 45 MG (100 UNITS) capsule Take 100 Units by mouth daily.               Durable Medical Equipment  (From admission, onward)           Start     Ordered   06/10/21 1233  For home use only DME Hospital bed  Once       Comments: FULLY ELECTRIC BED  Question Answer Comment  Length of Need Lifetime   Patient has (list medical condition): stroke, weakness, dysphagia   The above medical condition requires: Patient requires the ability to reposition immediately   Head must be elevated greater than: 45 degrees   Bed type Semi-electric   Hoyer Lift Yes   Support Surface: Gel Overlay      06/10/21 1233            Follow-up Information     [name], Nanine Means, MD. Schedule an appointment as soon as possible for a visit in 1 month(s).   Specialty: Neurology Contact information: [place] [address] [phone]         [name], MD Follow up in 1 week(s).   Specialty: Family  Medicine Contact information: [address] [place] [address] [phone]                Allergies  Allergen Reactions   Sulfasalazine Anaphylaxis   Aspirin Nausea Only   Penicillins Diarrhea   Sulfa Antibiotics    Sulfamethoxazole-Trimethoprim Other (See Comments)    Consultations: neurology   Procedures/Studies: CT Angio Head W or Wo Contrast  Result Date: 06/07/2021 CLINICAL DATA:  Neuro deficit, acute, stroke suspected EXAM: CT ANGIOGRAPHY HEAD AND NECK TECHNIQUE: Multidetector CT imaging of the head and  neck was performed using the standard protocol during bolus administration of intravenous contrast. Multiplanar CT image reconstructions and MIPs were obtained to evaluate the vascular anatomy. Carotid stenosis measurements (when applicable) are obtained utilizing NASCET criteria, using the distal internal carotid diameter as the denominator. CONTRAST:  28mL OMNIPAQUE IOHEXOL 350 MG/ML SOLN COMPARISON:  None. FINDINGS: CTA NECK FINDINGS SKELETON: There is no bony spinal canal stenosis. No lytic or blastic lesion. OTHER NECK: Normal pharynx, larynx and major salivary glands. No cervical lymphadenopathy. Unremarkable thyroid gland. UPPER CHEST: No pneumothorax or pleural effusion. No nodules or masses. AORTIC ARCH: There is no calcific atherosclerosis of the aortic arch. There is no aneurysm, dissection or hemodynamically significant stenosis of the visualized portion of the aorta. Conventional 3 vessel aortic branching pattern. The visualized proximal subclavian arteries are widely patent. RIGHT CAROTID SYSTEM: Normal without aneurysm, dissection or stenosis. LEFT CAROTID SYSTEM: Normal without aneurysm, dissection or stenosis. VERTEBRAL ARTERIES: Right dominant configuration. Both origins are clearly patent. There is no dissection, occlusion or flow-limiting stenosis to the skull base (V1-V3 segments). CTA HEAD FINDINGS POSTERIOR CIRCULATION: --Vertebral  arteries: Left vertebral artery terminates in PICA. Normal right vertebral artery V4 segment --Inferior cerebellar arteries: Normal. --Basilar artery: Normal. --Superior cerebellar arteries: Normal. --Posterior cerebral arteries (PCA): Normal. ANTERIOR CIRCULATION: --Intracranial internal carotid arteries: Normal. --Anterior cerebral arteries (ACA): Normal. Absent left A1 segment, normal variant --Middle cerebral arteries (MCA): Normal. VENOUS SINUSES: As permitted by contrast timing, patent. ANATOMIC VARIANTS: Persistent trigeminal artery. Absent left A1 segment. Review of the MIP images confirms the above findings. IMPRESSION: 1. No emergent large vessel occlusion or high-grade stenosis of the intracranial arteries. 2. Persistent trigeminal artery, normal variant. Electronically Signed   By: Ulyses Jarred M.D.   On: 06/07/2021 19:55   CT HEAD WO CONTRAST  Result Date: 06/07/2021 CLINICAL DATA:  Neuro deficit, acute stroke suspected. EXAM: CT HEAD WITHOUT CONTRAST TECHNIQUE: Contiguous axial images were obtained from the base of the skull through the vertex without intravenous contrast. COMPARISON:  MRI Apr 20, 2021.  CT head April 03, 2010. FINDINGS: Brain: No evidence of acute large vascular territory infarction, hemorrhage, hydrocephalus, extra-axial collection or mass lesion/mass effect. Moderate patchy white matter hypoattenuation. Generalized atrophy with ex vacuo ventricular dilation. Vascular: No hyperdense vessel identified. Calcific atherosclerosis. Skull: No acute fracture. Sinuses/Orbits: Visualized sinuses are largely clear. No acute orbital findings. Other: No mastoid effusions. IMPRESSION: 1. No evidence of acute large vascular territory infarct or acute hemorrhage. 2. Moderate patchy white matter hypoattenuation, which could represent chronic microvascular ischemic disease and/or demyelination in this patient with a history of multiple sclerosis. An MRI with contrast could provide more sensitive  evaluation for acute infarct or active demyelination if clinically indicated. Electronically Signed   By: Margaretha Sheffield MD   On: 06/07/2021 14:39   CT Angio Neck W and/or Wo Contrast  Result Date: 06/07/2021 CLINICAL DATA:  Neuro deficit, acute, stroke suspected EXAM: CT ANGIOGRAPHY HEAD AND NECK TECHNIQUE: Multidetector CT imaging of the head and neck was performed using the standard protocol during bolus administration of intravenous contrast. Multiplanar CT image reconstructions and MIPs were obtained to evaluate the vascular anatomy. Carotid stenosis measurements (when applicable) are obtained utilizing NASCET criteria, using the distal internal carotid diameter as the denominator. CONTRAST:  39mL OMNIPAQUE IOHEXOL 350 MG/ML SOLN COMPARISON:  None. FINDINGS: CTA NECK FINDINGS SKELETON: There is no bony spinal canal stenosis. No lytic or blastic lesion. OTHER NECK: Normal pharynx, larynx and major salivary glands. No cervical lymphadenopathy. Unremarkable  thyroid gland. UPPER CHEST: No pneumothorax or pleural effusion. No nodules or masses. AORTIC ARCH: There is no calcific atherosclerosis of the aortic arch. There is no aneurysm, dissection or hemodynamically significant stenosis of the visualized portion of the aorta. Conventional 3 vessel aortic branching pattern. The visualized proximal subclavian arteries are widely patent. RIGHT CAROTID SYSTEM: Normal without aneurysm, dissection or stenosis. LEFT CAROTID SYSTEM: Normal without aneurysm, dissection or stenosis. VERTEBRAL ARTERIES: Right dominant configuration. Both origins are clearly patent. There is no dissection, occlusion or flow-limiting stenosis to the skull base (V1-V3 segments). CTA HEAD FINDINGS POSTERIOR CIRCULATION: --Vertebral arteries: Left vertebral artery terminates in PICA. Normal right vertebral artery V4 segment --Inferior cerebellar arteries: Normal. --Basilar artery: Normal. --Superior cerebellar arteries: Normal. --Posterior  cerebral arteries (PCA): Normal. ANTERIOR CIRCULATION: --Intracranial internal carotid arteries: Normal. --Anterior cerebral arteries (ACA): Normal. Absent left A1 segment, normal variant --Middle cerebral arteries (MCA): Normal. VENOUS SINUSES: As permitted by contrast timing, patent. ANATOMIC VARIANTS: Persistent trigeminal artery. Absent left A1 segment. Review of the MIP images confirms the above findings. IMPRESSION: 1. No emergent large vessel occlusion or high-grade stenosis of the intracranial arteries. 2. Persistent trigeminal artery, normal variant. Electronically Signed   By: Ulyses Jarred M.D.   On: 06/07/2021 19:55   MR Brain W and Wo Contrast  Result Date: 06/07/2021 CLINICAL DATA:  Neuro deficit, acute, stroke suspected. EXAM: MRI HEAD WITHOUT AND WITH CONTRAST TECHNIQUE: Multiplanar, multiecho pulse sequences of the brain and surrounding structures were obtained without and with intravenous contrast. CONTRAST:  27mL GADAVIST GADOBUTROL 1 MMOL/ML IV SOLN COMPARISON:  Head CT June 07, 2021.  MRI of the brain Apr 20, 2021. FINDINGS: Brain: Small area of restricted diffusion extending from the posterior aspect of the right putamen into the corona radiata, consistent with acute/subacute infarct. No hemorrhage, hydrocephalus, extra-axial collection or mass lesion. Remote lacunar infarct in the right cerebellar hemisphere scattered and confluent foci of T2 hyperintensity are seen within the white matter of the cerebral hemispheres and within the pons, similar to prior MRI. No focus of abnormal contrast enhancement. Moderate parenchymal volume loss. Vascular: Normal flow voids. Skull and upper cervical spine: Normal marrow signal. Sinuses/Orbits: Negative. IMPRESSION: 1. Focus of restricted diffusion involving the right putamen extending into the corona radiata consistent with acute/subacute infarct. 2. Moderate chronic white matter lesions, likely a combination of demyelinating disease and chronic  microvascular ischemia, unchanged from prior MRI. No focus of abnormal contrast enhancement to suggest active demyelination. 3. Remote lacunar infarct in the right cerebellar hemisphere. 4. Moderate parenchymal volume loss. Electronically Signed   By: Pedro Earls M.D.   On: 06/07/2021 16:37   DG Swallowing Func-Speech Pathology  Result Date: 06/08/2021 Formatting of this result is different from the original. Objective Swallowing Evaluation: Type of Study: MBS-Modified Barium Swallow Study  Patient Details Name: Tatiyana Foucher MRN: [id number] Date of Birth: 1932-10-20 Today's Date: 06/08/2021 Time: SLP Start Time (ACUTE ONLY): 71 -SLP Stop Time (ACUTE ONLY): 0998 SLP Time Calculation (min) (ACUTE ONLY): 23 min Past Medical History: Past Medical History: Diagnosis Date  Chronic venous insufficiency of lower extremity 01/25/2019  With chronic bilateral lower extremity edema  Incontinent of urine   Multiple sclerosis ([place]) 2015  Osteoporosis   osteopenia  Polyarthritis   Post-menopausal   Rheumatoid arthritis ([place])   Temporal arteritis ([place]) 2011 Past Surgical History: Past Surgical History: Procedure Laterality Date  ABDOMINAL HYSTERECTOMY  1971  TVH  breast ductectomy Right 05/1995  COLONOSCOPY  10/2011 HPI: Pt  is an 87 y.o. female who presented with left-sided weakness, facial droop, and slurred speech. MRI brain: Focus of restricted diffusion involving the right putamen extending into the corona radiata consistent with acute/subacute infarct. Pt failed the [place] since she stopped drinking. PMH: multiple sclerosis, hypothyroidism, hypertension, hyperlipidemia, rheumatoid arthritis, giant cell arteritis, falls, CHF, venous insufficiency, chronic edema, osteoporosis, acetabular fracture.  No data recorded Assessment / Plan / Recommendation CHL IP CLINICAL IMPRESSIONS 06/08/2021 Clinical Impression Pt presents with oropharyngeal dysphagia characterized by weak bolus manipulation, reduced labial  seal, reduced posterior bolus propulsion, reduced lingual retraction, and reduced anterior laryngeal movement. She demonstrated difficulty with anterior spillage to the left, bolus formation, prolonged mastication, vallecular residue, pyriform sinus residue, penetration (PAS 3,5) and sensed aspiration (PAS 7) with thin liquids and intermittently with nectar thick liquids. Penetration and aspiration often occured after the swallow secondary to spillover of pyriform sinus residue to the larynx or due to subsequent aspiration of penetrated material. All instances of aspiration resulted in coughing which did mobilize the penetrate, but pt's cough was inadequately strong to expel penetrated/aspirated material. Compensatory strategies were ineffective in eliminating laryngeal invasion with thin liquids. No functional benefit was noted with a chin tuck posture or with effortful swallows with nectar thick liquids, but a left head turn posture consistently eliminated laryngeal invasion with nectar thick liquids. Moderate vallecular residue was noted with full-tsp boluses and was eliminated with secondary swallows and/or a liquid wash. Pyriform sinus residue was eliminated with secondary swallows. Transit of the 37mm barium tablet (given with nectar thick liquids) was halted at the level of the pyriform sinuses, but transport was facilitated with use of a puree bolus and secondary swallows. A dysphagia 1 diet with nectar thick liquids is recommended at this time with strict observance of swallowing precautions. SLP will follow for dysphagia treatment. SLP Visit Diagnosis Dysphagia, oropharyngeal phase (R13.12) Attention and concentration deficit following -- Frontal lobe and executive function deficit following -- Impact on safety and function Moderate aspiration risk   CHL IP TREATMENT RECOMMENDATION 06/08/2021 Treatment Recommendations Therapy as outlined in treatment plan below   Prognosis 06/08/2021 Prognosis for Safe Diet  Advancement Good Barriers to Reach Goals Severity of deficits Barriers/Prognosis Comment -- CHL IP DIET RECOMMENDATION 06/08/2021 SLP Diet Recommendations Dysphagia 1 (Puree) solids;Nectar thick liquid Liquid Administration via Cup;Straw Medication Administration Crushed with puree Compensations Slow rate;Small sips/bites;Follow solids with liquid Postural Changes Seated upright at 90 degrees   CHL IP OTHER RECOMMENDATIONS 06/08/2021 Recommended Consults -- Oral Care Recommendations Oral care BID;Patient independent with oral care Other Recommendations --   CHL IP FOLLOW UP RECOMMENDATIONS 06/08/2021 Follow up Recommendations Skilled Nursing facility   [facility] IP FREQUENCY AND DURATION 06/08/2021 Speech Therapy Frequency (ACUTE ONLY) min 2x/week Treatment Duration 2 weeks      CHL IP ORAL PHASE 06/08/2021 Oral Phase Impaired Oral - Pudding Teaspoon -- Oral - Pudding Cup -- Oral - Honey Teaspoon -- Oral - Honey Cup -- Oral - Nectar Teaspoon -- Oral - Nectar Cup Weak lingual manipulation;Reduced posterior propulsion;Left anterior bolus loss Oral - Nectar Straw Weak lingual manipulation;Reduced posterior propulsion;Left anterior bolus loss Oral - Thin Teaspoon -- Oral - Thin Cup Weak lingual manipulation;Reduced posterior propulsion;Left anterior bolus loss Oral - Thin Straw Weak lingual manipulation;Reduced posterior propulsion;Left anterior bolus loss Oral - Puree Weak lingual manipulation;Reduced posterior propulsion;Left anterior bolus loss;Lingual/palatal residue Oral - Mech Soft -- Oral - Regular Weak lingual manipulation;Reduced posterior propulsion;Impaired mastication;Delayed oral transit;Lingual/palatal residue Oral - Multi-Consistency -- Oral - Pill  Weak lingual manipulation;Reduced posterior propulsion Oral Phase - Comment --  CHL IP PHARYNGEAL PHASE 06/08/2021 Pharyngeal Phase Impaired Pharyngeal- Pudding Teaspoon -- Pharyngeal -- Pharyngeal- Pudding Cup -- Pharyngeal -- Pharyngeal- Honey Teaspoon -- Pharyngeal --  Pharyngeal- Honey Cup -- Pharyngeal -- Pharyngeal- Nectar Teaspoon -- Pharyngeal -- Pharyngeal- Nectar Cup Reduced anterior laryngeal mobility;Reduced tongue base retraction;Pharyngeal residue - valleculae;Pharyngeal residue - pyriform;Penetration/Apiration after swallow Pharyngeal Material enters airway, remains ABOVE vocal cords and not ejected out;Material enters airway, CONTACTS cords and not ejected out Pharyngeal- Nectar Straw Reduced anterior laryngeal mobility;Reduced tongue base retraction;Pharyngeal residue - valleculae;Pharyngeal residue - pyriform;Penetration/Apiration after swallow Pharyngeal Material enters airway, remains ABOVE vocal cords and not ejected out;Material enters airway, CONTACTS cords and not ejected out;Material enters airway, passes BELOW cords and not ejected out despite cough attempt by patient Pharyngeal- Thin Teaspoon -- Pharyngeal -- Pharyngeal- Thin Cup Reduced anterior laryngeal mobility;Reduced tongue base retraction;Pharyngeal residue - valleculae;Pharyngeal residue - pyriform;Penetration/Apiration after swallow Pharyngeal Material enters airway, remains ABOVE vocal cords and not ejected out;Material enters airway, CONTACTS cords and not ejected out;Material enters airway, passes BELOW cords and not ejected out despite cough attempt by patient Pharyngeal- Thin Straw Reduced anterior laryngeal mobility;Reduced tongue base retraction;Pharyngeal residue - valleculae;Pharyngeal residue - pyriform;Penetration/Apiration after swallow Pharyngeal Material enters airway, CONTACTS cords and not ejected out;Material enters airway, passes BELOW cords and not ejected out despite cough attempt by patient Pharyngeal- Puree Reduced anterior laryngeal mobility;Reduced tongue base retraction;Pharyngeal residue - valleculae;Pharyngeal residue - pyriform Pharyngeal -- Pharyngeal- Mechanical Soft -- Pharyngeal -- Pharyngeal- Regular Reduced anterior laryngeal mobility;Reduced tongue base  retraction;Pharyngeal residue - valleculae;Pharyngeal residue - pyriform Pharyngeal -- Pharyngeal- Multi-consistency -- Pharyngeal -- Pharyngeal- Pill Reduced anterior laryngeal mobility;Reduced tongue base retraction;Pharyngeal residue - valleculae;Pharyngeal residue - pyriform Pharyngeal -- Pharyngeal Comment --  CHL IP CERVICAL ESOPHAGEAL PHASE 06/08/2021 Cervical Esophageal Phase WFL Pudding Teaspoon -- Pudding Cup -- Honey Teaspoon -- Honey Cup -- Nectar Teaspoon -- Nectar Cup -- Nectar Straw -- Thin Teaspoon -- Thin Cup -- Thin Straw -- Puree -- Mechanical Soft -- Regular -- Multi-consistency -- Pill -- Cervical Esophageal Comment -- Shanika I. Hardin Negus, [place], [place] Office number [phone] Pager [phone] Horton Marshall 06/08/2021, 2:44 PM              ECHOCARDIOGRAM COMPLETE  Result Date: 06/08/2021    ECHOCARDIOGRAM REPORT   Patient Name:   KAYLEIGH BROADWELL  [facility] Date of Exam: 06/08/2021 Medical Rec #:  [id number]              Height:       65.0 in Accession #:    [id number]             Weight:       120.0 lb Date of Birth:  05/08/1932              BSA:          1.592 m Patient Age:    32 years               BP:           147/69 mmHg Patient Gender: F                      HR:           58 bpm. Exam Location:  Inpatient Procedure: 2D Echo Indications:    stroke  History:        Patient has prior history of Echocardiogram  examinations, most                 recent 12/16/2018. CHF, multiple sclerosis; Risk                 Factors:Hypertension.  Sonographer:    Johny Chess Referring Phys: [id number] [place]  1. Left ventricular ejection fraction, by estimation, is 60 to 65%. The left ventricle has normal function. The left ventricle has no regional wall motion abnormalities. Left ventricular diastolic parameters are indeterminate. Elevated left ventricular end-diastolic pressure.  2. Right ventricular systolic function is normal. The right ventricular  size is normal. There is normal pulmonary artery systolic pressure.  3. The mitral valve is normal in structure. Mild mitral valve regurgitation.  4. The aortic valve is normal in structure. Aortic valve regurgitation is mild. No aortic stenosis is present. FINDINGS  Left Ventricle: Left ventricular ejection fraction, by estimation, is 60 to 65%. The left ventricle has normal function. The left ventricle has no regional wall motion abnormalities. The left ventricular internal cavity size was normal in size. There is  no left ventricular hypertrophy. Left ventricular diastolic parameters are indeterminate. Elevated left ventricular end-diastolic pressure. Right Ventricle: The right ventricular size is normal. Right vetricular wall thickness was not well visualized. Right ventricular systolic function is normal. There is normal pulmonary artery systolic pressure. The tricuspid regurgitant velocity is 2.39 m/s, and with an assumed right atrial pressure of 3 mmHg, the estimated right ventricular systolic pressure is 34.2 mmHg. Left Atrium: Left atrial size was normal in size. Right Atrium: Right atrial size was normal in size. Pericardium: There is no evidence of pericardial effusion. Mitral Valve: The mitral valve is normal in structure. Mild mitral valve regurgitation. Tricuspid Valve: The tricuspid valve is normal in structure. Tricuspid valve regurgitation is mild. Aortic Valve: The aortic valve is normal in structure. Aortic valve regurgitation is mild. Aortic regurgitation PHT measures 586 msec. No aortic stenosis is present. Pulmonic Valve: The pulmonic valve was grossly normal. Pulmonic valve regurgitation is not visualized. Aorta: The aortic root and ascending aorta are structurally normal, with no evidence of dilitation. IAS/Shunts: The atrial septum is grossly normal.  LEFT VENTRICLE PLAX 2D LVIDd:         3.70 cm  Diastology LVIDs:         2.50 cm  LV e' medial:    4.57 cm/s LV PW:         1.00 cm  LV E/e'  medial:  20.4 LV IVS:        1.00 cm  LV e' lateral:   7.83 cm/s LVOT diam:     1.70 cm  LV E/e' lateral: 11.9 LV SV:         71 LV SV Index:   45 LVOT Area:     2.27 cm  RIGHT VENTRICLE             IVC RV S prime:     12.70 cm/s  IVC diam: 1.50 cm TAPSE (M-mode): 2.2 cm LEFT ATRIUM             Index       RIGHT ATRIUM           Index LA diam:        3.70 cm 2.32 cm/m  RA Area:     16.10 cm LA Vol (A2C):   42.4 ml 26.63 ml/m RA Volume:   45.10 ml  28.33 ml/m LA Vol (A4C):  43.6 ml 27.39 ml/m LA Biplane Vol: 43.6 ml 27.39 ml/m  AORTIC VALVE LVOT Vmax:   133.00 cm/s LVOT Vmean:  87.900 cm/s LVOT VTI:    0.315 m AI PHT:      586 msec  AORTA Ao Root diam: 3.00 cm Ao Asc diam:  3.10 cm MITRAL VALVE                TRICUSPID VALVE MV Area (PHT): 2.91 cm     TR Peak grad:   22.8 mmHg MV Decel Time: 261 msec     TR Vmax:        239.00 cm/s MV E velocity: 93.40 cm/s MV A velocity: 135.00 cm/s  SHUNTS MV E/A ratio:  0.69         Systemic VTI:  0.32 m                             Systemic Diam: 1.70 cm Mertie Moores MD Electronically signed by Mertie Moores MD Signature Date/Time: 06/08/2021/4:58:14 PM    Final      Subjective:  She denies any complaints today, reports good bowel movement yesterday, reports some improvement in her right upper extremity weakness, Discharge Exam: Vitals:   06/11/21 2137 06/12/21 0615  BP: (!) 135/58 (!) 162/66  Pulse: 62 (!) 56  Resp:  16  Temp: 98.7 F (37.1 C) 98 F (36.7 C)  SpO2: 98% 99%   Vitals:   06/11/21 0446 06/11/21 1242 06/11/21 2137 06/12/21 0615  BP: 138/63 134/60 (!) 135/58 (!) 162/66  Pulse: 69 60 62 (!) 56  Resp: 14 16  16   Temp: 98.6 F (37 C) 98.9 F (37.2 C) 98.7 F (37.1 C) 98 F (36.7 C)  TempSrc: Oral Oral Oral   SpO2: 94% 98% 98% 99%  Weight:      Height:        General: Pt is alert, awake, not in acute distress, frail and deconditioned, she remains with residual left-sided weakness after her stroke, baseline right-sided lower ext  weakness due to limited movement from her chronic hip fracture. Cardiovascular: RRR, S1/S2 +, no rubs, no gallops Respiratory: CTA bilaterally, no wheezing, no rhonchi Abdominal: Soft, NT, ND, bowel sounds + Extremities: no edema, no cyanosis    The results of significant diagnostics from this hospitalization (including imaging, microbiology, ancillary and laboratory) are listed below for reference.     Microbiology: Recent Results (from the past 240 hour(s))  Resp Panel by RT-PCR (Flu A&B, Covid) Nasopharyngeal Swab     Status: None   Collection Time: 06/07/21  7:30 PM   Specimen: Nasopharyngeal Swab; Nasopharyngeal(NP) swabs in vial transport medium  Result Value Ref Range Status   SARS Coronavirus 2 by RT PCR NEGATIVE NEGATIVE Final    Comment: (NOTE) SARS-CoV-2 target nucleic acids are NOT DETECTED.  The SARS-CoV-2 RNA is generally detectable in upper respiratory specimens during the acute phase of infection. The lowest concentration of SARS-CoV-2 viral copies this assay can detect is 138 copies/mL. A negative result does not preclude SARS-Cov-2 infection and should not be used as the sole basis for treatment or other patient management decisions. A negative result may occur with  improper specimen collection/handling, submission of specimen other than nasopharyngeal swab, presence of viral mutation(s) within the areas targeted by this assay, and inadequate number of viral copies(<138 copies/mL). A negative result must be combined with clinical observations, patient history, and epidemiological information. The expected result is  Negative.  Fact Sheet for Patients:  EntrepreneurPulse.com.au  Fact Sheet for Healthcare Providers:  IncredibleEmployment.be  This test is no t yet approved or cleared by the [place] FDA and  has been authorized for detection and/or diagnosis of SARS-CoV-2 by FDA under an Emergency Use Authorization  (EUA). This EUA will remain  in effect (meaning this test can be used) for the duration of the COVID-19 declaration under Section 564(b)(1) of the Act, 21 U.S.C.section 360bbb-3(b)(1), unless the authorization is terminated  or revoked sooner.       Influenza A by PCR NEGATIVE NEGATIVE Final   Influenza B by PCR NEGATIVE NEGATIVE Final    Comment: (NOTE) The Xpert Xpress SARS-CoV-2/FLU/RSV plus assay is intended as an aid in the diagnosis of influenza from Nasopharyngeal swab specimens and should not be used as a sole basis for treatment. Nasal washings and aspirates are unacceptable for Xpert Xpress SARS-CoV-2/FLU/RSV testing.  Fact Sheet for Patients: EntrepreneurPulse.com.au  Fact Sheet for Healthcare Providers: IncredibleEmployment.be  This test is not yet approved or cleared by the [place] FDA and has been authorized for detection and/or diagnosis of SARS-CoV-2 by FDA under an Emergency Use Authorization (EUA). This EUA will remain in effect (meaning this test can be used) for the duration of the COVID-19 declaration under Section 564(b)(1) of the Act, 21 U.S.C. section 360bbb-3(b)(1), unless the authorization is terminated or revoked.  Performed at [facility], [place] [address] [place], [place] [address]      Labs: BNP (last 3 results) No results for input(s): BNP in the last 8760 hours. Basic Metabolic Panel: Recent Labs  Lab 06/07/21 1141 06/07/21 1211 06/09/21 0302 06/10/21 0354 06/11/21 0401  NA 133* 134* 134* 137 136  K 4.3 4.2 3.6 3.8 3.7  CL 99 99 101 104 105  CO2 22  --  23 25 24   GLUCOSE 99 104* 86 102* 121*  BUN 15 19 14 18 12   CREATININE 0.68 0.60 0.69 0.65 0.56  CALCIUM 9.2  --  8.5* 8.4* 8.3*  PHOS  --   --   --  1.9* 2.1*   Liver Function Tests: Recent Labs  Lab 06/07/21 1141  AST 35  ALT 19  ALKPHOS 67  BILITOT 0.7  PROT 6.2*  ALBUMIN 3.9   No results for input(s): LIPASE, AMYLASE  in the last 168 hours. No results for input(s): AMMONIA in the last 168 hours. CBC: Recent Labs  Lab 06/07/21 1141 06/07/21 1211 06/09/21 0302 06/10/21 0354 06/11/21 0401  WBC 6.3  --  7.0 8.2 10.4  NEUTROABS 2.9  --   --   --   --   HGB 14.3 14.3 12.5 12.1 13.2  HCT 41.9 42.0 36.2 35.0* 37.2  MCV 93.1  --  91.9 91.1 91.0  PLT 201  --  180 179 197   Cardiac Enzymes: No results for input(s): CKTOTAL, CKMB, CKMBINDEX, TROPONINI in the last 168 hours. BNP: Invalid input(s): POCBNP CBG: Recent Labs  Lab 06/07/21 1129  GLUCAP 99   D-Dimer No results for input(s): DDIMER in the last 72 hours. Hgb A1c No results for input(s): HGBA1C in the last 72 hours. Lipid Profile No results for input(s): CHOL, HDL, LDLCALC, TRIG, CHOLHDL, LDLDIRECT in the last 72 hours. Thyroid function studies No results for input(s): TSH, T4TOTAL, T3FREE, THYROIDAB in the last 72 hours.  Invalid input(s): FREET3 Anemia work up No results for input(s): VITAMINB12, FOLATE, FERRITIN, TIBC, IRON, RETICCTPCT in the last 72 hours. Urinalysis  Component Value Date/Time   COLORURINE STRAW (A) 03/23/2020 0201   APPEARANCEUR CLEAR 03/23/2020 0201   LABSPEC 1.006 03/23/2020 0201   PHURINE 7.0 03/23/2020 0201   GLUCOSEU NEGATIVE 03/23/2020 0201   HGBUR NEGATIVE 03/23/2020 0201   BILIRUBINUR NEGATIVE 03/23/2020 0201   KETONESUR 5 (A) 03/23/2020 0201   PROTEINUR NEGATIVE 03/23/2020 0201   NITRITE NEGATIVE 03/23/2020 0201   LEUKOCYTESUR NEGATIVE 03/23/2020 0201   Sepsis Labs Invalid input(s): PROCALCITONIN,  WBC,  LACTICIDVEN Microbiology Recent Results (from the past 240 hour(s))  Resp Panel by RT-PCR (Flu A&B, Covid) Nasopharyngeal Swab     Status: None   Collection Time: 06/07/21  7:30 PM   Specimen: Nasopharyngeal Swab; Nasopharyngeal(NP) swabs in vial transport medium  Result Value Ref Range Status   SARS Coronavirus 2 by RT PCR NEGATIVE NEGATIVE Final    Comment: (NOTE) SARS-CoV-2 target  nucleic acids are NOT DETECTED.  The SARS-CoV-2 RNA is generally detectable in upper respiratory specimens during the acute phase of infection. The lowest concentration of SARS-CoV-2 viral copies this assay can detect is 138 copies/mL. A negative result does not preclude SARS-Cov-2 infection and should not be used as the sole basis for treatment or other patient management decisions. A negative result may occur with  improper specimen collection/handling, submission of specimen other than nasopharyngeal swab, presence of viral mutation(s) within the areas targeted by this assay, and inadequate number of viral copies(<138 copies/mL). A negative result must be combined with clinical observations, patient history, and epidemiological information. The expected result is Negative.  Fact Sheet for Patients:  EntrepreneurPulse.com.au  Fact Sheet for Healthcare Providers:  IncredibleEmployment.be  This test is no t yet approved or cleared by the [place] FDA and  has been authorized for detection and/or diagnosis of SARS-CoV-2 by FDA under an Emergency Use Authorization (EUA). This EUA will remain  in effect (meaning this test can be used) for the duration of the COVID-19 declaration under Section 564(b)(1) of the Act, 21 U.S.C.section 360bbb-3(b)(1), unless the authorization is terminated  or revoked sooner.       Influenza A by PCR NEGATIVE NEGATIVE Final   Influenza B by PCR NEGATIVE NEGATIVE Final    Comment: (NOTE) The Xpert Xpress SARS-CoV-2/FLU/RSV plus assay is intended as an aid in the diagnosis of influenza from Nasopharyngeal swab specimens and should not be used as a sole basis for treatment. Nasal washings and aspirates are unacceptable for Xpert Xpress SARS-CoV-2/FLU/RSV testing.  Fact Sheet for Patients: EntrepreneurPulse.com.au  Fact Sheet for Healthcare  Providers: IncredibleEmployment.be  This test is not yet approved or cleared by the [place] FDA and has been authorized for detection and/or diagnosis of SARS-CoV-2 by FDA under an Emergency Use Authorization (EUA). This EUA will remain in effect (meaning this test can be used) for the duration of the COVID-19 declaration under Section 564(b)(1) of the Act, 21 U.S.C. section 360bbb-3(b)(1), unless the authorization is terminated or revoked.  Performed at [facility], [place] [address], [place], [place] [address]      Time coordinating discharge: Over 30 minutes  SIGNED:   Phillips Climes, MD  [facility] 06/12/2021, 11:41 AM Pager   If 7PM-7AM, please contact night-coverage [email or web address] Password [email or web address]

## 2021-06-12 NOTE — Discharge Instructions (Signed)
Follow with Primary MD [name], MD in 7 days   Get CBC, CMP,  checked  by Primary MD next visit.    Activity: As tolerated with Full fall precautions use walker/cane & assistance as needed   Disposition Home    Diet: Dysphagia 2 with nectar thick, with feeding assistance and aspiration precautions.   On your next visit with your primary care physician please Get Medicines reviewed and adjusted.   Please request your Prim.MD to go over all Hospital Tests and Procedure/Radiological results at the follow up, please get all Hospital records sent to your Prim MD by signing hospital release before you go home.   If you experience worsening of your admission symptoms, develop shortness of breath, life threatening emergency, suicidal or homicidal thoughts you must seek medical attention immediately by calling 911 or calling your MD immediately  if symptoms less severe.  You Must read complete instructions/literature along with all the possible adverse reactions/side effects for all the Medicines you take and that have been prescribed to you. Take any new Medicines after you have completely understood and accpet all the possible adverse reactions/side effects.   Do not drive when taking Pain medications.    Do not take more than prescribed Pain, Sleep and Anxiety Medications  Special Instructions: If you have smoked or chewed Tobacco  in the last 2 yrs please stop smoking, stop any regular Alcohol  and or any Recreational drug use.  Wear Seat belts while driving.   Please note  You were cared for by a hospitalist during your hospital stay. If you have any questions about your discharge medications or the care you received while you were in the hospital after you are discharged, you can call the unit and asked to speak with the hospitalist on call if the hospitalist that took care of you is not available. Once you are discharged, your primary care physician will handle any further  medical issues. Please note that NO REFILLS for any discharge medications will be authorized once you are discharged, as it is imperative that you return to your primary care physician (or establish a relationship with a primary care physician if you do not have one) for your aftercare needs so that they can reassess your need for medications and monitor your lab values.

## 2021-06-12 NOTE — Progress Notes (Signed)
Occupational Therapy Treatment Patient Details Name: [name] MRN: [id number] DOB: 1932-06-24 Today's Date: 06/12/2021    History of present illness 87 yo female [facility] with L weakness, dysphagia; found to have R corona radiata CVA. PMH includes R hip fx 2021 secondary to fall nonop, multiple autoimmune disorders (late onset primary progressive multiple sclerosis, prior diagnosis of seronegative rheumatoid arthritis now felt to be osteoarthritis, temporal arteritis (2010, biopsy-proven, treated with steroids without recurrence)), hypothyroidism, hypertension, hyperlipidemia, coronary artery disease, congestive heart failure (EF 12/2018 60 to 65% with grade 2 diastolic dysfunction), venous insufficiency with chronic edema, osteoporosis.   OT comments  Patient met lying in bed in agreement with OT treatment session. Patient continues to be limited by L hemiplegia, decreased sitting balance, and need for Max to +2 assist grossly for bed mobility and functional transfers. Patient reports ability to flex/extend digits of L hand with increased time and wiggle toes of L foot which she states she was unable to do yesterday. Continues to require hand over hand assist for grooming tasks requiring bimanual manipulation including putting on lipstick. Per med chart patient/family declining SNF rehab with desire for return home with 24hr supervision/assist from family and increased assist from [facility] aide. No family present at bedside for education this date. OT will continue to follow acutely.    Follow Up Recommendations  Home health OT;Supervision/Assistance - 24 hour    Equipment Recommendations  Hospital bed;Other (comment) Product manager lift)    Recommendations for Other Services      Precautions / Restrictions Precautions Precautions: Fall;Other (comment) Precaution Comments: L hemiplegia, bilateral knee contractures Restrictions Weight Bearing Restrictions: No       Mobility  Bed Mobility Overal bed mobility: Needs Assistance Bed Mobility: Rolling;Sidelying to Sit Rolling: Mod assist;Max assist Sidelying to sit: Max assist       General bed mobility comments: Max A for rolling to R. Less assist required to roll to L. Max A at BLE and trunk to come to sitting at EOB. Patient reporting increased spasms in RLE this date.    Transfers Overall transfer level: Needs assistance Equipment used: None Transfers: Squat Pivot Transfers     Squat pivot transfers: Max assist     General transfer comment: Max A for squat-pivot to R via face to face transfer with cues for hand placement.    Balance Overall balance assessment: Needs assistance Sitting-balance support: Feet supported;Single extremity supported Sitting balance-Leahy Scale: Poor Sitting balance - Comments: reliant on single UE support, able to achieve midline, intermittent L lateral vs anterior lean with ability to self-correct with cues and Min A. Requiring min guard assist at best and Min A at most to maintain static sitting balance.                                   ADL either performed or assessed with clinical judgement   ADL Overall ADL's : Needs assistance/impaired     Grooming: Maximal assistance Grooming Details (indicate cue type and reason): Max A to apply lipstick with hand over hand assist at LUE.                 Toilet Transfer: Maximal assistance;+2 for physical assistance Toilet Transfer Details (indicate cue type and reason): Simulated with squat-pivot to recliner via face to face transfer with use of chuck pad.           General  ADL Comments: Patient limited by LUE hemiplegia, BLE contractures, and decreased sitting balance.     Vision       Perception     Praxis      Cognition Arousal/Alertness: Awake/alert Behavior During Therapy: WFL for tasks assessed/performed Overall Cognitive Status: Impaired/Different from baseline Area of Impairment:  Problem solving                             Problem Solving: Slow processing;Difficulty sequencing;Requires verbal cues General Comments: Follows 1-step verbal commands with increased time; decreased attention to task        Exercises     Shoulder Instructions       General Comments VSS on RA.    Pertinent Vitals/ Pain       Pain Assessment: No/denies pain Pain Intervention(s): Monitored during session  Home Living                                          Prior Functioning/Environment              Frequency  Min 2X/week        Progress Toward Goals  OT Goals(current goals can now be found in the care plan section)  Progress towards OT goals: Progressing toward goals  Acute Rehab OT Goals Patient Stated Goal: To return home. OT Goal Formulation: With patient Time For Goal Achievement: 06/22/21 Potential to Achieve Goals: Good ADL Goals Pt Will Perform Grooming: with supervision;with set-up;sitting Pt Will Transfer to Toilet: with set-up;with supervision;squat pivot transfer Pt Will Perform Toileting - Clothing Manipulation and hygiene: with supervision;with set-up;sitting/lateral leans  Plan Discharge plan needs to be updated;Frequency remains appropriate    Co-evaluation                 AM-PAC OT "6 Clicks" Daily Activity     Outcome Measure   Help from another person eating meals?: A Lot Help from another person taking care of personal grooming?: A Lot Help from another person toileting, which includes using toliet, bedpan, or urinal?: Total Help from another person bathing (including washing, rinsing, drying)?: Total Help from another person to put on and taking off regular upper body clothing?: Total Help from another person to put on and taking off regular lower body clothing?: Total 6 Click Score: 8    End of Session Equipment Utilized During Treatment: Gait belt  OT Visit Diagnosis: History of falling  (Z91.81);Unsteadiness on feet (R26.81);Other symptoms and signs involving the nervous system (R29.898);Hemiplegia and hemiparesis Hemiplegia - Right/Left: Left Hemiplegia - dominant/non-dominant: Non-Dominant Hemiplegia - caused by: Cerebral infarction   Activity Tolerance Patient tolerated treatment well   Patient Left in chair;with call bell/phone within reach;with chair alarm set   Nurse Communication Mobility status;Other (comment) (Response to treatment)        Time: 7322-0254 OT Time Calculation (min): 33 min  Charges: OT General Charges $OT Visit: 1 Visit OT Treatments $Therapeutic Activity: 8-22 mins $Neuromuscular Re-education: 8-22 mins   H. OTR/L Supplemental OT, Department of rehab services [phone]    R H. 06/12/2021, 11:29 AM

## 2021-06-14 DIAGNOSIS — I872 Venous insufficiency (chronic) (peripheral): Secondary | ICD-10-CM | POA: Diagnosis not present

## 2021-06-14 DIAGNOSIS — I2510 Atherosclerotic heart disease of native coronary artery without angina pectoris: Secondary | ICD-10-CM | POA: Diagnosis not present

## 2021-06-14 DIAGNOSIS — M87251 Osteonecrosis due to previous trauma, right femur: Secondary | ICD-10-CM | POA: Diagnosis not present

## 2021-06-14 DIAGNOSIS — E039 Hypothyroidism, unspecified: Secondary | ICD-10-CM | POA: Diagnosis not present

## 2021-06-14 DIAGNOSIS — M316 Other giant cell arteritis: Secondary | ICD-10-CM | POA: Diagnosis not present

## 2021-06-14 DIAGNOSIS — M0600 Rheumatoid arthritis without rheumatoid factor, unspecified site: Secondary | ICD-10-CM | POA: Diagnosis not present

## 2021-06-14 DIAGNOSIS — R32 Unspecified urinary incontinence: Secondary | ICD-10-CM | POA: Diagnosis not present

## 2021-06-14 DIAGNOSIS — M8580 Other specified disorders of bone density and structure, unspecified site: Secondary | ICD-10-CM | POA: Diagnosis not present

## 2021-06-14 DIAGNOSIS — Z7902 Long term (current) use of antithrombotics/antiplatelets: Secondary | ICD-10-CM | POA: Diagnosis not present

## 2021-06-14 DIAGNOSIS — M21372 Foot drop, left foot: Secondary | ICD-10-CM | POA: Diagnosis not present

## 2021-06-14 DIAGNOSIS — Z9181 History of falling: Secondary | ICD-10-CM | POA: Diagnosis not present

## 2021-06-14 DIAGNOSIS — Z90710 Acquired absence of both cervix and uterus: Secondary | ICD-10-CM | POA: Diagnosis not present

## 2021-06-14 DIAGNOSIS — I69354 Hemiplegia and hemiparesis following cerebral infarction affecting left non-dominant side: Secondary | ICD-10-CM | POA: Diagnosis not present

## 2021-06-14 DIAGNOSIS — I69392 Facial weakness following cerebral infarction: Secondary | ICD-10-CM | POA: Diagnosis not present

## 2021-06-14 DIAGNOSIS — E559 Vitamin D deficiency, unspecified: Secondary | ICD-10-CM | POA: Diagnosis not present

## 2021-06-14 DIAGNOSIS — I5032 Chronic diastolic (congestive) heart failure: Secondary | ICD-10-CM | POA: Diagnosis not present

## 2021-06-14 DIAGNOSIS — R1312 Dysphagia, oropharyngeal phase: Secondary | ICD-10-CM | POA: Diagnosis not present

## 2021-06-14 DIAGNOSIS — I69328 Other speech and language deficits following cerebral infarction: Secondary | ICD-10-CM | POA: Diagnosis not present

## 2021-06-14 DIAGNOSIS — I110 Hypertensive heart disease with heart failure: Secondary | ICD-10-CM | POA: Diagnosis not present

## 2021-06-14 DIAGNOSIS — Z8781 Personal history of (healed) traumatic fracture: Secondary | ICD-10-CM | POA: Diagnosis not present

## 2021-06-14 DIAGNOSIS — G35 Multiple sclerosis: Secondary | ICD-10-CM | POA: Diagnosis not present

## 2021-06-14 DIAGNOSIS — M1990 Unspecified osteoarthritis, unspecified site: Secondary | ICD-10-CM | POA: Diagnosis not present

## 2021-06-14 DIAGNOSIS — I69391 Dysphagia following cerebral infarction: Secondary | ICD-10-CM | POA: Diagnosis not present

## 2021-06-14 DIAGNOSIS — E7800 Pure hypercholesterolemia, unspecified: Secondary | ICD-10-CM | POA: Diagnosis not present

## 2021-06-14 DIAGNOSIS — M80051S Age-related osteoporosis with current pathological fracture, right femur, sequela: Secondary | ICD-10-CM | POA: Diagnosis not present

## 2021-06-15 DIAGNOSIS — G35 Multiple sclerosis: Secondary | ICD-10-CM | POA: Diagnosis not present

## 2021-06-15 DIAGNOSIS — I69354 Hemiplegia and hemiparesis following cerebral infarction affecting left non-dominant side: Secondary | ICD-10-CM | POA: Diagnosis not present

## 2021-06-15 DIAGNOSIS — I69391 Dysphagia following cerebral infarction: Secondary | ICD-10-CM | POA: Diagnosis not present

## 2021-06-15 DIAGNOSIS — I69392 Facial weakness following cerebral infarction: Secondary | ICD-10-CM | POA: Diagnosis not present

## 2021-06-15 DIAGNOSIS — R1312 Dysphagia, oropharyngeal phase: Secondary | ICD-10-CM | POA: Diagnosis not present

## 2021-06-15 DIAGNOSIS — I69328 Other speech and language deficits following cerebral infarction: Secondary | ICD-10-CM | POA: Diagnosis not present

## 2021-06-18 ENCOUNTER — Other Ambulatory Visit: Payer: Self-pay | Admitting: *Deleted

## 2021-06-18 DIAGNOSIS — R1312 Dysphagia, oropharyngeal phase: Secondary | ICD-10-CM | POA: Diagnosis not present

## 2021-06-18 DIAGNOSIS — I69354 Hemiplegia and hemiparesis following cerebral infarction affecting left non-dominant side: Secondary | ICD-10-CM | POA: Diagnosis not present

## 2021-06-18 DIAGNOSIS — I69328 Other speech and language deficits following cerebral infarction: Secondary | ICD-10-CM | POA: Diagnosis not present

## 2021-06-18 DIAGNOSIS — G35 Multiple sclerosis: Secondary | ICD-10-CM | POA: Diagnosis not present

## 2021-06-18 DIAGNOSIS — I69391 Dysphagia following cerebral infarction: Secondary | ICD-10-CM | POA: Diagnosis not present

## 2021-06-18 DIAGNOSIS — I69392 Facial weakness following cerebral infarction: Secondary | ICD-10-CM | POA: Diagnosis not present

## 2021-06-18 NOTE — Patient Outreach (Signed)
[facility]) Care Management  [place]  06/18/2021   [name] [birth year] [id number]   RED ON EMMI ALERT - Stroke Day # 1 Date: 7/8 Red Alert Reason: Not have follow up appointment scheduled   Outreach attempt #1, successful to member and son.  Identity verified.  This care manager introduced self and stated purpose of call.  [facility] care management services explained.    Social: Member lives alone, noted SNF was recommended post discharge, member and family declined.  Son report someone always with member, has caregivers 24 hours/day.    Conditions: Per chart, has history of CHF, HTN, Stroke, MS, Hypothyroidism, and HLD.   Encounter Medications:  Outpatient Encounter Medications as of 06/18/2021  Medication Sig Note   acetaminophen (TYLENOL) 325 MG tablet Take 2 tablets (650 mg total) by mouth every 6 (six) hours as needed for mild pain (or temp > 37.5 C (99.5 F)).    aspirin EC 81 MG EC tablet Take 1 tablet (81 mg total) by mouth daily. Swallow whole.    atorvastatin (LIPITOR) 80 MG tablet Take 1 tablet (80 mg total) by mouth daily.    baclofen (LIORESAL) 10 MG tablet Take 10 mg by mouth 3 (three) times daily.    Biotin 10000 MCG TABS Take 1,000 mcg by mouth daily.    Calcium Carb-Cholecalciferol (CALCIUM-VITAMIN D3) 250-125 MG-UNIT TABS Take 1 tablet by mouth 2 (two) times daily.    Calcium Carbonate-Vitamin D 600-200 MG-UNIT CAPS Take 1 capsule by mouth daily.    Carboxymethylcellul-Glycerin (LUBRICATING EYE DROPS OP) Place 1 drop into both eyes 2 (two) times daily. For Eye Dryness    clopidogrel (PLAVIX) 75 MG tablet Take 1 tablet (75 mg total) by mouth daily for 18 days.    diclofenac Sodium (VOLTAREN) 1 % GEL APPLY 2 GMS TOPICALLY TO AFFECTED AREAS (RIGHT HIP) TWICE DAILY AS NEEDED FOR PAIN    food thickener (SIMPLYTHICK, NECTAR/LEVEL 2/MILDLY THICK,) GEL Please use with all fluids for nectar thick consistency    levothyroxine (SYNTHROID)  25 MCG tablet Take 1 tablet (25 mcg total) by mouth daily.    Magnesium 250 MG TABS Take 1 tablet by mouth daily.    Multiple Vitamins-Minerals (ICAPS AREDS FORMULA PO) Take 1 capsule by mouth daily.     nabumetone (RELAFEN) 500 MG tablet Take 1 tablet (500 mg total) by mouth 2 (two) times daily as needed.    pantoprazole (PROTONIX) 40 MG tablet Take 1 tablet (40 mg total) by mouth daily.    Vitamin D, Ergocalciferol, (DRISDOL) 1.25 MG (50000 UNIT) CAPS capsule Take 50,000 Units by mouth every 7 (seven) days. 06/07/2021: Take on Tuesdays    vitamin E 45 MG (100 UNITS) capsule Take 100 Units by mouth daily.    [DISCONTINUED] Abatacept (ORENCIA) 125 MG/ML SOSY 1 ml    [DISCONTINUED] Azelastine HCl 137 MCG/SPRAY SOLN Place 1 spray into both nostrils 2 (two) times daily as needed (As needed for allergies).    [DISCONTINUED] leflunomide (ARAVA) 10 MG tablet     No facility-administered encounter medications on file as of 06/18/2021.    Functional Status:  In your present state of health, do you have any difficulty performing the following activities: 06/08/2021  Hearing? N  Vision? N  Difficulty concentrating or making decisions? N  Walking or climbing stairs? Y  Dressing or bathing? N  Doing errands, shopping? N  Some recent data might be hidden    Fall/Depression Screening: Fall Risk  04/23/2020  Risk for fall due to : History of fall(s);Impaired balance/gait;Impaired mobility;Medication side effect;Orthopedic patient  Follow up Falls evaluation completed;Education provided;Falls prevention discussed  Comment at [facility]   No flowsheet data found.  Assessment:   Care Plan Care Plan : Stroke (Adult)  Updates made by Valente David, RN since 06/18/2021 12:00 AM     Problem: Long-Term Care Planning (Stroke)      Long-Range Goal: Effective Long-Term Care Planning   Start Date: 06/18/2021  Expected End Date: 10/16/2021  Note:   Evidence-based guidance:  Explain to patient and  family/caregiver that it is vital to plan long-term changes in care needs; assess caregiver ability and desire to provide care.  Correlate the discussion of financial planning, intensification of type or level of care and end of life care planning with patient's changing (or declining) function and cognitive ability.  Identify patient's values, goals and preferences to inform future care decisions.  Refer to financial navigator or Education officer, museum for thorough evaluation and resource assistance.  Facilitate family/caregiver meeting, especially when goal is to delay out-of-home placement.  Discuss Medicare or Medicaid, life and long-term care insurance, employee or retirement benefits, personal assets, veterans' benefits, social security or social security disability insurance as ways to meet costs.   Encourage inclusion of community resources, such as meal delivery, respite care, transportation services, local aging councils, adult day care and support groups.  Encourage investigation of services, such as home health aide, in-home skilled nursing, physical therapy, assisted living, skilled nursing facility or nursing home.  Discuss benefits of additional support referrals that may include spiritual advisor or chaplain, pain or palliative care specialist, hospice care and home healthcare.  Compassionately discuss advance care planning; if patient can make own decisions, encourage completion of advance directives and assist in identification of a proxy decision-maker.   Notes:     Task: Facilitate Planning for Long-Term Care Needs   Note:   Care Management Activities:    - decision-making supported - family involvement promoted    Notes:     Problem: Residual Deficits (Stroke)      Goal: Residual Deficits Prevented or Minimized   Start Date: 06/18/2021  Expected End Date: 08/17/2021  Note:   Evidence-based guidance:  Evaluate changes in function that determine patient's rehabilitation plan,  including pain, vision, basic and instrumental activities of daily living, motor control, upper and lower motor function, cognition and emotions.  Prepare patient for long-term (at least 6 months, 5 days per week) interprofessional rehabilitation based on tolerance and degree of functional limitation, beginning soon after stroke event.  Refer to rehabilitation therapy for assessment and individualized program that may include swallowing, functional task training, exercise, assistive device training, enhancement of self-care ability and cardiorespiratory fitness.  Prepare patient for referral to speech language pathologist to assess and treat speech/language and swallowing deficits.  Note: Patient with persistent weight loss or recurrent infections should be urgently assessed and treated.  Promote optimal independence and self-efficacy.  Monitor and manage effects of pharmacologic therapy that may include serotonin selective reuptake inhibitor, anticholinergic, vitamin supplement, eugeroic (wakefulness-promoting agent) or bisphosphonate.  Screen and assess risk for malnutrition, micronutrient deficiency and dehydration; address tolerance to diet and assess adequacy of fluid intake.  If not able to meet nutrition or fluid requirements, consider recommendations of alternate route for nutrition, hydration and medication while considering quality of life and patient preferences.  Assess and address poststroke fatigue; consider regular exercise, sleep-hygiene practices, avoidance of sedating drugs and excessive alcohol, mindfulness, relaxation  and pharmacologic therapy.   Promote regular exercise and physical activity focused on improving strength and cardio-respiratory function based on ability and tolerance.  Encourage appropriate vocational or educational counseling for reentering the community, workplace or school, including a driving evaluation.   Notes:     Task: Optimize Functional Ability   Note:    Care Management Activities:    - assistive or adaptive device use encouraged - knowledge of how to summon emergency services ensured    Notes:       Goals Addressed             This Visit's Progress    THN - Keep or Improve My Strength-Stroke       Timeframe:  Long-Range Goal Priority:  High Start Date:      7/11                       Expected End Date:     12/11                  Follow Up Date 07/02/2021    - arrange in-home help services - eat healthy to increase strength - know who to call for help if I fall    Why is this important?   Before the stroke you probably did not think much about being safe when you are up and about.  Now, it may be harder for you to get around.  It may also be easier for you to trip or fall.  It is common to have muscle weakness after a stroke. You may also feel like you cannot control an arm or leg.  It will be helpful to work with a physical therapist to get your strength and muscle control back.  It is good to stay as active as you can. Walking and stretching help you stay strong and flexible.  The physical therapist will develop an exercise program just for you.     Notes:   7/11 - Per son, has home health PT, OT, and SLP.  Also has caregivers 24/7 in the home.      THN - Make and Keep All Appointments       Timeframe:  Short-Term Goal Priority:  Medium Start Date:      7/11                       Expected End Date:    9/11  Barriers: Transportation - depends on aide and/or virtual visits                    Follow Up Date 07/02/2021    - call to cancel if needed - keep a calendar with appointment dates    Why is this important?   Part of staying healthy is seeing the doctor for follow-up care.  If you forget your appointments, there are some things you can do to stay on track.    Notes:   7/11 - has virtual appointment with PCP in the next couple days, will have follow up with neurology on 8/11         Plan:   Follow-up: Patient agrees to Care Plan and Follow-up. Follow-up in 2 week(s).   Valente David, [place], MSN [place] [phone]

## 2021-06-20 DIAGNOSIS — G35 Multiple sclerosis: Secondary | ICD-10-CM | POA: Diagnosis not present

## 2021-06-20 DIAGNOSIS — R1312 Dysphagia, oropharyngeal phase: Secondary | ICD-10-CM | POA: Diagnosis not present

## 2021-06-20 DIAGNOSIS — I69354 Hemiplegia and hemiparesis following cerebral infarction affecting left non-dominant side: Secondary | ICD-10-CM | POA: Diagnosis not present

## 2021-06-20 DIAGNOSIS — I69391 Dysphagia following cerebral infarction: Secondary | ICD-10-CM | POA: Diagnosis not present

## 2021-06-20 DIAGNOSIS — I69328 Other speech and language deficits following cerebral infarction: Secondary | ICD-10-CM | POA: Diagnosis not present

## 2021-06-20 DIAGNOSIS — I69392 Facial weakness following cerebral infarction: Secondary | ICD-10-CM | POA: Diagnosis not present

## 2021-06-22 DIAGNOSIS — I69392 Facial weakness following cerebral infarction: Secondary | ICD-10-CM | POA: Diagnosis not present

## 2021-06-22 DIAGNOSIS — I69391 Dysphagia following cerebral infarction: Secondary | ICD-10-CM | POA: Diagnosis not present

## 2021-06-22 DIAGNOSIS — R1312 Dysphagia, oropharyngeal phase: Secondary | ICD-10-CM | POA: Diagnosis not present

## 2021-06-22 DIAGNOSIS — G35 Multiple sclerosis: Secondary | ICD-10-CM | POA: Diagnosis not present

## 2021-06-22 DIAGNOSIS — I69328 Other speech and language deficits following cerebral infarction: Secondary | ICD-10-CM | POA: Diagnosis not present

## 2021-06-22 DIAGNOSIS — I69354 Hemiplegia and hemiparesis following cerebral infarction affecting left non-dominant side: Secondary | ICD-10-CM | POA: Diagnosis not present

## 2021-06-25 ENCOUNTER — Other Ambulatory Visit: Payer: Self-pay | Admitting: *Deleted

## 2021-06-25 DIAGNOSIS — I69392 Facial weakness following cerebral infarction: Secondary | ICD-10-CM | POA: Diagnosis not present

## 2021-06-25 DIAGNOSIS — I69391 Dysphagia following cerebral infarction: Secondary | ICD-10-CM | POA: Diagnosis not present

## 2021-06-25 DIAGNOSIS — I69328 Other speech and language deficits following cerebral infarction: Secondary | ICD-10-CM | POA: Diagnosis not present

## 2021-06-25 DIAGNOSIS — G35 Multiple sclerosis: Secondary | ICD-10-CM | POA: Diagnosis not present

## 2021-06-25 DIAGNOSIS — R1312 Dysphagia, oropharyngeal phase: Secondary | ICD-10-CM | POA: Diagnosis not present

## 2021-06-25 DIAGNOSIS — I69354 Hemiplegia and hemiparesis following cerebral infarction affecting left non-dominant side: Secondary | ICD-10-CM | POA: Diagnosis not present

## 2021-06-25 NOTE — Patient Outreach (Signed)
[place] [facility]) Care Management  06/25/2021  [name] [birth year] [id number]   RED ON EMMI ALERT - Stroke Day # 9 Date: 7/16 Red Alert Reason: Feeling sad, hopeless, empty, anxious   Outreach attempt #1, unsuccessful, HIPAA compliant voice message left.   Plan: RN CM will follow up within the next 3-5 business days.  Valente David, [place], MSN [place] [phone]

## 2021-06-26 ENCOUNTER — Other Ambulatory Visit: Payer: Self-pay | Admitting: *Deleted

## 2021-06-26 ENCOUNTER — Encounter: Payer: Self-pay | Admitting: *Deleted

## 2021-06-26 DIAGNOSIS — I69392 Facial weakness following cerebral infarction: Secondary | ICD-10-CM | POA: Diagnosis not present

## 2021-06-26 DIAGNOSIS — R1312 Dysphagia, oropharyngeal phase: Secondary | ICD-10-CM | POA: Diagnosis not present

## 2021-06-26 DIAGNOSIS — I69391 Dysphagia following cerebral infarction: Secondary | ICD-10-CM | POA: Diagnosis not present

## 2021-06-26 DIAGNOSIS — G35 Multiple sclerosis: Secondary | ICD-10-CM | POA: Diagnosis not present

## 2021-06-26 DIAGNOSIS — I69328 Other speech and language deficits following cerebral infarction: Secondary | ICD-10-CM | POA: Diagnosis not present

## 2021-06-26 DIAGNOSIS — I69354 Hemiplegia and hemiparesis following cerebral infarction affecting left non-dominant side: Secondary | ICD-10-CM | POA: Diagnosis not present

## 2021-06-26 NOTE — Patient Outreach (Signed)
[facility]) Care Management  06/26/2021  Billye Pickerel [place] December 13, 1931 [id number]     RED ON EMMI ALERT - Stroke Day # 9 Date: 7/16 Red Alert Reason: Feeling sad, hopeless, empty, anxious   Incoming call received after missed call yesterday.  Member admits that she has been having intermittent feelings of being sad, stating "you would be sad too if you were going through this and couldn't move your arm."  Report her mood has started to improve as she has started gaining strength back with the help of home health PT.  She is still mostly wheelchair bound with caregivers 24/7.  Son and daughter are active in care, however both live out of state.  State she also has the help of church friends/family.  Denies any urgent concerns, encouraged to contact this care manager with questions.  Agrees to follow up within the next 2 weeks.   Goals Addressed             This Visit's Progress    THN - Keep or Improve My Strength-Stroke   On track    Timeframe:  Long-Range Goal Priority:  High Start Date:      7/11                       Expected End Date:     12/11                  Follow Up Date 07/02/2021    - arrange in-home help services - eat healthy to increase strength - know who to call for help if I fall    Why is this important?   Before the stroke you probably did not think much about being safe when you are up and about.  Now, it may be harder for you to get around.  It may also be easier for you to trip or fall.  It is common to have muscle weakness after a stroke. You may also feel like you cannot control an arm or leg.  It will be helpful to work with a physical therapist to get your strength and muscle control back.  It is good to stay as active as you can. Walking and stretching help you stay strong and flexible.  The physical therapist will develop an exercise program just for you.     Notes:   7/11 - Per son, has home health PT, OT, and SLP.  Also  has caregivers 24/7 in the home.  7/19 - Confirms home health remains involved, feels she is increasing in strength     [facility] - Make and Keep All Appointments   On track    Timeframe:  Short-Term Goal Priority:  Medium Start Date:      7/11                       Expected End Date:    9/11  Barriers: Transportation - depends on aide and/or virtual visits                    Follow Up Date 07/02/2021    - call to cancel if needed - keep a calendar with appointment dates    Why is this important?   Part of staying healthy is seeing the doctor for follow-up care.  If you forget your appointments, there are some things you can do to stay on track.    Notes:  7/11 - has virtual appointment with PCP in the next couple days, will have follow up with neurology on 8/11  7/19 - will have virtual appointment with PCP tomorrow, will reassess to see if she is able to go to office for neurology follow up.  If not, she will request virtual         Valente David, RN, MSN [place] Manager [phone]

## 2021-06-27 DIAGNOSIS — I69392 Facial weakness following cerebral infarction: Secondary | ICD-10-CM | POA: Diagnosis not present

## 2021-06-27 DIAGNOSIS — R262 Difficulty in walking, not elsewhere classified: Secondary | ICD-10-CM | POA: Diagnosis not present

## 2021-06-27 DIAGNOSIS — R54 Age-related physical debility: Secondary | ICD-10-CM | POA: Diagnosis not present

## 2021-06-27 DIAGNOSIS — Z8673 Personal history of transient ischemic attack (TIA), and cerebral infarction without residual deficits: Secondary | ICD-10-CM | POA: Diagnosis not present

## 2021-06-27 DIAGNOSIS — I69391 Dysphagia following cerebral infarction: Secondary | ICD-10-CM | POA: Diagnosis not present

## 2021-06-27 DIAGNOSIS — I69328 Other speech and language deficits following cerebral infarction: Secondary | ICD-10-CM | POA: Diagnosis not present

## 2021-06-27 DIAGNOSIS — I6990 Unspecified sequelae of unspecified cerebrovascular disease: Secondary | ICD-10-CM | POA: Diagnosis not present

## 2021-06-27 DIAGNOSIS — R1312 Dysphagia, oropharyngeal phase: Secondary | ICD-10-CM | POA: Diagnosis not present

## 2021-06-27 DIAGNOSIS — I69354 Hemiplegia and hemiparesis following cerebral infarction affecting left non-dominant side: Secondary | ICD-10-CM | POA: Diagnosis not present

## 2021-06-27 DIAGNOSIS — G35 Multiple sclerosis: Secondary | ICD-10-CM | POA: Diagnosis not present

## 2021-06-28 ENCOUNTER — Other Ambulatory Visit: Payer: Self-pay | Admitting: *Deleted

## 2021-06-28 DIAGNOSIS — I69391 Dysphagia following cerebral infarction: Secondary | ICD-10-CM | POA: Diagnosis not present

## 2021-06-28 DIAGNOSIS — I69354 Hemiplegia and hemiparesis following cerebral infarction affecting left non-dominant side: Secondary | ICD-10-CM | POA: Diagnosis not present

## 2021-06-28 DIAGNOSIS — R1312 Dysphagia, oropharyngeal phase: Secondary | ICD-10-CM | POA: Diagnosis not present

## 2021-06-28 DIAGNOSIS — I69392 Facial weakness following cerebral infarction: Secondary | ICD-10-CM | POA: Diagnosis not present

## 2021-06-28 DIAGNOSIS — G35 Multiple sclerosis: Secondary | ICD-10-CM | POA: Diagnosis not present

## 2021-06-28 DIAGNOSIS — I69328 Other speech and language deficits following cerebral infarction: Secondary | ICD-10-CM | POA: Diagnosis not present

## 2021-06-28 NOTE — Patient Outreach (Signed)
[facility] [facility]) Care Management  06/28/2021  Kaley Jutras [place] 14-Aug-1932 [id number]   RED ON EMMI ALERT - Stroke Day # 13 Date: 7/20 Red Alert Reason: Not been to follow up appointment   Member had not been to follow up appointment as it was scheduled in the future.  She has since had virtual visit with PCP, planning to have neurology follow up on 8/11.   Plan: RN CM will follow up within the next 2 weeks as planned.  Valente David, [place], MSN [place] [phone]

## 2021-07-02 ENCOUNTER — Ambulatory Visit: Payer: Self-pay | Admitting: *Deleted

## 2021-07-02 DIAGNOSIS — I69354 Hemiplegia and hemiparesis following cerebral infarction affecting left non-dominant side: Secondary | ICD-10-CM | POA: Diagnosis not present

## 2021-07-02 DIAGNOSIS — I69328 Other speech and language deficits following cerebral infarction: Secondary | ICD-10-CM | POA: Diagnosis not present

## 2021-07-02 DIAGNOSIS — G35 Multiple sclerosis: Secondary | ICD-10-CM | POA: Diagnosis not present

## 2021-07-02 DIAGNOSIS — I69392 Facial weakness following cerebral infarction: Secondary | ICD-10-CM | POA: Diagnosis not present

## 2021-07-02 DIAGNOSIS — I69391 Dysphagia following cerebral infarction: Secondary | ICD-10-CM | POA: Diagnosis not present

## 2021-07-02 DIAGNOSIS — R1312 Dysphagia, oropharyngeal phase: Secondary | ICD-10-CM | POA: Diagnosis not present

## 2021-07-03 DIAGNOSIS — I69391 Dysphagia following cerebral infarction: Secondary | ICD-10-CM | POA: Diagnosis not present

## 2021-07-03 DIAGNOSIS — I69392 Facial weakness following cerebral infarction: Secondary | ICD-10-CM | POA: Diagnosis not present

## 2021-07-03 DIAGNOSIS — I69328 Other speech and language deficits following cerebral infarction: Secondary | ICD-10-CM | POA: Diagnosis not present

## 2021-07-03 DIAGNOSIS — I69354 Hemiplegia and hemiparesis following cerebral infarction affecting left non-dominant side: Secondary | ICD-10-CM | POA: Diagnosis not present

## 2021-07-03 DIAGNOSIS — G35 Multiple sclerosis: Secondary | ICD-10-CM | POA: Diagnosis not present

## 2021-07-03 DIAGNOSIS — R1312 Dysphagia, oropharyngeal phase: Secondary | ICD-10-CM | POA: Diagnosis not present

## 2021-07-04 DIAGNOSIS — I69392 Facial weakness following cerebral infarction: Secondary | ICD-10-CM | POA: Diagnosis not present

## 2021-07-04 DIAGNOSIS — G35 Multiple sclerosis: Secondary | ICD-10-CM | POA: Diagnosis not present

## 2021-07-04 DIAGNOSIS — I69328 Other speech and language deficits following cerebral infarction: Secondary | ICD-10-CM | POA: Diagnosis not present

## 2021-07-04 DIAGNOSIS — I69354 Hemiplegia and hemiparesis following cerebral infarction affecting left non-dominant side: Secondary | ICD-10-CM | POA: Diagnosis not present

## 2021-07-04 DIAGNOSIS — R1312 Dysphagia, oropharyngeal phase: Secondary | ICD-10-CM | POA: Diagnosis not present

## 2021-07-04 DIAGNOSIS — I69391 Dysphagia following cerebral infarction: Secondary | ICD-10-CM | POA: Diagnosis not present

## 2021-07-05 DIAGNOSIS — I69354 Hemiplegia and hemiparesis following cerebral infarction affecting left non-dominant side: Secondary | ICD-10-CM | POA: Diagnosis not present

## 2021-07-05 DIAGNOSIS — G35 Multiple sclerosis: Secondary | ICD-10-CM | POA: Diagnosis not present

## 2021-07-05 DIAGNOSIS — I69392 Facial weakness following cerebral infarction: Secondary | ICD-10-CM | POA: Diagnosis not present

## 2021-07-05 DIAGNOSIS — R1312 Dysphagia, oropharyngeal phase: Secondary | ICD-10-CM | POA: Diagnosis not present

## 2021-07-05 DIAGNOSIS — I69328 Other speech and language deficits following cerebral infarction: Secondary | ICD-10-CM | POA: Diagnosis not present

## 2021-07-05 DIAGNOSIS — I69391 Dysphagia following cerebral infarction: Secondary | ICD-10-CM | POA: Diagnosis not present

## 2021-07-09 DIAGNOSIS — I69391 Dysphagia following cerebral infarction: Secondary | ICD-10-CM | POA: Diagnosis not present

## 2021-07-09 DIAGNOSIS — R1312 Dysphagia, oropharyngeal phase: Secondary | ICD-10-CM | POA: Diagnosis not present

## 2021-07-09 DIAGNOSIS — I69392 Facial weakness following cerebral infarction: Secondary | ICD-10-CM | POA: Diagnosis not present

## 2021-07-09 DIAGNOSIS — G35 Multiple sclerosis: Secondary | ICD-10-CM | POA: Diagnosis not present

## 2021-07-09 DIAGNOSIS — I69354 Hemiplegia and hemiparesis following cerebral infarction affecting left non-dominant side: Secondary | ICD-10-CM | POA: Diagnosis not present

## 2021-07-09 DIAGNOSIS — I69328 Other speech and language deficits following cerebral infarction: Secondary | ICD-10-CM | POA: Diagnosis not present

## 2021-07-10 DIAGNOSIS — G35 Multiple sclerosis: Secondary | ICD-10-CM | POA: Diagnosis not present

## 2021-07-10 DIAGNOSIS — I69354 Hemiplegia and hemiparesis following cerebral infarction affecting left non-dominant side: Secondary | ICD-10-CM | POA: Diagnosis not present

## 2021-07-10 DIAGNOSIS — I69328 Other speech and language deficits following cerebral infarction: Secondary | ICD-10-CM | POA: Diagnosis not present

## 2021-07-10 DIAGNOSIS — I69392 Facial weakness following cerebral infarction: Secondary | ICD-10-CM | POA: Diagnosis not present

## 2021-07-10 DIAGNOSIS — R1312 Dysphagia, oropharyngeal phase: Secondary | ICD-10-CM | POA: Diagnosis not present

## 2021-07-10 DIAGNOSIS — I69391 Dysphagia following cerebral infarction: Secondary | ICD-10-CM | POA: Diagnosis not present

## 2021-07-11 ENCOUNTER — Other Ambulatory Visit: Payer: Self-pay | Admitting: *Deleted

## 2021-07-11 DIAGNOSIS — I69392 Facial weakness following cerebral infarction: Secondary | ICD-10-CM | POA: Diagnosis not present

## 2021-07-11 DIAGNOSIS — G35 Multiple sclerosis: Secondary | ICD-10-CM | POA: Diagnosis not present

## 2021-07-11 DIAGNOSIS — I69354 Hemiplegia and hemiparesis following cerebral infarction affecting left non-dominant side: Secondary | ICD-10-CM | POA: Diagnosis not present

## 2021-07-11 DIAGNOSIS — I69391 Dysphagia following cerebral infarction: Secondary | ICD-10-CM | POA: Diagnosis not present

## 2021-07-11 DIAGNOSIS — I69328 Other speech and language deficits following cerebral infarction: Secondary | ICD-10-CM | POA: Diagnosis not present

## 2021-07-11 DIAGNOSIS — R1312 Dysphagia, oropharyngeal phase: Secondary | ICD-10-CM | POA: Diagnosis not present

## 2021-07-11 NOTE — Patient Outreach (Signed)
[place] [facility]) Care Management  07/11/2021  Bora Densley [birth year] [id number]   Outgoing call placed to member, state PT has just arrived in the home.  State she will call this care manager back.  If no call back, will follow up within the next 3-4 business days.  Valente David, [place], MSN [place] [phone]

## 2021-07-12 DIAGNOSIS — I69354 Hemiplegia and hemiparesis following cerebral infarction affecting left non-dominant side: Secondary | ICD-10-CM | POA: Diagnosis not present

## 2021-07-12 DIAGNOSIS — G35 Multiple sclerosis: Secondary | ICD-10-CM | POA: Diagnosis not present

## 2021-07-12 DIAGNOSIS — R1312 Dysphagia, oropharyngeal phase: Secondary | ICD-10-CM | POA: Diagnosis not present

## 2021-07-12 DIAGNOSIS — I69392 Facial weakness following cerebral infarction: Secondary | ICD-10-CM | POA: Diagnosis not present

## 2021-07-12 DIAGNOSIS — I69328 Other speech and language deficits following cerebral infarction: Secondary | ICD-10-CM | POA: Diagnosis not present

## 2021-07-12 DIAGNOSIS — I69391 Dysphagia following cerebral infarction: Secondary | ICD-10-CM | POA: Diagnosis not present

## 2021-07-13 DIAGNOSIS — I69354 Hemiplegia and hemiparesis following cerebral infarction affecting left non-dominant side: Secondary | ICD-10-CM | POA: Diagnosis not present

## 2021-07-13 DIAGNOSIS — I69328 Other speech and language deficits following cerebral infarction: Secondary | ICD-10-CM | POA: Diagnosis not present

## 2021-07-13 DIAGNOSIS — G35 Multiple sclerosis: Secondary | ICD-10-CM | POA: Diagnosis not present

## 2021-07-13 DIAGNOSIS — R1312 Dysphagia, oropharyngeal phase: Secondary | ICD-10-CM | POA: Diagnosis not present

## 2021-07-13 DIAGNOSIS — I69392 Facial weakness following cerebral infarction: Secondary | ICD-10-CM | POA: Diagnosis not present

## 2021-07-13 DIAGNOSIS — I69391 Dysphagia following cerebral infarction: Secondary | ICD-10-CM | POA: Diagnosis not present

## 2021-07-14 DIAGNOSIS — G35 Multiple sclerosis: Secondary | ICD-10-CM | POA: Diagnosis not present

## 2021-07-14 DIAGNOSIS — Z9181 History of falling: Secondary | ICD-10-CM | POA: Diagnosis not present

## 2021-07-14 DIAGNOSIS — I69392 Facial weakness following cerebral infarction: Secondary | ICD-10-CM | POA: Diagnosis not present

## 2021-07-14 DIAGNOSIS — I69354 Hemiplegia and hemiparesis following cerebral infarction affecting left non-dominant side: Secondary | ICD-10-CM | POA: Diagnosis not present

## 2021-07-14 DIAGNOSIS — Z90710 Acquired absence of both cervix and uterus: Secondary | ICD-10-CM | POA: Diagnosis not present

## 2021-07-14 DIAGNOSIS — Z8781 Personal history of (healed) traumatic fracture: Secondary | ICD-10-CM | POA: Diagnosis not present

## 2021-07-14 DIAGNOSIS — E7800 Pure hypercholesterolemia, unspecified: Secondary | ICD-10-CM | POA: Diagnosis not present

## 2021-07-14 DIAGNOSIS — E559 Vitamin D deficiency, unspecified: Secondary | ICD-10-CM | POA: Diagnosis not present

## 2021-07-14 DIAGNOSIS — M1990 Unspecified osteoarthritis, unspecified site: Secondary | ICD-10-CM | POA: Diagnosis not present

## 2021-07-14 DIAGNOSIS — M21372 Foot drop, left foot: Secondary | ICD-10-CM | POA: Diagnosis not present

## 2021-07-14 DIAGNOSIS — M80051S Age-related osteoporosis with current pathological fracture, right femur, sequela: Secondary | ICD-10-CM | POA: Diagnosis not present

## 2021-07-14 DIAGNOSIS — Z7902 Long term (current) use of antithrombotics/antiplatelets: Secondary | ICD-10-CM | POA: Diagnosis not present

## 2021-07-14 DIAGNOSIS — I2510 Atherosclerotic heart disease of native coronary artery without angina pectoris: Secondary | ICD-10-CM | POA: Diagnosis not present

## 2021-07-14 DIAGNOSIS — M8580 Other specified disorders of bone density and structure, unspecified site: Secondary | ICD-10-CM | POA: Diagnosis not present

## 2021-07-14 DIAGNOSIS — R1312 Dysphagia, oropharyngeal phase: Secondary | ICD-10-CM | POA: Diagnosis not present

## 2021-07-14 DIAGNOSIS — R32 Unspecified urinary incontinence: Secondary | ICD-10-CM | POA: Diagnosis not present

## 2021-07-14 DIAGNOSIS — I69391 Dysphagia following cerebral infarction: Secondary | ICD-10-CM | POA: Diagnosis not present

## 2021-07-14 DIAGNOSIS — M316 Other giant cell arteritis: Secondary | ICD-10-CM | POA: Diagnosis not present

## 2021-07-14 DIAGNOSIS — I872 Venous insufficiency (chronic) (peripheral): Secondary | ICD-10-CM | POA: Diagnosis not present

## 2021-07-14 DIAGNOSIS — I5032 Chronic diastolic (congestive) heart failure: Secondary | ICD-10-CM | POA: Diagnosis not present

## 2021-07-14 DIAGNOSIS — M0600 Rheumatoid arthritis without rheumatoid factor, unspecified site: Secondary | ICD-10-CM | POA: Diagnosis not present

## 2021-07-14 DIAGNOSIS — I110 Hypertensive heart disease with heart failure: Secondary | ICD-10-CM | POA: Diagnosis not present

## 2021-07-14 DIAGNOSIS — I69328 Other speech and language deficits following cerebral infarction: Secondary | ICD-10-CM | POA: Diagnosis not present

## 2021-07-14 DIAGNOSIS — M87251 Osteonecrosis due to previous trauma, right femur: Secondary | ICD-10-CM | POA: Diagnosis not present

## 2021-07-14 DIAGNOSIS — E039 Hypothyroidism, unspecified: Secondary | ICD-10-CM | POA: Diagnosis not present

## 2021-07-16 ENCOUNTER — Other Ambulatory Visit: Payer: Self-pay | Admitting: *Deleted

## 2021-07-16 DIAGNOSIS — R1312 Dysphagia, oropharyngeal phase: Secondary | ICD-10-CM | POA: Diagnosis not present

## 2021-07-16 DIAGNOSIS — I69354 Hemiplegia and hemiparesis following cerebral infarction affecting left non-dominant side: Secondary | ICD-10-CM | POA: Diagnosis not present

## 2021-07-16 DIAGNOSIS — I69392 Facial weakness following cerebral infarction: Secondary | ICD-10-CM | POA: Diagnosis not present

## 2021-07-16 DIAGNOSIS — I69328 Other speech and language deficits following cerebral infarction: Secondary | ICD-10-CM | POA: Diagnosis not present

## 2021-07-16 DIAGNOSIS — I69391 Dysphagia following cerebral infarction: Secondary | ICD-10-CM | POA: Diagnosis not present

## 2021-07-16 DIAGNOSIS — G35 Multiple sclerosis: Secondary | ICD-10-CM | POA: Diagnosis not present

## 2021-07-16 NOTE — Patient Outreach (Signed)
[place] [facility]) Care Management  07/16/2021  Shaye Loiselle 12-06-1932 [id number]   Outreach attempt #2, unsuccessful, unable to leave voice message.  Will send outreach letter and follow up within the next 3-5 business days.  Valente David, [place], MSN [place] [phone]

## 2021-07-17 DIAGNOSIS — I69328 Other speech and language deficits following cerebral infarction: Secondary | ICD-10-CM | POA: Diagnosis not present

## 2021-07-17 DIAGNOSIS — I69392 Facial weakness following cerebral infarction: Secondary | ICD-10-CM | POA: Diagnosis not present

## 2021-07-17 DIAGNOSIS — I69391 Dysphagia following cerebral infarction: Secondary | ICD-10-CM | POA: Diagnosis not present

## 2021-07-17 DIAGNOSIS — G35 Multiple sclerosis: Secondary | ICD-10-CM | POA: Diagnosis not present

## 2021-07-17 DIAGNOSIS — R1312 Dysphagia, oropharyngeal phase: Secondary | ICD-10-CM | POA: Diagnosis not present

## 2021-07-17 DIAGNOSIS — I69354 Hemiplegia and hemiparesis following cerebral infarction affecting left non-dominant side: Secondary | ICD-10-CM | POA: Diagnosis not present

## 2021-07-18 ENCOUNTER — Other Ambulatory Visit: Payer: Self-pay | Admitting: Neurology

## 2021-07-18 DIAGNOSIS — R1312 Dysphagia, oropharyngeal phase: Secondary | ICD-10-CM | POA: Diagnosis not present

## 2021-07-18 DIAGNOSIS — I69391 Dysphagia following cerebral infarction: Secondary | ICD-10-CM | POA: Diagnosis not present

## 2021-07-18 DIAGNOSIS — I69392 Facial weakness following cerebral infarction: Secondary | ICD-10-CM | POA: Diagnosis not present

## 2021-07-18 DIAGNOSIS — G35 Multiple sclerosis: Secondary | ICD-10-CM | POA: Diagnosis not present

## 2021-07-18 DIAGNOSIS — I69328 Other speech and language deficits following cerebral infarction: Secondary | ICD-10-CM | POA: Diagnosis not present

## 2021-07-18 DIAGNOSIS — I69354 Hemiplegia and hemiparesis following cerebral infarction affecting left non-dominant side: Secondary | ICD-10-CM | POA: Diagnosis not present

## 2021-07-19 ENCOUNTER — Encounter: Payer: Self-pay | Admitting: Neurology

## 2021-07-19 ENCOUNTER — Telehealth (INDEPENDENT_AMBULATORY_CARE_PROVIDER_SITE_OTHER): Payer: Medicare Other | Admitting: Neurology

## 2021-07-19 DIAGNOSIS — Z8781 Personal history of (healed) traumatic fracture: Secondary | ICD-10-CM | POA: Diagnosis not present

## 2021-07-19 DIAGNOSIS — G35 Multiple sclerosis: Secondary | ICD-10-CM

## 2021-07-19 DIAGNOSIS — R39198 Other difficulties with micturition: Secondary | ICD-10-CM | POA: Diagnosis not present

## 2021-07-19 DIAGNOSIS — I6381 Other cerebral infarction due to occlusion or stenosis of small artery: Secondary | ICD-10-CM | POA: Diagnosis not present

## 2021-07-19 DIAGNOSIS — M069 Rheumatoid arthritis, unspecified: Secondary | ICD-10-CM | POA: Diagnosis not present

## 2021-07-19 NOTE — Progress Notes (Signed)
[facility]  PATIENT: [name] DOB: 21-Jan-1932  REFERRING DOCTOR OR PCP:  [name] SOURCE:   Patient, notes from Dr. Marisue Humble and Dr. Catalina Gravel, imaging results, MRI images on PACS.  _________________________________   HISTORICAL  CHIEF COMPLAINT:  No chief complaint on file.   HISTORY OF PRESENT ILLNESS:  [name] is an 87 year old woman with probable primary progressive multiple sclerosis.    Update 07/19/2021: Virtual Visit via Video Note I connected with [name] on 07/19/21 at  1:00 PM EDT by telephone as patient unable to access telemedicine application and verified that I am speaking with the correct person.  I discussed the limitations of evaluation and management by telemedicine and the availability of in person appointments. The patient expressed understanding and agreed to proceed.  Patient was home.  Provide in office   History of Present Illness: She had a stroke last month (right basal ganglia) and lost the use of her left side.   She is doing PT and OT.  She was already wheelchair bound since a right hip fracture in 2021.   Currently needing assistance with a sliding board and was doing better before the stroke.    Bladder is fine.    She also notes difficulty with her vision.   She gets some severe cramps at times despite baclofen.  The worse spasms are at night.  She takes   She  broke her hip with a fall 03/2020.   Due to the nature of the fracture (closed right acetabular fx with superimposes humeral head fx. ), she could not get a hip replacement.   She did not heal well and is still wheelchair bound as she can not bear weight on the right.   She continues to have right hip pain.    She takes Tylenol  Sometimes she gets spasms in her legs but this is not constant.   Baclofen helps more than methocarbamol   She has mild right hand weakness and  more recent left arm weakness.    She can transfer to a toilet with  assistance.   She has some urinary incontinence at times but is usually continent   Other:  She also has RA and is now off leflunomide and methotrexate.      She saw Dr. Ferrel Logan at [facility] for a second opinion regarding her MS.      MS History:    She has had difficulty with her gait for many years. Her left leg seemed clumsy since at least 2000.   Her left toes would drag at times and her balance was poor.    About 2011, she fell at church and broke her pelvis tripping over a gown.   Her gait continues to worsen.   Her right leg does well.   Her left arm is slightly clumsy and slightly weaker compared to her right.     All of her changes have been gradual.     She denies any numbness.   In 2014, she had MRIs showing many T2/FLAIR hyperintense foci in the brain. At her age,  the pattern was felt to be nonspecific. However, MRI of the cervical and thoracic spine showed a plaque adjacent to C2 to the right and a plaque adjacent to T6 to the left  IMAGING: MRIs of her brain and spine in 11/11/2013 were reviewed.   The MRI of the cervical spine shows a focus to the right at C2 and another focus to  the left at C6. It also shows multilevel degenerative changes with anterolisthesis of C4 upon C5 but no nerve root compression.  No spinal cord compression.  The MRI of the thoracic spine shows a focus to the left adjacent to T6. It also showed a chronic T9 compression fracture (she was never aware of this).  MRI of the brain shows many T2/FLAIR hyperintense foci.  Foci are non-specific though many of the foci are radially oriented to the ventricles in the periventricular white matter. Some foci in the pons have an appearance more typical for chronic microvascular ischemic change. MRI 2014 showed a chronic compression fracture at T9 and she can not think of no other episode of significant back pain.  MRI brain 06/07/2021 showed acute right basal ganglia CVA.  Stable white matter changes as before elsewhere.    FH: Her  one paternal first cousin and 2 maternal cousins have MS.    She does not know whether they had PPMS or RRMS.    ALLERGIES: Allergies  Allergen Reactions   Sulfasalazine Anaphylaxis   Aspirin Nausea Only   Penicillins Diarrhea   Sulfa Antibiotics    Sulfamethoxazole-Trimethoprim Other (See Comments)    HOME MEDICATIONS:  Current Outpatient Medications:    acetaminophen (TYLENOL) 325 MG tablet, Take 2 tablets (650 mg total) by mouth every 6 (six) hours as needed for mild pain (or temp > 37.5 C (99.5 F))., Disp: , Rfl:    aspirin EC 81 MG EC tablet, Take 1 tablet (81 mg total) by mouth daily. Swallow whole., Disp: 30 tablet, Rfl: 2   atorvastatin (LIPITOR) 80 MG tablet, Take 1 tablet (80 mg total) by mouth daily., Disp: 30 tablet, Rfl: 1   baclofen (LIORESAL) 10 MG tablet, Take 10 mg by mouth 3 (three) times daily., Disp: , Rfl:    Biotin 10000 MCG TABS, Take 1,000 mcg by mouth daily., Disp: , Rfl:    Calcium Carb-Cholecalciferol (CALCIUM-VITAMIN D3) 250-125 MG-UNIT TABS, Take 1 tablet by mouth 2 (two) times daily., Disp: , Rfl:    Calcium Carbonate-Vitamin D 600-200 MG-UNIT CAPS, Take 1 capsule by mouth daily., Disp: , Rfl:    Carboxymethylcellul-Glycerin (LUBRICATING EYE DROPS OP), Place 1 drop into both eyes 2 (two) times daily. For Eye Dryness, Disp: , Rfl:    diclofenac Sodium (VOLTAREN) 1 % GEL, APPLY 2 GMS TOPICALLY TO AFFECTED AREAS (RIGHT HIP) TWICE DAILY AS NEEDED FOR PAIN, Disp: 50 g, Rfl: 0   food thickener (SIMPLYTHICK, NECTAR/LEVEL 2/MILDLY THICK,) GEL, Please use with all fluids for nectar thick consistency, Disp: 100 packet, Rfl: 1   levothyroxine (SYNTHROID) 25 MCG tablet, Take 1 tablet (25 mcg total) by mouth daily., Disp: 30 tablet, Rfl: 0   Magnesium 250 MG TABS, Take 1 tablet by mouth daily., Disp: , Rfl:    Multiple Vitamins-Minerals (ICAPS AREDS FORMULA PO), Take 1 capsule by mouth daily. , Disp: , Rfl:    nabumetone (RELAFEN) 500 MG tablet, TAKE (1) TABLET TWICE A  DAY AS NEEDED., Disp: 60 tablet, Rfl: 1   pantoprazole (PROTONIX) 40 MG tablet, Take 1 tablet (40 mg total) by mouth daily., Disp: 30 tablet, Rfl: 1   Vitamin D, Ergocalciferol, (DRISDOL) 1.25 MG (50000 UNIT) CAPS capsule, Take 50,000 Units by mouth every 7 (seven) days., Disp: , Rfl:    vitamin E 45 MG (100 UNITS) capsule, Take 100 Units by mouth daily., Disp: , Rfl:   PAST MEDICAL HISTORY: Past Medical History:  Diagnosis Date   Chronic venous insufficiency  of lower extremity 01/25/2019   With chronic bilateral lower extremity edema   Incontinent of urine    Multiple sclerosis ([place]) 2015   Osteoporosis    osteopenia   Polyarthritis    Post-menopausal    Rheumatoid arthritis ([place])    Temporal arteritis ([place]) 2011    PAST SURGICAL HISTORY: Past Surgical History:  Procedure Laterality Date   ABDOMINAL HYSTERECTOMY  1971   TVH   breast ductectomy Right 05/1995   COLONOSCOPY  10/2011    FAMILY HISTORY: Family History  Problem Relation Age of Onset   Osteoarthritis Father    Stroke Father    Heart failure Mother    Hypertension Mother    Heart disease Mother    Multiple sclerosis Cousin    Multiple sclerosis Cousin    Multiple sclerosis Cousin     SOCIAL HISTORY:      DIAGNOSTIC DATA (LABS, IMAGING, TESTING) - I reviewed patient records, labs, notes, testing and imaging myself where available.  Lab Results  Component Value Date   WBC 10.4 06/11/2021   HGB 13.2 06/11/2021   HCT 37.2 06/11/2021   MCV 91.0 06/11/2021   PLT 197 06/11/2021      Component Value Date/Time   NA 136 06/11/2021 0401   NA 135 (A) 06/05/2020 0000   K 3.7 06/11/2021 0401   CL 105 06/11/2021 0401   CO2 24 06/11/2021 0401   GLUCOSE 121 (H) 06/11/2021 0401   BUN 12 06/11/2021 0401   BUN 17 06/05/2020 0000   CREATININE 0.56 06/11/2021 0401   CREATININE 0.82 02/23/2019 1444   CALCIUM 8.3 (L) 06/11/2021 0401   PROT 6.2 (L) 06/07/2021 1141   ALBUMIN 3.9 06/07/2021 1141   AST 35  06/07/2021 1141   AST 27 02/23/2019 1444   ALT 19 06/07/2021 1141   ALT 27 02/23/2019 1444   ALKPHOS 67 06/07/2021 1141   BILITOT 0.7 06/07/2021 1141   BILITOT 0.4 02/23/2019 1444   GFRNONAA >60 06/11/2021 0401   GFRNONAA >60 02/23/2019 1444   GFRAA >90 06/05/2020 0000   GFRAA >60 02/23/2019 1444       ASSESSMENT AND PLAN  Multiple sclerosis, primary progressive (HCC)  Urinary dysfunction  Rheumatoid arthritis, involving unspecified site, unspecified whether rheumatoid factor present ([place])  S/P right hip fracture  Stroke of right basal ganglia ([place])   1.   Will continue off of a DMT.  She appears to have PPMS though I can't rule out inactive SPMS.  Progression has been very slow and risks of Ocrevus likely outweigh the benefits.   Additionally, she has RA but stopped methotrexate. 2.   Stay active as possible.  Use wheelchair for safety.   Increase activity if advised by orthopedics.   3.   Nabumetone for pain.    3.   Increase baclofen to 10 mg po qAM and 15 mg po qHS to try to help night time spasms more 4.   rtc 12 months or sooner if new or worsening neurologic issues.       Follow Up Instructions: I discussed the assessment and treatment plan with the patient. The patient was provided an opportunity to ask questions and all were answered. The patient agreed with the plan and demonstrated an understanding of the instructions.    The patient was advised to call back or seek an in-person evaluation if the symptoms worsen or if the condition fails to improve as anticipated.  I provided 22 minutes of non-face-to-face time during this encounter.  Jamieson Lisa A. Felecia Shelling, MD, PhD, Charlynn Grimes   07/19/2021, 1:26 PM Director of the [place] at [facility] Certified in Neurology, [facility], Sleep Medicine, Pain Medicine and [facility] [address] [place], [place] [address] [phone]

## 2021-07-20 DIAGNOSIS — I69354 Hemiplegia and hemiparesis following cerebral infarction affecting left non-dominant side: Secondary | ICD-10-CM | POA: Diagnosis not present

## 2021-07-20 DIAGNOSIS — I69392 Facial weakness following cerebral infarction: Secondary | ICD-10-CM | POA: Diagnosis not present

## 2021-07-20 DIAGNOSIS — G35 Multiple sclerosis: Secondary | ICD-10-CM | POA: Diagnosis not present

## 2021-07-20 DIAGNOSIS — I69391 Dysphagia following cerebral infarction: Secondary | ICD-10-CM | POA: Diagnosis not present

## 2021-07-20 DIAGNOSIS — I69328 Other speech and language deficits following cerebral infarction: Secondary | ICD-10-CM | POA: Diagnosis not present

## 2021-07-20 DIAGNOSIS — R1312 Dysphagia, oropharyngeal phase: Secondary | ICD-10-CM | POA: Diagnosis not present

## 2021-07-23 ENCOUNTER — Other Ambulatory Visit: Payer: Self-pay | Admitting: *Deleted

## 2021-07-23 DIAGNOSIS — I69354 Hemiplegia and hemiparesis following cerebral infarction affecting left non-dominant side: Secondary | ICD-10-CM | POA: Diagnosis not present

## 2021-07-23 DIAGNOSIS — R1312 Dysphagia, oropharyngeal phase: Secondary | ICD-10-CM | POA: Diagnosis not present

## 2021-07-23 DIAGNOSIS — G35 Multiple sclerosis: Secondary | ICD-10-CM | POA: Diagnosis not present

## 2021-07-23 DIAGNOSIS — I69391 Dysphagia following cerebral infarction: Secondary | ICD-10-CM | POA: Diagnosis not present

## 2021-07-23 DIAGNOSIS — I69328 Other speech and language deficits following cerebral infarction: Secondary | ICD-10-CM | POA: Diagnosis not present

## 2021-07-23 DIAGNOSIS — I69392 Facial weakness following cerebral infarction: Secondary | ICD-10-CM | POA: Diagnosis not present

## 2021-07-23 NOTE — Patient Outreach (Signed)
[facility]) Care Management  07/23/2021  Coumba Fossey 06/12/1932 [id number]   Outreach attempt #2, successful.  Report she is doing well, appreciative of her 24/7 support system.  Denies any urgent concerns, encouraged to contact this care manager with questions.  Agrees to follow up within the next month.   Goals Addressed             This Visit's Progress    THN - Keep or Improve My Strength-Stroke   On track    Timeframe:  Long-Range Goal Priority:  High Start Date:      7/11                       Expected End Date:     12/11                  Barriers: Knowledge    - arrange in-home help services - eat healthy to increase strength - know who to call for help if I fall    Why is this important?   Before the stroke you probably did not think much about being safe when you are up and about.  Now, it may be harder for you to get around.  It may also be easier for you to trip or fall.  It is common to have muscle weakness after a stroke. You may also feel like you cannot control an arm or leg.  It will be helpful to work with a physical therapist to get your strength and muscle control back.  It is good to stay as active as you can. Walking and stretching help you stay strong and flexible.  The physical therapist will develop an exercise program just for you.     Notes:   7/11 - Per son, has home health PT, OT, and SLP.  Also has caregivers 24/7 in the home.  7/19 - Confirms home health remains involved, feels she is increasing in strength  8/15 - Feels she has made progress with help of PT, OT, and speech therapy sessions.  State they have recommended that she have a power chart instead of her lightweight wheelchair, will see PCP in office Thursday for assessment and orders.     [facility] - Make and Keep All Appointments   On track    Timeframe:  Short-Term Goal Priority:  Medium Start Date:      7/11                       Expected End Date:     9/11  Barriers: Transportation - depends on aide and/or virtual visits                    Follow Up Date 07/02/2021    - call to cancel if needed - keep a calendar with appointment dates    Why is this important?   Part of staying healthy is seeing the doctor for follow-up care.  If you forget your appointments, there are some things you can do to stay on track.    Notes:   7/11 - has virtual appointment with PCP in the next couple days, will have follow up with neurology on 8/11  7/19 - will have virtual appointment with PCP tomorrow, will reassess to see if she is able to go to office for neurology follow up.  If not, she will request virtual  8/15 - Virtual appointment with neurology  completed on 8/11, no changes in plan of care.  State she has office visit with PCP on 8/18.         Valente David, [place], MSN [place] [phone]

## 2021-07-26 DIAGNOSIS — I69354 Hemiplegia and hemiparesis following cerebral infarction affecting left non-dominant side: Secondary | ICD-10-CM | POA: Diagnosis not present

## 2021-07-26 DIAGNOSIS — R262 Difficulty in walking, not elsewhere classified: Secondary | ICD-10-CM | POA: Diagnosis not present

## 2021-07-26 DIAGNOSIS — I69392 Facial weakness following cerebral infarction: Secondary | ICD-10-CM | POA: Diagnosis not present

## 2021-07-26 DIAGNOSIS — I69328 Other speech and language deficits following cerebral infarction: Secondary | ICD-10-CM | POA: Diagnosis not present

## 2021-07-26 DIAGNOSIS — G35 Multiple sclerosis: Secondary | ICD-10-CM | POA: Diagnosis not present

## 2021-07-26 DIAGNOSIS — R1312 Dysphagia, oropharyngeal phase: Secondary | ICD-10-CM | POA: Diagnosis not present

## 2021-07-26 DIAGNOSIS — Z8673 Personal history of transient ischemic attack (TIA), and cerebral infarction without residual deficits: Secondary | ICD-10-CM | POA: Diagnosis not present

## 2021-07-26 DIAGNOSIS — I69391 Dysphagia following cerebral infarction: Secondary | ICD-10-CM | POA: Diagnosis not present

## 2021-07-26 DIAGNOSIS — S72009A Fracture of unspecified part of neck of unspecified femur, initial encounter for closed fracture: Secondary | ICD-10-CM | POA: Diagnosis not present

## 2021-07-26 DIAGNOSIS — M1990 Unspecified osteoarthritis, unspecified site: Secondary | ICD-10-CM | POA: Diagnosis not present

## 2021-07-26 DIAGNOSIS — M069 Rheumatoid arthritis, unspecified: Secondary | ICD-10-CM | POA: Diagnosis not present

## 2021-07-26 DIAGNOSIS — M25512 Pain in left shoulder: Secondary | ICD-10-CM | POA: Diagnosis not present

## 2021-07-27 DIAGNOSIS — I69392 Facial weakness following cerebral infarction: Secondary | ICD-10-CM | POA: Diagnosis not present

## 2021-07-27 DIAGNOSIS — G35 Multiple sclerosis: Secondary | ICD-10-CM | POA: Diagnosis not present

## 2021-07-27 DIAGNOSIS — I69354 Hemiplegia and hemiparesis following cerebral infarction affecting left non-dominant side: Secondary | ICD-10-CM | POA: Diagnosis not present

## 2021-07-27 DIAGNOSIS — I69391 Dysphagia following cerebral infarction: Secondary | ICD-10-CM | POA: Diagnosis not present

## 2021-07-27 DIAGNOSIS — I69328 Other speech and language deficits following cerebral infarction: Secondary | ICD-10-CM | POA: Diagnosis not present

## 2021-07-27 DIAGNOSIS — R1312 Dysphagia, oropharyngeal phase: Secondary | ICD-10-CM | POA: Diagnosis not present

## 2021-07-28 DIAGNOSIS — G35 Multiple sclerosis: Secondary | ICD-10-CM | POA: Diagnosis not present

## 2021-07-28 DIAGNOSIS — I69392 Facial weakness following cerebral infarction: Secondary | ICD-10-CM | POA: Diagnosis not present

## 2021-07-28 DIAGNOSIS — I69328 Other speech and language deficits following cerebral infarction: Secondary | ICD-10-CM | POA: Diagnosis not present

## 2021-07-28 DIAGNOSIS — I69354 Hemiplegia and hemiparesis following cerebral infarction affecting left non-dominant side: Secondary | ICD-10-CM | POA: Diagnosis not present

## 2021-07-28 DIAGNOSIS — I69391 Dysphagia following cerebral infarction: Secondary | ICD-10-CM | POA: Diagnosis not present

## 2021-07-28 DIAGNOSIS — R1312 Dysphagia, oropharyngeal phase: Secondary | ICD-10-CM | POA: Diagnosis not present

## 2021-07-30 ENCOUNTER — Ambulatory Visit (INDEPENDENT_AMBULATORY_CARE_PROVIDER_SITE_OTHER): Payer: Medicare Other | Admitting: Orthopedic Surgery

## 2021-07-30 ENCOUNTER — Encounter: Payer: Self-pay | Admitting: Orthopedic Surgery

## 2021-07-30 ENCOUNTER — Ambulatory Visit (INDEPENDENT_AMBULATORY_CARE_PROVIDER_SITE_OTHER): Payer: Medicare Other

## 2021-07-30 DIAGNOSIS — G8929 Other chronic pain: Secondary | ICD-10-CM

## 2021-07-30 DIAGNOSIS — M7502 Adhesive capsulitis of left shoulder: Secondary | ICD-10-CM

## 2021-07-30 DIAGNOSIS — I69328 Other speech and language deficits following cerebral infarction: Secondary | ICD-10-CM | POA: Diagnosis not present

## 2021-07-30 DIAGNOSIS — I63311 Cerebral infarction due to thrombosis of right middle cerebral artery: Secondary | ICD-10-CM | POA: Diagnosis not present

## 2021-07-30 DIAGNOSIS — M25512 Pain in left shoulder: Secondary | ICD-10-CM | POA: Diagnosis not present

## 2021-07-30 DIAGNOSIS — I69392 Facial weakness following cerebral infarction: Secondary | ICD-10-CM | POA: Diagnosis not present

## 2021-07-30 DIAGNOSIS — R1312 Dysphagia, oropharyngeal phase: Secondary | ICD-10-CM | POA: Diagnosis not present

## 2021-07-30 DIAGNOSIS — G35 Multiple sclerosis: Secondary | ICD-10-CM | POA: Diagnosis not present

## 2021-07-30 DIAGNOSIS — I69354 Hemiplegia and hemiparesis following cerebral infarction affecting left non-dominant side: Secondary | ICD-10-CM | POA: Diagnosis not present

## 2021-07-30 DIAGNOSIS — I69391 Dysphagia following cerebral infarction: Secondary | ICD-10-CM | POA: Diagnosis not present

## 2021-07-30 MED ORDER — METHYLPREDNISOLONE ACETATE 40 MG/ML IJ SUSP
40.0000 mg | INTRAMUSCULAR | Status: AC | PRN
  Administered 2021-07-30: 40 mg via INTRA_ARTICULAR

## 2021-07-30 MED ORDER — LIDOCAINE HCL 1 % IJ SOLN
5.0000 mL | INTRAMUSCULAR | Status: AC | PRN
  Administered 2021-07-30: 5 mL

## 2021-07-30 NOTE — Progress Notes (Signed)
Office Visit Note   Patient: [name]           Date of Birth: [birth year]           MRN: [id number] Visit Date: 07/30/2021              Requested by: [name], MD [address] [place],  [place] [address] PCP: [name], MD  Chief Complaint  Patient presents with   Left Shoulder - Pain      HPI: Patient is a 87 year old woman who complains of decreasing range of motion and increasing pain in the left shoulder.  Patient states the pain is worse anteriorly.  Patient states she has to use her arms to ambulate in her wheelchair.  Assessment & Plan: Visit Diagnoses:  1. Chronic left shoulder pain   2. Adhesive capsulitis of left shoulder     Plan: Discussed that with patient's adhesive capsulitis a subacromial injection may be helpful.  Patient tolerated the injection well we will follow-up as needed.  Discussed the importance of using her arm is much as possible to maintain range of motion.  Follow-Up Instructions: Return if symptoms worsen or fail to improve.   Ortho Exam  Patient is alert, oriented, no adenopathy, well-dressed, normal affect, normal respiratory effort. Examination patient has active abduction and flexion of only 70 degrees passively she has abduction and flexion to 90 degrees she has internal and external rotation of only about 30 degrees with adhesive capsulitis of the left shoulder.  The biceps tendon is not tender to palpation.  Imaging: XR Shoulder Left  Result Date: 07/30/2021 Three-view radiographs of the left shoulder shows a congruent glenohumeral joint with no superior migration of the humeral head within the glenoid.  No images are attached to the encounter.  Labs: Lab Results  Component Value Date   HGBA1C 5.4 06/08/2021   ESRSEDRATE 108 (H) 04/03/2010   REPTSTATUS 03/24/2020 FINAL 03/23/2020   CULT (A) 03/23/2020    <10,000 COLONIES/mL INSIGNIFICANT GROWTH Performed at [facility], [place] [address] [place], [place] [address]      Lab Results  Component Value Date   ALBUMIN 3.9 06/07/2021   ALBUMIN 4.0 05/30/2020   ALBUMIN 4.0 05/03/2020    No results found for: MG No results found for: VD25OH  No results found for: PREALBUMIN CBC EXTENDED Latest Ref Rng & Units 06/11/2021 06/10/2021 06/09/2021  WBC 4.0 - 10.5 K/uL 10.4 8.2 7.0  RBC 3.87 - 5.11 MIL/uL 4.09 3.84(L) 3.94  HGB 12.0 - 15.0 g/dL 13.2 12.1 12.5  HCT 36.0 - 46.0 % 37.2 35.0(L) 36.2  PLT 150 - 400 K/uL 197 179 180  NEUTROABS 1.7 - 7.7 K/uL - - -  LYMPHSABS 0.7 - 4.0 K/uL - - -     There is no height or weight on file to calculate BMI.  Orders:  Orders Placed This Encounter  Procedures   XR Shoulder Left   No orders of the defined types were placed in this encounter.    Procedures: Large Joint Inj on 07/30/2021 10:59 AM Indications: diagnostic evaluation and pain Details: 22 G 1.5 in needle, posterior approach  Arthrogram: No  Medications: 5 mL lidocaine 1 %; 40 mg methylPREDNISolone acetate 40 MG/ML Outcome: tolerated well, no immediate complications Procedure, treatment alternatives, risks and benefits explained, specific risks discussed. Consent was given by the patient. Immediately prior to procedure a time out was called to verify the correct patient, procedure, equipment, support  staff and site/side marked as required. Patient was prepped and draped in the usual sterile fashion.     Clinical Data: No additional findings.  ROS:  All other systems negative, except as noted in the HPI. Review of Systems  Objective: Vital Signs: LMP 09/08/1970 (Approximate)   Specialty Comments:  No specialty comments available.  PMFS History: Patient Active Problem List   Diagnosis Date Noted   S/P right hip fracture 07/19/2021   Stroke of right basal ganglia ([place]) 06/08/2021   Acute CVA (cerebrovascular accident) ([place]) 06/07/2021   Atherosclerotic heart disease of native coronary artery without  angina pectoris 04/13/2021   Chronic edema 04/13/2021   Difficulty in walking, not elsewhere classified 04/13/2021   Frail elderly 04/13/2021   Giant cell arteritis ([place]) 04/13/2021   Hypercholesterolemia 04/13/2021   Hypertensive heart disease without congestive heart failure 04/13/2021   Hypomagnesemia 04/13/2021   Osteoarthritis 04/13/2021   Protein calorie malnutrition ([place]) 04/13/2021   Age-related osteoporosis without current pathological fracture 12/13/2020   Congestive heart failure ([place]) 12/13/2020   Monoclonal paraproteinemia 12/13/2020   Multiple sclerosis ([place]) 12/13/2020   Unspecified abnormal finding in specimens from other organs, systems and tissues 12/13/2020   Vitamin D deficiency 12/13/2020   Hyponatremia 04/09/2020   Acetabular fracture ([place]) 04/01/2020   Closed right ischial fracture ([place]) 04/01/2020   Hypertension 04/01/2020   Hypothyroidism 04/01/2020   Frequent falls 03/23/2020   Rheumatoid arthritis ([place]) 03/23/2020   Tympanic membrane central perforation 12/15/2019   Cholesteatoma of external auditory canal, right 09/10/2019   Hearing loss 09/10/2019   Chronic venous insufficiency of lower extremity 01/25/2019   Grade II diastolic dysfunction [id number]   Urinary dysfunction 12/22/2017   Multiple sclerosis, primary progressive ([place]) 06/19/2017   Left foot drop 03/17/2017   Primary chronic progressive multiple sclerosis ([place]) 03/17/2017   Myelopathy ([place]) 03/17/2017   Bilateral lower extremity edema 03/17/2017   Past Medical History:  Diagnosis Date   Chronic venous insufficiency of lower extremity 01/25/2019   With chronic bilateral lower extremity edema   Incontinent of urine    Multiple sclerosis ([place]) 2015   Osteoporosis    osteopenia   Polyarthritis    Post-menopausal    Rheumatoid arthritis ([place])    Temporal arteritis ([place]) 2011    Family History  Problem Relation Age of Onset   Osteoarthritis Father    Stroke Father    Heart failure  Mother    Hypertension Mother    Heart disease Mother    Multiple sclerosis Cousin    Multiple sclerosis Cousin    Multiple sclerosis Cousin     Past Surgical History:  Procedure Laterality Date   ABDOMINAL HYSTERECTOMY  1971   TVH   breast ductectomy Right 05/1995   COLONOSCOPY  10/2011   Social History   Occupational History   Not on file  Tobacco Use   Smoking status: Never   Smokeless tobacco: Never  Substance and Sexual Activity   Alcohol use: No   Drug use: No   Sexual activity: Not Currently    Partners: Male    Birth control/protection: Post-menopausal, Surgical    Comment: hysterectomy

## 2021-07-31 DIAGNOSIS — I69392 Facial weakness following cerebral infarction: Secondary | ICD-10-CM | POA: Diagnosis not present

## 2021-07-31 DIAGNOSIS — I69391 Dysphagia following cerebral infarction: Secondary | ICD-10-CM | POA: Diagnosis not present

## 2021-07-31 DIAGNOSIS — I69328 Other speech and language deficits following cerebral infarction: Secondary | ICD-10-CM | POA: Diagnosis not present

## 2021-07-31 DIAGNOSIS — R1312 Dysphagia, oropharyngeal phase: Secondary | ICD-10-CM | POA: Diagnosis not present

## 2021-07-31 DIAGNOSIS — G35 Multiple sclerosis: Secondary | ICD-10-CM | POA: Diagnosis not present

## 2021-07-31 DIAGNOSIS — I69354 Hemiplegia and hemiparesis following cerebral infarction affecting left non-dominant side: Secondary | ICD-10-CM | POA: Diagnosis not present

## 2021-08-01 DIAGNOSIS — I69391 Dysphagia following cerebral infarction: Secondary | ICD-10-CM | POA: Diagnosis not present

## 2021-08-01 DIAGNOSIS — G35 Multiple sclerosis: Secondary | ICD-10-CM | POA: Diagnosis not present

## 2021-08-01 DIAGNOSIS — I69354 Hemiplegia and hemiparesis following cerebral infarction affecting left non-dominant side: Secondary | ICD-10-CM | POA: Diagnosis not present

## 2021-08-01 DIAGNOSIS — R1312 Dysphagia, oropharyngeal phase: Secondary | ICD-10-CM | POA: Diagnosis not present

## 2021-08-01 DIAGNOSIS — I69328 Other speech and language deficits following cerebral infarction: Secondary | ICD-10-CM | POA: Diagnosis not present

## 2021-08-01 DIAGNOSIS — I69392 Facial weakness following cerebral infarction: Secondary | ICD-10-CM | POA: Diagnosis not present

## 2021-08-02 DIAGNOSIS — G35 Multiple sclerosis: Secondary | ICD-10-CM | POA: Diagnosis not present

## 2021-08-02 DIAGNOSIS — R1312 Dysphagia, oropharyngeal phase: Secondary | ICD-10-CM | POA: Diagnosis not present

## 2021-08-02 DIAGNOSIS — I69391 Dysphagia following cerebral infarction: Secondary | ICD-10-CM | POA: Diagnosis not present

## 2021-08-02 DIAGNOSIS — I69328 Other speech and language deficits following cerebral infarction: Secondary | ICD-10-CM | POA: Diagnosis not present

## 2021-08-02 DIAGNOSIS — I69392 Facial weakness following cerebral infarction: Secondary | ICD-10-CM | POA: Diagnosis not present

## 2021-08-02 DIAGNOSIS — I69354 Hemiplegia and hemiparesis following cerebral infarction affecting left non-dominant side: Secondary | ICD-10-CM | POA: Diagnosis not present

## 2021-08-06 DIAGNOSIS — I69328 Other speech and language deficits following cerebral infarction: Secondary | ICD-10-CM | POA: Diagnosis not present

## 2021-08-06 DIAGNOSIS — I69391 Dysphagia following cerebral infarction: Secondary | ICD-10-CM | POA: Diagnosis not present

## 2021-08-06 DIAGNOSIS — I69354 Hemiplegia and hemiparesis following cerebral infarction affecting left non-dominant side: Secondary | ICD-10-CM | POA: Diagnosis not present

## 2021-08-06 DIAGNOSIS — G35 Multiple sclerosis: Secondary | ICD-10-CM | POA: Diagnosis not present

## 2021-08-06 DIAGNOSIS — R1312 Dysphagia, oropharyngeal phase: Secondary | ICD-10-CM | POA: Diagnosis not present

## 2021-08-06 DIAGNOSIS — I69392 Facial weakness following cerebral infarction: Secondary | ICD-10-CM | POA: Diagnosis not present

## 2021-08-08 DIAGNOSIS — I69391 Dysphagia following cerebral infarction: Secondary | ICD-10-CM | POA: Diagnosis not present

## 2021-08-08 DIAGNOSIS — G35 Multiple sclerosis: Secondary | ICD-10-CM | POA: Diagnosis not present

## 2021-08-08 DIAGNOSIS — I69392 Facial weakness following cerebral infarction: Secondary | ICD-10-CM | POA: Diagnosis not present

## 2021-08-08 DIAGNOSIS — R1312 Dysphagia, oropharyngeal phase: Secondary | ICD-10-CM | POA: Diagnosis not present

## 2021-08-08 DIAGNOSIS — I69328 Other speech and language deficits following cerebral infarction: Secondary | ICD-10-CM | POA: Diagnosis not present

## 2021-08-08 DIAGNOSIS — I69354 Hemiplegia and hemiparesis following cerebral infarction affecting left non-dominant side: Secondary | ICD-10-CM | POA: Diagnosis not present

## 2021-08-10 DIAGNOSIS — I69328 Other speech and language deficits following cerebral infarction: Secondary | ICD-10-CM | POA: Diagnosis not present

## 2021-08-10 DIAGNOSIS — I69354 Hemiplegia and hemiparesis following cerebral infarction affecting left non-dominant side: Secondary | ICD-10-CM | POA: Diagnosis not present

## 2021-08-10 DIAGNOSIS — I69392 Facial weakness following cerebral infarction: Secondary | ICD-10-CM | POA: Diagnosis not present

## 2021-08-10 DIAGNOSIS — G35 Multiple sclerosis: Secondary | ICD-10-CM | POA: Diagnosis not present

## 2021-08-10 DIAGNOSIS — R1312 Dysphagia, oropharyngeal phase: Secondary | ICD-10-CM | POA: Diagnosis not present

## 2021-08-10 DIAGNOSIS — I69391 Dysphagia following cerebral infarction: Secondary | ICD-10-CM | POA: Diagnosis not present

## 2021-08-13 DIAGNOSIS — M87251 Osteonecrosis due to previous trauma, right femur: Secondary | ICD-10-CM | POA: Diagnosis not present

## 2021-08-13 DIAGNOSIS — M21371 Foot drop, right foot: Secondary | ICD-10-CM | POA: Diagnosis not present

## 2021-08-13 DIAGNOSIS — I69328 Other speech and language deficits following cerebral infarction: Secondary | ICD-10-CM | POA: Diagnosis not present

## 2021-08-13 DIAGNOSIS — M0600 Rheumatoid arthritis without rheumatoid factor, unspecified site: Secondary | ICD-10-CM | POA: Diagnosis not present

## 2021-08-13 DIAGNOSIS — I69392 Facial weakness following cerebral infarction: Secondary | ICD-10-CM | POA: Diagnosis not present

## 2021-08-13 DIAGNOSIS — M1990 Unspecified osteoarthritis, unspecified site: Secondary | ICD-10-CM | POA: Diagnosis not present

## 2021-08-13 DIAGNOSIS — Z90710 Acquired absence of both cervix and uterus: Secondary | ICD-10-CM | POA: Diagnosis not present

## 2021-08-13 DIAGNOSIS — R32 Unspecified urinary incontinence: Secondary | ICD-10-CM | POA: Diagnosis not present

## 2021-08-13 DIAGNOSIS — M316 Other giant cell arteritis: Secondary | ICD-10-CM | POA: Diagnosis not present

## 2021-08-13 DIAGNOSIS — I5032 Chronic diastolic (congestive) heart failure: Secondary | ICD-10-CM | POA: Diagnosis not present

## 2021-08-13 DIAGNOSIS — E7800 Pure hypercholesterolemia, unspecified: Secondary | ICD-10-CM | POA: Diagnosis not present

## 2021-08-13 DIAGNOSIS — Z7902 Long term (current) use of antithrombotics/antiplatelets: Secondary | ICD-10-CM | POA: Diagnosis not present

## 2021-08-13 DIAGNOSIS — Z8781 Personal history of (healed) traumatic fracture: Secondary | ICD-10-CM | POA: Diagnosis not present

## 2021-08-13 DIAGNOSIS — I2510 Atherosclerotic heart disease of native coronary artery without angina pectoris: Secondary | ICD-10-CM | POA: Diagnosis not present

## 2021-08-13 DIAGNOSIS — I872 Venous insufficiency (chronic) (peripheral): Secondary | ICD-10-CM | POA: Diagnosis not present

## 2021-08-13 DIAGNOSIS — E559 Vitamin D deficiency, unspecified: Secondary | ICD-10-CM | POA: Diagnosis not present

## 2021-08-13 DIAGNOSIS — G35 Multiple sclerosis: Secondary | ICD-10-CM | POA: Diagnosis not present

## 2021-08-13 DIAGNOSIS — M8580 Other specified disorders of bone density and structure, unspecified site: Secondary | ICD-10-CM | POA: Diagnosis not present

## 2021-08-13 DIAGNOSIS — I69354 Hemiplegia and hemiparesis following cerebral infarction affecting left non-dominant side: Secondary | ICD-10-CM | POA: Diagnosis not present

## 2021-08-13 DIAGNOSIS — R1312 Dysphagia, oropharyngeal phase: Secondary | ICD-10-CM | POA: Diagnosis not present

## 2021-08-13 DIAGNOSIS — Z9181 History of falling: Secondary | ICD-10-CM | POA: Diagnosis not present

## 2021-08-13 DIAGNOSIS — E039 Hypothyroidism, unspecified: Secondary | ICD-10-CM | POA: Diagnosis not present

## 2021-08-13 DIAGNOSIS — M80051S Age-related osteoporosis with current pathological fracture, right femur, sequela: Secondary | ICD-10-CM | POA: Diagnosis not present

## 2021-08-13 DIAGNOSIS — I110 Hypertensive heart disease with heart failure: Secondary | ICD-10-CM | POA: Diagnosis not present

## 2021-08-13 DIAGNOSIS — I69391 Dysphagia following cerebral infarction: Secondary | ICD-10-CM | POA: Diagnosis not present

## 2021-08-15 DIAGNOSIS — I69392 Facial weakness following cerebral infarction: Secondary | ICD-10-CM | POA: Diagnosis not present

## 2021-08-15 DIAGNOSIS — R1312 Dysphagia, oropharyngeal phase: Secondary | ICD-10-CM | POA: Diagnosis not present

## 2021-08-15 DIAGNOSIS — G35 Multiple sclerosis: Secondary | ICD-10-CM | POA: Diagnosis not present

## 2021-08-15 DIAGNOSIS — I69391 Dysphagia following cerebral infarction: Secondary | ICD-10-CM | POA: Diagnosis not present

## 2021-08-15 DIAGNOSIS — I69354 Hemiplegia and hemiparesis following cerebral infarction affecting left non-dominant side: Secondary | ICD-10-CM | POA: Diagnosis not present

## 2021-08-15 DIAGNOSIS — I69328 Other speech and language deficits following cerebral infarction: Secondary | ICD-10-CM | POA: Diagnosis not present

## 2021-08-17 DIAGNOSIS — R1312 Dysphagia, oropharyngeal phase: Secondary | ICD-10-CM | POA: Diagnosis not present

## 2021-08-17 DIAGNOSIS — G35 Multiple sclerosis: Secondary | ICD-10-CM | POA: Diagnosis not present

## 2021-08-17 DIAGNOSIS — I69392 Facial weakness following cerebral infarction: Secondary | ICD-10-CM | POA: Diagnosis not present

## 2021-08-17 DIAGNOSIS — I69328 Other speech and language deficits following cerebral infarction: Secondary | ICD-10-CM | POA: Diagnosis not present

## 2021-08-17 DIAGNOSIS — I69354 Hemiplegia and hemiparesis following cerebral infarction affecting left non-dominant side: Secondary | ICD-10-CM | POA: Diagnosis not present

## 2021-08-17 DIAGNOSIS — I69391 Dysphagia following cerebral infarction: Secondary | ICD-10-CM | POA: Diagnosis not present

## 2021-08-20 DIAGNOSIS — I69354 Hemiplegia and hemiparesis following cerebral infarction affecting left non-dominant side: Secondary | ICD-10-CM | POA: Diagnosis not present

## 2021-08-20 DIAGNOSIS — R1312 Dysphagia, oropharyngeal phase: Secondary | ICD-10-CM | POA: Diagnosis not present

## 2021-08-20 DIAGNOSIS — I69391 Dysphagia following cerebral infarction: Secondary | ICD-10-CM | POA: Diagnosis not present

## 2021-08-20 DIAGNOSIS — I69392 Facial weakness following cerebral infarction: Secondary | ICD-10-CM | POA: Diagnosis not present

## 2021-08-20 DIAGNOSIS — G35 Multiple sclerosis: Secondary | ICD-10-CM | POA: Diagnosis not present

## 2021-08-20 DIAGNOSIS — I69328 Other speech and language deficits following cerebral infarction: Secondary | ICD-10-CM | POA: Diagnosis not present

## 2021-08-21 DIAGNOSIS — R1312 Dysphagia, oropharyngeal phase: Secondary | ICD-10-CM | POA: Diagnosis not present

## 2021-08-21 DIAGNOSIS — I69328 Other speech and language deficits following cerebral infarction: Secondary | ICD-10-CM | POA: Diagnosis not present

## 2021-08-21 DIAGNOSIS — I69392 Facial weakness following cerebral infarction: Secondary | ICD-10-CM | POA: Diagnosis not present

## 2021-08-21 DIAGNOSIS — I69354 Hemiplegia and hemiparesis following cerebral infarction affecting left non-dominant side: Secondary | ICD-10-CM | POA: Diagnosis not present

## 2021-08-21 DIAGNOSIS — G35 Multiple sclerosis: Secondary | ICD-10-CM | POA: Diagnosis not present

## 2021-08-21 DIAGNOSIS — I69391 Dysphagia following cerebral infarction: Secondary | ICD-10-CM | POA: Diagnosis not present

## 2021-08-22 ENCOUNTER — Other Ambulatory Visit: Payer: Self-pay | Admitting: *Deleted

## 2021-08-22 DIAGNOSIS — I69392 Facial weakness following cerebral infarction: Secondary | ICD-10-CM | POA: Diagnosis not present

## 2021-08-22 DIAGNOSIS — I69391 Dysphagia following cerebral infarction: Secondary | ICD-10-CM | POA: Diagnosis not present

## 2021-08-22 DIAGNOSIS — I69328 Other speech and language deficits following cerebral infarction: Secondary | ICD-10-CM | POA: Diagnosis not present

## 2021-08-22 DIAGNOSIS — G35 Multiple sclerosis: Secondary | ICD-10-CM | POA: Diagnosis not present

## 2021-08-22 DIAGNOSIS — I69354 Hemiplegia and hemiparesis following cerebral infarction affecting left non-dominant side: Secondary | ICD-10-CM | POA: Diagnosis not present

## 2021-08-22 DIAGNOSIS — R1312 Dysphagia, oropharyngeal phase: Secondary | ICD-10-CM | POA: Diagnosis not present

## 2021-08-22 NOTE — Patient Outreach (Signed)
[facility]) Care Management  08/22/2021  [name] 04-23-1932 [id number]   Outgoing call placed to member, state she is doing "ok."  State she is working toward becoming more independent in order to decrease need for 24/7 caregivers.  Denies any urgent concerns, encouraged to contact this care manager with questions.  Agrees to follow up within the next month.   Goals Addressed             This Visit's Progress    THN - Keep or Improve My Strength-Stroke   On track    Timeframe:  Long-Range Goal Priority:  High Start Date:      7/11                       Expected End Date:     12/11                  Barriers: Knowledge    - arrange in-home help services - eat healthy to increase strength - know who to call for help if I fall    Why is this important?   Before the stroke you probably did not think much about being safe when you are up and about.  Now, it may be harder for you to get around.  It may also be easier for you to trip or fall.  It is common to have muscle weakness after a stroke. You may also feel like you cannot control an arm or leg.  It will be helpful to work with a physical therapist to get your strength and muscle control back.  It is good to stay as active as you can. Walking and stretching help you stay strong and flexible.  The physical therapist will develop an exercise program just for you.     Notes:   7/11 - Per son, has home health PT, OT, and SLP.  Also has caregivers 24/7 in the home.  7/19 - Confirms home health remains involved, feels she is increasing in strength  8/15 - Feels she has made progress with help of PT, OT, and speech therapy sessions.  State they have recommended that she have a power chart instead of her lightweight wheelchair, will see PCP in office Thursday for assessment and orders.  9/14 - Report she feels as her progression has slowed, however reportedly doing well per therapists.  Continues  to work with PT, OT, and ST.  She has had someone to come do measurements for power chair, looking forward to receiving.       [facility] - Make and Keep All Appointments   On track    Timeframe:  Short-Term Goal Priority:  Medium Start Date:      7/11                       Expected End Date:    9/11  Barriers: Transportation - depends on aide and/or virtual visits                    Follow Up Date 07/02/2021    - call to cancel if needed - keep a calendar with appointment dates    Why is this important?   Part of staying healthy is seeing the doctor for follow-up care.  If you forget your appointments, there are some things you can do to stay on track.    Notes:   7/11 - has virtual appointment  with PCP in the next couple days, will have follow up with neurology on 8/11  7/19 - will have virtual appointment with PCP tomorrow, will reassess to see if she is able to go to office for neurology follow up.  If not, she will request virtual  8/15 - Virtual appointment with neurology completed on 8/11, no changes in plan of care.  State she has office visit with PCP on 8/18.    9/14 - PCP visit complete, no concerns reported.  Was seen by ortho on 8/22 for shoulder pain, injection complete, having power chair will help alleviate pain.       Valente David, [place], MSN [place] [phone]

## 2021-08-24 DIAGNOSIS — I69354 Hemiplegia and hemiparesis following cerebral infarction affecting left non-dominant side: Secondary | ICD-10-CM | POA: Diagnosis not present

## 2021-08-24 DIAGNOSIS — I69328 Other speech and language deficits following cerebral infarction: Secondary | ICD-10-CM | POA: Diagnosis not present

## 2021-08-24 DIAGNOSIS — R1312 Dysphagia, oropharyngeal phase: Secondary | ICD-10-CM | POA: Diagnosis not present

## 2021-08-24 DIAGNOSIS — I69391 Dysphagia following cerebral infarction: Secondary | ICD-10-CM | POA: Diagnosis not present

## 2021-08-24 DIAGNOSIS — I69392 Facial weakness following cerebral infarction: Secondary | ICD-10-CM | POA: Diagnosis not present

## 2021-08-24 DIAGNOSIS — G35 Multiple sclerosis: Secondary | ICD-10-CM | POA: Diagnosis not present

## 2021-08-27 ENCOUNTER — Other Ambulatory Visit: Payer: Self-pay | Admitting: Neurology

## 2021-08-27 DIAGNOSIS — G35 Multiple sclerosis: Secondary | ICD-10-CM | POA: Diagnosis not present

## 2021-08-27 DIAGNOSIS — I69392 Facial weakness following cerebral infarction: Secondary | ICD-10-CM | POA: Diagnosis not present

## 2021-08-27 DIAGNOSIS — I69354 Hemiplegia and hemiparesis following cerebral infarction affecting left non-dominant side: Secondary | ICD-10-CM | POA: Diagnosis not present

## 2021-08-27 DIAGNOSIS — I69328 Other speech and language deficits following cerebral infarction: Secondary | ICD-10-CM | POA: Diagnosis not present

## 2021-08-27 DIAGNOSIS — I69391 Dysphagia following cerebral infarction: Secondary | ICD-10-CM | POA: Diagnosis not present

## 2021-08-27 DIAGNOSIS — R1312 Dysphagia, oropharyngeal phase: Secondary | ICD-10-CM | POA: Diagnosis not present

## 2021-08-28 DIAGNOSIS — Z961 Presence of intraocular lens: Secondary | ICD-10-CM | POA: Diagnosis not present

## 2021-08-28 DIAGNOSIS — H353132 Nonexudative age-related macular degeneration, bilateral, intermediate dry stage: Secondary | ICD-10-CM | POA: Diagnosis not present

## 2021-08-29 ENCOUNTER — Telehealth: Payer: Self-pay | Admitting: Neurology

## 2021-08-29 DIAGNOSIS — I69328 Other speech and language deficits following cerebral infarction: Secondary | ICD-10-CM | POA: Diagnosis not present

## 2021-08-29 DIAGNOSIS — I69392 Facial weakness following cerebral infarction: Secondary | ICD-10-CM | POA: Diagnosis not present

## 2021-08-29 DIAGNOSIS — G35 Multiple sclerosis: Secondary | ICD-10-CM | POA: Diagnosis not present

## 2021-08-29 DIAGNOSIS — I69391 Dysphagia following cerebral infarction: Secondary | ICD-10-CM | POA: Diagnosis not present

## 2021-08-29 DIAGNOSIS — I69354 Hemiplegia and hemiparesis following cerebral infarction affecting left non-dominant side: Secondary | ICD-10-CM | POA: Diagnosis not present

## 2021-08-29 DIAGNOSIS — R1312 Dysphagia, oropharyngeal phase: Secondary | ICD-10-CM | POA: Diagnosis not present

## 2021-08-29 NOTE — Telephone Encounter (Signed)
Raquel Sarna from [facility] asking about the correct dosage for the pt's baclofen (LIORESAL) 10 MG tablet. Requesting a call back.

## 2021-08-29 NOTE — Telephone Encounter (Signed)
Called and spoke w/ Nira Conn at pharmacy. She transferred me to [facility]. Advised per Dr. Garth Bigness last OV note: "Increase baclofen to 10 mg po qAM and 15 mg po qHS to try to help night time spasms more". She states pt reported she had been taking 10 q am and 20mg  qhs. She will call pt back to clarify directions and will have her reach out to our office if she has any further questions.

## 2021-08-30 DIAGNOSIS — I69391 Dysphagia following cerebral infarction: Secondary | ICD-10-CM | POA: Diagnosis not present

## 2021-08-30 DIAGNOSIS — I69392 Facial weakness following cerebral infarction: Secondary | ICD-10-CM | POA: Diagnosis not present

## 2021-08-30 DIAGNOSIS — I69354 Hemiplegia and hemiparesis following cerebral infarction affecting left non-dominant side: Secondary | ICD-10-CM | POA: Diagnosis not present

## 2021-08-30 DIAGNOSIS — G35 Multiple sclerosis: Secondary | ICD-10-CM | POA: Diagnosis not present

## 2021-08-30 DIAGNOSIS — R1312 Dysphagia, oropharyngeal phase: Secondary | ICD-10-CM | POA: Diagnosis not present

## 2021-08-30 DIAGNOSIS — I69328 Other speech and language deficits following cerebral infarction: Secondary | ICD-10-CM | POA: Diagnosis not present

## 2021-08-31 DIAGNOSIS — I69354 Hemiplegia and hemiparesis following cerebral infarction affecting left non-dominant side: Secondary | ICD-10-CM | POA: Diagnosis not present

## 2021-08-31 DIAGNOSIS — I69391 Dysphagia following cerebral infarction: Secondary | ICD-10-CM | POA: Diagnosis not present

## 2021-08-31 DIAGNOSIS — I69328 Other speech and language deficits following cerebral infarction: Secondary | ICD-10-CM | POA: Diagnosis not present

## 2021-08-31 DIAGNOSIS — I69392 Facial weakness following cerebral infarction: Secondary | ICD-10-CM | POA: Diagnosis not present

## 2021-08-31 DIAGNOSIS — R1312 Dysphagia, oropharyngeal phase: Secondary | ICD-10-CM | POA: Diagnosis not present

## 2021-08-31 DIAGNOSIS — G35 Multiple sclerosis: Secondary | ICD-10-CM | POA: Diagnosis not present

## 2021-09-03 DIAGNOSIS — G35 Multiple sclerosis: Secondary | ICD-10-CM | POA: Diagnosis not present

## 2021-09-03 DIAGNOSIS — R1312 Dysphagia, oropharyngeal phase: Secondary | ICD-10-CM | POA: Diagnosis not present

## 2021-09-03 DIAGNOSIS — I69354 Hemiplegia and hemiparesis following cerebral infarction affecting left non-dominant side: Secondary | ICD-10-CM | POA: Diagnosis not present

## 2021-09-03 DIAGNOSIS — I69391 Dysphagia following cerebral infarction: Secondary | ICD-10-CM | POA: Diagnosis not present

## 2021-09-03 DIAGNOSIS — I69392 Facial weakness following cerebral infarction: Secondary | ICD-10-CM | POA: Diagnosis not present

## 2021-09-03 DIAGNOSIS — I69328 Other speech and language deficits following cerebral infarction: Secondary | ICD-10-CM | POA: Diagnosis not present

## 2021-09-05 DIAGNOSIS — I69354 Hemiplegia and hemiparesis following cerebral infarction affecting left non-dominant side: Secondary | ICD-10-CM | POA: Diagnosis not present

## 2021-09-05 DIAGNOSIS — G35 Multiple sclerosis: Secondary | ICD-10-CM | POA: Diagnosis not present

## 2021-09-05 DIAGNOSIS — R1312 Dysphagia, oropharyngeal phase: Secondary | ICD-10-CM | POA: Diagnosis not present

## 2021-09-05 DIAGNOSIS — I69391 Dysphagia following cerebral infarction: Secondary | ICD-10-CM | POA: Diagnosis not present

## 2021-09-05 DIAGNOSIS — I69392 Facial weakness following cerebral infarction: Secondary | ICD-10-CM | POA: Diagnosis not present

## 2021-09-05 DIAGNOSIS — I69328 Other speech and language deficits following cerebral infarction: Secondary | ICD-10-CM | POA: Diagnosis not present

## 2021-09-06 DIAGNOSIS — I69392 Facial weakness following cerebral infarction: Secondary | ICD-10-CM | POA: Diagnosis not present

## 2021-09-06 DIAGNOSIS — I69354 Hemiplegia and hemiparesis following cerebral infarction affecting left non-dominant side: Secondary | ICD-10-CM | POA: Diagnosis not present

## 2021-09-06 DIAGNOSIS — R1312 Dysphagia, oropharyngeal phase: Secondary | ICD-10-CM | POA: Diagnosis not present

## 2021-09-06 DIAGNOSIS — I69328 Other speech and language deficits following cerebral infarction: Secondary | ICD-10-CM | POA: Diagnosis not present

## 2021-09-06 DIAGNOSIS — G35 Multiple sclerosis: Secondary | ICD-10-CM | POA: Diagnosis not present

## 2021-09-06 DIAGNOSIS — I69391 Dysphagia following cerebral infarction: Secondary | ICD-10-CM | POA: Diagnosis not present

## 2021-09-07 DIAGNOSIS — I69392 Facial weakness following cerebral infarction: Secondary | ICD-10-CM | POA: Diagnosis not present

## 2021-09-07 DIAGNOSIS — G35 Multiple sclerosis: Secondary | ICD-10-CM | POA: Diagnosis not present

## 2021-09-07 DIAGNOSIS — I69354 Hemiplegia and hemiparesis following cerebral infarction affecting left non-dominant side: Secondary | ICD-10-CM | POA: Diagnosis not present

## 2021-09-07 DIAGNOSIS — I69391 Dysphagia following cerebral infarction: Secondary | ICD-10-CM | POA: Diagnosis not present

## 2021-09-07 DIAGNOSIS — I69328 Other speech and language deficits following cerebral infarction: Secondary | ICD-10-CM | POA: Diagnosis not present

## 2021-09-07 DIAGNOSIS — R1312 Dysphagia, oropharyngeal phase: Secondary | ICD-10-CM | POA: Diagnosis not present

## 2021-09-10 DIAGNOSIS — E871 Hypo-osmolality and hyponatremia: Secondary | ICD-10-CM | POA: Diagnosis not present

## 2021-09-10 DIAGNOSIS — Z0000 Encounter for general adult medical examination without abnormal findings: Secondary | ICD-10-CM | POA: Diagnosis not present

## 2021-09-10 DIAGNOSIS — I69354 Hemiplegia and hemiparesis following cerebral infarction affecting left non-dominant side: Secondary | ICD-10-CM | POA: Diagnosis not present

## 2021-09-10 DIAGNOSIS — I69392 Facial weakness following cerebral infarction: Secondary | ICD-10-CM | POA: Diagnosis not present

## 2021-09-10 DIAGNOSIS — R54 Age-related physical debility: Secondary | ICD-10-CM | POA: Diagnosis not present

## 2021-09-10 DIAGNOSIS — I69391 Dysphagia following cerebral infarction: Secondary | ICD-10-CM | POA: Diagnosis not present

## 2021-09-10 DIAGNOSIS — I69328 Other speech and language deficits following cerebral infarction: Secondary | ICD-10-CM | POA: Diagnosis not present

## 2021-09-10 DIAGNOSIS — E559 Vitamin D deficiency, unspecified: Secondary | ICD-10-CM | POA: Diagnosis not present

## 2021-09-10 DIAGNOSIS — M810 Age-related osteoporosis without current pathological fracture: Secondary | ICD-10-CM | POA: Diagnosis not present

## 2021-09-10 DIAGNOSIS — G35 Multiple sclerosis: Secondary | ICD-10-CM | POA: Diagnosis not present

## 2021-09-10 DIAGNOSIS — E7800 Pure hypercholesterolemia, unspecified: Secondary | ICD-10-CM | POA: Diagnosis not present

## 2021-09-10 DIAGNOSIS — R1312 Dysphagia, oropharyngeal phase: Secondary | ICD-10-CM | POA: Diagnosis not present

## 2021-09-10 DIAGNOSIS — Z8673 Personal history of transient ischemic attack (TIA), and cerebral infarction without residual deficits: Secondary | ICD-10-CM | POA: Diagnosis not present

## 2021-09-10 DIAGNOSIS — Z1389 Encounter for screening for other disorder: Secondary | ICD-10-CM | POA: Diagnosis not present

## 2021-09-10 DIAGNOSIS — E039 Hypothyroidism, unspecified: Secondary | ICD-10-CM | POA: Diagnosis not present

## 2021-09-10 DIAGNOSIS — Z23 Encounter for immunization: Secondary | ICD-10-CM | POA: Diagnosis not present

## 2021-09-12 DIAGNOSIS — Z8781 Personal history of (healed) traumatic fracture: Secondary | ICD-10-CM | POA: Diagnosis not present

## 2021-09-12 DIAGNOSIS — R32 Unspecified urinary incontinence: Secondary | ICD-10-CM | POA: Diagnosis not present

## 2021-09-12 DIAGNOSIS — I69391 Dysphagia following cerebral infarction: Secondary | ICD-10-CM | POA: Diagnosis not present

## 2021-09-12 DIAGNOSIS — R1312 Dysphagia, oropharyngeal phase: Secondary | ICD-10-CM | POA: Diagnosis not present

## 2021-09-12 DIAGNOSIS — M80051S Age-related osteoporosis with current pathological fracture, right femur, sequela: Secondary | ICD-10-CM | POA: Diagnosis not present

## 2021-09-12 DIAGNOSIS — E7800 Pure hypercholesterolemia, unspecified: Secondary | ICD-10-CM | POA: Diagnosis not present

## 2021-09-12 DIAGNOSIS — I110 Hypertensive heart disease with heart failure: Secondary | ICD-10-CM | POA: Diagnosis not present

## 2021-09-12 DIAGNOSIS — M0600 Rheumatoid arthritis without rheumatoid factor, unspecified site: Secondary | ICD-10-CM | POA: Diagnosis not present

## 2021-09-12 DIAGNOSIS — G35 Multiple sclerosis: Secondary | ICD-10-CM | POA: Diagnosis not present

## 2021-09-12 DIAGNOSIS — I2510 Atherosclerotic heart disease of native coronary artery without angina pectoris: Secondary | ICD-10-CM | POA: Diagnosis not present

## 2021-09-12 DIAGNOSIS — I69354 Hemiplegia and hemiparesis following cerebral infarction affecting left non-dominant side: Secondary | ICD-10-CM | POA: Diagnosis not present

## 2021-09-12 DIAGNOSIS — M316 Other giant cell arteritis: Secondary | ICD-10-CM | POA: Diagnosis not present

## 2021-09-12 DIAGNOSIS — Z90710 Acquired absence of both cervix and uterus: Secondary | ICD-10-CM | POA: Diagnosis not present

## 2021-09-12 DIAGNOSIS — Z9181 History of falling: Secondary | ICD-10-CM | POA: Diagnosis not present

## 2021-09-12 DIAGNOSIS — Z7902 Long term (current) use of antithrombotics/antiplatelets: Secondary | ICD-10-CM | POA: Diagnosis not present

## 2021-09-12 DIAGNOSIS — I69392 Facial weakness following cerebral infarction: Secondary | ICD-10-CM | POA: Diagnosis not present

## 2021-09-12 DIAGNOSIS — M8580 Other specified disorders of bone density and structure, unspecified site: Secondary | ICD-10-CM | POA: Diagnosis not present

## 2021-09-12 DIAGNOSIS — I872 Venous insufficiency (chronic) (peripheral): Secondary | ICD-10-CM | POA: Diagnosis not present

## 2021-09-12 DIAGNOSIS — M21371 Foot drop, right foot: Secondary | ICD-10-CM | POA: Diagnosis not present

## 2021-09-12 DIAGNOSIS — M87251 Osteonecrosis due to previous trauma, right femur: Secondary | ICD-10-CM | POA: Diagnosis not present

## 2021-09-12 DIAGNOSIS — E559 Vitamin D deficiency, unspecified: Secondary | ICD-10-CM | POA: Diagnosis not present

## 2021-09-12 DIAGNOSIS — I69328 Other speech and language deficits following cerebral infarction: Secondary | ICD-10-CM | POA: Diagnosis not present

## 2021-09-12 DIAGNOSIS — E039 Hypothyroidism, unspecified: Secondary | ICD-10-CM | POA: Diagnosis not present

## 2021-09-12 DIAGNOSIS — I5032 Chronic diastolic (congestive) heart failure: Secondary | ICD-10-CM | POA: Diagnosis not present

## 2021-09-12 DIAGNOSIS — M1990 Unspecified osteoarthritis, unspecified site: Secondary | ICD-10-CM | POA: Diagnosis not present

## 2021-09-13 DIAGNOSIS — R1312 Dysphagia, oropharyngeal phase: Secondary | ICD-10-CM | POA: Diagnosis not present

## 2021-09-13 DIAGNOSIS — I69354 Hemiplegia and hemiparesis following cerebral infarction affecting left non-dominant side: Secondary | ICD-10-CM | POA: Diagnosis not present

## 2021-09-13 DIAGNOSIS — I69391 Dysphagia following cerebral infarction: Secondary | ICD-10-CM | POA: Diagnosis not present

## 2021-09-13 DIAGNOSIS — I69328 Other speech and language deficits following cerebral infarction: Secondary | ICD-10-CM | POA: Diagnosis not present

## 2021-09-13 DIAGNOSIS — G35 Multiple sclerosis: Secondary | ICD-10-CM | POA: Diagnosis not present

## 2021-09-13 DIAGNOSIS — I69392 Facial weakness following cerebral infarction: Secondary | ICD-10-CM | POA: Diagnosis not present

## 2021-09-14 DIAGNOSIS — H903 Sensorineural hearing loss, bilateral: Secondary | ICD-10-CM | POA: Diagnosis not present

## 2021-09-15 DIAGNOSIS — I69354 Hemiplegia and hemiparesis following cerebral infarction affecting left non-dominant side: Secondary | ICD-10-CM | POA: Diagnosis not present

## 2021-09-15 DIAGNOSIS — R1312 Dysphagia, oropharyngeal phase: Secondary | ICD-10-CM | POA: Diagnosis not present

## 2021-09-15 DIAGNOSIS — I69391 Dysphagia following cerebral infarction: Secondary | ICD-10-CM | POA: Diagnosis not present

## 2021-09-15 DIAGNOSIS — G35 Multiple sclerosis: Secondary | ICD-10-CM | POA: Diagnosis not present

## 2021-09-15 DIAGNOSIS — I69392 Facial weakness following cerebral infarction: Secondary | ICD-10-CM | POA: Diagnosis not present

## 2021-09-15 DIAGNOSIS — I69328 Other speech and language deficits following cerebral infarction: Secondary | ICD-10-CM | POA: Diagnosis not present

## 2021-09-17 ENCOUNTER — Other Ambulatory Visit: Payer: Self-pay | Admitting: *Deleted

## 2021-09-17 DIAGNOSIS — R1312 Dysphagia, oropharyngeal phase: Secondary | ICD-10-CM | POA: Diagnosis not present

## 2021-09-17 DIAGNOSIS — I69391 Dysphagia following cerebral infarction: Secondary | ICD-10-CM | POA: Diagnosis not present

## 2021-09-17 DIAGNOSIS — I69328 Other speech and language deficits following cerebral infarction: Secondary | ICD-10-CM | POA: Diagnosis not present

## 2021-09-17 DIAGNOSIS — I639 Cerebral infarction, unspecified: Secondary | ICD-10-CM

## 2021-09-17 DIAGNOSIS — I69354 Hemiplegia and hemiparesis following cerebral infarction affecting left non-dominant side: Secondary | ICD-10-CM | POA: Diagnosis not present

## 2021-09-17 DIAGNOSIS — I69392 Facial weakness following cerebral infarction: Secondary | ICD-10-CM | POA: Diagnosis not present

## 2021-09-17 DIAGNOSIS — G35 Multiple sclerosis: Secondary | ICD-10-CM | POA: Diagnosis not present

## 2021-09-17 NOTE — Patient Outreach (Signed)
[facility] [facility]) Care Management  [place]  09/17/2021   [name] 06-Aug-1932 [id number]  Outgoing call placed to member, state she is doing well. Continues to have 24/7 caregivers, blood pressure range is "good" at 130's/60-70's.  Denies any urgent concerns, encouraged to contact this care manager with questions.    Encounter Medications:  Outpatient Encounter Medications as of 09/17/2021  Medication Sig Note   acetaminophen (TYLENOL) 325 MG tablet Take 2 tablets (650 mg total) by mouth every 6 (six) hours as needed for mild pain (or temp > 37.5 C (99.5 F)).    aspirin EC 81 MG EC tablet Take 1 tablet (81 mg total) by mouth daily. Swallow whole.    atorvastatin (LIPITOR) 80 MG tablet Take 1 tablet (80 mg total) by mouth daily.    baclofen (LIORESAL) 10 MG tablet 10mg  in AM and 15mg  in PM    Biotin 10000 MCG TABS Take 1,000 mcg by mouth daily.    Calcium Carb-Cholecalciferol (CALCIUM-VITAMIN D3) 250-125 MG-UNIT TABS Take 1 tablet by mouth 2 (two) times daily.    Calcium Carbonate-Vitamin D 600-200 MG-UNIT CAPS Take 1 capsule by mouth daily.    Carboxymethylcellul-Glycerin (LUBRICATING EYE DROPS OP) Place 1 drop into both eyes 2 (two) times daily. For Eye Dryness    diclofenac Sodium (VOLTAREN) 1 % GEL APPLY 2 GMS TOPICALLY TO AFFECTED AREAS (RIGHT HIP) TWICE DAILY AS NEEDED FOR PAIN    food thickener (SIMPLYTHICK, NECTAR/LEVEL 2/MILDLY THICK,) GEL Please use with all fluids for nectar thick consistency    levothyroxine (SYNTHROID) 25 MCG tablet Take 1 tablet (25 mcg total) by mouth daily.    Magnesium 250 MG TABS Take 1 tablet by mouth daily.    Multiple Vitamins-Minerals (ICAPS AREDS FORMULA PO) Take 1 capsule by mouth daily.     nabumetone (RELAFEN) 500 MG tablet TAKE (1) TABLET TWICE A DAY AS NEEDED.    pantoprazole (PROTONIX) 40 MG tablet Take 1 tablet (40 mg total) by mouth daily.    Vitamin D, Ergocalciferol, (DRISDOL) 1.25 MG (50000 UNIT) CAPS  capsule Take 50,000 Units by mouth every 7 (seven) days. 06/07/2021: Take on Tuesdays    vitamin E 45 MG (100 UNITS) capsule Take 100 Units by mouth daily.    [DISCONTINUED] Abatacept (ORENCIA) 125 MG/ML SOSY 1 ml    [DISCONTINUED] Azelastine HCl 137 MCG/SPRAY SOLN Place 1 spray into both nostrils 2 (two) times daily as needed (As needed for allergies).    [DISCONTINUED] leflunomide (ARAVA) 10 MG tablet     No facility-administered encounter medications on file as of 09/17/2021.    Functional Status:  In your present state of health, do you have any difficulty performing the following activities: 06/08/2021  Hearing? N  Vision? N  Difficulty concentrating or making decisions? N  Walking or climbing stairs? Y  Dressing or bathing? N  Doing errands, shopping? N  Some recent data might be hidden    Fall/Depression Screening: Fall Risk  06/26/2021 04/23/2020  Falls in the past year? 0 -  Number falls in past yr: 0 -  Injury with Fall? 0 -  Risk for fall due to : Impaired mobility History of fall(s);Impaired balance/gait;Impaired mobility;Medication side effect;Orthopedic patient  Follow up Falls evaluation completed;Education provided Falls evaluation completed;Education provided;Falls prevention discussed  Comment - at [place] 2/9 Scores 06/26/2021  PHQ - 2 Score 1  Exception Documentation (No Data)    Assessment:   Care Plan Care Plan : Stroke (  Adult)  Updates made by Valente David, RN since 09/17/2021 12:00 AM     Problem: Long-Term Care Planning (Stroke)      Long-Range Goal: Effective Long-Term Care Planning   Start Date: 06/18/2021  Expected End Date: 10/16/2021  This Visit's Progress: On track  Recent Progress: On track  Note:   Evidence-based guidance:  Explain to patient and family/caregiver that it is vital to plan long-term changes in care needs; assess caregiver ability and desire to provide care.  Correlate the discussion of financial planning,  intensification of type or level of care and end of life care planning with patient's changing (or declining) function and cognitive ability.  Identify patient's values, goals and preferences to inform future care decisions.  Refer to financial navigator or Education officer, museum for thorough evaluation and resource assistance.  Facilitate family/caregiver meeting, especially when goal is to delay out-of-home placement.  Discuss Medicare or Medicaid, life and long-term care insurance, employee or retirement benefits, personal assets, veterans' benefits, social security or social security disability insurance as ways to meet costs.   Encourage inclusion of community resources, such as meal delivery, respite care, transportation services, local aging councils, adult day care and support groups.  Encourage investigation of services, such as home health aide, in-home skilled nursing, physical therapy, assisted living, skilled nursing facility or nursing home.  Discuss benefits of additional support referrals that may include spiritual advisor or chaplain, pain or palliative care specialist, hospice care and home healthcare.  Compassionately discuss advance care planning; if patient can make own decisions, encourage completion of advance directives and assist in identification of a proxy decision-maker.   Notes:       Goals Addressed             This Visit's Progress    [facility] - Find Help in My Community evidenced by obtaining affordable transportation resources       Timeframe:  Short-Term Goal Priority:  Medium Start Date:      09/17/2021                       Expected End Date:      12/18/2021                 Follow Up Date 11//06/2021   Barriers: Transportation    - follow-up on any referrals for help I am given - make a list of family or friends that I can call    Why is this important?   Knowing how and where to find help for yourself or family in your neighborhood and community is an  important skill.  You will want to take some steps to learn how.    Notes:   10/10 - Member report she and family paying $28 for Lucianne Lei transportation to MD visits.  Referral placed Care Guide for affordable transportation resources     [facility] - Keep or Improve My Strength-Stroke   On track    Timeframe:  Long-Range Goal Priority:  High Start Date:      7/11                       Expected End Date:     12/11                  Barriers: Knowledge    - arrange in-home help services - eat healthy to increase strength - know who to call for help if I fall  Why is this important?   Before the stroke you probably did not think much about being safe when you are up and about.  Now, it may be harder for you to get around.  It may also be easier for you to trip or fall.  It is common to have muscle weakness after a stroke. You may also feel like you cannot control an arm or leg.  It will be helpful to work with a physical therapist to get your strength and muscle control back.  It is good to stay as active as you can. Walking and stretching help you stay strong and flexible.  The physical therapist will develop an exercise program just for you.     Notes:   7/11 - Per son, has home health PT, OT, and SLP.  Also has caregivers 24/7 in the home.  7/19 - Confirms home health remains involved, feels she is increasing in strength  8/15 - Feels she has made progress with help of PT, OT, and speech therapy sessions.  State they have recommended that she have a power chart instead of her lightweight wheelchair, will see PCP in office Thursday for assessment and orders.  9/14 - Report she feels as her progression has slowed, however reportedly doing well per therapists.  Continues to work with PT, OT, and ST.  She has had someone to come do measurements for power chair, looking forward to receiving.    10/10 - Remains active with HH for PT/OT, awaiting power chair (state it will be delivered in the  next 60-90 days).  Will be able to be more active once she receives chair.     COMPLETED: [facility] - Make and Keep All Appointments   On track    Timeframe:  Short-Term Goal Priority:  Medium Start Date:      7/11                       Expected End Date:    9/11  Barriers: Transportation - depends on aide and/or virtual visits                      - call to cancel if needed - keep a calendar with appointment dates    Why is this important?   Part of staying healthy is seeing the doctor for follow-up care.  If you forget your appointments, there are some things you can do to stay on track.    Notes:   7/11 - has virtual appointment with PCP in the next couple days, will have follow up with neurology on 8/11  7/19 - will have virtual appointment with PCP tomorrow, will reassess to see if she is able to go to office for neurology follow up.  If not, she will request virtual  8/15 - Virtual appointment with neurology completed on 8/11, no changes in plan of care.  State she has office visit with PCP on 8/18.    9/14 - PCP visit complete, no concerns reported.  Was seen by ortho on 8/22 for shoulder pain, injection complete, having power chair will help alleviate pain.  10/10 - Visit with PCP was completed last week, received flu shot while there.  Will go to pharmacy to get Covid booster        Plan:  Follow-up: Patient agrees to Care Plan and Follow-up. Follow-up in 1 month(s).  Valente David, [place], MSN [place] [phone]

## 2021-09-18 DIAGNOSIS — I69391 Dysphagia following cerebral infarction: Secondary | ICD-10-CM | POA: Diagnosis not present

## 2021-09-18 DIAGNOSIS — R1312 Dysphagia, oropharyngeal phase: Secondary | ICD-10-CM | POA: Diagnosis not present

## 2021-09-18 DIAGNOSIS — I69354 Hemiplegia and hemiparesis following cerebral infarction affecting left non-dominant side: Secondary | ICD-10-CM | POA: Diagnosis not present

## 2021-09-18 DIAGNOSIS — I69392 Facial weakness following cerebral infarction: Secondary | ICD-10-CM | POA: Diagnosis not present

## 2021-09-18 DIAGNOSIS — I69328 Other speech and language deficits following cerebral infarction: Secondary | ICD-10-CM | POA: Diagnosis not present

## 2021-09-18 DIAGNOSIS — G35 Multiple sclerosis: Secondary | ICD-10-CM | POA: Diagnosis not present

## 2021-09-20 ENCOUNTER — Other Ambulatory Visit: Payer: Self-pay | Admitting: Neurology

## 2021-09-20 DIAGNOSIS — R1312 Dysphagia, oropharyngeal phase: Secondary | ICD-10-CM | POA: Diagnosis not present

## 2021-09-20 DIAGNOSIS — G35 Multiple sclerosis: Secondary | ICD-10-CM | POA: Diagnosis not present

## 2021-09-20 DIAGNOSIS — I69354 Hemiplegia and hemiparesis following cerebral infarction affecting left non-dominant side: Secondary | ICD-10-CM | POA: Diagnosis not present

## 2021-09-20 DIAGNOSIS — I69391 Dysphagia following cerebral infarction: Secondary | ICD-10-CM | POA: Diagnosis not present

## 2021-09-20 DIAGNOSIS — I69328 Other speech and language deficits following cerebral infarction: Secondary | ICD-10-CM | POA: Diagnosis not present

## 2021-09-20 DIAGNOSIS — I69392 Facial weakness following cerebral infarction: Secondary | ICD-10-CM | POA: Diagnosis not present

## 2021-09-21 ENCOUNTER — Telehealth: Payer: Self-pay | Admitting: Neurology

## 2021-09-21 NOTE — Telephone Encounter (Signed)
Pt called, having trouble sleeping with the leg cramps.Can baclofen (LIORESAL) 10 MG tablet dosage be increase? Would like a call from the nurse.

## 2021-09-24 DIAGNOSIS — I69328 Other speech and language deficits following cerebral infarction: Secondary | ICD-10-CM | POA: Diagnosis not present

## 2021-09-24 DIAGNOSIS — I69391 Dysphagia following cerebral infarction: Secondary | ICD-10-CM | POA: Diagnosis not present

## 2021-09-24 DIAGNOSIS — G35 Multiple sclerosis: Secondary | ICD-10-CM | POA: Diagnosis not present

## 2021-09-24 DIAGNOSIS — R1312 Dysphagia, oropharyngeal phase: Secondary | ICD-10-CM | POA: Diagnosis not present

## 2021-09-24 DIAGNOSIS — I69392 Facial weakness following cerebral infarction: Secondary | ICD-10-CM | POA: Diagnosis not present

## 2021-09-24 DIAGNOSIS — I69354 Hemiplegia and hemiparesis following cerebral infarction affecting left non-dominant side: Secondary | ICD-10-CM | POA: Diagnosis not present

## 2021-09-24 MED ORDER — BACLOFEN 10 MG PO TABS: ORAL_TABLET | ORAL | 5 refills | Status: DC

## 2021-09-24 NOTE — Telephone Encounter (Signed)
Called the patient. There was no answer. Left a message advising that Dr Felecia Shelling was ok with increasing the dose for the patient. He recommended 1 tab am, 1 tab early afternoon (3 p) and then 2 tablets at bedtime. Order has been placed for the increase dosage and resent to the pharmacy for the pt. I have called the pt to notify her and make aware of this change. There was no answer. LEFT vm advising.   **If pt calls back please reiterate that he has increased the dosage.

## 2021-09-28 ENCOUNTER — Telehealth: Payer: Self-pay | Admitting: *Deleted

## 2021-09-28 NOTE — Telephone Encounter (Signed)
   Telephone encounter was:  Successful.  09/28/2021 Name: [name] MRN: [id number] DOB: 1931-12-11  [name] is a 87 y.o. year old female who is a primary care patient of Ehinger, Herbie [place], MD . The community resource team was consulted for assistance with Transportation Needs patient daughter was provided healthcare transportation options via email and also via phone and has no further requests at this time   Care guide performed the following interventions: Patient provided with information about care guide support team and interviewed to confirm resource needs Follow up call placed to community resources to determine status of patients referral.  Follow Up Plan:  No further follow up planned at this time. The patient has been provided with needed resources.  [place], Care Management  [phone] [address] , [place] [address] Email : [email or web address]

## 2021-10-01 DIAGNOSIS — G35 Multiple sclerosis: Secondary | ICD-10-CM | POA: Diagnosis not present

## 2021-10-01 DIAGNOSIS — I69328 Other speech and language deficits following cerebral infarction: Secondary | ICD-10-CM | POA: Diagnosis not present

## 2021-10-01 DIAGNOSIS — I69354 Hemiplegia and hemiparesis following cerebral infarction affecting left non-dominant side: Secondary | ICD-10-CM | POA: Diagnosis not present

## 2021-10-01 DIAGNOSIS — R1312 Dysphagia, oropharyngeal phase: Secondary | ICD-10-CM | POA: Diagnosis not present

## 2021-10-01 DIAGNOSIS — I69391 Dysphagia following cerebral infarction: Secondary | ICD-10-CM | POA: Diagnosis not present

## 2021-10-01 DIAGNOSIS — I69392 Facial weakness following cerebral infarction: Secondary | ICD-10-CM | POA: Diagnosis not present

## 2021-10-03 DIAGNOSIS — I69391 Dysphagia following cerebral infarction: Secondary | ICD-10-CM | POA: Diagnosis not present

## 2021-10-03 DIAGNOSIS — I69392 Facial weakness following cerebral infarction: Secondary | ICD-10-CM | POA: Diagnosis not present

## 2021-10-03 DIAGNOSIS — I69354 Hemiplegia and hemiparesis following cerebral infarction affecting left non-dominant side: Secondary | ICD-10-CM | POA: Diagnosis not present

## 2021-10-03 DIAGNOSIS — R1312 Dysphagia, oropharyngeal phase: Secondary | ICD-10-CM | POA: Diagnosis not present

## 2021-10-03 DIAGNOSIS — G35 Multiple sclerosis: Secondary | ICD-10-CM | POA: Diagnosis not present

## 2021-10-03 DIAGNOSIS — I69328 Other speech and language deficits following cerebral infarction: Secondary | ICD-10-CM | POA: Diagnosis not present

## 2021-10-08 DIAGNOSIS — I69391 Dysphagia following cerebral infarction: Secondary | ICD-10-CM | POA: Diagnosis not present

## 2021-10-08 DIAGNOSIS — I69392 Facial weakness following cerebral infarction: Secondary | ICD-10-CM | POA: Diagnosis not present

## 2021-10-08 DIAGNOSIS — R1312 Dysphagia, oropharyngeal phase: Secondary | ICD-10-CM | POA: Diagnosis not present

## 2021-10-08 DIAGNOSIS — I69354 Hemiplegia and hemiparesis following cerebral infarction affecting left non-dominant side: Secondary | ICD-10-CM | POA: Diagnosis not present

## 2021-10-08 DIAGNOSIS — I69328 Other speech and language deficits following cerebral infarction: Secondary | ICD-10-CM | POA: Diagnosis not present

## 2021-10-08 DIAGNOSIS — G35 Multiple sclerosis: Secondary | ICD-10-CM | POA: Diagnosis not present

## 2021-10-11 DIAGNOSIS — G35 Multiple sclerosis: Secondary | ICD-10-CM | POA: Diagnosis not present

## 2021-10-11 DIAGNOSIS — I69328 Other speech and language deficits following cerebral infarction: Secondary | ICD-10-CM | POA: Diagnosis not present

## 2021-10-11 DIAGNOSIS — R1312 Dysphagia, oropharyngeal phase: Secondary | ICD-10-CM | POA: Diagnosis not present

## 2021-10-11 DIAGNOSIS — I69391 Dysphagia following cerebral infarction: Secondary | ICD-10-CM | POA: Diagnosis not present

## 2021-10-11 DIAGNOSIS — I69392 Facial weakness following cerebral infarction: Secondary | ICD-10-CM | POA: Diagnosis not present

## 2021-10-11 DIAGNOSIS — I69354 Hemiplegia and hemiparesis following cerebral infarction affecting left non-dominant side: Secondary | ICD-10-CM | POA: Diagnosis not present

## 2021-10-12 DIAGNOSIS — I5032 Chronic diastolic (congestive) heart failure: Secondary | ICD-10-CM | POA: Diagnosis not present

## 2021-10-12 DIAGNOSIS — E559 Vitamin D deficiency, unspecified: Secondary | ICD-10-CM | POA: Diagnosis not present

## 2021-10-12 DIAGNOSIS — E039 Hypothyroidism, unspecified: Secondary | ICD-10-CM | POA: Diagnosis not present

## 2021-10-12 DIAGNOSIS — I69354 Hemiplegia and hemiparesis following cerebral infarction affecting left non-dominant side: Secondary | ICD-10-CM | POA: Diagnosis not present

## 2021-10-12 DIAGNOSIS — I69328 Other speech and language deficits following cerebral infarction: Secondary | ICD-10-CM | POA: Diagnosis not present

## 2021-10-12 DIAGNOSIS — M316 Other giant cell arteritis: Secondary | ICD-10-CM | POA: Diagnosis not present

## 2021-10-12 DIAGNOSIS — M1990 Unspecified osteoarthritis, unspecified site: Secondary | ICD-10-CM | POA: Diagnosis not present

## 2021-10-12 DIAGNOSIS — E7800 Pure hypercholesterolemia, unspecified: Secondary | ICD-10-CM | POA: Diagnosis not present

## 2021-10-12 DIAGNOSIS — I69391 Dysphagia following cerebral infarction: Secondary | ICD-10-CM | POA: Diagnosis not present

## 2021-10-12 DIAGNOSIS — G35 Multiple sclerosis: Secondary | ICD-10-CM | POA: Diagnosis not present

## 2021-10-12 DIAGNOSIS — M0600 Rheumatoid arthritis without rheumatoid factor, unspecified site: Secondary | ICD-10-CM | POA: Diagnosis not present

## 2021-10-12 DIAGNOSIS — Z8781 Personal history of (healed) traumatic fracture: Secondary | ICD-10-CM | POA: Diagnosis not present

## 2021-10-12 DIAGNOSIS — R1312 Dysphagia, oropharyngeal phase: Secondary | ICD-10-CM | POA: Diagnosis not present

## 2021-10-12 DIAGNOSIS — R32 Unspecified urinary incontinence: Secondary | ICD-10-CM | POA: Diagnosis not present

## 2021-10-12 DIAGNOSIS — M21371 Foot drop, right foot: Secondary | ICD-10-CM | POA: Diagnosis not present

## 2021-10-12 DIAGNOSIS — M8580 Other specified disorders of bone density and structure, unspecified site: Secondary | ICD-10-CM | POA: Diagnosis not present

## 2021-10-12 DIAGNOSIS — Z90710 Acquired absence of both cervix and uterus: Secondary | ICD-10-CM | POA: Diagnosis not present

## 2021-10-12 DIAGNOSIS — M80051S Age-related osteoporosis with current pathological fracture, right femur, sequela: Secondary | ICD-10-CM | POA: Diagnosis not present

## 2021-10-12 DIAGNOSIS — M87251 Osteonecrosis due to previous trauma, right femur: Secondary | ICD-10-CM | POA: Diagnosis not present

## 2021-10-12 DIAGNOSIS — I110 Hypertensive heart disease with heart failure: Secondary | ICD-10-CM | POA: Diagnosis not present

## 2021-10-12 DIAGNOSIS — I2510 Atherosclerotic heart disease of native coronary artery without angina pectoris: Secondary | ICD-10-CM | POA: Diagnosis not present

## 2021-10-12 DIAGNOSIS — Z9181 History of falling: Secondary | ICD-10-CM | POA: Diagnosis not present

## 2021-10-12 DIAGNOSIS — Z7902 Long term (current) use of antithrombotics/antiplatelets: Secondary | ICD-10-CM | POA: Diagnosis not present

## 2021-10-12 DIAGNOSIS — I872 Venous insufficiency (chronic) (peripheral): Secondary | ICD-10-CM | POA: Diagnosis not present

## 2021-10-12 DIAGNOSIS — I69392 Facial weakness following cerebral infarction: Secondary | ICD-10-CM | POA: Diagnosis not present

## 2021-10-15 ENCOUNTER — Ambulatory Visit: Payer: Medicare Other | Admitting: *Deleted

## 2021-10-15 DIAGNOSIS — I69392 Facial weakness following cerebral infarction: Secondary | ICD-10-CM | POA: Diagnosis not present

## 2021-10-15 DIAGNOSIS — I69328 Other speech and language deficits following cerebral infarction: Secondary | ICD-10-CM | POA: Diagnosis not present

## 2021-10-15 DIAGNOSIS — I69391 Dysphagia following cerebral infarction: Secondary | ICD-10-CM | POA: Diagnosis not present

## 2021-10-15 DIAGNOSIS — R1312 Dysphagia, oropharyngeal phase: Secondary | ICD-10-CM | POA: Diagnosis not present

## 2021-10-15 DIAGNOSIS — I69354 Hemiplegia and hemiparesis following cerebral infarction affecting left non-dominant side: Secondary | ICD-10-CM | POA: Diagnosis not present

## 2021-10-15 DIAGNOSIS — G35 Multiple sclerosis: Secondary | ICD-10-CM | POA: Diagnosis not present

## 2021-10-17 ENCOUNTER — Other Ambulatory Visit: Payer: Self-pay | Admitting: *Deleted

## 2021-10-17 NOTE — Patient Outreach (Signed)
[facility]) Care Management  10/17/2021  Janilah Hojnacki [birth year] [id number]   Outgoing call placed to member.  State she is "alright."  Report sleeping better since Baclofen has been increased at bedtime.  Didn't feel she needed GERD medications, called PCP office and had it discontinued.  No GI issues reported.  Denies any urgent concerns, encouraged to contact this care manager with questions.  Agrees to follow up within the next month.  Care Plan : Stroke (Adult)  Updates made by Valente David, RN since 10/17/2021 12:00 AM     Problem: Long-Term Care Planning (Stroke)      Long-Range Goal: Effective Long-Term Care Planning Completed 10/17/2021  Start Date: 06/18/2021  Expected End Date: 10/16/2021  Recent Progress: On track  Note:   Evidence-based guidance:  Explain to patient and family/caregiver that it is vital to plan long-term changes in care needs; assess caregiver ability and desire to provide care.  Correlate the discussion of financial planning, intensification of type or level of care and end of life care planning with patient's changing (or declining) function and cognitive ability.  Identify patient's values, goals and preferences to inform future care decisions.  Refer to financial navigator or Education officer, museum for thorough evaluation and resource assistance.  Facilitate family/caregiver meeting, especially when goal is to delay out-of-home placement.  Discuss Medicare or Medicaid, life and long-term care insurance, employee or retirement benefits, personal assets, veterans' benefits, social security or social security disability insurance as ways to meet costs.   Encourage inclusion of community resources, such as meal delivery, respite care, transportation services, local aging councils, adult day care and support groups.  Encourage investigation of services, such as home health aide, in-home skilled nursing, physical therapy, assisted living,  skilled nursing facility or nursing home.  Discuss benefits of additional support referrals that may include spiritual advisor or chaplain, pain or palliative care specialist, hospice care and home healthcare.  Compassionately discuss advance care planning; if patient can make own decisions, encourage completion of advance directives and assist in identification of a proxy decision-maker.   Notes:   11/9 - Goal met, new care plan initiated    Task: Facilitate Planning for Long-Term Care Needs Completed 10/17/2021  Note:   Care Management Activities:    - decision-making supported - family involvement promoted    Notes:     Care Plan : [place] (Adult)  Updates made by Valente David, RN since 10/17/2021 12:00 AM     Problem: Difficulty with self care related to recent stroke and CHF   Priority: High     Long-Range Goal: Improved independence with management of self post stroke and for CHF   Start Date: 10/17/2021  Expected End Date: 01/15/2022  Priority: High  Note:   Current Barriers:  Chronic Disease Management support and education needs related to CHF and Stroke Transportation barriers  RNCM Clinical Goal(s):  Patient will verbalize understanding of plan for management of CHF and Stroke as evidenced by ability to self manage with minimal help from caregivers take all medications exactly as prescribed and will call provider for medication related questions as evidenced by reported medication compliance    attend all scheduled medical appointments: neurology in January as evidenced by attending provider appointment        work with community resource care guide to address needs related to Transportation and ADL IADL limitations as evidenced by patient and/or community resource care guide support    work with [facility]  and DME company to continue gaining strength and mobility as evidenced by obtaining power chair and ability to transfer from chair/bed/BSC  through collaboration  with Consulting civil engineer, provider, and care team.   Interventions:  Inter-disciplinary care team collaboration (see longitudinal plan of care) Evaluation of current treatment plan related to  self management and patient's adherence to plan as established by provider   Heart Failure Interventions:  (Status: New goal.)  Long Term Goal  Provided education on low sodium diet Reviewed role of diuretics in prevention of fluid overload and management of heart failure Discussed the importance of keeping all appointments with provider  Stroke  (Status: New goal.) Long Term Goal  Evaluation of current treatment plan related to  Stroke , ADL IADL limitations self-management and patient's adherence to plan as established by provider. Discussed plans with patient for ongoing care management follow up and provided patient with direct contact information for care management team Advised patient to continue [facility] PT/OT to increase strength and follow up with DME company for power chair order; Discussed plans with patient for ongoing care management follow up and provided patient with direct contact information for care management team;  Patient Goals/Self-Care Activities: Patient will self administer medications as prescribed as evidenced by self report/primary caregiver report  Patient will attend all scheduled provider appointments as evidenced by clinician review of documented attendance to scheduled appointments and patient/caregiver report Patient will continue to perform ADL's independently as evidenced by patient/caregiver report keep legs up while sitting use salt in moderation watch for swelling in feet, ankles and legs every day begin a heart failure diary bring diary to all appointments      Valente David, RN, MSN [place] [phone]

## 2021-10-19 DIAGNOSIS — I69354 Hemiplegia and hemiparesis following cerebral infarction affecting left non-dominant side: Secondary | ICD-10-CM | POA: Diagnosis not present

## 2021-10-19 DIAGNOSIS — I69328 Other speech and language deficits following cerebral infarction: Secondary | ICD-10-CM | POA: Diagnosis not present

## 2021-10-19 DIAGNOSIS — G35 Multiple sclerosis: Secondary | ICD-10-CM | POA: Diagnosis not present

## 2021-10-19 DIAGNOSIS — R1312 Dysphagia, oropharyngeal phase: Secondary | ICD-10-CM | POA: Diagnosis not present

## 2021-10-19 DIAGNOSIS — I69392 Facial weakness following cerebral infarction: Secondary | ICD-10-CM | POA: Diagnosis not present

## 2021-10-19 DIAGNOSIS — I69391 Dysphagia following cerebral infarction: Secondary | ICD-10-CM | POA: Diagnosis not present

## 2021-10-22 DIAGNOSIS — I69328 Other speech and language deficits following cerebral infarction: Secondary | ICD-10-CM | POA: Diagnosis not present

## 2021-10-22 DIAGNOSIS — I69354 Hemiplegia and hemiparesis following cerebral infarction affecting left non-dominant side: Secondary | ICD-10-CM | POA: Diagnosis not present

## 2021-10-22 DIAGNOSIS — I69392 Facial weakness following cerebral infarction: Secondary | ICD-10-CM | POA: Diagnosis not present

## 2021-10-22 DIAGNOSIS — R1312 Dysphagia, oropharyngeal phase: Secondary | ICD-10-CM | POA: Diagnosis not present

## 2021-10-22 DIAGNOSIS — G35 Multiple sclerosis: Secondary | ICD-10-CM | POA: Diagnosis not present

## 2021-10-22 DIAGNOSIS — I69391 Dysphagia following cerebral infarction: Secondary | ICD-10-CM | POA: Diagnosis not present

## 2021-10-23 DIAGNOSIS — I69391 Dysphagia following cerebral infarction: Secondary | ICD-10-CM | POA: Diagnosis not present

## 2021-10-23 DIAGNOSIS — R1312 Dysphagia, oropharyngeal phase: Secondary | ICD-10-CM | POA: Diagnosis not present

## 2021-10-23 DIAGNOSIS — I69328 Other speech and language deficits following cerebral infarction: Secondary | ICD-10-CM | POA: Diagnosis not present

## 2021-10-23 DIAGNOSIS — I69392 Facial weakness following cerebral infarction: Secondary | ICD-10-CM | POA: Diagnosis not present

## 2021-10-23 DIAGNOSIS — G35 Multiple sclerosis: Secondary | ICD-10-CM | POA: Diagnosis not present

## 2021-10-23 DIAGNOSIS — I69354 Hemiplegia and hemiparesis following cerebral infarction affecting left non-dominant side: Secondary | ICD-10-CM | POA: Diagnosis not present

## 2021-10-25 DIAGNOSIS — G35 Multiple sclerosis: Secondary | ICD-10-CM | POA: Diagnosis not present

## 2021-10-25 DIAGNOSIS — I69328 Other speech and language deficits following cerebral infarction: Secondary | ICD-10-CM | POA: Diagnosis not present

## 2021-10-25 DIAGNOSIS — I69354 Hemiplegia and hemiparesis following cerebral infarction affecting left non-dominant side: Secondary | ICD-10-CM | POA: Diagnosis not present

## 2021-10-25 DIAGNOSIS — I69391 Dysphagia following cerebral infarction: Secondary | ICD-10-CM | POA: Diagnosis not present

## 2021-10-25 DIAGNOSIS — I69392 Facial weakness following cerebral infarction: Secondary | ICD-10-CM | POA: Diagnosis not present

## 2021-10-25 DIAGNOSIS — R1312 Dysphagia, oropharyngeal phase: Secondary | ICD-10-CM | POA: Diagnosis not present

## 2021-10-29 DIAGNOSIS — I69391 Dysphagia following cerebral infarction: Secondary | ICD-10-CM | POA: Diagnosis not present

## 2021-10-29 DIAGNOSIS — G35 Multiple sclerosis: Secondary | ICD-10-CM | POA: Diagnosis not present

## 2021-10-29 DIAGNOSIS — R1312 Dysphagia, oropharyngeal phase: Secondary | ICD-10-CM | POA: Diagnosis not present

## 2021-10-29 DIAGNOSIS — I69328 Other speech and language deficits following cerebral infarction: Secondary | ICD-10-CM | POA: Diagnosis not present

## 2021-10-29 DIAGNOSIS — I69392 Facial weakness following cerebral infarction: Secondary | ICD-10-CM | POA: Diagnosis not present

## 2021-10-29 DIAGNOSIS — I69354 Hemiplegia and hemiparesis following cerebral infarction affecting left non-dominant side: Secondary | ICD-10-CM | POA: Diagnosis not present

## 2021-10-30 DIAGNOSIS — Z23 Encounter for immunization: Secondary | ICD-10-CM | POA: Diagnosis not present

## 2021-11-05 DIAGNOSIS — I69391 Dysphagia following cerebral infarction: Secondary | ICD-10-CM | POA: Diagnosis not present

## 2021-11-05 DIAGNOSIS — G35 Multiple sclerosis: Secondary | ICD-10-CM | POA: Diagnosis not present

## 2021-11-05 DIAGNOSIS — R1312 Dysphagia, oropharyngeal phase: Secondary | ICD-10-CM | POA: Diagnosis not present

## 2021-11-05 DIAGNOSIS — I69328 Other speech and language deficits following cerebral infarction: Secondary | ICD-10-CM | POA: Diagnosis not present

## 2021-11-05 DIAGNOSIS — I69354 Hemiplegia and hemiparesis following cerebral infarction affecting left non-dominant side: Secondary | ICD-10-CM | POA: Diagnosis not present

## 2021-11-05 DIAGNOSIS — I69392 Facial weakness following cerebral infarction: Secondary | ICD-10-CM | POA: Diagnosis not present

## 2021-11-08 DIAGNOSIS — I69391 Dysphagia following cerebral infarction: Secondary | ICD-10-CM | POA: Diagnosis not present

## 2021-11-08 DIAGNOSIS — I69392 Facial weakness following cerebral infarction: Secondary | ICD-10-CM | POA: Diagnosis not present

## 2021-11-08 DIAGNOSIS — G35 Multiple sclerosis: Secondary | ICD-10-CM | POA: Diagnosis not present

## 2021-11-08 DIAGNOSIS — I69328 Other speech and language deficits following cerebral infarction: Secondary | ICD-10-CM | POA: Diagnosis not present

## 2021-11-08 DIAGNOSIS — R1312 Dysphagia, oropharyngeal phase: Secondary | ICD-10-CM | POA: Diagnosis not present

## 2021-11-08 DIAGNOSIS — I69354 Hemiplegia and hemiparesis following cerebral infarction affecting left non-dominant side: Secondary | ICD-10-CM | POA: Diagnosis not present

## 2021-11-09 ENCOUNTER — Ambulatory Visit (INDEPENDENT_AMBULATORY_CARE_PROVIDER_SITE_OTHER): Payer: Medicare Other | Admitting: Podiatry

## 2021-11-09 ENCOUNTER — Other Ambulatory Visit: Payer: Self-pay

## 2021-11-09 ENCOUNTER — Encounter: Payer: Self-pay | Admitting: Podiatry

## 2021-11-09 DIAGNOSIS — M0579 Rheumatoid arthritis with rheumatoid factor of multiple sites without organ or systems involvement: Secondary | ICD-10-CM

## 2021-11-09 DIAGNOSIS — I639 Cerebral infarction, unspecified: Secondary | ICD-10-CM

## 2021-11-09 DIAGNOSIS — M79674 Pain in right toe(s): Secondary | ICD-10-CM

## 2021-11-09 DIAGNOSIS — I69328 Other speech and language deficits following cerebral infarction: Secondary | ICD-10-CM | POA: Diagnosis not present

## 2021-11-09 DIAGNOSIS — B351 Tinea unguium: Secondary | ICD-10-CM

## 2021-11-09 DIAGNOSIS — M79675 Pain in left toe(s): Secondary | ICD-10-CM

## 2021-11-09 DIAGNOSIS — I69354 Hemiplegia and hemiparesis following cerebral infarction affecting left non-dominant side: Secondary | ICD-10-CM | POA: Diagnosis not present

## 2021-11-09 DIAGNOSIS — R1312 Dysphagia, oropharyngeal phase: Secondary | ICD-10-CM | POA: Diagnosis not present

## 2021-11-09 DIAGNOSIS — I69392 Facial weakness following cerebral infarction: Secondary | ICD-10-CM | POA: Diagnosis not present

## 2021-11-09 DIAGNOSIS — I69391 Dysphagia following cerebral infarction: Secondary | ICD-10-CM | POA: Diagnosis not present

## 2021-11-09 DIAGNOSIS — G35 Multiple sclerosis: Secondary | ICD-10-CM

## 2021-11-09 NOTE — Progress Notes (Signed)
This patient returns to my office for at risk foot care.  This patient requires this care by a professional since this patient will be at risk due to having CVA,  MS  and RA.  This patient is unable to cut nails herself since the patient cannot reach hernails.These nails are painful walking and wearing shoes.  This patient presents for at risk foot care today. She presents to the office in a wheelchair with female caregiver.  General Appearance  Alert, conversant and in no acute stress.  Vascular  Dorsalis pedis and posterior tibial  pulses are  weaklypalpable  bilaterally.  Capillary return is within normal limits  bilaterally. Temperature is within normal limits  bilaterally.  Neurologic  Senn-Weinstein monofilament wire test within normal limits  bilaterally. Muscle power within normal limits bilaterally.  Nails Thick disfigured discolored nails with subungual debris  from hallux to fifth toes bilaterally. No evidence of bacterial infection or drainage bilaterally. Nail polish on all nails.  Orthopedic  No limitations of motion  feet .  No crepitus or effusions noted.  No bony pathology or digital deformities noted. Hammer toes  B/L.  Skin  normotropic skin with no porokeratosis noted bilaterally.  No signs of infections or ulcers noted.     Onychomycosis  Pain in right toes  Pain in left toes  Consent was obtained for treatment procedures.   Mechanical debridement of nails 1-5  bilaterally performed with a nail nipper.  Filed with dremel without incident.    Return office visit      prn               Told patient to return for periodic foot care and evaluation due to potential at risk complications.   Gardiner Barefoot DPM

## 2021-11-11 DIAGNOSIS — I5032 Chronic diastolic (congestive) heart failure: Secondary | ICD-10-CM | POA: Diagnosis not present

## 2021-11-11 DIAGNOSIS — G35 Multiple sclerosis: Secondary | ICD-10-CM | POA: Diagnosis not present

## 2021-11-11 DIAGNOSIS — Z7902 Long term (current) use of antithrombotics/antiplatelets: Secondary | ICD-10-CM | POA: Diagnosis not present

## 2021-11-11 DIAGNOSIS — M1990 Unspecified osteoarthritis, unspecified site: Secondary | ICD-10-CM | POA: Diagnosis not present

## 2021-11-11 DIAGNOSIS — M316 Other giant cell arteritis: Secondary | ICD-10-CM | POA: Diagnosis not present

## 2021-11-11 DIAGNOSIS — E039 Hypothyroidism, unspecified: Secondary | ICD-10-CM | POA: Diagnosis not present

## 2021-11-11 DIAGNOSIS — M8580 Other specified disorders of bone density and structure, unspecified site: Secondary | ICD-10-CM | POA: Diagnosis not present

## 2021-11-11 DIAGNOSIS — R1312 Dysphagia, oropharyngeal phase: Secondary | ICD-10-CM | POA: Diagnosis not present

## 2021-11-11 DIAGNOSIS — I69392 Facial weakness following cerebral infarction: Secondary | ICD-10-CM | POA: Diagnosis not present

## 2021-11-11 DIAGNOSIS — M21371 Foot drop, right foot: Secondary | ICD-10-CM | POA: Diagnosis not present

## 2021-11-11 DIAGNOSIS — M0600 Rheumatoid arthritis without rheumatoid factor, unspecified site: Secondary | ICD-10-CM | POA: Diagnosis not present

## 2021-11-11 DIAGNOSIS — I69391 Dysphagia following cerebral infarction: Secondary | ICD-10-CM | POA: Diagnosis not present

## 2021-11-11 DIAGNOSIS — Z90710 Acquired absence of both cervix and uterus: Secondary | ICD-10-CM | POA: Diagnosis not present

## 2021-11-11 DIAGNOSIS — I69354 Hemiplegia and hemiparesis following cerebral infarction affecting left non-dominant side: Secondary | ICD-10-CM | POA: Diagnosis not present

## 2021-11-11 DIAGNOSIS — I2510 Atherosclerotic heart disease of native coronary artery without angina pectoris: Secondary | ICD-10-CM | POA: Diagnosis not present

## 2021-11-11 DIAGNOSIS — Z9181 History of falling: Secondary | ICD-10-CM | POA: Diagnosis not present

## 2021-11-11 DIAGNOSIS — E7800 Pure hypercholesterolemia, unspecified: Secondary | ICD-10-CM | POA: Diagnosis not present

## 2021-11-11 DIAGNOSIS — I872 Venous insufficiency (chronic) (peripheral): Secondary | ICD-10-CM | POA: Diagnosis not present

## 2021-11-11 DIAGNOSIS — I110 Hypertensive heart disease with heart failure: Secondary | ICD-10-CM | POA: Diagnosis not present

## 2021-11-11 DIAGNOSIS — M80051S Age-related osteoporosis with current pathological fracture, right femur, sequela: Secondary | ICD-10-CM | POA: Diagnosis not present

## 2021-11-11 DIAGNOSIS — E559 Vitamin D deficiency, unspecified: Secondary | ICD-10-CM | POA: Diagnosis not present

## 2021-11-11 DIAGNOSIS — I69328 Other speech and language deficits following cerebral infarction: Secondary | ICD-10-CM | POA: Diagnosis not present

## 2021-11-11 DIAGNOSIS — Z8781 Personal history of (healed) traumatic fracture: Secondary | ICD-10-CM | POA: Diagnosis not present

## 2021-11-11 DIAGNOSIS — M87251 Osteonecrosis due to previous trauma, right femur: Secondary | ICD-10-CM | POA: Diagnosis not present

## 2021-11-11 DIAGNOSIS — R32 Unspecified urinary incontinence: Secondary | ICD-10-CM | POA: Diagnosis not present

## 2021-11-12 DIAGNOSIS — I69392 Facial weakness following cerebral infarction: Secondary | ICD-10-CM | POA: Diagnosis not present

## 2021-11-12 DIAGNOSIS — G35 Multiple sclerosis: Secondary | ICD-10-CM | POA: Diagnosis not present

## 2021-11-12 DIAGNOSIS — I69391 Dysphagia following cerebral infarction: Secondary | ICD-10-CM | POA: Diagnosis not present

## 2021-11-12 DIAGNOSIS — I69328 Other speech and language deficits following cerebral infarction: Secondary | ICD-10-CM | POA: Diagnosis not present

## 2021-11-12 DIAGNOSIS — R1312 Dysphagia, oropharyngeal phase: Secondary | ICD-10-CM | POA: Diagnosis not present

## 2021-11-12 DIAGNOSIS — I69354 Hemiplegia and hemiparesis following cerebral infarction affecting left non-dominant side: Secondary | ICD-10-CM | POA: Diagnosis not present

## 2021-11-14 ENCOUNTER — Other Ambulatory Visit: Payer: Self-pay | Admitting: *Deleted

## 2021-11-14 DIAGNOSIS — G35 Multiple sclerosis: Secondary | ICD-10-CM | POA: Diagnosis not present

## 2021-11-14 DIAGNOSIS — I69354 Hemiplegia and hemiparesis following cerebral infarction affecting left non-dominant side: Secondary | ICD-10-CM | POA: Diagnosis not present

## 2021-11-14 DIAGNOSIS — I69391 Dysphagia following cerebral infarction: Secondary | ICD-10-CM | POA: Diagnosis not present

## 2021-11-14 DIAGNOSIS — I69392 Facial weakness following cerebral infarction: Secondary | ICD-10-CM | POA: Diagnosis not present

## 2021-11-14 DIAGNOSIS — R1312 Dysphagia, oropharyngeal phase: Secondary | ICD-10-CM | POA: Diagnosis not present

## 2021-11-14 DIAGNOSIS — I69328 Other speech and language deficits following cerebral infarction: Secondary | ICD-10-CM | POA: Diagnosis not present

## 2021-11-14 NOTE — Patient Outreach (Signed)
[facility]) Care Management  [place]  11/14/2021   [name] June 28, 1932 [id number]   Outgoing call placed to member, successful.  Denies any urgent concerns, encouraged to contact this care manager with questions.    Encounter Medications:  Outpatient Encounter Medications as of 11/14/2021  Medication Sig Note   acetaminophen (TYLENOL) 325 MG tablet Take 2 tablets (650 mg total) by mouth every 6 (six) hours as needed for mild pain (or temp > 37.5 C (99.5 F)).    aspirin EC 81 MG EC tablet Take 1 tablet (81 mg total) by mouth daily. Swallow whole.    atorvastatin (LIPITOR) 80 MG tablet Take 1 tablet (80 mg total) by mouth daily.    baclofen (LIORESAL) 10 MG tablet TAKE ONE TABLET IN THE MORNING, 1 TAB AROUND 3 PM AND 2 TABLETS AT BEDTIME    Biotin 10000 MCG TABS Take 1,000 mcg by mouth daily.    Calcium Carb-Cholecalciferol (CALCIUM-VITAMIN D3) 250-125 MG-UNIT TABS Take 1 tablet by mouth 2 (two) times daily.    Calcium Carbonate-Vitamin D 600-200 MG-UNIT CAPS Take 1 capsule by mouth daily.    Carboxymethylcellul-Glycerin (LUBRICATING EYE DROPS OP) Place 1 drop into both eyes 2 (two) times daily. For Eye Dryness    diclofenac Sodium (VOLTAREN) 1 % GEL APPLY 2 GMS TOPICALLY TO AFFECTED AREAS (RIGHT HIP) TWICE DAILY AS NEEDED FOR PAIN    food thickener (SIMPLYTHICK, NECTAR/LEVEL 2/MILDLY THICK,) GEL Please use with all fluids for nectar thick consistency    levothyroxine (SYNTHROID) 25 MCG tablet Take 1 tablet (25 mcg total) by mouth daily.    Magnesium 250 MG TABS Take 1 tablet by mouth daily.    Multiple Vitamins-Minerals (ICAPS AREDS FORMULA PO) Take 1 capsule by mouth daily.     nabumetone (RELAFEN) 500 MG tablet TAKE (1) TABLET TWICE A DAY AS NEEDED.    pantoprazole (PROTONIX) 40 MG tablet Take 1 tablet (40 mg total) by mouth daily.    Vitamin D, Ergocalciferol, (DRISDOL) 1.25 MG (50000 UNIT) CAPS capsule Take 50,000 Units by mouth every 7  (seven) days. 06/07/2021: Take on Tuesdays    vitamin E 45 MG (100 UNITS) capsule Take 100 Units by mouth daily.    [DISCONTINUED] Abatacept (ORENCIA) 125 MG/ML SOSY 1 ml    [DISCONTINUED] Azelastine HCl 137 MCG/SPRAY SOLN Place 1 spray into both nostrils 2 (two) times daily as needed (As needed for allergies).    [DISCONTINUED] leflunomide (ARAVA) 10 MG tablet     No facility-administered encounter medications on file as of 11/14/2021.    Functional Status:  In your present state of health, do you have any difficulty performing the following activities: 06/08/2021  Hearing? N  Vision? N  Difficulty concentrating or making decisions? N  Walking or climbing stairs? Y  Dressing or bathing? N  Doing errands, shopping? N  Some recent data might be hidden    Fall/Depression Screening: Fall Risk  06/26/2021 04/23/2020  Falls in the past year? 0 -  Number falls in past yr: 0 -  Injury with Fall? 0 -  Risk for fall due to : Impaired mobility History of fall(s);Impaired balance/gait;Impaired mobility;Medication side effect;Orthopedic patient  Follow up Falls evaluation completed;Education provided Falls evaluation completed;Education provided;Falls prevention discussed  Comment - at [place] 2/9 Scores 06/26/2021  PHQ - 2 Score 1  Exception Documentation (No Data)    Assessment:   Care Plan Care Plan : [facility] Plan of Care (Adult)  Updates  made by Valente David, RN since 11/14/2021 12:00 AM     Problem: Difficulty with self care related to recent stroke and CHF   Priority: High     Long-Range Goal: Improved independence with management of self post stroke and for CHF   Start Date: 10/17/2021  Expected End Date: 01/15/2022  This Visit's Progress: On track  Priority: High  Note:   Current Barriers:  Chronic Disease Management support and education needs related to CHF and Stroke Transportation barriers  RNCM Clinical Goal(s):  Patient will verbalize understanding of plan for  management of CHF and Stroke as evidenced by ability to self manage with minimal help from caregivers take all medications exactly as prescribed and will call provider for medication related questions as evidenced by reported medication compliance    attend all scheduled medical appointments: neurology in January as evidenced by attending provider appointment        work with community resource care guide to address needs related to Transportation and ADL IADL limitations as evidenced by patient and/or community resource care guide support    work with [facility] and DME company to continue gaining strength and mobility as evidenced by obtaining power chair and ability to transfer from chair/bed/BSC  through collaboration with Consulting civil engineer, provider, and care team.   Interventions:  Inter-disciplinary care team collaboration (see longitudinal plan of care) Evaluation of current treatment plan related to  self management and patient's adherence to plan as established by provider   Heart Failure Interventions:  (Status: Goal on Track (progressing): YES.)  Long Term Goal  Provided education on low sodium diet Reviewed role of diuretics in prevention of fluid overload and management of heart failure Discussed the importance of keeping all appointments with provider  Stroke  (Status: Goal on Track (progressing): YES.) Long Term Goal  Evaluation of current treatment plan related to  Stroke , ADL IADL limitations self-management and patient's adherence to plan as established by provider. Discussed plans with patient for ongoing care management follow up and provided patient with direct contact information for care management team Advised patient to continue [facility] PT/OT to increase strength and follow up with DME company for power chair order; Discussed plans with patient for ongoing care management follow up and provided patient with direct contact information for care management team;  Patient  Goals/Self-Care Activities: Patient will self administer medications as prescribed as evidenced by self report/primary caregiver report  Patient will attend all scheduled provider appointments as evidenced by clinician review of documented attendance to scheduled appointments and patient/caregiver report Patient will continue to perform ADL's independently as evidenced by patient/caregiver report keep legs up while sitting use salt in moderation watch for swelling in feet, ankles and legs every day begin a heart failure diary bring diary to all appointments     Update 12/7 - Member report she is "fine."  She continues to work with home health for PT, OT, and ST.  She has received her power chair however she will need to learn additional tactics for maneuvering it as she state the foot rests are longer than her old one and unable to fit in some spaces.  She is also not happy with the headrest, prohibits her from turning her head fully.  She will discuss these concerns with PT/OT for options.  She and home aides are still monitoring her blood pressure at least 3 days a week, all reported stable, unable to stand for daily weights.  Will follow up with neurologist  for her MS in January and with PCP in March.        Plan:  Follow-up: Patient agrees to Care Plan and Follow-up. Follow-up in 1 month(s)  Valente David, RN, MSN [place] [phone]

## 2021-11-16 DIAGNOSIS — G35 Multiple sclerosis: Secondary | ICD-10-CM | POA: Diagnosis not present

## 2021-11-16 DIAGNOSIS — I69391 Dysphagia following cerebral infarction: Secondary | ICD-10-CM | POA: Diagnosis not present

## 2021-11-16 DIAGNOSIS — I69354 Hemiplegia and hemiparesis following cerebral infarction affecting left non-dominant side: Secondary | ICD-10-CM | POA: Diagnosis not present

## 2021-11-16 DIAGNOSIS — I69328 Other speech and language deficits following cerebral infarction: Secondary | ICD-10-CM | POA: Diagnosis not present

## 2021-11-16 DIAGNOSIS — I69392 Facial weakness following cerebral infarction: Secondary | ICD-10-CM | POA: Diagnosis not present

## 2021-11-16 DIAGNOSIS — R1312 Dysphagia, oropharyngeal phase: Secondary | ICD-10-CM | POA: Diagnosis not present

## 2021-11-19 DIAGNOSIS — I69328 Other speech and language deficits following cerebral infarction: Secondary | ICD-10-CM | POA: Diagnosis not present

## 2021-11-19 DIAGNOSIS — I69392 Facial weakness following cerebral infarction: Secondary | ICD-10-CM | POA: Diagnosis not present

## 2021-11-19 DIAGNOSIS — R1312 Dysphagia, oropharyngeal phase: Secondary | ICD-10-CM | POA: Diagnosis not present

## 2021-11-19 DIAGNOSIS — G35 Multiple sclerosis: Secondary | ICD-10-CM | POA: Diagnosis not present

## 2021-11-19 DIAGNOSIS — I69354 Hemiplegia and hemiparesis following cerebral infarction affecting left non-dominant side: Secondary | ICD-10-CM | POA: Diagnosis not present

## 2021-11-19 DIAGNOSIS — I69391 Dysphagia following cerebral infarction: Secondary | ICD-10-CM | POA: Diagnosis not present

## 2021-11-21 DIAGNOSIS — I69391 Dysphagia following cerebral infarction: Secondary | ICD-10-CM | POA: Diagnosis not present

## 2021-11-21 DIAGNOSIS — I69328 Other speech and language deficits following cerebral infarction: Secondary | ICD-10-CM | POA: Diagnosis not present

## 2021-11-21 DIAGNOSIS — R1312 Dysphagia, oropharyngeal phase: Secondary | ICD-10-CM | POA: Diagnosis not present

## 2021-11-21 DIAGNOSIS — G35 Multiple sclerosis: Secondary | ICD-10-CM | POA: Diagnosis not present

## 2021-11-21 DIAGNOSIS — I69392 Facial weakness following cerebral infarction: Secondary | ICD-10-CM | POA: Diagnosis not present

## 2021-11-21 DIAGNOSIS — I69354 Hemiplegia and hemiparesis following cerebral infarction affecting left non-dominant side: Secondary | ICD-10-CM | POA: Diagnosis not present

## 2021-11-24 ENCOUNTER — Other Ambulatory Visit: Payer: Self-pay | Admitting: Neurology

## 2021-11-27 DIAGNOSIS — I69328 Other speech and language deficits following cerebral infarction: Secondary | ICD-10-CM | POA: Diagnosis not present

## 2021-11-27 DIAGNOSIS — I69391 Dysphagia following cerebral infarction: Secondary | ICD-10-CM | POA: Diagnosis not present

## 2021-11-27 DIAGNOSIS — R1312 Dysphagia, oropharyngeal phase: Secondary | ICD-10-CM | POA: Diagnosis not present

## 2021-11-27 DIAGNOSIS — G35 Multiple sclerosis: Secondary | ICD-10-CM | POA: Diagnosis not present

## 2021-11-27 DIAGNOSIS — I69392 Facial weakness following cerebral infarction: Secondary | ICD-10-CM | POA: Diagnosis not present

## 2021-11-27 DIAGNOSIS — I69354 Hemiplegia and hemiparesis following cerebral infarction affecting left non-dominant side: Secondary | ICD-10-CM | POA: Diagnosis not present

## 2021-11-28 DIAGNOSIS — I69391 Dysphagia following cerebral infarction: Secondary | ICD-10-CM | POA: Diagnosis not present

## 2021-11-28 DIAGNOSIS — R1312 Dysphagia, oropharyngeal phase: Secondary | ICD-10-CM | POA: Diagnosis not present

## 2021-11-28 DIAGNOSIS — I69328 Other speech and language deficits following cerebral infarction: Secondary | ICD-10-CM | POA: Diagnosis not present

## 2021-11-28 DIAGNOSIS — I69354 Hemiplegia and hemiparesis following cerebral infarction affecting left non-dominant side: Secondary | ICD-10-CM | POA: Diagnosis not present

## 2021-11-28 DIAGNOSIS — I69392 Facial weakness following cerebral infarction: Secondary | ICD-10-CM | POA: Diagnosis not present

## 2021-11-28 DIAGNOSIS — G35 Multiple sclerosis: Secondary | ICD-10-CM | POA: Diagnosis not present

## 2021-12-04 DIAGNOSIS — I69354 Hemiplegia and hemiparesis following cerebral infarction affecting left non-dominant side: Secondary | ICD-10-CM | POA: Diagnosis not present

## 2021-12-04 DIAGNOSIS — I69392 Facial weakness following cerebral infarction: Secondary | ICD-10-CM | POA: Diagnosis not present

## 2021-12-04 DIAGNOSIS — I69391 Dysphagia following cerebral infarction: Secondary | ICD-10-CM | POA: Diagnosis not present

## 2021-12-04 DIAGNOSIS — R1312 Dysphagia, oropharyngeal phase: Secondary | ICD-10-CM | POA: Diagnosis not present

## 2021-12-04 DIAGNOSIS — G35 Multiple sclerosis: Secondary | ICD-10-CM | POA: Diagnosis not present

## 2021-12-04 DIAGNOSIS — I69328 Other speech and language deficits following cerebral infarction: Secondary | ICD-10-CM | POA: Diagnosis not present

## 2021-12-06 DIAGNOSIS — I69354 Hemiplegia and hemiparesis following cerebral infarction affecting left non-dominant side: Secondary | ICD-10-CM | POA: Diagnosis not present

## 2021-12-06 DIAGNOSIS — G35 Multiple sclerosis: Secondary | ICD-10-CM | POA: Diagnosis not present

## 2021-12-06 DIAGNOSIS — R1312 Dysphagia, oropharyngeal phase: Secondary | ICD-10-CM | POA: Diagnosis not present

## 2021-12-06 DIAGNOSIS — I69328 Other speech and language deficits following cerebral infarction: Secondary | ICD-10-CM | POA: Diagnosis not present

## 2021-12-06 DIAGNOSIS — I69391 Dysphagia following cerebral infarction: Secondary | ICD-10-CM | POA: Diagnosis not present

## 2021-12-06 DIAGNOSIS — I69392 Facial weakness following cerebral infarction: Secondary | ICD-10-CM | POA: Diagnosis not present

## 2021-12-10 DIAGNOSIS — I69392 Facial weakness following cerebral infarction: Secondary | ICD-10-CM | POA: Diagnosis not present

## 2021-12-10 DIAGNOSIS — I69354 Hemiplegia and hemiparesis following cerebral infarction affecting left non-dominant side: Secondary | ICD-10-CM | POA: Diagnosis not present

## 2021-12-10 DIAGNOSIS — I69391 Dysphagia following cerebral infarction: Secondary | ICD-10-CM | POA: Diagnosis not present

## 2021-12-10 DIAGNOSIS — R1312 Dysphagia, oropharyngeal phase: Secondary | ICD-10-CM | POA: Diagnosis not present

## 2021-12-10 DIAGNOSIS — G35 Multiple sclerosis: Secondary | ICD-10-CM | POA: Diagnosis not present

## 2021-12-10 DIAGNOSIS — I69328 Other speech and language deficits following cerebral infarction: Secondary | ICD-10-CM | POA: Diagnosis not present

## 2021-12-11 DIAGNOSIS — E559 Vitamin D deficiency, unspecified: Secondary | ICD-10-CM | POA: Diagnosis not present

## 2021-12-11 DIAGNOSIS — M21371 Foot drop, right foot: Secondary | ICD-10-CM | POA: Diagnosis not present

## 2021-12-11 DIAGNOSIS — I69328 Other speech and language deficits following cerebral infarction: Secondary | ICD-10-CM | POA: Diagnosis not present

## 2021-12-11 DIAGNOSIS — I739 Peripheral vascular disease, unspecified: Secondary | ICD-10-CM | POA: Diagnosis not present

## 2021-12-11 DIAGNOSIS — I872 Venous insufficiency (chronic) (peripheral): Secondary | ICD-10-CM | POA: Diagnosis not present

## 2021-12-11 DIAGNOSIS — E039 Hypothyroidism, unspecified: Secondary | ICD-10-CM | POA: Diagnosis not present

## 2021-12-11 DIAGNOSIS — I110 Hypertensive heart disease with heart failure: Secondary | ICD-10-CM | POA: Diagnosis not present

## 2021-12-11 DIAGNOSIS — G35 Multiple sclerosis: Secondary | ICD-10-CM | POA: Diagnosis not present

## 2021-12-11 DIAGNOSIS — Z9181 History of falling: Secondary | ICD-10-CM | POA: Diagnosis not present

## 2021-12-11 DIAGNOSIS — M316 Other giant cell arteritis: Secondary | ICD-10-CM | POA: Diagnosis not present

## 2021-12-11 DIAGNOSIS — M87251 Osteonecrosis due to previous trauma, right femur: Secondary | ICD-10-CM | POA: Diagnosis not present

## 2021-12-11 DIAGNOSIS — M0600 Rheumatoid arthritis without rheumatoid factor, unspecified site: Secondary | ICD-10-CM | POA: Diagnosis not present

## 2021-12-11 DIAGNOSIS — R32 Unspecified urinary incontinence: Secondary | ICD-10-CM | POA: Diagnosis not present

## 2021-12-11 DIAGNOSIS — M8580 Other specified disorders of bone density and structure, unspecified site: Secondary | ICD-10-CM | POA: Diagnosis not present

## 2021-12-11 DIAGNOSIS — M1990 Unspecified osteoarthritis, unspecified site: Secondary | ICD-10-CM | POA: Diagnosis not present

## 2021-12-11 DIAGNOSIS — Z7982 Long term (current) use of aspirin: Secondary | ICD-10-CM | POA: Diagnosis not present

## 2021-12-11 DIAGNOSIS — E7800 Pure hypercholesterolemia, unspecified: Secondary | ICD-10-CM | POA: Diagnosis not present

## 2021-12-11 DIAGNOSIS — I5032 Chronic diastolic (congestive) heart failure: Secondary | ICD-10-CM | POA: Diagnosis not present

## 2021-12-11 DIAGNOSIS — Z7902 Long term (current) use of antithrombotics/antiplatelets: Secondary | ICD-10-CM | POA: Diagnosis not present

## 2021-12-11 DIAGNOSIS — Z90710 Acquired absence of both cervix and uterus: Secondary | ICD-10-CM | POA: Diagnosis not present

## 2021-12-11 DIAGNOSIS — I69392 Facial weakness following cerebral infarction: Secondary | ICD-10-CM | POA: Diagnosis not present

## 2021-12-11 DIAGNOSIS — Z8781 Personal history of (healed) traumatic fracture: Secondary | ICD-10-CM | POA: Diagnosis not present

## 2021-12-11 DIAGNOSIS — I69354 Hemiplegia and hemiparesis following cerebral infarction affecting left non-dominant side: Secondary | ICD-10-CM | POA: Diagnosis not present

## 2021-12-11 DIAGNOSIS — I2510 Atherosclerotic heart disease of native coronary artery without angina pectoris: Secondary | ICD-10-CM | POA: Diagnosis not present

## 2021-12-11 DIAGNOSIS — M80051S Age-related osteoporosis with current pathological fracture, right femur, sequela: Secondary | ICD-10-CM | POA: Diagnosis not present

## 2021-12-12 ENCOUNTER — Other Ambulatory Visit: Payer: Self-pay | Admitting: *Deleted

## 2021-12-12 DIAGNOSIS — I69328 Other speech and language deficits following cerebral infarction: Secondary | ICD-10-CM | POA: Diagnosis not present

## 2021-12-12 DIAGNOSIS — I69354 Hemiplegia and hemiparesis following cerebral infarction affecting left non-dominant side: Secondary | ICD-10-CM | POA: Diagnosis not present

## 2021-12-12 DIAGNOSIS — G35 Multiple sclerosis: Secondary | ICD-10-CM | POA: Diagnosis not present

## 2021-12-12 DIAGNOSIS — I110 Hypertensive heart disease with heart failure: Secondary | ICD-10-CM | POA: Diagnosis not present

## 2021-12-12 DIAGNOSIS — I69392 Facial weakness following cerebral infarction: Secondary | ICD-10-CM | POA: Diagnosis not present

## 2021-12-12 DIAGNOSIS — I5032 Chronic diastolic (congestive) heart failure: Secondary | ICD-10-CM | POA: Diagnosis not present

## 2021-12-12 NOTE — Patient Outreach (Signed)
[facility]) Care Management  [place]  12/12/2021   [name] May 19, 1932 [id number]   Outgoing call placed to member, successful.  Denies any urgent concerns, encouraged to contact this care manager with questions.     Encounter Medications:  Outpatient Encounter Medications as of 12/12/2021  Medication Sig Note   acetaminophen (TYLENOL) 325 MG tablet Take 2 tablets (650 mg total) by mouth every 6 (six) hours as needed for mild pain (or temp > 37.5 C (99.5 F)).    aspirin EC 81 MG EC tablet Take 1 tablet (81 mg total) by mouth daily. Swallow whole.    atorvastatin (LIPITOR) 80 MG tablet Take 1 tablet (80 mg total) by mouth daily.    baclofen (LIORESAL) 10 MG tablet TAKE ONE TABLET IN THE MORNING, 1 TAB AROUND 3 PM AND 2 TABLETS AT BEDTIME    Biotin 10000 MCG TABS Take 1,000 mcg by mouth daily.    Calcium Carb-Cholecalciferol (CALCIUM-VITAMIN D3) 250-125 MG-UNIT TABS Take 1 tablet by mouth 2 (two) times daily.    Calcium Carbonate-Vitamin D 600-200 MG-UNIT CAPS Take 1 capsule by mouth daily.    Carboxymethylcellul-Glycerin (LUBRICATING EYE DROPS OP) Place 1 drop into both eyes 2 (two) times daily. For Eye Dryness    diclofenac Sodium (VOLTAREN) 1 % GEL APPLY 2 GMS TOPICALLY TO AFFECTED AREAS (RIGHT HIP) TWICE DAILY AS NEEDED FOR PAIN    food thickener (SIMPLYTHICK, NECTAR/LEVEL 2/MILDLY THICK,) GEL Please use with all fluids for nectar thick consistency    levothyroxine (SYNTHROID) 25 MCG tablet Take 1 tablet (25 mcg total) by mouth daily.    Magnesium 250 MG TABS Take 1 tablet by mouth daily.    Multiple Vitamins-Minerals (ICAPS AREDS FORMULA PO) Take 1 capsule by mouth daily.     nabumetone (RELAFEN) 500 MG tablet TAKE ONE TABLET TWICE DAILY AS NEEDED    pantoprazole (PROTONIX) 40 MG tablet Take 1 tablet (40 mg total) by mouth daily.    Vitamin D, Ergocalciferol, (DRISDOL) 1.25 MG (50000 UNIT) CAPS capsule Take 50,000 Units by mouth every 7  (seven) days. 06/07/2021: Take on Tuesdays    vitamin E 45 MG (100 UNITS) capsule Take 100 Units by mouth daily.    [DISCONTINUED] Abatacept (ORENCIA) 125 MG/ML SOSY 1 ml    [DISCONTINUED] Azelastine HCl 137 MCG/SPRAY SOLN Place 1 spray into both nostrils 2 (two) times daily as needed (As needed for allergies).    [DISCONTINUED] leflunomide (ARAVA) 10 MG tablet     No facility-administered encounter medications on file as of 12/12/2021.    Functional Status:  In your present state of health, do you have any difficulty performing the following activities: 06/08/2021  Hearing? N  Vision? N  Difficulty concentrating or making decisions? N  Walking or climbing stairs? Y  Dressing or bathing? N  Doing errands, shopping? N  Some recent data might be hidden    Fall/Depression Screening: Fall Risk  06/26/2021 04/23/2020  Falls in the past year? 0 -  Number falls in past yr: 0 -  Injury with Fall? 0 -  Risk for fall due to : Impaired mobility History of fall(s);Impaired balance/gait;Impaired mobility;Medication side effect;Orthopedic patient  Follow up Falls evaluation completed;Education provided Falls evaluation completed;Education provided;Falls prevention discussed  Comment - at [place] 2/9 Scores 06/26/2021  PHQ - 2 Score 1  Exception Documentation (No Data)    Assessment:   Care Plan Care Plan : [facility] Plan of Care (Adult)  Updates  made by Valente David, RN since 12/12/2021 12:00 AM     Problem: Difficulty with self care related to recent stroke and CHF   Priority: High     Long-Range Goal: Improved independence with management of self post stroke and for CHF   Start Date: 10/17/2021  Expected End Date: 04/16/2022  Recent Progress: On track  Priority: High  Note:   Current Barriers:  Chronic Disease Management support and education needs related to CHF and Stroke Transportation barriers  RNCM Clinical Goal(s):  Patient will verbalize understanding of plan for management  of CHF and Stroke as evidenced by ability to self manage with minimal help from caregivers take all medications exactly as prescribed and will call provider for medication related questions as evidenced by reported medication compliance    attend all scheduled medical appointments: neurology in January as evidenced by attending provider appointment        work with community resource care guide to address needs related to Transportation and ADL IADL limitations as evidenced by patient and/or community resource care guide support    work with [facility] and DME company to continue gaining strength and mobility as evidenced by obtaining power chair and ability to transfer from chair/bed/BSC  through collaboration with Consulting civil engineer, provider, and care team.   Interventions:  Inter-disciplinary care team collaboration (see longitudinal plan of care) Evaluation of current treatment plan related to  self management and patient's adherence to plan as established by provider   Heart Failure Interventions:  (Status: Goal on Track (progressing): YES.)  Long Term Goal  Provided education on low sodium diet Reviewed role of diuretics in prevention of fluid overload and management of heart failure Discussed the importance of keeping all appointments with provider  Stroke  (Status: Goal on Track (progressing): YES.) Long Term Goal  Evaluation of current treatment plan related to  Stroke , ADL IADL limitations self-management and patient's adherence to plan as established by provider. Discussed plans with patient for ongoing care management follow up and provided patient with direct contact information for care management team Advised patient to continue [facility] PT/OT to increase strength and follow up with DME company for power chair order; Discussed plans with patient for ongoing care management follow up and provided patient with direct contact information for care management team;  Patient Goals/Self-Care  Activities: Patient will self administer medications as prescribed as evidenced by self report/primary caregiver report  Patient will attend all scheduled provider appointments as evidenced by clinician review of documented attendance to scheduled appointments and patient/caregiver report Patient will continue to perform ADL's independently as evidenced by patient/caregiver report keep legs up while sitting use salt in moderation watch for swelling in feet, ankles and legs every day begin a heart failure diary bring diary to all appointments     Update 12/7 - Member report she is "fine."  She continues to work with home health for PT, OT, and ST.  She has received her power chair however she will need to learn additional tactics for maneuvering it as she state the foot rests are longer than her old one and unable to fit in some spaces.  She is also not happy with the headrest, prohibits her from turning her head fully.  She will discuss these concerns with PT/OT for options.  She and home aides are still monitoring her blood pressure at least 3 days a week, all reported stable, unable to stand for daily weights.  Will follow up with neurologist for  her MS in January and with PCP in March.   Update 1/4 - Member state she has been doing well, still active with home health team.  Report they have re-certified her for another month.  No further issues noted with her power chair, vitals stable.            Plan:  Follow-up: Patient agrees to Care Plan and Follow-up. Follow-up in 1 month(s).  Valente David, RN, MSN, [place] Manager [phone]

## 2021-12-14 DIAGNOSIS — I69392 Facial weakness following cerebral infarction: Secondary | ICD-10-CM | POA: Diagnosis not present

## 2021-12-14 DIAGNOSIS — I69354 Hemiplegia and hemiparesis following cerebral infarction affecting left non-dominant side: Secondary | ICD-10-CM | POA: Diagnosis not present

## 2021-12-14 DIAGNOSIS — I110 Hypertensive heart disease with heart failure: Secondary | ICD-10-CM | POA: Diagnosis not present

## 2021-12-14 DIAGNOSIS — I69328 Other speech and language deficits following cerebral infarction: Secondary | ICD-10-CM | POA: Diagnosis not present

## 2021-12-14 DIAGNOSIS — I5032 Chronic diastolic (congestive) heart failure: Secondary | ICD-10-CM | POA: Diagnosis not present

## 2021-12-14 DIAGNOSIS — G35 Multiple sclerosis: Secondary | ICD-10-CM | POA: Diagnosis not present

## 2021-12-17 DIAGNOSIS — I69354 Hemiplegia and hemiparesis following cerebral infarction affecting left non-dominant side: Secondary | ICD-10-CM | POA: Diagnosis not present

## 2021-12-17 DIAGNOSIS — I69392 Facial weakness following cerebral infarction: Secondary | ICD-10-CM | POA: Diagnosis not present

## 2021-12-17 DIAGNOSIS — I110 Hypertensive heart disease with heart failure: Secondary | ICD-10-CM | POA: Diagnosis not present

## 2021-12-17 DIAGNOSIS — I5032 Chronic diastolic (congestive) heart failure: Secondary | ICD-10-CM | POA: Diagnosis not present

## 2021-12-17 DIAGNOSIS — I69328 Other speech and language deficits following cerebral infarction: Secondary | ICD-10-CM | POA: Diagnosis not present

## 2021-12-17 DIAGNOSIS — G35 Multiple sclerosis: Secondary | ICD-10-CM | POA: Diagnosis not present

## 2021-12-18 DIAGNOSIS — G35 Multiple sclerosis: Secondary | ICD-10-CM | POA: Diagnosis not present

## 2021-12-18 DIAGNOSIS — I69354 Hemiplegia and hemiparesis following cerebral infarction affecting left non-dominant side: Secondary | ICD-10-CM | POA: Diagnosis not present

## 2021-12-18 DIAGNOSIS — I69392 Facial weakness following cerebral infarction: Secondary | ICD-10-CM | POA: Diagnosis not present

## 2021-12-18 DIAGNOSIS — I69328 Other speech and language deficits following cerebral infarction: Secondary | ICD-10-CM | POA: Diagnosis not present

## 2021-12-18 DIAGNOSIS — H903 Sensorineural hearing loss, bilateral: Secondary | ICD-10-CM | POA: Diagnosis not present

## 2021-12-18 DIAGNOSIS — I5032 Chronic diastolic (congestive) heart failure: Secondary | ICD-10-CM | POA: Diagnosis not present

## 2021-12-18 DIAGNOSIS — I110 Hypertensive heart disease with heart failure: Secondary | ICD-10-CM | POA: Diagnosis not present

## 2021-12-20 DIAGNOSIS — I5032 Chronic diastolic (congestive) heart failure: Secondary | ICD-10-CM | POA: Diagnosis not present

## 2021-12-20 DIAGNOSIS — I110 Hypertensive heart disease with heart failure: Secondary | ICD-10-CM | POA: Diagnosis not present

## 2021-12-20 DIAGNOSIS — I69392 Facial weakness following cerebral infarction: Secondary | ICD-10-CM | POA: Diagnosis not present

## 2021-12-20 DIAGNOSIS — I69328 Other speech and language deficits following cerebral infarction: Secondary | ICD-10-CM | POA: Diagnosis not present

## 2021-12-20 DIAGNOSIS — I69354 Hemiplegia and hemiparesis following cerebral infarction affecting left non-dominant side: Secondary | ICD-10-CM | POA: Diagnosis not present

## 2021-12-20 DIAGNOSIS — G35 Multiple sclerosis: Secondary | ICD-10-CM | POA: Diagnosis not present

## 2021-12-24 DIAGNOSIS — I69354 Hemiplegia and hemiparesis following cerebral infarction affecting left non-dominant side: Secondary | ICD-10-CM | POA: Diagnosis not present

## 2021-12-24 DIAGNOSIS — I5032 Chronic diastolic (congestive) heart failure: Secondary | ICD-10-CM | POA: Diagnosis not present

## 2021-12-24 DIAGNOSIS — I69328 Other speech and language deficits following cerebral infarction: Secondary | ICD-10-CM | POA: Diagnosis not present

## 2021-12-24 DIAGNOSIS — I110 Hypertensive heart disease with heart failure: Secondary | ICD-10-CM | POA: Diagnosis not present

## 2021-12-24 DIAGNOSIS — G35 Multiple sclerosis: Secondary | ICD-10-CM | POA: Diagnosis not present

## 2021-12-24 DIAGNOSIS — I69392 Facial weakness following cerebral infarction: Secondary | ICD-10-CM | POA: Diagnosis not present

## 2021-12-26 NOTE — Progress Notes (Signed)
PATIENT: [name] DOB: 1932-11-26  REASON FOR VISIT: follow up HISTORY FROM: patient  Chief Complaint  Patient presents with   Follow-up    RM 2 with [facility]- here for yearly f/u Pt reports she had had a lot to deal with this year ( ex. Stroke, pressure sore on left foot) MS sx have been the same.      HISTORY OF PRESENT ILLNESS: 12/27/21 ALL:  [name] is a 88 y.o. female here today for follow up for MS, most likely primary progressive. Not on DMT due to risks.   She feels that MS is stable. She was send for second opinion by Dr Ferrel Logan, [facility] neurology. Last visit 05/2021. She was referred to interdisciplinary clinic and OT for motorized wheelchair. She was able to get one but does not use it due to it being too big. She does not think she will be able to return to see interdisciplinary team at ths time. She is having ST/PT/OT at home.   She had a right basal ganglia CVA in 06/2021. She lost feeling and motor function of left side. She was wheelchair bound due to right hip fx in 03/2020. She was not eligible for hip replacement due to type of fracture. She can not bear weight on right side. She can transfer to toilet with assistance.   She continues nabumetone 500mg  BID (usually once daily) and baclofen increased to 09/18/19 (increased 07/2021). She does feels that increased dose did help with night cramps. Refills from [facility] but patient has only filled [name]'s prescription.    She was diagnosed with RA in 2019. She is no longer seeing rheumatology. She weaned herself off methotrexate, prednisone and Kevzara. She was scared of side effects and felt that medications was causing hair loss.    No vision changes or new weakness.  No new numbness noted.  She does continue to have the urge incontinence but feels that this is unchanged.  She is not interested in starting new medications at this time.  HISTORY: (copied from Dr Garth Bigness note on  12/27/2020)  [name] is an 88 year old woman with probable primary progressive multiple sclerosis.    Update 1/18/02022: She is not walking due to leg weakness an hip fracture April 2021.Marland Kitchen    She broke her hip with a fall..  Her orthopedic surgeon is Herbie Drape She reports due to the nature of the fracture (closed right acetabular fx with superimposes humeral head fx. ), she could not get a hip replacement.   She did not heal well and is still wheelchair bound as she can not bear weight on the right.      She continues to have right hip pain.     She takes Tylenol   Sometimes she gets spasms in her legs but this is not constant.   She is not sure if ever on baclofen.   She is on methocarbamol prn and not sure if it helps.    She has mild right hand weakness but arms generally do well.    She can transfer to a toilet without assistance but due to instability she has aides who help.   She has some urinary incontinence at times.     Bladder is doing worse with urgency and some urge incontinence   Other:  She also has RA and is on leflunomide and methotrexate.       Her PCP is having her see Dr. Ferrel Logan at  [facility] for a second opinion regarding her MS.       MS History:    She has had difficulty with her gait for many years. Her left leg seemed clumsy since at least 2000.   Her left toes would drag at times and her balance was poor.    About 2011, she fell at church and broke her pelvis tripping over a gown.   Her gait continues to worsen.   Her right leg does well.   Her left arm is slightly clumsy and slightly weaker compared to her right.     All of her changes have been gradual.     She denies any numbness.   In 2014, she had MRIs showing many T2/FLAIR hyperintense foci in the brain. At her age,  the pattern was felt to be nonspecific. However, MRI of the cervical and thoracic spine showed a plaque adjacent to C2 to the right and a plaque adjacent to T6 to the left   IMAGING: MRIs of her brain  and spine in 11/11/2013 were reviewed.   The MRI of the cervical spine shows a focus to the right at C2 and another focus to the left at C6. It also shows multilevel degenerative changes with anterolisthesis of C4 upon C5 but no nerve root compression.  No spinal cord compression.  The MRI of the thoracic spine shows a focus to the left adjacent to T6. It also showed a chronic T9 compression fracture (she was never aware of this).  MRI of the brain shows many T2/FLAIR hyperintense foci.  Foci are non-specific though many of the foci are radially oriented to the ventricles in the periventricular white matter. Some foci in the pons have an appearance more typical for chronic microvascular ischemic change. MRI 2014 showed a chronic compression fracture at T9 and she can not think of no other episode of significant back pain.   FH: Her one paternal first cousin and 2 maternal cousins have MS.    She does not know whether they had PPMS or RRMS.  REVIEW OF SYSTEMS: Out of a complete 14 system review of symptoms, the patient complains only of the following symptoms, leg cramps, congestion, drooling, weakness and all other reviewed systems are negative.  ALLERGIES: Allergies  Allergen Reactions   Sulfasalazine Anaphylaxis   Aspirin Nausea Only   Penicillins Diarrhea   Sulfa Antibiotics    Sulfamethoxazole-Trimethoprim Other (See Comments)    HOME MEDICATIONS: Outpatient Medications Prior to Visit  Medication Sig Dispense Refill   acetaminophen (TYLENOL) 325 MG tablet Take 2 tablets (650 mg total) by mouth every 6 (six) hours as needed for mild pain (or temp > 37.5 C (99.5 F)).     aspirin EC 81 MG EC tablet Take 1 tablet (81 mg total) by mouth daily. Swallow whole. 30 tablet 2   atorvastatin (LIPITOR) 80 MG tablet Take 1 tablet (80 mg total) by mouth daily. 30 tablet 1   baclofen (LIORESAL) 10 MG tablet TAKE ONE TABLET IN THE MORNING, 1 TAB AROUND 3 PM AND 2 TABLETS AT BEDTIME 120 tablet 5   Biotin  10000 MCG TABS Take 1,000 mcg by mouth daily.     Calcium Carb-Cholecalciferol (CALCIUM-VITAMIN D3) 250-125 MG-UNIT TABS Take 1 tablet by mouth 2 (two) times daily.     Calcium Carbonate-Vitamin D 600-200 MG-UNIT CAPS Take 1 capsule by mouth daily.     Carboxymethylcellul-Glycerin (LUBRICATING EYE DROPS OP) Place 1 drop into both eyes 2 (two) times daily. For  Eye Dryness     diclofenac Sodium (VOLTAREN) 1 % GEL APPLY 2 GMS TOPICALLY TO AFFECTED AREAS (RIGHT HIP) TWICE DAILY AS NEEDED FOR PAIN 50 g 0   levothyroxine (SYNTHROID) 25 MCG tablet Take 1 tablet (25 mcg total) by mouth daily. 30 tablet 0   Magnesium 250 MG TABS Take 1 tablet by mouth daily.     Multiple Vitamins-Minerals (ICAPS AREDS FORMULA PO) Take 1 capsule by mouth daily.      food thickener (SIMPLYTHICK, NECTAR/LEVEL 2/MILDLY THICK,) GEL Please use with all fluids for nectar thick consistency (Patient not taking: Reported on 12/27/2021) 100 packet 1   nabumetone (RELAFEN) 500 MG tablet TAKE ONE TABLET TWICE DAILY AS NEEDED 60 tablet 1   pantoprazole (PROTONIX) 40 MG tablet Take 1 tablet (40 mg total) by mouth daily. (Patient not taking: Reported on 12/27/2021) 30 tablet 1   Vitamin D, Ergocalciferol, (DRISDOL) 1.25 MG (50000 UNIT) CAPS capsule Take 50,000 Units by mouth every 7 (seven) days. (Patient not taking: Reported on 12/27/2021)     vitamin E 45 MG (100 UNITS) capsule Take 100 Units by mouth daily. (Patient not taking: Reported on 12/27/2021)     No facility-administered medications prior to visit.    PAST MEDICAL HISTORY: Past Medical History:  Diagnosis Date   Chronic venous insufficiency of lower extremity 01/25/2019   With chronic bilateral lower extremity edema   Incontinent of urine    Multiple sclerosis ([place]) 2015   Osteoporosis    osteopenia   Polyarthritis    Post-menopausal    Rheumatoid arthritis ([place])    Temporal arteritis ([place]) 2011    PAST SURGICAL HISTORY: Past Surgical History:  Procedure Laterality  Date   ABDOMINAL HYSTERECTOMY  1971   TVH   breast ductectomy Right 05/1995   COLONOSCOPY  10/2011    FAMILY HISTORY: Family History  Problem Relation Age of Onset   Osteoarthritis Father    Stroke Father    Heart failure Mother    Hypertension Mother    Heart disease Mother    Multiple sclerosis Cousin    Multiple sclerosis Cousin    Multiple sclerosis Cousin     SOCIAL HISTORY: Social History   Socioeconomic History   Marital status: Widowed    Spouse name: Not on file   Number of children: 2   Years of education: Not on file   Highest education level: Not on file  Occupational History   Not on file  Tobacco Use   Smoking status: Never   Smokeless tobacco: Never  Substance and Sexual Activity   Alcohol use: No   Drug use: No   Sexual activity: Not Currently    Partners: Male    Birth control/protection: Post-menopausal, Surgical    Comment: hysterectomy  Other Topics Concern   Not on file  Social History Narrative   Widowed mother of 2, grandmother 45 with 2 great-grandchildren.      Tries to workout at the [facility] several days a week 30 minutes at a time on stairstepper or elliptical.   Social Determinants of Health   Financial Resource Strain: Not on file  Food Insecurity: No Food Insecurity   Worried About Charity fundraiser in the Last Year: Never true   Ran Out of Food in the Last Year: Never true  Transportation Needs: No Transportation Needs   Lack of Transportation (Medical): No   Lack of Transportation (Non-Medical): No  Physical Activity: Not on file  Stress: Not on file  Social Connections: Not on file  Intimate Partner Violence: Not on file      PHYSICAL EXAM  Vitals:   12/27/21 1038  BP: 122/72  Pulse: 70  SpO2: 99%    There is no height or weight on file to calculate BMI.  Generalized: Well developed, in no acute distress  Cardiology: normal rate and rhythm, no murmur noted Respiratory: clear to auscultation bilaterally   Neurological examination  Mentation: Alert oriented to time, place, history taking. Follows all commands speech and language fluent Cranial nerve II-XII: Pupils were equal round reactive to light. Extraocular movements were full, visual field were full on confrontational test. Facial sensation and strength were normal. Head turning and shoulder shrug  were normal and symmetric. Motor: The motor testing reveals 4 over 5 strength of bilateral upper extremities. 2+/5 of right lower hip flexion, 3/5 lower flexion and extension, no movement of left lower.  Sensory: Sensory testing is intact to soft touch on all 4 extremities. No evidence of extinction is noted.  Gait and station: unable to ambulate, wheelchair bound    DIAGNOSTIC DATA (LABS, IMAGING, TESTING) - I reviewed patient records, labs, notes, testing and imaging myself where available.  No flowsheet data found.   Lab Results  Component Value Date   WBC 10.4 06/11/2021   HGB 13.2 06/11/2021   HCT 37.2 06/11/2021   MCV 91.0 06/11/2021   PLT 197 06/11/2021      Component Value Date/Time   NA 136 06/11/2021 0401   NA 135 (A) 06/05/2020 0000   K 3.7 06/11/2021 0401   CL 105 06/11/2021 0401   CO2 24 06/11/2021 0401   GLUCOSE 121 (H) 06/11/2021 0401   BUN 12 06/11/2021 0401   BUN 17 06/05/2020 0000   CREATININE 0.56 06/11/2021 0401   CREATININE 0.82 02/23/2019 1444   CALCIUM 8.3 (L) 06/11/2021 0401   PROT 6.2 (L) 06/07/2021 1141   ALBUMIN 3.9 06/07/2021 1141   AST 35 06/07/2021 1141   AST 27 02/23/2019 1444   ALT 19 06/07/2021 1141   ALT 27 02/23/2019 1444   ALKPHOS 67 06/07/2021 1141   BILITOT 0.7 06/07/2021 1141   BILITOT 0.4 02/23/2019 1444   GFRNONAA >60 06/11/2021 0401   GFRNONAA >60 02/23/2019 1444   GFRAA >90 06/05/2020 0000   GFRAA >60 02/23/2019 1444   Lab Results  Component Value Date   CHOL 232 (H) 06/08/2021   HDL 70 06/08/2021   LDLCALC 156 (H) 06/08/2021   TRIG 29 06/08/2021   CHOLHDL 3.3 06/08/2021    Lab Results  Component Value Date   HGBA1C 5.4 06/08/2021   No results found for: VITAMINB12 Lab Results  Component Value Date   TSH 2.95 05/03/2020     ASSESSMENT AND PLAN 88 y.o. year old female  has a past medical history of Chronic venous insufficiency of lower extremity (01/25/2019), Incontinent of urine, Multiple sclerosis ([place]) (2015), Osteoporosis, Polyarthritis, Post-menopausal, Rheumatoid arthritis ([place]), and Temporal arteritis ([place]) (2011). here with     ICD-10-CM   1. Multiple sclerosis, primary progressive ([place])  G35     2. Stroke of right basal ganglia (H[facility])  I63.81     3. Rheumatoid arthritis, involving unspecified site, unspecified whether rheumatoid factor present ([place])  M06.9     4. S/P right hip fracture  Z87.81     5. Wheelchair dependent  Z99.3        Tirza continues to recover from CVA in 07/2021. She will continue close follow up with  PT/OT and ST as advised. We will continue nabumetone 500mg  1-2 times daily and baclofen 09/18/19. She will continue refills through [place] and not duplicate with Dr Ferrel Logan. It is unclear if she plans to return to see Dr Ferrel Logan. She does not feel it would be feasible to get to her office regularly. She has a 24/7 caregiver but has difficulty with mobilization since stroke. Continue close follow-up with primary care. She was educated of when to seek emergency medical attention.  We will have her follow back up with Dr. Felecia Shelling in 6 months, sooner if needed.  She verbalizes understanding and agreement with this plan.   No orders of the defined types were placed in this encounter.    No orders of the defined types were placed in this encounter.      Debbora Presto, FNP-C 12/27/2021, 12:57 PM [facility] [address] [place], [place] [address] [phone]

## 2021-12-27 ENCOUNTER — Ambulatory Visit (INDEPENDENT_AMBULATORY_CARE_PROVIDER_SITE_OTHER): Payer: Medicare Other | Admitting: Family Medicine

## 2021-12-27 ENCOUNTER — Encounter: Payer: Self-pay | Admitting: Family Medicine

## 2021-12-27 VITALS — BP 122/72 | HR 70

## 2021-12-27 DIAGNOSIS — M069 Rheumatoid arthritis, unspecified: Secondary | ICD-10-CM | POA: Diagnosis not present

## 2021-12-27 DIAGNOSIS — G35 Multiple sclerosis: Secondary | ICD-10-CM

## 2021-12-27 DIAGNOSIS — Z8781 Personal history of (healed) traumatic fracture: Secondary | ICD-10-CM | POA: Diagnosis not present

## 2021-12-27 DIAGNOSIS — Z993 Dependence on wheelchair: Secondary | ICD-10-CM

## 2021-12-27 DIAGNOSIS — I6381 Other cerebral infarction due to occlusion or stenosis of small artery: Secondary | ICD-10-CM

## 2021-12-27 NOTE — Patient Instructions (Signed)
Below is our plan:  We will continue to monitor symptoms   Please make sure you are staying well hydrated. I recommend 50-60 ounces daily. Well balanced diet and regular exercise encouraged. Consistent sleep schedule with 6-8 hours recommended.   Please continue follow up with care team as directed.   Follow up with Dr Felecia Shelling in 6 months   You may receive a survey regarding today's visit. I encourage you to leave honest feed back as I do use this information to improve patient care. Thank you for seeing me today!

## 2021-12-28 DIAGNOSIS — I69354 Hemiplegia and hemiparesis following cerebral infarction affecting left non-dominant side: Secondary | ICD-10-CM | POA: Diagnosis not present

## 2021-12-28 DIAGNOSIS — I110 Hypertensive heart disease with heart failure: Secondary | ICD-10-CM | POA: Diagnosis not present

## 2021-12-28 DIAGNOSIS — I5032 Chronic diastolic (congestive) heart failure: Secondary | ICD-10-CM | POA: Diagnosis not present

## 2021-12-28 DIAGNOSIS — G35 Multiple sclerosis: Secondary | ICD-10-CM | POA: Diagnosis not present

## 2021-12-28 DIAGNOSIS — I69328 Other speech and language deficits following cerebral infarction: Secondary | ICD-10-CM | POA: Diagnosis not present

## 2021-12-28 DIAGNOSIS — I69392 Facial weakness following cerebral infarction: Secondary | ICD-10-CM | POA: Diagnosis not present

## 2021-12-29 DIAGNOSIS — G35 Multiple sclerosis: Secondary | ICD-10-CM | POA: Diagnosis not present

## 2021-12-29 DIAGNOSIS — I69354 Hemiplegia and hemiparesis following cerebral infarction affecting left non-dominant side: Secondary | ICD-10-CM | POA: Diagnosis not present

## 2021-12-29 DIAGNOSIS — I5032 Chronic diastolic (congestive) heart failure: Secondary | ICD-10-CM | POA: Diagnosis not present

## 2021-12-29 DIAGNOSIS — I69392 Facial weakness following cerebral infarction: Secondary | ICD-10-CM | POA: Diagnosis not present

## 2021-12-29 DIAGNOSIS — I69328 Other speech and language deficits following cerebral infarction: Secondary | ICD-10-CM | POA: Diagnosis not present

## 2021-12-29 DIAGNOSIS — I110 Hypertensive heart disease with heart failure: Secondary | ICD-10-CM | POA: Diagnosis not present

## 2021-12-31 DIAGNOSIS — G35 Multiple sclerosis: Secondary | ICD-10-CM | POA: Diagnosis not present

## 2021-12-31 DIAGNOSIS — I5032 Chronic diastolic (congestive) heart failure: Secondary | ICD-10-CM | POA: Diagnosis not present

## 2021-12-31 DIAGNOSIS — I69354 Hemiplegia and hemiparesis following cerebral infarction affecting left non-dominant side: Secondary | ICD-10-CM | POA: Diagnosis not present

## 2021-12-31 DIAGNOSIS — I110 Hypertensive heart disease with heart failure: Secondary | ICD-10-CM | POA: Diagnosis not present

## 2021-12-31 DIAGNOSIS — I69392 Facial weakness following cerebral infarction: Secondary | ICD-10-CM | POA: Diagnosis not present

## 2021-12-31 DIAGNOSIS — I69328 Other speech and language deficits following cerebral infarction: Secondary | ICD-10-CM | POA: Diagnosis not present

## 2022-01-03 DIAGNOSIS — I69328 Other speech and language deficits following cerebral infarction: Secondary | ICD-10-CM | POA: Diagnosis not present

## 2022-01-03 DIAGNOSIS — G35 Multiple sclerosis: Secondary | ICD-10-CM | POA: Diagnosis not present

## 2022-01-03 DIAGNOSIS — I5032 Chronic diastolic (congestive) heart failure: Secondary | ICD-10-CM | POA: Diagnosis not present

## 2022-01-03 DIAGNOSIS — I110 Hypertensive heart disease with heart failure: Secondary | ICD-10-CM | POA: Diagnosis not present

## 2022-01-03 DIAGNOSIS — I69354 Hemiplegia and hemiparesis following cerebral infarction affecting left non-dominant side: Secondary | ICD-10-CM | POA: Diagnosis not present

## 2022-01-03 DIAGNOSIS — I69392 Facial weakness following cerebral infarction: Secondary | ICD-10-CM | POA: Diagnosis not present

## 2022-01-04 DIAGNOSIS — I69392 Facial weakness following cerebral infarction: Secondary | ICD-10-CM | POA: Diagnosis not present

## 2022-01-04 DIAGNOSIS — I69354 Hemiplegia and hemiparesis following cerebral infarction affecting left non-dominant side: Secondary | ICD-10-CM | POA: Diagnosis not present

## 2022-01-04 DIAGNOSIS — I5032 Chronic diastolic (congestive) heart failure: Secondary | ICD-10-CM | POA: Diagnosis not present

## 2022-01-04 DIAGNOSIS — I69328 Other speech and language deficits following cerebral infarction: Secondary | ICD-10-CM | POA: Diagnosis not present

## 2022-01-04 DIAGNOSIS — G35 Multiple sclerosis: Secondary | ICD-10-CM | POA: Diagnosis not present

## 2022-01-04 DIAGNOSIS — I110 Hypertensive heart disease with heart failure: Secondary | ICD-10-CM | POA: Diagnosis not present

## 2022-01-07 DIAGNOSIS — I5032 Chronic diastolic (congestive) heart failure: Secondary | ICD-10-CM | POA: Diagnosis not present

## 2022-01-07 DIAGNOSIS — I110 Hypertensive heart disease with heart failure: Secondary | ICD-10-CM | POA: Diagnosis not present

## 2022-01-07 DIAGNOSIS — I69354 Hemiplegia and hemiparesis following cerebral infarction affecting left non-dominant side: Secondary | ICD-10-CM | POA: Diagnosis not present

## 2022-01-07 DIAGNOSIS — G35 Multiple sclerosis: Secondary | ICD-10-CM | POA: Diagnosis not present

## 2022-01-07 DIAGNOSIS — I69392 Facial weakness following cerebral infarction: Secondary | ICD-10-CM | POA: Diagnosis not present

## 2022-01-07 DIAGNOSIS — I69328 Other speech and language deficits following cerebral infarction: Secondary | ICD-10-CM | POA: Diagnosis not present

## 2022-01-09 ENCOUNTER — Other Ambulatory Visit: Payer: Self-pay | Admitting: *Deleted

## 2022-01-09 DIAGNOSIS — H353132 Nonexudative age-related macular degeneration, bilateral, intermediate dry stage: Secondary | ICD-10-CM | POA: Diagnosis not present

## 2022-01-09 NOTE — Patient Outreach (Signed)
[facility]) Care Management Telephonic RN Care Manager Note   01/09/2022 Name:  [name] MRN:  [id number] DOB:  1932/10/11  Summary: [name] call placed to member, successful.  Denies any urgent concerns, encouraged to contact this care manager with questions.     Subjective: [name] is an 88 y.o. year old female who is a primary patient of Gabriela Arabian, MD. The care management team was consulted for assistance with care management and/or care coordination needs.    Telephonic RN Care Manager completed Telephone Visit today.  Objective:   Medications Reviewed Today     Reviewed by Debbora Presto, NP (Nurse Practitioner) on 12/27/21 at 35  Med List Status: <None>   Medication Order Taking? Sig Documenting Provider Last Dose Status Informant    Discontinued 06/07/21 1934   acetaminophen (TYLENOL) 325 MG tablet [id number] Yes Take 2 tablets (650 mg total) by mouth every 6 (six) hours as needed for mild pain (or temp > 37.5 C (99.5 F)). Elgergawy, Silver Huguenin, MD Taking Active   aspirin EC 81 MG EC tablet [id number] Yes Take 1 tablet (81 mg total) by mouth daily. Swallow whole. Elgergawy, Silver Huguenin, MD Taking Active   atorvastatin (LIPITOR) 80 MG tablet [id number] Yes Take 1 tablet (80 mg total) by mouth daily. Elgergawy, Silver Huguenin, MD Taking Active     Discontinued 06/07/21 1934   baclofen (LIORESAL) 10 MG tablet [id number] Yes TAKE ONE TABLET IN THE MORNING, 1 TAB AROUND 3 PM AND 2 TABLETS AT BEDTIME Britt Bottom, MD Taking Active   Biotin 10000 MCG TABS [id number] Yes Take 1,000 mcg by mouth daily. [provider] Taking Active Multiple Informants  Calcium Carb-Cholecalciferol (CALCIUM-VITAMIN D3) 250-125 MG-UNIT TABS [id number] Yes Take 1 tablet by mouth 2 (two) times daily. [provider] Taking Active Multiple Informants  Calcium Carbonate-Vitamin D 600-200 MG-UNIT CAPS [id number] Yes Take 1 capsule by mouth daily. [provider] Taking Active Multiple Informants  Carboxymethylcellul-Glycerin (LUBRICATING EYE DROPS OP) [id number] Yes Place 1 drop into both eyes 2 (two) times daily. For Eye Dryness [provider] Taking Active Multiple Informants  diclofenac Sodium (VOLTAREN) 1 % GEL [id number] Yes APPLY 2 GMS TOPICALLY TO AFFECTED AREAS (RIGHT HIP) TWICE DAILY AS NEEDED FOR PAIN Wille Celeste, PA-C Taking Active Multiple Informants  food thickener (SIMPLYTHICK, NECTAR/LEVEL 2/MILDLY THICK,) GEL [id number] No Please use with all fluids for nectar thick consistency  Patient not taking: Reported on 12/27/2021   Elgergawy, Silver Huguenin, MD Not Taking Active      Discontinued 06/07/21 1934   levothyroxine (SYNTHROID) 25 MCG tablet [id number] Yes Take 1 tablet (25 mcg total) by mouth daily. Wille Celeste, PA-C Taking Active Multiple Informants  Magnesium 250 MG TABS [id number] Yes Take 1 tablet by mouth daily. [provider] Taking Active Multiple Informants  Multiple Vitamins-Minerals (ICAPS AREDS FORMULA PO) [id number] Yes Take 1 capsule by mouth daily.  [provider] Taking Active Multiple Informants  nabumetone (RELAFEN) 500 MG tablet [id number] No TAKE ONE TABLET TWICE DAILY AS NEEDED [name], Nanine Means, MD Unknown Active   pantoprazole (PROTONIX) 40 MG tablet [id number] No Take 1 tablet (40 mg total) by mouth daily.  Patient not taking: Reported on 12/27/2021   Elgergawy, Silver Huguenin, MD Not Taking Active   Vitamin D, Ergocalciferol, (DRISDOL) 1.25 MG (50000 UNIT) CAPS capsule [id number] No Take 50,000 Units by mouth every 7 (seven) days.  Patient not taking: Reported on 12/27/2021   [provider] Not Taking Active Multiple Informants           Med Note (S[place]FIELD, Armstead Peaks   Thu Jun 07, 2021  1:59 PM) Take on Tuesdays   vitamin E 45 MG (100 UNITS) capsule [id number] No Take 100 Units by mouth daily.  Patient not taking: Reported on 12/27/2021   [provider] Not Taking  Active Multiple Informants             SDOH:  (Social Determinants of Health) assessments and interventions performed:     Care Plan  Review of patient past medical history, allergies, medications, health status, including review of consultants reports, laboratory and other test data, was performed as part of comprehensive evaluation for care management services.   Care Plan : [facility] Plan of Care (Adult)  Updates made by Valente David, RN since 01/09/2022 12:00 AM     Problem: Difficulty with self care related to recent stroke and CHF   Priority: High     Long-Range Goal: Improved independence with management of self post stroke and for CHF   Start Date: 10/17/2021  Expected End Date: 04/16/2022  This Visit's Progress: On track  Recent Progress: On track  Priority: High  Note:   Current Barriers:  Chronic Disease Management support and education needs related to CHF and Stroke Transportation barriers  RNCM Clinical Goal(s):  Patient will verbalize understanding of plan for management of CHF and Stroke as evidenced by ability to self manage with minimal help from caregivers take all medications exactly as prescribed and will call provider for medication related questions as evidenced by reported medication compliance    attend all scheduled medical appointments: neurology in January as evidenced by attending provider appointment        work with community resource care guide to address needs related to Transportation and ADL IADL limitations as evidenced by patient and/or community resource care guide support    work with [facility] and DME company to continue gaining strength and mobility as evidenced by obtaining power chair and ability to transfer from chair/bed/BSC  through collaboration with Consulting civil engineer, provider, and care team.   Interventions:  Inter-disciplinary care team collaboration (see longitudinal plan of care) Evaluation of current treatment plan related to  self  management and patient's adherence to plan as established by provider   Heart Failure Interventions:  (Status: Goal on Track (progressing): YES.)  Long Term Goal  Provided education on low sodium diet Reviewed role of diuretics in prevention of fluid overload and management of heart failure Discussed the importance of keeping all appointments with provider  Stroke  (Status: Goal on Track (progressing): YES.) Long Term Goal  Evaluation of current treatment plan related to  Stroke , ADL IADL limitations self-management and patient's adherence to plan as established by provider. Discussed plans with patient for ongoing care management follow up and provided patient with direct contact information for care management team Advised patient to continue [facility] PT/OT to increase strength and follow up with DME company for power chair order; Discussed plans with patient for ongoing care management follow up and provided patient with direct contact information for care management team;  Patient Goals/Self-Care Activities: Patient will self administer medications as prescribed as evidenced by self report/primary caregiver report  Patient will attend all scheduled provider appointments as evidenced by clinician review of documented attendance to scheduled appointments and patient/caregiver report Patient will continue to perform ADL's independently as evidenced by patient/caregiver report keep legs up while sitting  use salt in moderation watch for swelling in feet, ankles and legs every day begin a heart failure diary bring diary to all appointments     Update 12/7 - Member report she is "fine."  She continues to work with home health for PT, OT, and ST.  She has received her power chair however she will need to learn additional tactics for maneuvering it as she state the foot rests are longer than her old one and unable to fit in some spaces.  She is also not happy with the headrest, prohibits her from  turning her head fully.  She will discuss these concerns with PT/OT for options.  She and home aides are still monitoring her blood pressure at least 3 days a week, all reported stable, unable to stand for daily weights.  Will follow up with neurologist for her MS in January and with PCP in March.   Update 1/4 - Member state she has been doing well, still active with home health team.  Report they have re-certified her for another month.  No further issues noted with her power chair, vitals stable.     Update 2/1 - Report she was seen by neurology on 1/19, good report given.  Her Baclofen was increased to muscle spasms, state this has decreased since.  She will follow up with them in 6 months, 7/27.  Continues to have the 24/7 caregivers as well as working with PT/OT/ST.  Will see PCP in March, report blood pressure readings have been normal, today was 120/60.        Plan:  Telephone follow up appointment with care management team member scheduled for:  1 month The patient has been provided with contact information for the care management team and has been advised to call with any health related questions or concerns.   Valente David, RN, MSN, [place] Manager [phone]

## 2022-01-10 DIAGNOSIS — M1990 Unspecified osteoarthritis, unspecified site: Secondary | ICD-10-CM | POA: Diagnosis not present

## 2022-01-10 DIAGNOSIS — E559 Vitamin D deficiency, unspecified: Secondary | ICD-10-CM | POA: Diagnosis not present

## 2022-01-10 DIAGNOSIS — I739 Peripheral vascular disease, unspecified: Secondary | ICD-10-CM | POA: Diagnosis not present

## 2022-01-10 DIAGNOSIS — Z7982 Long term (current) use of aspirin: Secondary | ICD-10-CM | POA: Diagnosis not present

## 2022-01-10 DIAGNOSIS — Z9181 History of falling: Secondary | ICD-10-CM | POA: Diagnosis not present

## 2022-01-10 DIAGNOSIS — I872 Venous insufficiency (chronic) (peripheral): Secondary | ICD-10-CM | POA: Diagnosis not present

## 2022-01-10 DIAGNOSIS — I2510 Atherosclerotic heart disease of native coronary artery without angina pectoris: Secondary | ICD-10-CM | POA: Diagnosis not present

## 2022-01-10 DIAGNOSIS — M21371 Foot drop, right foot: Secondary | ICD-10-CM | POA: Diagnosis not present

## 2022-01-10 DIAGNOSIS — E039 Hypothyroidism, unspecified: Secondary | ICD-10-CM | POA: Diagnosis not present

## 2022-01-10 DIAGNOSIS — Z90710 Acquired absence of both cervix and uterus: Secondary | ICD-10-CM | POA: Diagnosis not present

## 2022-01-10 DIAGNOSIS — M0600 Rheumatoid arthritis without rheumatoid factor, unspecified site: Secondary | ICD-10-CM | POA: Diagnosis not present

## 2022-01-10 DIAGNOSIS — R32 Unspecified urinary incontinence: Secondary | ICD-10-CM | POA: Diagnosis not present

## 2022-01-10 DIAGNOSIS — M316 Other giant cell arteritis: Secondary | ICD-10-CM | POA: Diagnosis not present

## 2022-01-10 DIAGNOSIS — E7800 Pure hypercholesterolemia, unspecified: Secondary | ICD-10-CM | POA: Diagnosis not present

## 2022-01-10 DIAGNOSIS — G35 Multiple sclerosis: Secondary | ICD-10-CM | POA: Diagnosis not present

## 2022-01-10 DIAGNOSIS — M8580 Other specified disorders of bone density and structure, unspecified site: Secondary | ICD-10-CM | POA: Diagnosis not present

## 2022-01-10 DIAGNOSIS — Z7902 Long term (current) use of antithrombotics/antiplatelets: Secondary | ICD-10-CM | POA: Diagnosis not present

## 2022-01-10 DIAGNOSIS — I110 Hypertensive heart disease with heart failure: Secondary | ICD-10-CM | POA: Diagnosis not present

## 2022-01-10 DIAGNOSIS — I69328 Other speech and language deficits following cerebral infarction: Secondary | ICD-10-CM | POA: Diagnosis not present

## 2022-01-10 DIAGNOSIS — M80051S Age-related osteoporosis with current pathological fracture, right femur, sequela: Secondary | ICD-10-CM | POA: Diagnosis not present

## 2022-01-10 DIAGNOSIS — Z8781 Personal history of (healed) traumatic fracture: Secondary | ICD-10-CM | POA: Diagnosis not present

## 2022-01-10 DIAGNOSIS — I5032 Chronic diastolic (congestive) heart failure: Secondary | ICD-10-CM | POA: Diagnosis not present

## 2022-01-10 DIAGNOSIS — I69354 Hemiplegia and hemiparesis following cerebral infarction affecting left non-dominant side: Secondary | ICD-10-CM | POA: Diagnosis not present

## 2022-01-10 DIAGNOSIS — I69392 Facial weakness following cerebral infarction: Secondary | ICD-10-CM | POA: Diagnosis not present

## 2022-01-10 DIAGNOSIS — M87251 Osteonecrosis due to previous trauma, right femur: Secondary | ICD-10-CM | POA: Diagnosis not present

## 2022-01-11 DIAGNOSIS — I69328 Other speech and language deficits following cerebral infarction: Secondary | ICD-10-CM | POA: Diagnosis not present

## 2022-01-11 DIAGNOSIS — G35 Multiple sclerosis: Secondary | ICD-10-CM | POA: Diagnosis not present

## 2022-01-11 DIAGNOSIS — I69354 Hemiplegia and hemiparesis following cerebral infarction affecting left non-dominant side: Secondary | ICD-10-CM | POA: Diagnosis not present

## 2022-01-11 DIAGNOSIS — I110 Hypertensive heart disease with heart failure: Secondary | ICD-10-CM | POA: Diagnosis not present

## 2022-01-11 DIAGNOSIS — I5032 Chronic diastolic (congestive) heart failure: Secondary | ICD-10-CM | POA: Diagnosis not present

## 2022-01-11 DIAGNOSIS — I69392 Facial weakness following cerebral infarction: Secondary | ICD-10-CM | POA: Diagnosis not present

## 2022-01-14 DIAGNOSIS — I5032 Chronic diastolic (congestive) heart failure: Secondary | ICD-10-CM | POA: Diagnosis not present

## 2022-01-14 DIAGNOSIS — I110 Hypertensive heart disease with heart failure: Secondary | ICD-10-CM | POA: Diagnosis not present

## 2022-01-14 DIAGNOSIS — I69354 Hemiplegia and hemiparesis following cerebral infarction affecting left non-dominant side: Secondary | ICD-10-CM | POA: Diagnosis not present

## 2022-01-14 DIAGNOSIS — I69328 Other speech and language deficits following cerebral infarction: Secondary | ICD-10-CM | POA: Diagnosis not present

## 2022-01-14 DIAGNOSIS — I69392 Facial weakness following cerebral infarction: Secondary | ICD-10-CM | POA: Diagnosis not present

## 2022-01-14 DIAGNOSIS — G35 Multiple sclerosis: Secondary | ICD-10-CM | POA: Diagnosis not present

## 2022-01-15 DIAGNOSIS — I69328 Other speech and language deficits following cerebral infarction: Secondary | ICD-10-CM | POA: Diagnosis not present

## 2022-01-15 DIAGNOSIS — I110 Hypertensive heart disease with heart failure: Secondary | ICD-10-CM | POA: Diagnosis not present

## 2022-01-15 DIAGNOSIS — G35 Multiple sclerosis: Secondary | ICD-10-CM | POA: Diagnosis not present

## 2022-01-15 DIAGNOSIS — I5032 Chronic diastolic (congestive) heart failure: Secondary | ICD-10-CM | POA: Diagnosis not present

## 2022-01-15 DIAGNOSIS — I69392 Facial weakness following cerebral infarction: Secondary | ICD-10-CM | POA: Diagnosis not present

## 2022-01-15 DIAGNOSIS — I69354 Hemiplegia and hemiparesis following cerebral infarction affecting left non-dominant side: Secondary | ICD-10-CM | POA: Diagnosis not present

## 2022-01-16 DIAGNOSIS — H3540 Unspecified peripheral retinal degeneration: Secondary | ICD-10-CM | POA: Diagnosis not present

## 2022-01-16 DIAGNOSIS — H43813 Vitreous degeneration, bilateral: Secondary | ICD-10-CM | POA: Diagnosis not present

## 2022-01-16 DIAGNOSIS — H353132 Nonexudative age-related macular degeneration, bilateral, intermediate dry stage: Secondary | ICD-10-CM | POA: Diagnosis not present

## 2022-01-16 DIAGNOSIS — H35371 Puckering of macula, right eye: Secondary | ICD-10-CM | POA: Diagnosis not present

## 2022-01-18 DIAGNOSIS — G35 Multiple sclerosis: Secondary | ICD-10-CM | POA: Diagnosis not present

## 2022-01-18 DIAGNOSIS — I69354 Hemiplegia and hemiparesis following cerebral infarction affecting left non-dominant side: Secondary | ICD-10-CM | POA: Diagnosis not present

## 2022-01-18 DIAGNOSIS — I69328 Other speech and language deficits following cerebral infarction: Secondary | ICD-10-CM | POA: Diagnosis not present

## 2022-01-18 DIAGNOSIS — I110 Hypertensive heart disease with heart failure: Secondary | ICD-10-CM | POA: Diagnosis not present

## 2022-01-18 DIAGNOSIS — I5032 Chronic diastolic (congestive) heart failure: Secondary | ICD-10-CM | POA: Diagnosis not present

## 2022-01-18 DIAGNOSIS — I69392 Facial weakness following cerebral infarction: Secondary | ICD-10-CM | POA: Diagnosis not present

## 2022-01-22 DIAGNOSIS — I69354 Hemiplegia and hemiparesis following cerebral infarction affecting left non-dominant side: Secondary | ICD-10-CM | POA: Diagnosis not present

## 2022-01-22 DIAGNOSIS — I69392 Facial weakness following cerebral infarction: Secondary | ICD-10-CM | POA: Diagnosis not present

## 2022-01-22 DIAGNOSIS — G35 Multiple sclerosis: Secondary | ICD-10-CM | POA: Diagnosis not present

## 2022-01-22 DIAGNOSIS — I69328 Other speech and language deficits following cerebral infarction: Secondary | ICD-10-CM | POA: Diagnosis not present

## 2022-01-22 DIAGNOSIS — I5032 Chronic diastolic (congestive) heart failure: Secondary | ICD-10-CM | POA: Diagnosis not present

## 2022-01-22 DIAGNOSIS — I110 Hypertensive heart disease with heart failure: Secondary | ICD-10-CM | POA: Diagnosis not present

## 2022-01-24 DIAGNOSIS — I69354 Hemiplegia and hemiparesis following cerebral infarction affecting left non-dominant side: Secondary | ICD-10-CM | POA: Diagnosis not present

## 2022-01-24 DIAGNOSIS — G35 Multiple sclerosis: Secondary | ICD-10-CM | POA: Diagnosis not present

## 2022-01-24 DIAGNOSIS — I110 Hypertensive heart disease with heart failure: Secondary | ICD-10-CM | POA: Diagnosis not present

## 2022-01-24 DIAGNOSIS — I69392 Facial weakness following cerebral infarction: Secondary | ICD-10-CM | POA: Diagnosis not present

## 2022-01-24 DIAGNOSIS — I69328 Other speech and language deficits following cerebral infarction: Secondary | ICD-10-CM | POA: Diagnosis not present

## 2022-01-24 DIAGNOSIS — I5032 Chronic diastolic (congestive) heart failure: Secondary | ICD-10-CM | POA: Diagnosis not present

## 2022-01-25 DIAGNOSIS — I69328 Other speech and language deficits following cerebral infarction: Secondary | ICD-10-CM | POA: Diagnosis not present

## 2022-01-25 DIAGNOSIS — I69392 Facial weakness following cerebral infarction: Secondary | ICD-10-CM | POA: Diagnosis not present

## 2022-01-25 DIAGNOSIS — I5032 Chronic diastolic (congestive) heart failure: Secondary | ICD-10-CM | POA: Diagnosis not present

## 2022-01-25 DIAGNOSIS — I110 Hypertensive heart disease with heart failure: Secondary | ICD-10-CM | POA: Diagnosis not present

## 2022-01-25 DIAGNOSIS — I69354 Hemiplegia and hemiparesis following cerebral infarction affecting left non-dominant side: Secondary | ICD-10-CM | POA: Diagnosis not present

## 2022-01-25 DIAGNOSIS — G35 Multiple sclerosis: Secondary | ICD-10-CM | POA: Diagnosis not present

## 2022-01-29 DIAGNOSIS — G35 Multiple sclerosis: Secondary | ICD-10-CM | POA: Diagnosis not present

## 2022-01-29 DIAGNOSIS — I5032 Chronic diastolic (congestive) heart failure: Secondary | ICD-10-CM | POA: Diagnosis not present

## 2022-01-29 DIAGNOSIS — I69354 Hemiplegia and hemiparesis following cerebral infarction affecting left non-dominant side: Secondary | ICD-10-CM | POA: Diagnosis not present

## 2022-01-29 DIAGNOSIS — I69328 Other speech and language deficits following cerebral infarction: Secondary | ICD-10-CM | POA: Diagnosis not present

## 2022-01-29 DIAGNOSIS — I69392 Facial weakness following cerebral infarction: Secondary | ICD-10-CM | POA: Diagnosis not present

## 2022-01-29 DIAGNOSIS — I110 Hypertensive heart disease with heart failure: Secondary | ICD-10-CM | POA: Diagnosis not present

## 2022-02-01 DIAGNOSIS — I5032 Chronic diastolic (congestive) heart failure: Secondary | ICD-10-CM | POA: Diagnosis not present

## 2022-02-01 DIAGNOSIS — I69392 Facial weakness following cerebral infarction: Secondary | ICD-10-CM | POA: Diagnosis not present

## 2022-02-01 DIAGNOSIS — G35 Multiple sclerosis: Secondary | ICD-10-CM | POA: Diagnosis not present

## 2022-02-01 DIAGNOSIS — I69354 Hemiplegia and hemiparesis following cerebral infarction affecting left non-dominant side: Secondary | ICD-10-CM | POA: Diagnosis not present

## 2022-02-01 DIAGNOSIS — I110 Hypertensive heart disease with heart failure: Secondary | ICD-10-CM | POA: Diagnosis not present

## 2022-02-01 DIAGNOSIS — I69328 Other speech and language deficits following cerebral infarction: Secondary | ICD-10-CM | POA: Diagnosis not present

## 2022-02-04 DIAGNOSIS — I69354 Hemiplegia and hemiparesis following cerebral infarction affecting left non-dominant side: Secondary | ICD-10-CM | POA: Diagnosis not present

## 2022-02-04 DIAGNOSIS — I69392 Facial weakness following cerebral infarction: Secondary | ICD-10-CM | POA: Diagnosis not present

## 2022-02-04 DIAGNOSIS — I5032 Chronic diastolic (congestive) heart failure: Secondary | ICD-10-CM | POA: Diagnosis not present

## 2022-02-04 DIAGNOSIS — I69328 Other speech and language deficits following cerebral infarction: Secondary | ICD-10-CM | POA: Diagnosis not present

## 2022-02-04 DIAGNOSIS — I110 Hypertensive heart disease with heart failure: Secondary | ICD-10-CM | POA: Diagnosis not present

## 2022-02-04 DIAGNOSIS — G35 Multiple sclerosis: Secondary | ICD-10-CM | POA: Diagnosis not present

## 2022-02-05 DIAGNOSIS — I110 Hypertensive heart disease with heart failure: Secondary | ICD-10-CM | POA: Diagnosis not present

## 2022-02-05 DIAGNOSIS — I69328 Other speech and language deficits following cerebral infarction: Secondary | ICD-10-CM | POA: Diagnosis not present

## 2022-02-05 DIAGNOSIS — I69354 Hemiplegia and hemiparesis following cerebral infarction affecting left non-dominant side: Secondary | ICD-10-CM | POA: Diagnosis not present

## 2022-02-05 DIAGNOSIS — I5032 Chronic diastolic (congestive) heart failure: Secondary | ICD-10-CM | POA: Diagnosis not present

## 2022-02-05 DIAGNOSIS — G35 Multiple sclerosis: Secondary | ICD-10-CM | POA: Diagnosis not present

## 2022-02-05 DIAGNOSIS — I69392 Facial weakness following cerebral infarction: Secondary | ICD-10-CM | POA: Diagnosis not present

## 2022-02-06 ENCOUNTER — Other Ambulatory Visit: Payer: Self-pay | Admitting: *Deleted

## 2022-02-06 NOTE — Patient Outreach (Signed)
[name]) Care Management Telephonic RN Care Manager Note   02/06/2022 Name:  [name] MRN:  [id number] DOB:  08-17-1932  Summary: [name] call placed to member, successful.  Denies any urgent concerns, encouraged to contact this care manager with questions.     Subjective: [name] is an 88 y.o. year old female who is a primary patient of Gabriela Arabian, MD. The care management team was consulted for assistance with care management and/or care coordination needs.    Telephonic RN Care Manager completed Telephone Visit today.  Objective:   Medications Reviewed Today     Reviewed by Debbora Presto, NP (Nurse Practitioner) on 12/27/21 at 90  Med List Status: <None>   Medication Order Taking? Sig Documenting Provider Last Dose Status Informant    Discontinued 06/07/21 1934   acetaminophen (TYLENOL) 325 MG tablet [id number] Yes Take 2 tablets (650 mg total) by mouth every 6 (six) hours as needed for mild pain (or temp > 37.5 C (99.5 F)). Elgergawy, Silver Huguenin, MD Taking Active   aspirin EC 81 MG EC tablet [id number] Yes Take 1 tablet (81 mg total) by mouth daily. Swallow whole. Elgergawy, Silver Huguenin, MD Taking Active   atorvastatin (LIPITOR) 80 MG tablet [id number] Yes Take 1 tablet (80 mg total) by mouth daily. Elgergawy, Silver Huguenin, MD Taking Active     Discontinued 06/07/21 1934   baclofen (LIORESAL) 10 MG tablet [id number] Yes TAKE ONE TABLET IN THE MORNING, 1 TAB AROUND 3 PM AND 2 TABLETS AT BEDTIME Britt Bottom, MD Taking Active   Biotin 10000 MCG TABS [id number] Yes Take 1,000 mcg by mouth daily. [provider] Taking Active Multiple Informants  Calcium Carb-Cholecalciferol (CALCIUM-VITAMIN D3) 250-125 MG-UNIT TABS [id number] Yes Take 1 tablet by mouth 2 (two) times daily. [provider] Taking Active Multiple Informants  Calcium Carbonate-Vitamin D 600-200 MG-UNIT CAPS [id number] Yes Take 1 capsule by mouth daily. [provider] Taking Active Multiple Informants  Carboxymethylcellul-Glycerin (LUBRICATING EYE DROPS OP) [id number] Yes Place 1 drop into both eyes 2 (two) times daily. For Eye Dryness [provider] Taking Active Multiple Informants  diclofenac Sodium (VOLTAREN) 1 % GEL [id number] Yes APPLY 2 GMS TOPICALLY TO AFFECTED AREAS (RIGHT HIP) TWICE DAILY AS NEEDED FOR PAIN Wille Celeste, PA-C Taking Active Multiple Informants  food thickener (SIMPLYTHICK, NECTAR/LEVEL 2/MILDLY THICK,) GEL [id number] No Please use with all fluids for nectar thick consistency  Patient not taking: Reported on 12/27/2021   Elgergawy, Silver Huguenin, MD Not Taking Active      Discontinued 06/07/21 1934   levothyroxine (SYNTHROID) 25 MCG tablet [id number] Yes Take 1 tablet (25 mcg total) by mouth daily. Wille Celeste, PA-C Taking Active Multiple Informants  Magnesium 250 MG TABS [id number] Yes Take 1 tablet by mouth daily. [provider] Taking Active Multiple Informants  Multiple Vitamins-Minerals (ICAPS AREDS FORMULA PO) [id number] Yes Take 1 capsule by mouth daily.  [provider] Taking Active Multiple Informants  nabumetone (RELAFEN) 500 MG tablet [id number] No TAKE ONE TABLET TWICE DAILY AS NEEDED [name], Nanine Means, MD Unknown Active   pantoprazole (PROTONIX) 40 MG tablet [id number] No Take 1 tablet (40 mg total) by mouth daily.  Patient not taking: Reported on 12/27/2021   Elgergawy, Silver Huguenin, MD Not Taking Active   Vitamin D, Ergocalciferol, (DRISDOL) 1.25 MG (50000 UNIT) CAPS capsule [id number] No Take 50,000 Units by mouth every 7 (seven) days.  Patient not taking: Reported on 12/27/2021   [provider] Not Taking Active Multiple Informants           Med Note (SATTERFIELD, Armstead Peaks   Thu Jun 07, 2021  1:59 PM) Take on Tuesdays   vitamin E 45 MG (100 UNITS) capsule [id number] No Take 100 Units by mouth daily.  Patient not taking: Reported on 12/27/2021   [provider] Not Taking  Active Multiple Informants             SDOH:  (Social Determinants of Health) assessments and interventions performed:     Care Plan  Review of patient past medical history, allergies, medications, health status, including review of consultants reports, laboratory and other test data, was performed as part of comprehensive evaluation for care management services.   Care Plan : [facility] Plan of Care (Adult)  Updates made by Valente David, RN since 02/06/2022 12:00 AM     Problem: Difficulty with self care related to recent stroke and CHF   Priority: High     Long-Range Goal: Improved independence with management of self post stroke and for CHF   Start Date: 10/17/2021  Expected End Date: 04/16/2022  This Visit's Progress: On track  Recent Progress: On track  Priority: High  Note:   Current Barriers:  Chronic Disease Management support and education needs related to CHF and Stroke Transportation barriers  RNCM Clinical Goal(s):  Patient will verbalize understanding of plan for management of CHF and Stroke as evidenced by ability to self manage with minimal help from caregivers take all medications exactly as prescribed and will call provider for medication related questions as evidenced by reported medication compliance    attend all scheduled medical appointments: neurology in January as evidenced by attending provider appointment        work with community resource care guide to address needs related to Transportation and ADL IADL limitations as evidenced by patient and/or community resource care guide support    work with [facility] and DME company to continue gaining strength and mobility as evidenced by obtaining power chair and ability to transfer from chair/bed/BSC  through collaboration with Consulting civil engineer, provider, and care team.   Interventions:  Inter-disciplinary care team collaboration (see longitudinal plan of care) Evaluation of current treatment plan related to  self  management and patient's adherence to plan as established by provider   Heart Failure Interventions:  (Status: Goal on Track (progressing): YES.)  Long Term Goal  Provided education on low sodium diet Reviewed role of diuretics in prevention of fluid overload and management of heart failure Discussed the importance of keeping all appointments with provider  Stroke  (Status: Goal on Track (progressing): YES.) Long Term Goal  Evaluation of current treatment plan related to  Stroke , ADL IADL limitations self-management and patient's adherence to plan as established by provider. Discussed plans with patient for ongoing care management follow up and provided patient with direct contact information for care management team Advised patient to continue [facility] PT/OT to increase strength and follow up with DME company for power chair order; Discussed plans with patient for ongoing care management follow up and provided patient with direct contact information for care management team;  Patient Goals/Self-Care Activities: Patient will self administer medications as prescribed as evidenced by self report/primary caregiver report  Patient will attend all scheduled provider appointments as evidenced by clinician review of documented attendance to scheduled appointments and patient/caregiver report Patient will continue to perform ADL's independently as evidenced by patient/caregiver report keep legs up while sitting  use salt in moderation watch for swelling in feet, ankles and legs every day begin a heart failure diary bring diary to all appointments     Update 12/7 - Member report she is "fine."  She continues to work with home health for PT, OT, and ST.  She has received her power chair however she will need to learn additional tactics for maneuvering it as she state the foot rests are longer than her old one and unable to fit in some spaces.  She is also not happy with the headrest, prohibits her from  turning her head fully.  She will discuss these concerns with PT/OT for options.  She and home aides are still monitoring her blood pressure at least 3 days a week, all reported stable, unable to stand for daily weights.  Will follow up with neurologist for her MS in January and with PCP in March.   Update 1/4 - Member state she has been doing well, still active with home health team.  Report they have re-certified her for another month.  No further issues noted with her power chair, vitals stable.     Update 2/1 - Report she was seen by neurology on 1/19, good report given.  Her Baclofen was increased to muscle spasms, state this has decreased since.  She will follow up with them in 6 months, 7/27.  Continues to have the 24/7 caregivers as well as working with PT/OT/ST.  Will see PCP in March, report blood pressure readings have been normal, today was 120/60.   Update 3/1 - State she will be completing all home health services this week.  She would like to continue however approval for re-certification was denied due to member hitting a plateau in her recovery.  She continues to have the caregivers daily that are helping with ADL's.  She is monitoring blood pressure daily, report it is stable, unable to weigh self as she is unable to stand (inoperable crushed hip from fall several years ago).  Denies any pain or discomfort at this time, PCP visit remains scheduled for next month.        Plan:  Telephone follow up appointment with care management team member scheduled for:  2 months The patient has been provided with contact information for the care management team and has been advised to call with any health related questions or concerns.   Valente David, RN, MSN, [place] Manager [phone]

## 2022-02-07 DIAGNOSIS — I110 Hypertensive heart disease with heart failure: Secondary | ICD-10-CM | POA: Diagnosis not present

## 2022-02-07 DIAGNOSIS — I69328 Other speech and language deficits following cerebral infarction: Secondary | ICD-10-CM | POA: Diagnosis not present

## 2022-02-07 DIAGNOSIS — I69354 Hemiplegia and hemiparesis following cerebral infarction affecting left non-dominant side: Secondary | ICD-10-CM | POA: Diagnosis not present

## 2022-02-07 DIAGNOSIS — I69392 Facial weakness following cerebral infarction: Secondary | ICD-10-CM | POA: Diagnosis not present

## 2022-02-07 DIAGNOSIS — G35 Multiple sclerosis: Secondary | ICD-10-CM | POA: Diagnosis not present

## 2022-02-07 DIAGNOSIS — I5032 Chronic diastolic (congestive) heart failure: Secondary | ICD-10-CM | POA: Diagnosis not present

## 2022-02-11 ENCOUNTER — Other Ambulatory Visit: Payer: Self-pay | Admitting: Neurology

## 2022-02-13 DIAGNOSIS — R202 Paresthesia of skin: Secondary | ICD-10-CM | POA: Diagnosis not present

## 2022-03-06 DIAGNOSIS — R202 Paresthesia of skin: Secondary | ICD-10-CM | POA: Diagnosis not present

## 2022-03-10 DIAGNOSIS — H353132 Nonexudative age-related macular degeneration, bilateral, intermediate dry stage: Secondary | ICD-10-CM | POA: Diagnosis not present

## 2022-03-11 ENCOUNTER — Other Ambulatory Visit: Payer: Self-pay | Admitting: Neurology

## 2022-03-11 DIAGNOSIS — E871 Hypo-osmolality and hyponatremia: Secondary | ICD-10-CM | POA: Diagnosis not present

## 2022-03-11 DIAGNOSIS — M810 Age-related osteoporosis without current pathological fracture: Secondary | ICD-10-CM | POA: Diagnosis not present

## 2022-03-11 DIAGNOSIS — Z8673 Personal history of transient ischemic attack (TIA), and cerebral infarction without residual deficits: Secondary | ICD-10-CM | POA: Diagnosis not present

## 2022-03-11 DIAGNOSIS — I6930 Unspecified sequelae of cerebral infarction: Secondary | ICD-10-CM | POA: Diagnosis not present

## 2022-03-11 DIAGNOSIS — E039 Hypothyroidism, unspecified: Secondary | ICD-10-CM | POA: Diagnosis not present

## 2022-03-11 DIAGNOSIS — M1990 Unspecified osteoarthritis, unspecified site: Secondary | ICD-10-CM | POA: Diagnosis not present

## 2022-03-11 DIAGNOSIS — G35 Multiple sclerosis: Secondary | ICD-10-CM | POA: Diagnosis not present

## 2022-03-11 DIAGNOSIS — E7800 Pure hypercholesterolemia, unspecified: Secondary | ICD-10-CM | POA: Diagnosis not present

## 2022-03-11 DIAGNOSIS — E559 Vitamin D deficiency, unspecified: Secondary | ICD-10-CM | POA: Diagnosis not present

## 2022-03-11 DIAGNOSIS — R54 Age-related physical debility: Secondary | ICD-10-CM | POA: Diagnosis not present

## 2022-03-11 DIAGNOSIS — R609 Edema, unspecified: Secondary | ICD-10-CM | POA: Diagnosis not present

## 2022-03-13 DIAGNOSIS — H353132 Nonexudative age-related macular degeneration, bilateral, intermediate dry stage: Secondary | ICD-10-CM | POA: Diagnosis not present

## 2022-03-13 DIAGNOSIS — H43813 Vitreous degeneration, bilateral: Secondary | ICD-10-CM | POA: Diagnosis not present

## 2022-03-13 DIAGNOSIS — H3540 Unspecified peripheral retinal degeneration: Secondary | ICD-10-CM | POA: Diagnosis not present

## 2022-03-13 DIAGNOSIS — H35371 Puckering of macula, right eye: Secondary | ICD-10-CM | POA: Diagnosis not present

## 2022-03-21 DIAGNOSIS — N952 Postmenopausal atrophic vaginitis: Secondary | ICD-10-CM | POA: Diagnosis not present

## 2022-03-21 DIAGNOSIS — L853 Xerosis cutis: Secondary | ICD-10-CM | POA: Diagnosis not present

## 2022-03-21 DIAGNOSIS — N949 Unspecified condition associated with female genital organs and menstrual cycle: Secondary | ICD-10-CM | POA: Diagnosis not present

## 2022-04-05 ENCOUNTER — Ambulatory Visit (INDEPENDENT_AMBULATORY_CARE_PROVIDER_SITE_OTHER): Payer: Medicare Other | Admitting: Podiatry

## 2022-04-05 DIAGNOSIS — M79674 Pain in right toe(s): Secondary | ICD-10-CM | POA: Diagnosis not present

## 2022-04-05 DIAGNOSIS — B351 Tinea unguium: Secondary | ICD-10-CM | POA: Diagnosis not present

## 2022-04-05 DIAGNOSIS — E113592 Type 2 diabetes mellitus with proliferative diabetic retinopathy without macular edema, left eye: Secondary | ICD-10-CM | POA: Insufficient documentation

## 2022-04-05 DIAGNOSIS — M0579 Rheumatoid arthritis with rheumatoid factor of multiple sites without organ or systems involvement: Secondary | ICD-10-CM

## 2022-04-05 DIAGNOSIS — L84 Corns and callosities: Secondary | ICD-10-CM | POA: Diagnosis not present

## 2022-04-05 DIAGNOSIS — M79675 Pain in left toe(s): Secondary | ICD-10-CM

## 2022-04-05 DIAGNOSIS — N949 Unspecified condition associated with female genital organs and menstrual cycle: Secondary | ICD-10-CM | POA: Insufficient documentation

## 2022-04-05 DIAGNOSIS — I6990 Unspecified sequelae of unspecified cerebrovascular disease: Secondary | ICD-10-CM | POA: Insufficient documentation

## 2022-04-05 DIAGNOSIS — L89899 Pressure ulcer of other site, unspecified stage: Secondary | ICD-10-CM | POA: Diagnosis not present

## 2022-04-05 DIAGNOSIS — Z8673 Personal history of transient ischemic attack (TIA), and cerebral infarction without residual deficits: Secondary | ICD-10-CM | POA: Insufficient documentation

## 2022-04-05 DIAGNOSIS — N952 Postmenopausal atrophic vaginitis: Secondary | ICD-10-CM | POA: Insufficient documentation

## 2022-04-05 DIAGNOSIS — G35 Multiple sclerosis: Secondary | ICD-10-CM

## 2022-04-08 ENCOUNTER — Other Ambulatory Visit: Payer: Self-pay | Admitting: *Deleted

## 2022-04-08 NOTE — Patient Outreach (Signed)
?[facility]) Care Management ?Telephonic RN Care Manager Note ? ? ?04/08/2022 ?Name:  Nonnie Pickney MRN:  [id number] DOB:  1932/06/20 ? ?Summary: ?Outgoing call placed to member, successful.  Denies any urgent concerns, encouraged to contact this care manager with questions.   ? ? ?Subjective: ?Braylon Lemmons is an 88 y.o. year old female who is a primary patient of [name], MD. The care management team was consulted for assistance with care management and/or care coordination needs.   ? ?Telephonic RN Care Manager completed Telephone Visit today. ? ?Objective:  ? ?Medications Reviewed Today   ? ? Reviewed by Debbora Presto, NP (Nurse Practitioner) on 12/27/21 at 1111  Med List Status: <None>  ? ?Medication Order Taking? Sig Documenting Provider Last Dose Status Informant  ?  Discontinued 06/07/21 1934   ?acetaminophen (TYLENOL) 325 MG tablet [id number] Yes Take 2 tablets (650 mg total) by mouth every 6 (six) hours as needed for mild pain (or temp > 37.5 C (99.5 F)). Elgergawy, Silver Huguenin, MD Taking Active   ?aspirin EC 81 MG EC tablet [id number] Yes Take 1 tablet (81 mg total) by mouth daily. Swallow whole. Elgergawy, Silver Huguenin, MD Taking Active   ?atorvastatin (LIPITOR) 80 MG tablet [id number] Yes Take 1 tablet (80 mg total) by mouth daily. Elgergawy, Silver Huguenin, MD Taking Active   ?  Discontinued 06/07/21 1934   ?baclofen (LIORESAL) 10 MG tablet [id number] Yes TAKE ONE TABLET IN THE MORNING, 1 TAB AROUND 3 PM AND 2 TABLETS AT BEDTIME [name], Nanine Means, MD Taking Active   ?Biotin 10000 MCG TABS [id number] Yes Take 1,000 mcg by mouth daily. [provider] Taking Active Multiple Informants  ?Calcium Carb-Cholecalciferol (CALCIUM-VITAMIN D3) 250-125 MG-UNIT TABS [id number] Yes Take 1 tablet by mouth 2 (two) times daily. [provider] Taking Active Multiple Informants  ?Calcium Carbonate-Vitamin D 600-200 MG-UNIT CAPS [id number] Yes Take 1 capsule by mouth daily. [provider] Taking Active Multiple Informants  ?Carboxymethylcellul-Glycerin (LUBRICATING EYE DROPS OP) [id number] Yes Place 1 drop into both eyes 2 (two) times daily. For Eye Dryness [provider] Taking Active Multiple Informants  ?diclofenac Sodium (VOLTAREN) 1 % GEL [id number] Yes APPLY 2 GMS TOPICALLY TO AFFECTED AREAS (RIGHT HIP) TWICE DAILY AS NEEDED FOR PAIN Wille Celeste, PA-C Taking Active Multiple Informants  ?food thickener (SIMPLYTHICK, NECTAR/LEVEL 2/MILDLY THICK,) GEL [id number] No Please use with all fluids for nectar thick consistency  ?Patient not taking: Reported on 12/27/2021  ? Elgergawy, Silver Huguenin, MD Not Taking Active   ?   Discontinued 06/07/21 1934   ?levothyroxine (SYNTHROID) 25 MCG tablet [id number] Yes Take 1 tablet (25 mcg total) by mouth daily. Wille Celeste, PA-C Taking Active Multiple Informants  ?Magnesium 250 MG TABS [id number] Yes Take 1 tablet by mouth daily. [provider] Taking Active Multiple Informants  ?Multiple Vitamins-Minerals (ICAPS AREDS FORMULA PO) [id number] Yes Take 1 capsule by mouth daily.  [provider] Taking Active Multiple Informants  ?nabumetone (RELAFEN) 500 MG tablet [id number] No TAKE ONE TABLET TWICE DAILY AS NEEDED [name], Nanine Means, MD Unknown Active   ?pantoprazole (PROTONIX) 40 MG tablet [id number] No Take 1 tablet (40 mg total) by mouth daily.  ?Patient not taking: Reported on 12/27/2021  ? Elgergawy, Silver Huguenin, MD Not Taking Active   ?Vitamin D, Ergocalciferol, (DRISDOL) 1.25 MG (50000 UNIT) CAPS capsule [id number] No Take 50,000 Units by mouth every 7 (seven) days.  ?Patient not taking: Reported on 12/27/2021  ? [provider] Not Taking Active Multiple Informants  ?         ?Med Note (SATTERFIELD, Armstead Peaks   Thu Jun 07, 2021  1:59 PM) Take on Tuesdays   ?vitamin E 45 MG (100 UNITS) capsule [id number] No Take 100 Units by mouth daily.  ?Patient not taking: Reported on 12/27/2021  ? [provider] Not Taking  Active Multiple Informants  ? ?  ?  ? ?  ? ? ? ?SDOH:  (Social Determinants of Health) assessments and interventions performed:  ? ? ? ?Care Plan ? ?Review of patient past medical history, allergies, medications, health status, including review of consultants reports, laboratory and other test data, was performed as part of comprehensive evaluation for care management services.  ? ?Care Plan : [facility] Plan of Care (Adult)  ?Updates made by Valente David, RN since 04/08/2022 12:00 AM  ?  ? ?Problem: Difficulty with self care related to recent stroke and CHF   ?Priority: High  ?  ? ?Long-Range Goal: Improved independence with management of self post stroke and for CHF Completed 04/08/2022  ?Start Date: 10/17/2021  ?Expected End Date: 04/16/2022  ?This Visit's Progress: On track  ?Recent Progress: On track  ?Priority: High  ?Note:   ?Current Barriers:  ?Chronic Disease Management support and education needs related to CHF and Stroke ?Transportation barriers ? ?RNCM Clinical Goal(s):  ?Patient will verbalize understanding of plan for management of CHF and Stroke as evidenced by ability to self manage with minimal help from caregivers ?take all medications exactly as prescribed and will call provider for medication related questions as evidenced by reported medication compliance    ?attend all scheduled medical appointments: neurology in January as evidenced by attending provider appointment        ?work with community resource care guide to address needs related to Transportation and ADL IADL limitations as evidenced by patient and/or community resource care guide support    ?work with [facility] and DME company to continue gaining strength and mobility as evidenced by obtaining power chair and ability to transfer from chair/bed/BSC  through collaboration with Consulting civil engineer, provider, and care team.  ? ?Interventions: ? ?Inter-disciplinary care team collaboration (see longitudinal plan of care) ?Evaluation of current treatment plan  related to  self management and patient's adherence to plan as established by provider ? ? ?Heart Failure Interventions:  (Status: Goal on Track (progressing): YES.)  Long Term Goal  ?Provided education on low sodium diet ?Reviewed role of diuretics in prevention of fluid overload and management of heart failure ?Discussed the importance of keeping all appointments with provider ? ?Stroke  (Status: Goal on Track (progressing): YES.) Long Term Goal  ?Evaluation of current treatment plan related to  Stroke , ADL IADL limitations self-management and patient's adherence to plan as established by provider. ?Discussed plans with patient for ongoing care management follow up and provided patient with direct contact information for care management team ?Advised patient to continue [facility] PT/OT to increase strength and follow up with DME company for power chair order; ?Discussed plans with patient for ongoing care management follow up and provided patient with direct contact information for care management team; ? ?Patient Goals/Self-Care Activities: ?Patient will self administer medications as prescribed as evidenced by self report/primary caregiver report  ?Patient will attend all scheduled provider appointments as evidenced by clinician review of documented attendance to scheduled appointments and patient/caregiver report ?Patient will continue to perform ADL's independently as evidenced by patient/caregiver report ?keep legs up while  sitting ?use salt in moderation ?watch for swelling in feet, ankles and legs every day ?begin a heart failure diary ?bring diary to all appointments ?  ? ? ?Update 12/7 - Member report she is "fine."  She continues to work with home health for PT, OT, and ST.  She has received her power chair however she will need to learn additional tactics for maneuvering it as she state the foot rests are longer than her old one and unable to fit in some spaces.  She is also not happy with the headrest,  prohibits her from turning her head fully.  She will discuss these concerns with PT/OT for options.  She and home aides are still monitoring her blood pressure at least 3 days a week, all reported stable, unable

## 2022-04-09 DIAGNOSIS — H353132 Nonexudative age-related macular degeneration, bilateral, intermediate dry stage: Secondary | ICD-10-CM | POA: Diagnosis not present

## 2022-04-09 NOTE — Patient Outreach (Signed)
[facility]) Care Management ? ?04/09/2022 ? ?Venetia Constable Surman ?[birth year] ?[id number] ? ? ?Referral received from [facility], RN for health coach Care Management services to assess as patient has history of HTN, CHF, DM, and stroke.  She is wheelchair bound due to [place]. Assigned to Emelia Loron, RN Care Coordinator follow up. ? ?Ina Homes ?[facility]-Care Management Assistant ?[phone]   ?  ?

## 2022-04-14 ENCOUNTER — Encounter: Payer: Self-pay | Admitting: Podiatry

## 2022-04-14 NOTE — Progress Notes (Signed)
?  Subjective:  ?Patient ID: [name], female    DOB: 27-Jan-1932,  MRN: [id number] ? ?[name] presents to clinic today for at risk foot care. Patient has history of MS, RA, s/p CVA. She is seen for painful thick toenails that are difficult to trim. Aggravating factors include wearing enclosed shoe gear. Pain is relieved with periodic professional debridement. ? ?Patient has h/o MS. She is accompanied by her caregiver, [name]. Patient states her daughter visited her from [place], and she really enjoyed time with her. ? ?New problem(s):  She does relate some discomfort located on the posterior aspect of her heels. Denies any drainage or swelling or skin breakdown.   She also states she believes her callus is back on the bottom of her left foot. ? ?PCP is Gabriela Arabian, MD , and last visit was March 28, 2022. ? ?Allergies  ?Allergen Reactions  ? Sulfasalazine Anaphylaxis  ? Aspirin Nausea Only  ? Penicillins Diarrhea  ? Sulfa Antibiotics   ? Sulfamethoxazole-Trimethoprim Other (See Comments)  ? ? ?Review of Systems: Negative except as noted in the HPI. ? ?Objective:  ?[name] is a pleasant 88 y.o. year old Caucasian female  in NAD. AAO x 3. Sitting up in wheelchair. ? ?Vascular Examination:  ?Capillary refill time to digits immediate b/l. Palpable DP pulses b/l. Faintly palpable PT pulses b/l. Pedal hair absent b/l Skin temperature gradient within normal limits b/l. Dependent rubor noted b/l. Edema b/l LE left >right. ? ?Dermatological Examination: ?Pedal skin with normal turgor, texture and tone bilaterally. No open wounds bilaterally. No interdigital macerations bilaterally. Toenails 1-5 b/l elongated, discolored, dystrophic, thickened, crumbly with subungual debris and tenderness to dorsal palpation. She does have hyperkeratotic lesion on the plantar aspect of her foot left submetatarsal head 5 with no erythema, no edema, no drainage, no blistering. ? ?She does have tenderness to  palpation on posterior aspect of her feet. No open wounds, no edema, no blistering, no drainage. There is tenderness to palpation. ? ?Musculoskeletal: ?Hammertoes noted to the 1-5 bilaterally. Utilizes wheelchair for mobility assistance.  Right lower extremity with fixed inversion. Foot drop RLE. ? ?Neurological: ?Protective sensation intact 5/5 intact bilaterally with 10g monofilament b/l.  Involuntary spasms noted b/l feet. ? ?  Latest Ref Rng & Units 06/08/2021  ?  1:43 AM  ?Hemoglobin A1C  ?Hemoglobin-A1c 4.8 - 5.6 % 5.4    ? ?Assessment/Plan: ?1. Pain due to onychomycosis of toenails of both feet   ?2. Multiple sclerosis ([name])   ?3. Rheumatoid arthritis involving multiple sites with positive rheumatoid factor ([name])   ?4. Callus   ?5. Pressure injury of skin of left foot, unspecified injury stage   ?  ?-Patient was evaluated and treated. All patient's and/or POA's questions/concerns answered on today's visit. ?-We did revisit and discuss pressure development on posterior aspect of her heel. Recommended use of heel protectors whenever she is in bed. She related understanding. ?-Mycotic toenails 1-5 bilaterally were debrided in length and girth with sterile nail nippers and dremel without incident. ?-Callus(es) submet head 5 left foot pared utilizing mandrel sander without complication or incident. Total number debrided =1. ?-Patient/POA to call should there be question/concern in the interim.  ? ?Return in about 3 months (around 07/05/2022). ? ?Marzetta Board, DPM  ?

## 2022-05-09 DIAGNOSIS — H353132 Nonexudative age-related macular degeneration, bilateral, intermediate dry stage: Secondary | ICD-10-CM | POA: Diagnosis not present

## 2022-05-17 DIAGNOSIS — H6123 Impacted cerumen, bilateral: Secondary | ICD-10-CM | POA: Insufficient documentation

## 2022-05-31 ENCOUNTER — Encounter: Payer: Self-pay | Admitting: Podiatry

## 2022-06-06 ENCOUNTER — Telehealth: Payer: Self-pay | Admitting: Internal Medicine

## 2022-06-06 ENCOUNTER — Ambulatory Visit (INDEPENDENT_AMBULATORY_CARE_PROVIDER_SITE_OTHER): Payer: Medicare Other | Admitting: Internal Medicine

## 2022-06-06 ENCOUNTER — Encounter: Payer: Self-pay | Admitting: Internal Medicine

## 2022-06-06 VITALS — BP 120/70 | HR 60 | Ht 65.0 in

## 2022-06-06 DIAGNOSIS — E039 Hypothyroidism, unspecified: Secondary | ICD-10-CM

## 2022-06-06 DIAGNOSIS — M810 Age-related osteoporosis without current pathological fracture: Secondary | ICD-10-CM | POA: Insufficient documentation

## 2022-06-06 DIAGNOSIS — H61032 Chondritis of left external ear: Secondary | ICD-10-CM | POA: Diagnosis not present

## 2022-06-06 DIAGNOSIS — R202 Paresthesia of skin: Secondary | ICD-10-CM | POA: Diagnosis not present

## 2022-06-06 LAB — VITAMIN D 25 HYDROXY (VIT D DEFICIENCY, FRACTURES): VITD: 85.7 ng/mL (ref 30.00–100.00)

## 2022-06-06 LAB — BASIC METABOLIC PANEL
BUN: 21 mg/dL (ref 6–23)
CO2: 29 mEq/L (ref 19–32)
Calcium: 9.6 mg/dL (ref 8.4–10.5)
Chloride: 98 mEq/L (ref 96–112)
Creatinine, Ser: 0.6 mg/dL (ref 0.40–1.20)
GFR: 80.52 mL/min (ref >60.00)
Glucose, Bld: 99 mg/dL (ref 70–99)
Potassium: 4.2 mEq/L (ref 3.5–5.1)
Sodium: 132 mEq/L — ABNORMAL LOW (ref 135–145)

## 2022-06-06 LAB — TSH: TSH: 3.1 u[IU]/mL (ref 0.35–5.50)

## 2022-06-06 NOTE — Progress Notes (Signed)
Name: [name]  MRN/ DOB: [id number], 31-Dec-1931    Age/ Sex: 88 y.o., female    PCP: [name], MD   Reason for Endocrinology Evaluation: Osteoporosis     Date of Initial Endocrinology Evaluation: 06/06/2022     HPI: [name] is a 88 y.o. female with a past medical history of HTN, CHF, MS, coronary artery disease,Hx of CVA, rheumatoid arthritis. The patient presented for initial endocrinology clinic visit on 06/06/2022 for consultative assistance with her osteoporosis.   Pt was diagnosed with osteoporosis: Many years ago   Menopausal at age : S/P hystrectomy  Fracture Hx: Yes, hip fracture  Hx of HRT:  FH of osteoporosis or hip fracture: no Prior Hx of anti-estrogenic therapy :no Prior Hx of anti-resorptive therapy : She was on Prolia for ~3 years, last dose was 2021   Calcium 600 daily  Vitamin D 50,000 iu weekly   She denies heartburn  Has mainly constipation    No prior radiation exposure  No prior dx cancer in the past  Pt in a wheelchair , has MS  Pt has  Dx of hypothyroidism, no prior sx or RAI . Has been on LT-4 replacement for years.    HISTORY:  Past Medical History:  Past Medical History:  Diagnosis Date   Chronic venous insufficiency of lower extremity 01/25/2019   With chronic bilateral lower extremity edema   Incontinent of urine    Multiple sclerosis ([place]) 2015   Osteoporosis    osteopenia   Polyarthritis    Post-menopausal    Rheumatoid arthritis ([place])    Temporal arteritis ([place]) 2011   Past Surgical History:  Past Surgical History:  Procedure Laterality Date   ABDOMINAL HYSTERECTOMY  1971   TVH   breast ductectomy Right 05/1995   COLONOSCOPY  10/2011    Social History:  reports that she has never smoked. She has never used smokeless tobacco. She reports that she does not drink alcohol and does not use drugs. Family History: family history includes Heart disease in her mother; Heart failure in her  mother; Hypertension in her mother; Multiple sclerosis in her cousin, cousin, and cousin; Osteoarthritis in her father; Stroke in her father.   HOME MEDICATIONS: Allergies as of 06/06/2022       Reactions   Sulfasalazine Anaphylaxis   Aspirin Nausea Only   Penicillins Diarrhea   Sulfa Antibiotics    Sulfamethoxazole-trimethoprim Other (See Comments)        Medication List        Accurate as of June 06, 2022 11:23 AM. If you have any questions, ask your nurse or doctor.          STOP taking these medications    Biotin 10000 MCG Tabs Stopped by: Dorita Sciara, MD   Calcium Carbonate-Vitamin D 600-200 MG-UNIT Caps Stopped by: Dorita Sciara, MD   ICAPS AREDS FORMULA PO Stopped by: Dorita Sciara, MD   pantoprazole 40 MG tablet Commonly known as: PROTONIX Stopped by: Dorita Sciara, MD   vitamin E 45 MG (100 UNITS) capsule Stopped by: Dorita Sciara, MD       TAKE these medications    acetaminophen 325 MG tablet Commonly known as: TYLENOL Take 2 tablets (650 mg total) by mouth every 6 (six) hours as needed for mild pain (or temp > 37.5 C (99.5 F)).   aspirin EC 81 MG tablet Take 1 tablet (81 mg total) by mouth daily. Swallow whole.  atorvastatin 80 MG tablet Commonly known as: LIPITOR Take 1 tablet (80 mg total) by mouth daily.   baclofen 10 MG tablet Commonly known as: LIORESAL TAKE ONE TABLET IN THE MORNING, 1 TAB AROUND 3 PM AND 2 TABLETS AT BEDTIME   Calcium-Vitamin D3 250-125 MG-UNIT Tabs Take 1 tablet by mouth 2 (two) times daily.   diclofenac Sodium 1 % Gel Commonly known as: Voltaren APPLY 2 GMS TOPICALLY TO AFFECTED AREAS (RIGHT HIP) TWICE DAILY AS NEEDED FOR PAIN   food thickener Gel Commonly known as: SIMPLYTHICK (NECTAR/LEVEL 2/MILDLY THICK) Please use with all fluids for nectar thick consistency   levothyroxine 25 MCG tablet Commonly known as: SYNTHROID Take 1 tablet (25 mcg total) by mouth daily.    LUBRICATING EYE DROPS OP Place 1 drop into both eyes 2 (two) times daily. For Eye Dryness   Magnesium 250 MG Tabs Take 1 tablet by mouth daily.   nabumetone 500 MG tablet Commonly known as: RELAFEN Take 1 tablet (500 mg total) by mouth 2 (two) times daily as needed.   Vitamin D (Ergocalciferol) 1.25 MG (50000 UNIT) Caps capsule Commonly known as: DRISDOL Take 50,000 Units by mouth every 7 (seven) days.          REVIEW OF SYSTEMS: A comprehensive ROS was conducted with the patient and is negative except as per HPI    OBJECTIVE:  VS: BP 120/70 (BP Location: Left Arm, Patient Position: Sitting, Cuff Size: Small)   Pulse 60   Ht '5\' 5"'$  (1.651 m)   LMP 09/08/1970 (Approximate)   SpO2 95%   BMI 19.97 kg/m    Wt Readings from Last 3 Encounters:  06/07/21 120 lb (54.4 kg)  07/27/20 121 lb (54.9 kg)  06/26/20 121 lb (54.9 kg)     EXAM: General: Pt appears well and is in NAD, pt in a wheel chair   Neck: General: Supple without adenopathy. Thyroid: Thyroid size normal.  No goiter or nodules appreciated.  Lungs: Clear with good BS bilat with no rales, rhonchi, or wheezes  Heart: Auscultation: RRR.  Extremities:  BL LE: Non pitting pretibial edema normal   Mental Status: Judgment, insight: Intact Orientation: Oriented to time, place, and person Mood and affect: No depression, anxiety, or agitation     DATA REVIEWED:  Latest Reference Range & Units 06/06/22 11:31  Sodium 135 - 145 mEq/L 132 (L)  Potassium 3.5 - 5.1 mEq/L 4.2  Chloride 96 - 112 mEq/L 98  CO2 19 - 32 mEq/L 29  Glucose 70 - 99 mg/dL 99  BUN 6 - 23 mg/dL 21  Creatinine 0.40 - 1.20 mg/dL 0.60  Calcium 8.4 - 10.5 mg/dL 9.6  GFR >60.00 mL/min 80.52    Latest Reference Range & Units 06/06/22 11:31  VITD 30.00 - 100.00 ng/mL 85.70    Latest Reference Range & Units 06/06/22 11:31  TSH 0.35 - 5.50 uIU/mL 3.10      ASSESSMENT/PLAN/RECOMMENDATIONS:   Osteoporosis :  - Pt with Hx of hip fracture  -  She was on Prolia for ~ 3 years but stopped in 2021 due to lack of supply  - She also tells me she was on Fosamax for decades but she is under the impression this was given as an HRT treatment  - Pt also tells me she took infusions at some point  - Today we discussed starting over with her osteoporosis management as she has not had Prolia in > 1 year and this is associated with rebound increase in  bone resorption, which means she most likely have lost BMD that was built while taking prolia for 3 years. We discussed  the importance of NOT discontinuing Prolia without having a secondary agent  -Labs today show normal GFR and serum calcium -Her vitamin D level is at the upper limit of normal we will stop ergocalciferol and switch to OTC vitamin D -We discussed treatment options to include bisphosphonates, Prolia, he is well as PTH analogs    Medications : Increase calcium 600 mg BID  Stop ergocalciferol 50,000 iu weekly  Start vitamin D3 2000 IU daily  2. Hypothyroidism :  -TSH is normal -No changes    Medication  Levothyroxine 25 mcg daily    F/U in 6 months   Signed electronically by: Mack Guise, MD  [facility] End[facility]in[facility]  [facility] [address] [place],  [address] Phone: [phone] FAX: [phone]   CC: [name], MD [address] [place] [address] Phone: [phone] Fax: [phone]   Return to Endocrinology clinic as below: Future Appointments  Date Time Provider [place]  07/04/2022 11:00 AM [name], Nanine Means, MD [facility] None  07/15/2022  2:15 PM Marzetta Board, DPM [facility]

## 2022-06-06 NOTE — Patient Instructions (Addendum)
Calcium 1200 mg daily  Continue Ergocalciferol 50,000 IU weekly    We will discuss treatment options after the Bone density scan

## 2022-06-06 NOTE — Telephone Encounter (Signed)
Appointment scheduled for 06/14/22 at 11:30am

## 2022-06-06 NOTE — Telephone Encounter (Signed)
Please schedule her for a bone density at [facility]

## 2022-06-07 LAB — PARATHYROID HORMONE, INTACT (NO CA): PTH: 15 pg/mL — ABNORMAL LOW (ref 16–77)

## 2022-06-08 DIAGNOSIS — H353132 Nonexudative age-related macular degeneration, bilateral, intermediate dry stage: Secondary | ICD-10-CM | POA: Diagnosis not present

## 2022-06-12 ENCOUNTER — Telehealth: Payer: Self-pay | Admitting: Internal Medicine

## 2022-06-12 DIAGNOSIS — M810 Age-related osteoporosis without current pathological fracture: Secondary | ICD-10-CM

## 2022-06-12 MED ORDER — FORTEO 600 MCG/2.4ML ~~LOC~~ SOPN
20.0000 ug | PEN_INJECTOR | Freq: Every day | SUBCUTANEOUS | 11 refills | Status: DC
Start: 2022-06-12 — End: 2022-07-02

## 2022-06-12 NOTE — Telephone Encounter (Signed)
Called the pt on 06/12/2022 to discuss low PTH   I have recommended Forteo, the patient believes she may have been on it in the past, no reported side effects    We will restart 20 mcg daily, a prescription has been sent to her pharmacy     Williamson, MD  [facility] End[facility]in[facility]  [facility] [address] [place], [place] [address] Phone: [phone] FAX: [phone]

## 2022-06-20 DIAGNOSIS — Z780 Asymptomatic menopausal state: Secondary | ICD-10-CM | POA: Diagnosis not present

## 2022-06-29 ENCOUNTER — Other Ambulatory Visit: Payer: Self-pay | Admitting: Neurology

## 2022-07-01 ENCOUNTER — Telehealth: Payer: Self-pay | Admitting: Internal Medicine

## 2022-07-01 NOTE — Telephone Encounter (Signed)
Left a message to discuss most recent DXA scan on 07/01/2022 at Rendon, MD  [facility] Endocrinology  [facility] [address] [place], [place] [address] Phone: [phone] FAX: [phone]

## 2022-07-02 ENCOUNTER — Other Ambulatory Visit: Payer: Self-pay | Admitting: Internal Medicine

## 2022-07-02 NOTE — Telephone Encounter (Signed)
I have contacted the patient on 07/02/2022 at 1530 and discussed normal DXA scan with a T score of -0.8 at the distal one third of radius   The patient has not started Forteo-we opted to NOT start at this time    Taking calcium twice daily has caused constipation, patient was advised to use stool softeners , she was also advised to reduce calcium to 1 tablet daily but make sure that she consumes enough calcium through her diet with 2-3 servings of calcium a day    Patient was advised again to stop ergocalciferol 50,000 weekly and start OTC vitamin D3 2000 IU daily     Patient expressed understanding Abby Nena Jordan, MD  [facility] Endocrinology  [facility] [address] [place], [place] [address] Phone: [place]: [phone]

## 2022-07-02 NOTE — Telephone Encounter (Signed)
Patient called back to discuss DXA scan

## 2022-07-04 ENCOUNTER — Ambulatory Visit (INDEPENDENT_AMBULATORY_CARE_PROVIDER_SITE_OTHER): Payer: Medicare Other | Admitting: Neurology

## 2022-07-04 ENCOUNTER — Encounter: Payer: Self-pay | Admitting: Neurology

## 2022-07-04 VITALS — BP 133/59 | HR 63 | Ht 65.0 in | Wt 120.0 lb

## 2022-07-04 DIAGNOSIS — Z993 Dependence on wheelchair: Secondary | ICD-10-CM | POA: Diagnosis not present

## 2022-07-04 DIAGNOSIS — M069 Rheumatoid arthritis, unspecified: Secondary | ICD-10-CM

## 2022-07-04 DIAGNOSIS — I6381 Other cerebral infarction due to occlusion or stenosis of small artery: Secondary | ICD-10-CM

## 2022-07-04 DIAGNOSIS — G35 Multiple sclerosis: Secondary | ICD-10-CM

## 2022-07-04 DIAGNOSIS — Z8781 Personal history of (healed) traumatic fracture: Secondary | ICD-10-CM | POA: Diagnosis not present

## 2022-07-04 MED ORDER — BACLOFEN 10 MG PO TABS: ORAL_TABLET | ORAL | 11 refills | Status: DC

## 2022-07-04 MED ORDER — NABUMETONE 500 MG PO TABS: 500.0000 mg | ORAL_TABLET | Freq: Two times a day (BID) | ORAL | 0 refills | Status: DC | PRN

## 2022-07-04 NOTE — Progress Notes (Addendum)
[facility]  PATIENT: [name] DOB: Mar 23, 1932  REFERRING DOCTOR OR PCP:  [name] SOURCE:   Patient, notes from Dr. Marisue Humble and Dr. Catalina Gravel, imaging results, MRI images on PACS.  _________________________________   HISTORICAL  CHIEF COMPLAINT:  Chief Complaint  Patient presents with   Follow-up    Rm 1, alone (caregiver in [place]). Here for 6 month MS, off DMT. Pt c/o of cramps in legs.     HISTORY OF PRESENT ILLNESS:  [name] is an 88 year old woman with probable primary progressive multiple sclerosis.   Update 1/18/02022: She reports seeing Dr. Ferrel Logan at [facility] for a second opinion on her MS.   She was referred to therapy and got a more custom electric wheelchair and baclofen was tried.  She agreed that Ms. Mcclane has PPMS ad risks of Ocrevus likely higher than potential benefit.     She uses a wheelchair due to leg weakness an hip fracture April 2021.Marland Kitchen   An AFO breace caused a sore and she no longer  uses it.     She has leg spasticity, worse with sleep.  Baclofen helps spasticity some but it makes her sleepy so will no increase further.    It   She broke her hip with a fall in 2021.Marland Kitchen  She reports due to the nature of the fracture (closed right acetabular fx with superimposes humeral head fx. ), she could not get a hip replacement.   She did not heal well and is still wheelchair bound as she can not bear weight on the right.     She continues to have right hip pain.     She takes Nabumetone   Left hand is mildly weak and right is minimally weak.    She needs help to transfer to a toilet or couch/chair.   She has 24/7 nurses aides.     She has some urinary incontinence at times.     Bladder is doing worse with urgency and some urge incontinence  She had a stroke in 2022 (right basal ganglia) and lost some  use of her left side.   She is doing PT and OT.  She was already wheelchair bound since a right hip fracture in 2021.   Currently  needing assistance with a sliding board and was doing better before the stroke.    Bladder is fine.          MS History:    She has had difficulty with her gait for many years. Her left leg seemed clumsy since at least 2000.   Her left toes would drag at times and her balance was poor.    About 2011, she fell at church and broke her pelvis tripping over a gown.   Her gait continues to worsen.   Her right leg does well.   Her left arm is slightly clumsy and slightly weaker compared to her right.     All of her changes have been gradual.     She denies any numbness.   In 2014, she had MRIs showing many T2/FLAIR hyperintense foci in the brain. At her age,  the pattern was felt to be nonspecific. However, MRI of the cervical and thoracic spine showed a plaque adjacent to C2 to the right and a plaque adjacent to T6 to the left  IMAGING: MRIs of her brain and spine in 11/11/2013 were reviewed.   The MRI of the cervical spine shows a focus to the right at  C2 and another focus to the left at C6. It also shows multilevel degenerative changes with anterolisthesis of C4 upon C5 but no nerve root compression.  No spinal cord compression.  The MRI of the thoracic spine shows a focus to the left adjacent to T6. It also showed a chronic T9 compression fracture (she was never aware of this).  MRI of the brain shows many T2/FLAIR hyperintense foci.  Foci are non-specific though many of the foci are radially oriented to the ventricles in the periventricular white matter. Some foci in the pons have an appearance more typical for chronic microvascular ischemic change. MRI 2014 showed a chronic compression fracture at T9 and she can not think of no other episode of significant back pain.  FH: Her one paternal first cousin and 2 maternal cousins have MS.    She does not know whether they had PPMS or RRMS.    REVIEW OF SYSTEMS: Constitutional: No fevers, chills, sweats, or change in appetite.   Some fatigue.   Occ insomnia  (sleep maintenance).    Pedal edema.   Eyes: No visual changes, double vision, eye pain Ear, nose and throat: No hearing loss, ear pain, nasal congestion, sore throat Cardiovascular: No chest pain, palpitations.   Echo shows grade 2 diastolic dysfunction.   Respiratory:  No shortness of breath at rest or with exertion.   No wheezes.    GastrointestinaI: No nausea, vomiting, diarrhea, abdominal pain, fecal incontinence Genitourinary:  see above.  .    Musculoskeletal:  No current neck pain, back pain Integumentary: No rash, pruritus, skin lesions Neurological: as above Psychiatric: Mild depression but no anxiety Endocrine: No palpitations, diaphoresis, change in appetite, change in weigh or increased thirst Hematologic/Lymphatic:  No anemia, purpura, petechiae. Allergic/Immunologic: No itchy/runny eyes, nasal congestion, recent allergic reactions, rashes  ALLERGIES: Allergies  Allergen Reactions   Sulfasalazine Anaphylaxis   Aspirin Nausea Only   Penicillins Diarrhea   Sulfa Antibiotics    Sulfamethoxazole-Trimethoprim Other (See Comments)    HOME MEDICATIONS:  Current Outpatient Medications:    acetaminophen (TYLENOL) 325 MG tablet, Take 2 tablets (650 mg total) by mouth every 6 (six) hours as needed for mild pain (or temp > 37.5 C (99.5 F))., Disp: , Rfl:    aspirin EC 81 MG EC tablet, Take 1 tablet (81 mg total) by mouth daily. Swallow whole., Disp: 30 tablet, Rfl: 2   atorvastatin (LIPITOR) 80 MG tablet, Take 1 tablet (80 mg total) by mouth daily., Disp: 30 tablet, Rfl: 1   baclofen (LIORESAL) 10 MG tablet, TAKE ONE TABLET IN THE MORNING, 1 TAB AROUND 3 PM AND 2 TABLETS AT BEDTIME, Disp: 120 tablet, Rfl: 11   Calcium Carb-Cholecalciferol (CALCIUM-VITAMIN D3) 250-125 MG-UNIT TABS, Take 1 tablet by mouth 2 (two) times daily., Disp: , Rfl:    Carboxymethylcellul-Glycerin (LUBRICATING EYE DROPS OP), Place 1 drop into both eyes 2 (two) times daily. For Eye Dryness, Disp: , Rfl:     diclofenac Sodium (VOLTAREN) 1 % GEL, APPLY 2 GMS TOPICALLY TO AFFECTED AREAS (RIGHT HIP) TWICE DAILY AS NEEDED FOR PAIN, Disp: 50 g, Rfl: 0   food thickener (SIMPLYTHICK, NECTAR/LEVEL 2/MILDLY THICK,) GEL, Please use with all fluids for nectar thick consistency, Disp: 100 packet, Rfl: 1   levothyroxine (SYNTHROID) 25 MCG tablet, Take 1 tablet (25 mcg total) by mouth daily., Disp: 30 tablet, Rfl: 0   Magnesium 250 MG TABS, Take 1 tablet by mouth daily., Disp: , Rfl:    nabumetone (RELAFEN) 500 MG  tablet, Take 1 tablet (500 mg total) by mouth 2 (two) times daily as needed., Disp: 60 tablet, Rfl: 0   Vitamin D, Ergocalciferol, (DRISDOL) 1.25 MG (50000 UNIT) CAPS capsule, Take 50,000 Units by mouth every 7 (seven) days., Disp: , Rfl:   PAST MEDICAL HISTORY: Past Medical History:  Diagnosis Date   Chronic venous insufficiency of lower extremity 01/25/2019   With chronic bilateral lower extremity edema   Incontinent of urine    Multiple sclerosis ([place]) 2015   Osteoporosis    osteopenia   Polyarthritis    Post-menopausal    Rheumatoid arthritis ([place])    Temporal arteritis ([place]) 2011    PAST SURGICAL HISTORY: Past Surgical History:  Procedure Laterality Date   ABDOMINAL HYSTERECTOMY  1971   TVH   breast ductectomy Right 05/1995   COLONOSCOPY  10/2011    FAMILY HISTORY: Family History  Problem Relation Age of Onset   Osteoarthritis Father    Stroke Father    Heart failure Mother    Hypertension Mother    Heart disease Mother    Multiple sclerosis Cousin    Multiple sclerosis Cousin    Multiple sclerosis Cousin     SOCIAL HISTORY:  Social History   Socioeconomic History   Marital status: Widowed    Spouse name: Not on file   Number of children: 2   Years of education: Not on file   Highest education level: Not on file  Occupational History   Not on file  Tobacco Use   Smoking status: Never   Smokeless tobacco: Never  Substance and Sexual Activity   Alcohol use: No    Drug use: No   Sexual activity: Not Currently    Partners: Male    Birth control/protection: Post-menopausal, Surgical    Comment: hysterectomy  Other Topics Concern   Not on file  Social History Narrative   Widowed mother of 2, grandmother 41 with 2 great-grandchildren.      Tries to workout at the [facility] several days a week 30 minutes at a time on stairstepper or elliptical.   Social Determinants of Health   Financial Resource Strain: Not on file  Food Insecurity: No Food Insecurity (06/26/2021)   Hunger Vital Sign    Worried About Running Out of Food in the Last Year: Never true    Ran Out of Food in the Last Year: Never true  Transportation Needs: No Transportation Needs (06/18/2021)   PRAPARE - Hydrologist (Medical): No    Lack of Transportation (Non-Medical): No  Physical Activity: Not on file  Stress: Not on file  Social Connections: Not on file  Intimate Partner Violence: Not on file     PHYSICAL EXAM  Vitals:   07/04/22 1053  BP: (!) 133/59  Pulse: 63  Weight: 120 lb (54.4 kg)  Height: '5\' 5"'$  (1.651 m)    Body mass index is 19.97 kg/m.   General: The patient is well-developed and well-nourished and in no acute distress   Skin: Extremities show severe bilateral pitting pedal edema.   Neurologic Exam  Mental status: The patient is alert and oriented x 3 at the time of the examination. The patient has apparent normal recent and remote memory, with an apparently normal attention span and concentration ability.   Speech is normal.  Cranial nerves: Extraocular movements are full. The facial strength and facial sensation are normal. Trapezius strength is normal.  The tongue is midline, and the patient has symmetric  elevation of the soft palate. No obvious hearing deficits are noted.  Motor:  Muscle bulk is normal.   Tone is increased in the legs, left > right.   Strength is 5/5 in the right arm and 4+/5 in the left arm and she has reduced  left RAM).  Strength is 4/5 in the right leg and 4 -/5 in left leg   Sensory: Sensory testing shows reduced touch and vibration on her right leg relative to the left but reduced right hand vibration and temperature sensations on the left leg worse than left arm.  Reduced sensation to vibration at the toes.  Coordination: Cerebellar testing shows good finger-nose-finger but reduced left heel-to-shin.  Gait and station: Station is normal.   She is unable to walk . Romberg is negative.  Reflexes: Deep tendon reflexes are symmetric and normal bilaterally.   Plantar responses are extensor on her left.    DIAGNOSTIC DATA (LABS, IMAGING, TESTING) - I reviewed patient records, labs, notes, testing and imaging myself where available.  Lab Results  Component Value Date   WBC 10.4 06/11/2021   HGB 13.2 06/11/2021   HCT 37.2 06/11/2021   MCV 91.0 06/11/2021   PLT 197 06/11/2021      Component Value Date/Time   NA 132 (L) 06/06/2022 1131   NA 135 (A) 06/05/2020 0000   K 4.2 06/06/2022 1131   CL 98 06/06/2022 1131   CO2 29 06/06/2022 1131   GLUCOSE 99 06/06/2022 1131   BUN 21 06/06/2022 1131   BUN 17 06/05/2020 0000   CREATININE 0.60 06/06/2022 1131   CREATININE 0.82 02/23/2019 1444   CALCIUM 9.6 06/06/2022 1131   PROT 6.2 (L) 06/07/2021 1141   ALBUMIN 3.9 06/07/2021 1141   AST 35 06/07/2021 1141   AST 27 02/23/2019 1444   ALT 19 06/07/2021 1141   ALT 27 02/23/2019 1444   ALKPHOS 67 06/07/2021 1141   BILITOT 0.7 06/07/2021 1141   BILITOT 0.4 02/23/2019 1444   GFRNONAA >60 06/11/2021 0401   GFRNONAA >60 02/23/2019 1444   GFRAA >90 06/05/2020 0000   GFRAA >60 02/23/2019 1444       ASSESSMENT AND PLAN  Multiple sclerosis, primary progressive (HCC)  Stroke of right basal ganglia (HCC)  Rheumatoid arthritis, involving unspecified site, unspecified whether rheumatoid factor present ([place])  S/P right hip fracture  Wheelchair dependent   1.   She will continue off of a  DMT.  She appears to have PPMS though I can't rule out inactive SPMS.  Progression has been very slow and risks of Ocrevus likely outweigh the benefits.   Now off all RA She was on Kevzara (MOA is anti-IL6).  Previously was on MTX/leflunomide 2.   Stay active as possible.  Use wheelchair for safety.   Increase activity if advised by orthopedics.   3.   Gait disturbance is multifactorial and a couple events in 2019 and 2021stepwise worsening.  Between MS, hip fracture and h/o CVA she will likely always need a wheelchair.   3.   She takes baclofen 10 - 10 - 20 mg over the day.  She feels it wears off some by the morning 4.   rtc 12 months or sooner if new or worsening neurologic issues.   Can alternate visits with NP.   40-minute office visit with the majority of the time spent face-to-face for history and physical, discussion/counseling and decision-making.  Additional time with record review and documentation.  Fia Hebert A. Felecia Shelling, MD, PhD, Charlynn Grimes  07/04/2022, 11:37 AM Director of the [place] at [facility] Certified in Neurology, Clinical Neurophysiology, Sleep Medicine, Pain Medicine and [facility] [address], [place], [place] [address] [phone]

## 2022-07-05 ENCOUNTER — Ambulatory Visit: Payer: Medicare Other | Admitting: Podiatry

## 2022-07-08 DIAGNOSIS — H353132 Nonexudative age-related macular degeneration, bilateral, intermediate dry stage: Secondary | ICD-10-CM | POA: Diagnosis not present

## 2022-07-15 ENCOUNTER — Ambulatory Visit (INDEPENDENT_AMBULATORY_CARE_PROVIDER_SITE_OTHER): Payer: Medicare Other | Admitting: Podiatry

## 2022-07-15 ENCOUNTER — Encounter: Payer: Self-pay | Admitting: Podiatry

## 2022-07-15 DIAGNOSIS — L89899 Pressure ulcer of other site, unspecified stage: Secondary | ICD-10-CM | POA: Diagnosis not present

## 2022-07-15 DIAGNOSIS — G35 Multiple sclerosis: Secondary | ICD-10-CM | POA: Diagnosis not present

## 2022-07-15 DIAGNOSIS — M79674 Pain in right toe(s): Secondary | ICD-10-CM

## 2022-07-15 DIAGNOSIS — M79675 Pain in left toe(s): Secondary | ICD-10-CM

## 2022-07-15 DIAGNOSIS — B351 Tinea unguium: Secondary | ICD-10-CM | POA: Diagnosis not present

## 2022-07-15 DIAGNOSIS — G629 Polyneuropathy, unspecified: Secondary | ICD-10-CM | POA: Diagnosis not present

## 2022-07-15 NOTE — Patient Instructions (Signed)
DRESSING CHANGES left lower extremity:   PHARMACY SHOPPING LIST: Saline or Wound Cleanser for cleaning wound 2 x 2 inch sterile gauze for cleaning wound triple antibiotic ointment  WEAR HEEL PROTECTORS AT ALL TIME LEFT FOOT  1. KEEP left lower extremity DRY AT ALL TIMES!!!!  2. CLEANSE ULCER WITH SALINE OR WOUND CLEANSER.  3. DAB DRY WITH GAUZE SPONGE.  4. APPLY A LIGHT AMOUNT OF Neosporin Cream TO BASE OF ULCER.  5. APPLY OUTER DRESSING AS INSTRUCTED.  6. WEAR SURGICAL SHOE/BOOT DAILY AT ALL TIMES. IF SUPPLIED, WEAR HEEL PROTECTORS AT ALL TIMES WHEN IN BED.  7. DO NOT WALK BAREFOOT!!!  8.  IF YOU EXPERIENCE ANY FEVER, CHILLS, NIGHTSWEATS, NAUSEA OR VOMITING, ELEVATED OR LOW BLOOD SUGARS, REPORT TO EMERGENCY ROOM.  9. IF YOU EXPERIENCE INCREASED REDNESS, PAIN, SWELLING, DISCOLORATION, ODOR, PUS, DRAINAGE OR WARMTH OF YOUR FOOT, REPORT TO EMERGENCY ROOM.

## 2022-07-21 NOTE — Progress Notes (Signed)
  Subjective:  Patient ID: [name], female    DOB: 10-04-1932,  MRN: [id number]  [name] presents to clinic today for at risk foot care with h/o multiple sclerosis and neuropathy and callus(es) left lower extremity and painful thick toenails that are difficult to trim. Painful toenails interfere with ambulation. Aggravating factors include wearing enclosed shoe gear. Pain is relieved with periodic professional debridement. Painful calluses are aggravated when weightbearing with and without shoegear. Pain is relieved with periodic professional debridement.  Patient is accompanied by her caregiver on today's visit. States her left foot feels tender. She is wearing her padded sandals on today's visit. She has not been wearing her heel protectors.  PCP is [name], MD , and last visit was  March 28, 2022  Allergies  Allergen Reactions   Sulfasalazine Anaphylaxis   Aspirin Nausea Only   Penicillins Diarrhea   Sulfa Antibiotics    Sulfamethoxazole-Trimethoprim Other (See Comments)    Review of Systems: Negative except as noted in the HPI.  Objective: No changes noted in today's physical examination.  [name] is a pleasant 88 y.o. year old Caucasian female  in NAD. AAO x 3. Sitting up in wheelchair.  Vascular Examination:  Capillary refill time to digits immediate b/l. Palpable DP pulses b/l. Faintly palpable PT pulses b/l. Pedal hair absent b/l Skin temperature gradient within normal limits b/l. Dependent rubor noted b/l. +2 pitting edema b/l LE left >right.  Dermatological Examination: Pedal skin with normal turgor, texture and tone bilaterally. No open wounds bilaterally. No interdigital macerations bilaterally. Toenails 1-5 b/l elongated, discolored, dystrophic, thickened, crumbly with subungual debris and tenderness to dorsal palpation.   She does have a linear pressure injury on  plantar aspect of her foot left submetatarsal head 5 with no erythema,  no edema, no blistering. Scant amount of blood noted. No purulence. Area is tender to palpation.  She does have tenderness to palpation on posterior aspect of her feet. No open wounds, no edema, no blistering, no drainage. There is tenderness to palpation.  Musculoskeletal: Hammertoes noted to the 1-5 bilaterally. Utilizes wheelchair for mobility assistance.  Left lower extremity with fixed inversion foot deformity. Foot drop LLE.  Neurological: Protective sensation intact 5/5 intact bilaterally with 10g monofilament b/l. Diminished vibratory sensation. Involuntary spasms noted b/l feet.  Assessment/Plan: 1. Pain due to onychomycosis of toenails of both feet   2. Pressure injury of skin of left foot, unspecified injury stage   3. Multiple sclerosis ([place])   4. Neuropathy      -Patient was evaluated and treated. All patient's and/or POA's questions/concerns answered on today's visit. -Examined patient. -Pressure injury cleansed with alcohol. Triple antibiotic ointment and band-aid applied. Caregiver instructed to apply Neosporin to area once daily. Start heel protector daily, even when in transport chair. She related understanding. -Mycotic toenails 1-5 bilaterally were debrided in length and girth with sterile nail nippers and dremel without incident. -Patient/POA to call should there be question/concern in the interim.  -Follow up in 3 weeks for left pressure injury left foot.  Return in about 3 weeks (around 08/05/2022).  Marzetta Board, DPM

## 2022-07-22 IMAGING — MR MR HEAD W/O CM
10 series · 48 of 48 positions shown · non-contrast
Comparison: 7510

CLINICAL DATA: Difficulty walking with weakness, vision problems,
history of multiple sclerosis

EXAM:
MRI HEAD WITHOUT CONTRAST
TECHNIQUE: Multiplanar, multiecho pulse sequences of the brain and surrounding
structures were obtained without intravenous contrast.

[Series 2: T1 · sagittal · 5.0mm · 0.45mm/px · 3 of 25 slices shown]
[im 1/25]
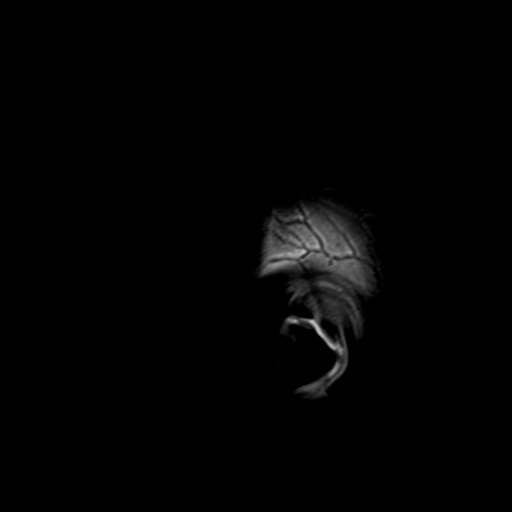
[im 13/25]
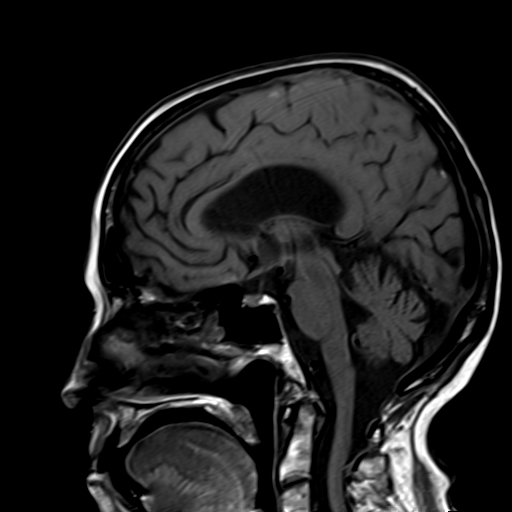
[im 25/25]
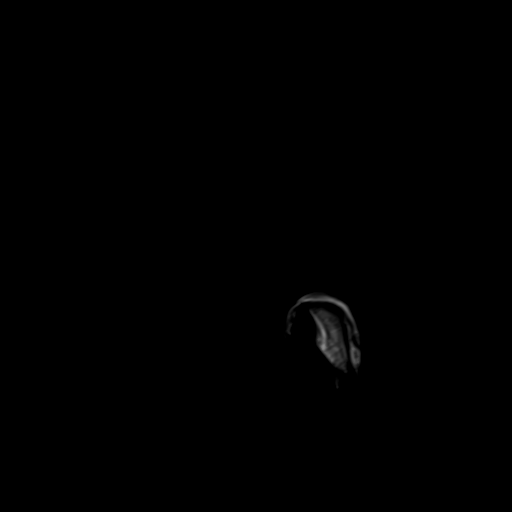

[Series 3: FLAIR · sagittal · 3.0mm · 0.45mm/px · 3 of 39 slices shown (1 of 2)]
[im 1/39]
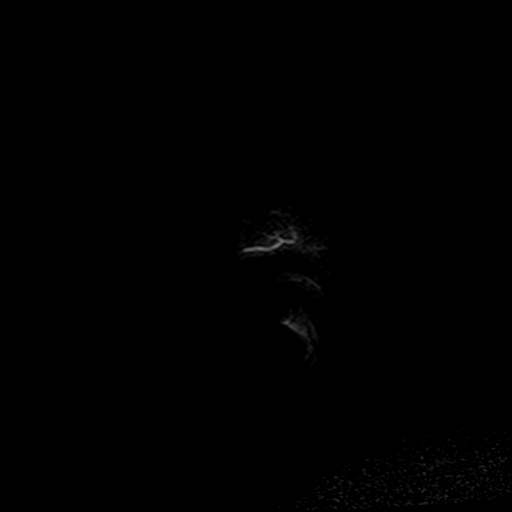
[im 20/39]
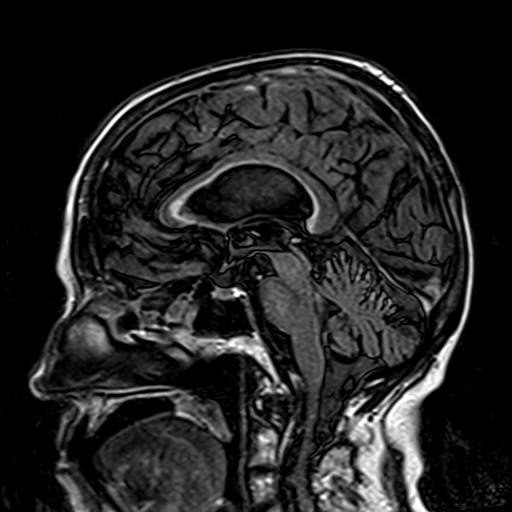
[im 39/39]
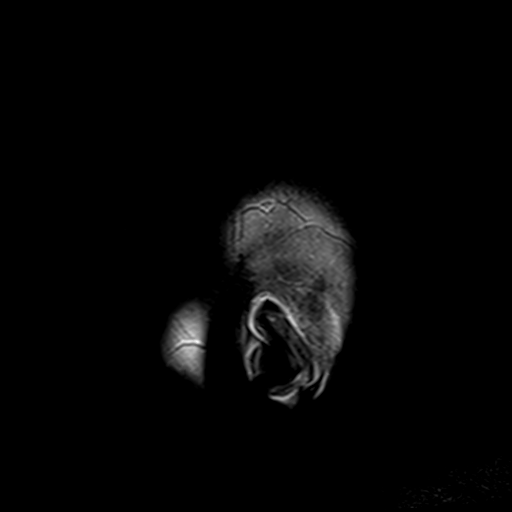

[Series 4: ax ep2d_diff_3 · axial · 3.0mm · 1.80mm/px · z∈[-47,+115]mm · 8 of 106 slices shown]
[im 1/106]
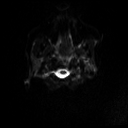
[im 16/106]
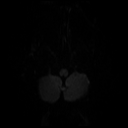
[im 31/106]
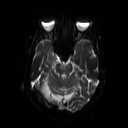
[im 46/106]
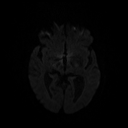
[im 61/106]
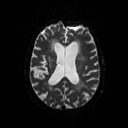
[im 76/106]
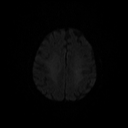
[im 91/106]
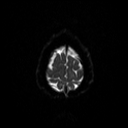
[im 106/106]
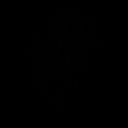

[Series 5: ax ep2d_diff_3_adc · axial · 3.0mm · 1.80mm/px · z∈[-47,+115]mm · 4 of 55 slices shown]
[im 1/55]
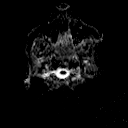
[im 19/55]
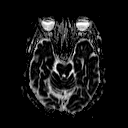
[im 37/55]
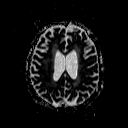
[im 55/55]
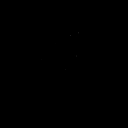

[Series 6: cor ep2d_diff · coronal · 5.0mm · 1.77mm/px · 4 of 57 slices shown]
[im 1/57]
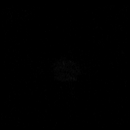
[im 19/57]
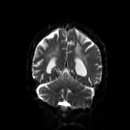
[im 38/57]
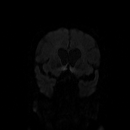
[im 57/57]
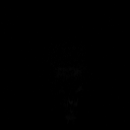

[Series 7: cor ep2d_diff_adc · coronal · 5.0mm · 1.77mm/px · 2 of 29 slices shown]
[im 1/29]
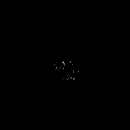
[im 29/29]
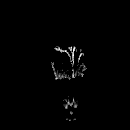

[Series 9: swi_images · axial · 2.0mm · 0.98mm/px · z∈[-39,+119]mm · 6 of 80 slices shown]
[im 1/80]
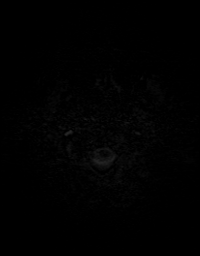
[im 16/80]
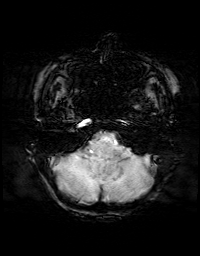
[im 32/80]
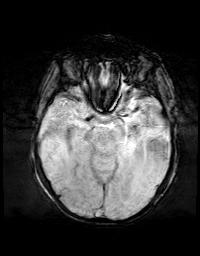
[im 48/80]
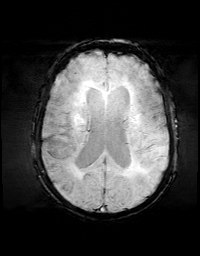
[im 64/80]
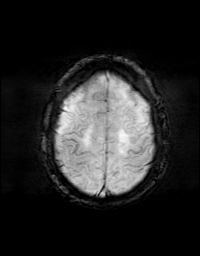
[im 80/80]
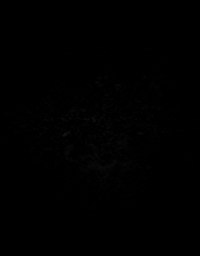

[Series 10: FLAIR · axial · 3.0mm · 0.43mm/px · z∈[-39,+121]mm · 3 of 42 slices shown (2 of 2)]
[im 1/42]
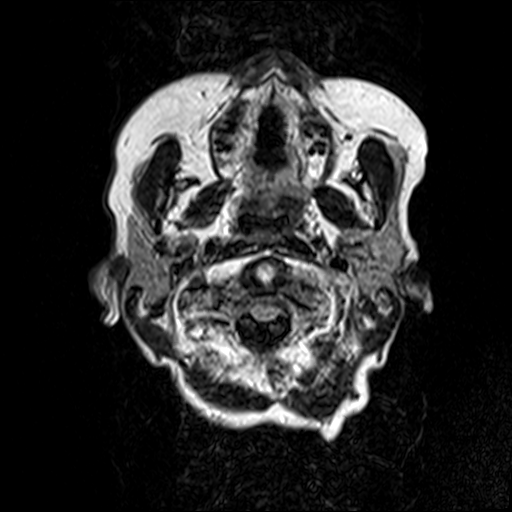
[im 21/42]
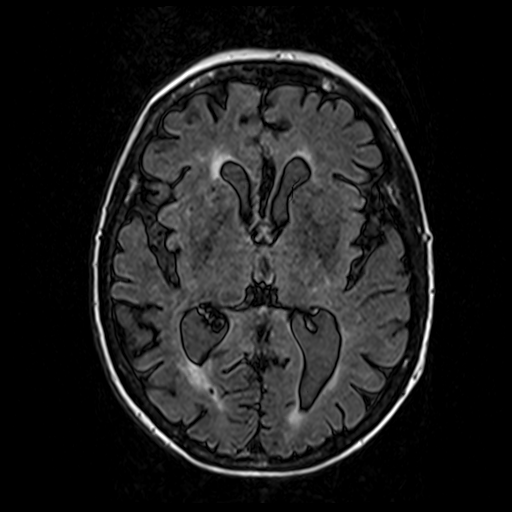
[im 42/42]
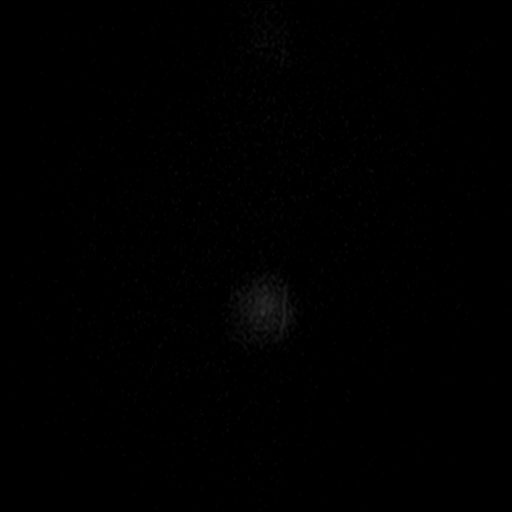

[Series 11: T2 · axial · 5.0mm · 0.78mm/px · z∈[-41,+121]mm · 2 of 28 slices shown]
[im 1/28]
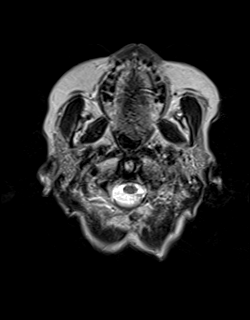
[im 28/28]
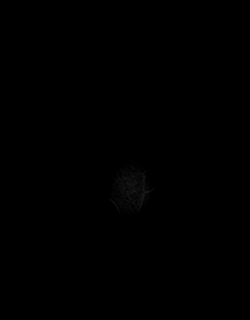

[Series 13: t1_mpr_tra · axial · 1.0mm · 0.90mm/px · z∈[-57,+117]mm · 13 of 176 slices shown]
[im 1/176]
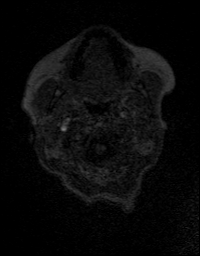
[im 15/176]
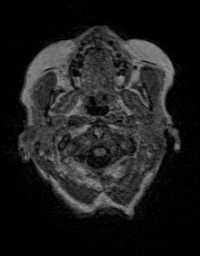
[im 30/176]
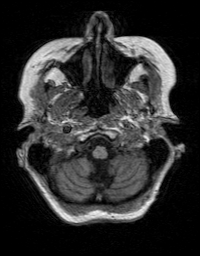
[im 44/176]
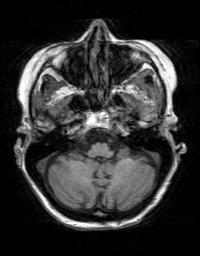
[im 59/176]
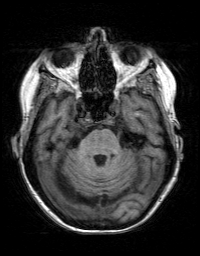
[im 73/176]
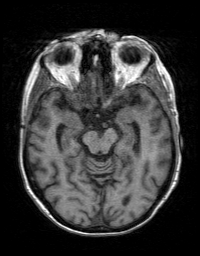
[im 88/176]
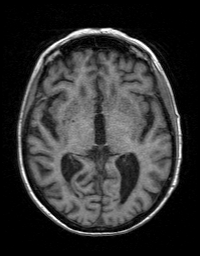
[im 103/176]
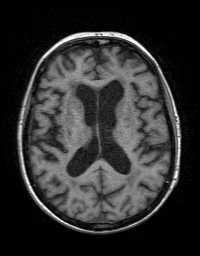
[im 117/176]
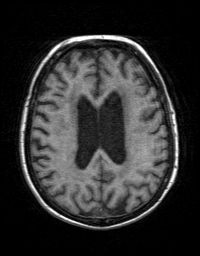
[im 132/176]
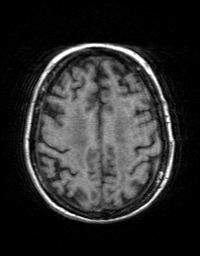
[im 146/176]
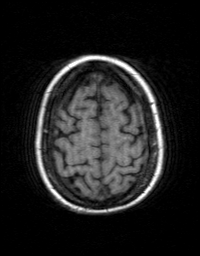
[im 161/176]
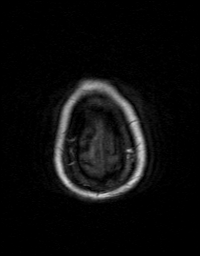
[im 176/176]
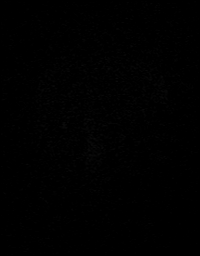

[48 of 48 positions shown; findings below may reference images not displayed]

FINDINGS: Motion artifact is present.

Brain: There is no acute infarction or intracranial hemorrhage.

Patchy and confluent areas of T2 hyperintensity in the
supratentorial and pontine white matter probably reflect combination
of chronic demyelination and chronic microvascular ischemic changes.
This has progressed since the remote 7510 study. Prominence of the
ventricles and sulci reflects generalized parenchymal volume loss
that has also progressed. Mild relative prominence of the ventricles
is likely on an ex vacuo basis. Punctate focus of susceptibility in
the left frontal white matter is most compatible with chronic
microhemorrhage.

There is no intracranial mass, mass effect, or edema. There is no
hydrocephalus or extra-axial fluid collection.

Vascular: Major vessel flow voids at the skull base are preserved.

Skull and upper cervical spine: Normal marrow signal is preserved.

Sinuses/Orbits: Mild paranasal sinus mucosal thickening. Bilateral
lens replacements.

Other: Sella is unremarkable.  Mastoid air cells are clear.
IMPRESSION: No evidence of recent infarction, hemorrhage, or mass.

Moderate burden of white matter changes probably reflecting a
combination of chronic demyelination and chronic microvascular
ischemic changes.

## 2022-07-24 ENCOUNTER — Telehealth: Payer: Self-pay

## 2022-07-24 NOTE — Telephone Encounter (Signed)
Patient notified and verbalized understanding. 

## 2022-07-24 NOTE — Telephone Encounter (Signed)
Patient calling regarding Prolia and when she can start injection.  I don't see a VOB for medication.

## 2022-07-26 DIAGNOSIS — H35371 Puckering of macula, right eye: Secondary | ICD-10-CM | POA: Diagnosis not present

## 2022-07-26 DIAGNOSIS — H3540 Unspecified peripheral retinal degeneration: Secondary | ICD-10-CM | POA: Diagnosis not present

## 2022-07-26 DIAGNOSIS — H43813 Vitreous degeneration, bilateral: Secondary | ICD-10-CM | POA: Diagnosis not present

## 2022-07-26 DIAGNOSIS — H353132 Nonexudative age-related macular degeneration, bilateral, intermediate dry stage: Secondary | ICD-10-CM | POA: Diagnosis not present

## 2022-07-31 ENCOUNTER — Encounter: Payer: Self-pay | Admitting: Podiatry

## 2022-07-31 ENCOUNTER — Ambulatory Visit (INDEPENDENT_AMBULATORY_CARE_PROVIDER_SITE_OTHER): Payer: Medicare Other | Admitting: Podiatry

## 2022-07-31 DIAGNOSIS — L89899 Pressure ulcer of other site, unspecified stage: Secondary | ICD-10-CM | POA: Diagnosis not present

## 2022-07-31 DIAGNOSIS — G629 Polyneuropathy, unspecified: Secondary | ICD-10-CM

## 2022-07-31 NOTE — Progress Notes (Signed)
  Subjective:  Patient ID: [name], female    DOB: [birth year],   MRN: [id number]  No chief complaint on file.   88 y.o. female presents for concern of pressure injury on left foot. Was seen by Dr. Elisha Ponder two weeks ago and was told follow-up with me. Relates she has been keeping neosporin and a bandaid over the area.   . Denies any other pedal complaints. Denies n/v/f/c.   Past Medical History:  Diagnosis Date   Chronic venous insufficiency of lower extremity 01/25/2019   With chronic bilateral lower extremity edema   Incontinent of urine    Multiple sclerosis ([place]) 2015   Osteoporosis    osteopenia   Polyarthritis    Post-menopausal    Rheumatoid arthritis ([place])    Temporal arteritis ([place]) 2011    Objective:  Physical Exam: Vascular: DP/PT pulses 2/4 bilateral. CFT <3 seconds. Normal hair growth on digits. No edema.  Skin. No lacerations or abrasions bilateral feet. No ulceration noted upon debridement of fifth metatrsal left hyperkeratosis.  Musculoskeletal: MMT 4/5 bilateral lower extremities in DF, PF, Inversion and Eversion. Deceased ROM in DF of ankle joint. Wheel chair bound with history of MS.  Neurological: Sensation intact to light touch. Protective sensation diminished bilateral.   Assessment:   1. Pressure injury of skin of left foot, unspecified injury stage   2. Neuropathy      Plan:  Patient was evaluated and treated and all questions answered. Pressure injury left foot no ulceration noted.  -Debridement of hyperkeratotic tissue mnimally. . -Dressed with bandaid -Advised patient to keep with the heel protectors and keep a bandaid over the area for protections.  -No abx indicated.  -Discussed glucose control and proper protein-rich diet.  -Discussed if any worsening redness, pain, fever or chills to call or may need to report to the emergency room. Patient expressed understanding.     No follow-ups on file.   Lorenda Peck, DPM

## 2022-08-07 DIAGNOSIS — H353132 Nonexudative age-related macular degeneration, bilateral, intermediate dry stage: Secondary | ICD-10-CM | POA: Diagnosis not present

## 2022-08-27 ENCOUNTER — Other Ambulatory Visit: Payer: Self-pay | Admitting: Neurology

## 2022-09-06 DIAGNOSIS — H353132 Nonexudative age-related macular degeneration, bilateral, intermediate dry stage: Secondary | ICD-10-CM | POA: Diagnosis not present

## 2022-09-12 DIAGNOSIS — E7800 Pure hypercholesterolemia, unspecified: Secondary | ICD-10-CM | POA: Diagnosis not present

## 2022-09-12 DIAGNOSIS — E039 Hypothyroidism, unspecified: Secondary | ICD-10-CM | POA: Diagnosis not present

## 2022-09-12 DIAGNOSIS — Z1331 Encounter for screening for depression: Secondary | ICD-10-CM | POA: Diagnosis not present

## 2022-09-12 DIAGNOSIS — E871 Hypo-osmolality and hyponatremia: Secondary | ICD-10-CM | POA: Diagnosis not present

## 2022-09-12 DIAGNOSIS — Z8673 Personal history of transient ischemic attack (TIA), and cerebral infarction without residual deficits: Secondary | ICD-10-CM | POA: Diagnosis not present

## 2022-09-12 DIAGNOSIS — G35 Multiple sclerosis: Secondary | ICD-10-CM | POA: Diagnosis not present

## 2022-09-12 DIAGNOSIS — M810 Age-related osteoporosis without current pathological fracture: Secondary | ICD-10-CM | POA: Diagnosis not present

## 2022-09-12 DIAGNOSIS — Z0000 Encounter for general adult medical examination without abnormal findings: Secondary | ICD-10-CM | POA: Diagnosis not present

## 2022-09-12 DIAGNOSIS — Z23 Encounter for immunization: Secondary | ICD-10-CM | POA: Diagnosis not present

## 2022-09-12 DIAGNOSIS — R609 Edema, unspecified: Secondary | ICD-10-CM | POA: Diagnosis not present

## 2022-09-12 DIAGNOSIS — I6930 Unspecified sequelae of cerebral infarction: Secondary | ICD-10-CM | POA: Diagnosis not present

## 2022-09-15 ENCOUNTER — Other Ambulatory Visit: Payer: Self-pay | Admitting: Neurology

## 2022-09-17 DIAGNOSIS — Z961 Presence of intraocular lens: Secondary | ICD-10-CM | POA: Diagnosis not present

## 2022-09-17 DIAGNOSIS — H353132 Nonexudative age-related macular degeneration, bilateral, intermediate dry stage: Secondary | ICD-10-CM | POA: Diagnosis not present

## 2022-09-20 ENCOUNTER — Other Ambulatory Visit: Payer: Self-pay | Admitting: Neurology

## 2022-09-23 ENCOUNTER — Telehealth: Payer: Self-pay | Admitting: Neurology

## 2022-09-23 NOTE — Telephone Encounter (Signed)
Pt is calling. Stated she wants to talk to a nurse about having some type of surgery. Pt is requesting a call back from nurse.
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
## 2022-10-14 ENCOUNTER — Other Ambulatory Visit: Payer: Self-pay | Admitting: Neurology

## 2022-10-15 ENCOUNTER — Telehealth: Payer: Self-pay | Admitting: Plastic Surgery

## 2022-10-15 NOTE — Telephone Encounter (Signed)
I spoke with both the patient, [name], and her daughter on [name] on 10/15/2022. They tell me that she has a nonhealing wound on her ear for which she was going to come into clinic for evaluation. However, she tells me that she discussed this with her daughter and they are no longer interested in coming in because she already has "many doctor visits".  She requires special transportation and it is taxing on her going to various appointments.  Furthermore, they feel as though it has started to make good progress with regard to wound healing.   I informed patient that it is entirely up to her and we will remain available to her should she change her mind and decide to come in for evaluation.  I also strongly encouraged her to follow-up with her primary care provider regarding their decision and for ongoing wound management.

## 2022-10-15 NOTE — Telephone Encounter (Signed)
Please give dtr, [name], a call asap regarding upcoming appt on Thursday 10/17/22.  Mother is 88 years old and has difficulty getting around and does not want to put more on her if she doesn't need to.

## 2022-10-17 ENCOUNTER — Institutional Professional Consult (permissible substitution): Payer: Medicare Other | Admitting: Plastic Surgery

## 2022-11-04 ENCOUNTER — Other Ambulatory Visit: Payer: Self-pay | Admitting: Neurology

## 2022-11-19 ENCOUNTER — Encounter: Payer: Self-pay | Admitting: Podiatry

## 2022-11-19 ENCOUNTER — Ambulatory Visit (INDEPENDENT_AMBULATORY_CARE_PROVIDER_SITE_OTHER): Payer: Medicare Other | Admitting: Podiatry

## 2022-11-19 VITALS — BP 143/69

## 2022-11-19 DIAGNOSIS — G35 Multiple sclerosis: Secondary | ICD-10-CM

## 2022-11-19 DIAGNOSIS — L84 Corns and callosities: Secondary | ICD-10-CM

## 2022-11-19 DIAGNOSIS — B351 Tinea unguium: Secondary | ICD-10-CM | POA: Diagnosis not present

## 2022-11-19 DIAGNOSIS — G629 Polyneuropathy, unspecified: Secondary | ICD-10-CM

## 2022-11-19 DIAGNOSIS — L89899 Pressure ulcer of other site, unspecified stage: Secondary | ICD-10-CM | POA: Diagnosis not present

## 2022-11-19 DIAGNOSIS — M79674 Pain in right toe(s): Secondary | ICD-10-CM | POA: Diagnosis not present

## 2022-11-19 DIAGNOSIS — M79675 Pain in left toe(s): Secondary | ICD-10-CM

## 2022-11-19 NOTE — Patient Instructions (Signed)
[employer]:  PLEASE APPLY GEL PAD TO ENTIRE FOOT PLATE OF MOTORIZED CHAIR. PATIENT IS GETTING PRESSURE SORE ON HER LEFT FOOT.

## 2022-11-23 DIAGNOSIS — H353132 Nonexudative age-related macular degeneration, bilateral, intermediate dry stage: Secondary | ICD-10-CM | POA: Diagnosis not present

## 2022-11-24 NOTE — Progress Notes (Signed)
  Subjective:  Patient ID: [name], female    DOB: 09-28-1932,  MRN: [id number]  [name] presents to clinic today for at risk foot care with h/o multiple sclerosis and neuropathy and callus(es) left lower extremity and painful thick toenails that are difficult to trim. Painful toenails interfere with ambulation. Aggravating factors include wearing enclosed shoe gear. Pain is relieved with periodic professional debridement. Painful calluses are aggravated when weightbearing with and without shoegear. Pain is relieved with periodic professional debridement.  Chief Complaint  Patient presents with   Nail Problem    RFC PCP-Ehinger, Robert PCP VST- 09/2022   New problem(s): None.   She is excited to be seeing her family for the upcoming holiday. She is accompanied by her caregiver who waited in the lobby.  PCP is [name], MD.  Allergies  Allergen Reactions   Sulfasalazine Anaphylaxis   Aspirin Nausea Only   Penicillins Diarrhea   Sulfa Antibiotics    Sulfamethoxazole-Trimethoprim Other (See Comments)    Review of Systems: Negative except as noted in the HPI.  Objective:  Vitals:   11/19/22 1626  BP: (!) 143/69   [name] is a pleasant 88 y.o. female  frail, in NAD. AAO x 3. Using motorized scooter on her own.  Vascular Examination:  Capillary refill time to digits immediate b/l. Palpable DP pulses b/l. Faintly palpable PT pulses b/l. Pedal hair absent b/l Skin temperature gradient within normal limits b/l. Dependent rubor noted b/l. +2 pitting edema b/l LE left >right.  Dermatological Examination: Pedal skin with normal turgor, texture and tone bilaterally. No open wounds bilaterally. No interdigital macerations bilaterally. Toenails 1-5 b/l elongated, discolored, dystrophic, thickened, crumbly with subungual debris and tenderness to dorsal palpation.   She does small hyperkeratotic lesion left submetatarsal head 5 with no  erythema, no edema, no blistering. No purulence. Area is tender to palpation.  She does have tenderness to palpation on posterior aspect of her feet. No open wounds, no edema, no blistering, no drainage. There is tenderness to palpation.  Musculoskeletal: Hammertoes noted to the 1-5 bilaterally. Utilizes motorized chair independently for mobility assistance.  Left lower extremity with fixed inversion foot deformity. Foot drop LLE.  Neurological: Protective sensation intact 5/5 intact bilaterally with 10g monofilament b/l. Diminished vibratory sensation. Involuntary spasms noted b/l feet.  Assessment/Plan: 1. Pain due to onychomycosis of toenails of both feet   2. Callus   3. Pressure injury of skin of left foot, unspecified injury stage   4. Neuropathy   5. Multiple sclerosis ([name])     No orders of the defined types were placed in this encounter.  -Patient was evaluated and treated. All patient's and/or POA's questions/concerns answered on today's visit. -Continue heel protector left foot for pressure relief left foot. -We discussed continued need for pressure relief of lateral forefoot of LLE to prevent pressure injury. Wrote for [employer] to apply gel pad to the entire foot plate of her motorized chair. She is to take this to the chair vendor for modification. Patient related understanding . Verbalized today's events to caregiver and she related understanding. -Toenails 1-5 b/l were debrided in length and girth with sterile nail nippers and dremel without iatrogenic bleeding.  -Callus(es) submet head 5 left foot pared utilizing rotary bur without complication or incident. Total number pared =1. -Patient/POA to call should there be question/concern in the interim.   Return in about 3 months (around 02/18/2023).  Marzetta Board, DPM

## 2022-11-27 DIAGNOSIS — H7291 Unspecified perforation of tympanic membrane, right ear: Secondary | ICD-10-CM | POA: Diagnosis not present

## 2022-11-27 DIAGNOSIS — H903 Sensorineural hearing loss, bilateral: Secondary | ICD-10-CM | POA: Insufficient documentation

## 2022-11-27 DIAGNOSIS — H938X3 Other specified disorders of ear, bilateral: Secondary | ICD-10-CM | POA: Diagnosis not present

## 2022-12-01 ENCOUNTER — Other Ambulatory Visit: Payer: Self-pay | Admitting: Neurology

## 2022-12-05 ENCOUNTER — Ambulatory Visit: Payer: Medicare Other | Admitting: Internal Medicine

## 2022-12-23 DIAGNOSIS — H353132 Nonexudative age-related macular degeneration, bilateral, intermediate dry stage: Secondary | ICD-10-CM | POA: Diagnosis not present

## 2022-12-30 ENCOUNTER — Other Ambulatory Visit: Payer: Self-pay | Admitting: Neurology

## 2023-01-01 DIAGNOSIS — I639 Cerebral infarction, unspecified: Secondary | ICD-10-CM | POA: Diagnosis not present

## 2023-01-01 DIAGNOSIS — G35 Multiple sclerosis: Secondary | ICD-10-CM | POA: Diagnosis not present

## 2023-01-28 DIAGNOSIS — H353132 Nonexudative age-related macular degeneration, bilateral, intermediate dry stage: Secondary | ICD-10-CM | POA: Diagnosis not present

## 2023-01-28 DIAGNOSIS — H43813 Vitreous degeneration, bilateral: Secondary | ICD-10-CM | POA: Diagnosis not present

## 2023-01-28 DIAGNOSIS — H35371 Puckering of macula, right eye: Secondary | ICD-10-CM | POA: Diagnosis not present

## 2023-03-05 ENCOUNTER — Ambulatory Visit (INDEPENDENT_AMBULATORY_CARE_PROVIDER_SITE_OTHER): Payer: Medicare Other | Admitting: Podiatrist

## 2023-03-05 ENCOUNTER — Ambulatory Visit: Payer: Medicare Other | Admitting: Podiatry

## 2023-03-05 DIAGNOSIS — G629 Polyneuropathy, unspecified: Secondary | ICD-10-CM

## 2023-03-05 DIAGNOSIS — B351 Tinea unguium: Secondary | ICD-10-CM

## 2023-03-05 DIAGNOSIS — L84 Corns and callosities: Secondary | ICD-10-CM

## 2023-03-05 DIAGNOSIS — M79674 Pain in right toe(s): Secondary | ICD-10-CM

## 2023-03-05 DIAGNOSIS — M79675 Pain in left toe(s): Secondary | ICD-10-CM | POA: Diagnosis not present

## 2023-03-05 NOTE — Progress Notes (Signed)
  Subjective:  Patient ID: [name], female    DOB: 11-27-1932,  MRN: [id number]  [name] presents to clinic today for at risk foot care with h/o multiple sclerosis and neuropathy and callus(es) left lower extremity and painful thick toenails that are difficult to trim. Painful toenails interfere with ambulation. Aggravating factors include wearing enclosed shoe gear. Pain is relieved with periodic professional debridement. Painful calluses are aggravated when weightbearing with and without shoegear. Pain is relieved with periodic professional debridement.  Chief Complaint  Patient presents with   Nail Problem    RFC PCP-Ehinger PCP VST-09/2022   New problem(s): None.   PCP is [name], MD.  Allergies  Allergen Reactions   Sulfasalazine Anaphylaxis   Aspirin Nausea Only   Penicillins Diarrhea   Sulfa Antibiotics    Sulfamethoxazole-Trimethoprim Other (See Comments)    Review of Systems: Negative except as noted in the HPI.  Objective:  There were no vitals filed for this visit.  [name] is a pleasant 89 y.o. female  frail, in NAD. AAO x 3. Using motorized scooter on her own.  Vascular Examination:  Capillary refill time to digits immediate b/l. Palpable DP pulses b/l. Faintly palpable PT pulses b/l. Pedal hair absent b/l Skin temperature gradient within normal limits b/l. Dependent rubor noted b/l. +2 pitting edema b/l LE left >right.  Dermatological Examination: Pedal skin with normal turgor, texture and tone bilaterally. No open wounds bilaterally. No interdigital macerations bilaterally. Toenails 1-5 b/l elongated, discolored, dystrophic, thickened, crumbly with subungual debris and tenderness to dorsal palpation.   She does have tenderness to palpation on posterior aspect of her feet. No open wounds, no edema, no blistering, no drainage. She relates the offloading boots are helping her - she wears this on the left.    Musculoskeletal: Hammertoes noted to the 1-5 bilaterally. Utilizes motorized chair independently for mobility assistance.  Left lower extremity with fixed inversion foot deformity. Foot drop LLE.  Neurological: Protective sensation intact 5/5 intact bilaterally with 10g monofilament b/l. Diminished vibratory sensation. Involuntary spasms noted b/l feet.  Assessment/Plan:   ICD-10-CM   1. Pain due to onychomycosis of toenails of both feet  B35.1    M79.675    M79.674     2. Neuropathy  G62.9     3. Callus  L84       -Patient was evaluated and treated. All patient's and/or POA's questions/concerns answered on today's visit. -Continue heel protector left foot for pressure relief left foot.  -Toenails 1-5 b/l were debrided in length and girth with sterile nail nippers and dremel without iatrogenic bleeding.  -Callus(es) submet head 5 left foot pared utilizing rotary bur without complication or incident. Total number pared =1. -Patient/POA to call should there be question/concern in the interim.     [name], DPM

## 2023-03-05 NOTE — Patient Instructions (Signed)
You have a small superficial scratch on your right second toe on the top.  Apply a small dab of polysporin or first aid antibiotic ointment or cream to the toe.  You do not need to cover with a bandaid.  Watch the area for any increased redness, swelling or drainage and call our office if you  notice any of these signs.  Treat the toe daily for about 3-4 weeks or until healed.

## 2023-03-13 DIAGNOSIS — Z8673 Personal history of transient ischemic attack (TIA), and cerebral infarction without residual deficits: Secondary | ICD-10-CM | POA: Diagnosis not present

## 2023-03-13 DIAGNOSIS — M810 Age-related osteoporosis without current pathological fracture: Secondary | ICD-10-CM | POA: Diagnosis not present

## 2023-03-13 DIAGNOSIS — R609 Edema, unspecified: Secondary | ICD-10-CM | POA: Diagnosis not present

## 2023-03-13 DIAGNOSIS — E039 Hypothyroidism, unspecified: Secondary | ICD-10-CM | POA: Diagnosis not present

## 2023-03-13 DIAGNOSIS — R54 Age-related physical debility: Secondary | ICD-10-CM | POA: Diagnosis not present

## 2023-03-13 DIAGNOSIS — G35 Multiple sclerosis: Secondary | ICD-10-CM | POA: Diagnosis not present

## 2023-03-13 DIAGNOSIS — M1990 Unspecified osteoarthritis, unspecified site: Secondary | ICD-10-CM | POA: Diagnosis not present

## 2023-03-13 DIAGNOSIS — E871 Hypo-osmolality and hyponatremia: Secondary | ICD-10-CM | POA: Diagnosis not present

## 2023-03-13 DIAGNOSIS — E7800 Pure hypercholesterolemia, unspecified: Secondary | ICD-10-CM | POA: Diagnosis not present

## 2023-03-13 DIAGNOSIS — I6930 Unspecified sequelae of cerebral infarction: Secondary | ICD-10-CM | POA: Diagnosis not present

## 2023-03-26 ENCOUNTER — Inpatient Hospital Stay (HOSPITAL_COMMUNITY): Payer: Medicare Other

## 2023-03-26 ENCOUNTER — Emergency Department (HOSPITAL_COMMUNITY): Payer: Medicare Other

## 2023-03-26 ENCOUNTER — Inpatient Hospital Stay (HOSPITAL_COMMUNITY)
Admission: EM | Admit: 2023-03-26 | Discharge: 2023-03-29 | DRG: 065 | Disposition: A | Discharge status: Home Health | Payer: Medicare Other | Attending: Family Medicine | Admitting: Family Medicine

## 2023-03-26 ENCOUNTER — Other Ambulatory Visit: Payer: Self-pay

## 2023-03-26 DIAGNOSIS — E871 Hypo-osmolality and hyponatremia: Secondary | ICD-10-CM | POA: Diagnosis present

## 2023-03-26 DIAGNOSIS — I610 Nontraumatic intracerebral hemorrhage in hemisphere, subcortical: Secondary | ICD-10-CM | POA: Diagnosis present

## 2023-03-26 DIAGNOSIS — M069 Rheumatoid arthritis, unspecified: Secondary | ICD-10-CM | POA: Diagnosis present

## 2023-03-26 DIAGNOSIS — Z993 Dependence on wheelchair: Secondary | ICD-10-CM | POA: Diagnosis not present

## 2023-03-26 DIAGNOSIS — W19XXXS Unspecified fall, sequela: Secondary | ICD-10-CM | POA: Diagnosis present

## 2023-03-26 DIAGNOSIS — R29712 NIHSS score 12: Secondary | ICD-10-CM | POA: Diagnosis present

## 2023-03-26 DIAGNOSIS — M810 Age-related osteoporosis without current pathological fracture: Secondary | ICD-10-CM | POA: Diagnosis present

## 2023-03-26 DIAGNOSIS — S32409S Unspecified fracture of unspecified acetabulum, sequela: Secondary | ICD-10-CM

## 2023-03-26 DIAGNOSIS — I612 Nontraumatic intracerebral hemorrhage in hemisphere, unspecified: Secondary | ICD-10-CM | POA: Diagnosis not present

## 2023-03-26 DIAGNOSIS — Z79899 Other long term (current) drug therapy: Secondary | ICD-10-CM | POA: Diagnosis not present

## 2023-03-26 DIAGNOSIS — Z66 Do not resuscitate: Secondary | ICD-10-CM | POA: Diagnosis present

## 2023-03-26 DIAGNOSIS — I161 Hypertensive emergency: Secondary | ICD-10-CM | POA: Diagnosis present

## 2023-03-26 DIAGNOSIS — R29810 Facial weakness: Secondary | ICD-10-CM | POA: Diagnosis not present

## 2023-03-26 DIAGNOSIS — I618 Other nontraumatic intracerebral hemorrhage: Secondary | ICD-10-CM | POA: Diagnosis not present

## 2023-03-26 DIAGNOSIS — R32 Unspecified urinary incontinence: Secondary | ICD-10-CM | POA: Diagnosis present

## 2023-03-26 DIAGNOSIS — Z888 Allergy status to other drugs, medicaments and biological substances status: Secondary | ICD-10-CM

## 2023-03-26 DIAGNOSIS — S32401S Unspecified fracture of right acetabulum, sequela: Secondary | ICD-10-CM | POA: Diagnosis not present

## 2023-03-26 DIAGNOSIS — M7989 Other specified soft tissue disorders: Secondary | ICD-10-CM | POA: Diagnosis present

## 2023-03-26 DIAGNOSIS — G35 Multiple sclerosis: Secondary | ICD-10-CM | POA: Diagnosis present

## 2023-03-26 DIAGNOSIS — I6389 Other cerebral infarction: Secondary | ICD-10-CM | POA: Diagnosis not present

## 2023-03-26 DIAGNOSIS — Z7982 Long term (current) use of aspirin: Secondary | ICD-10-CM

## 2023-03-26 DIAGNOSIS — Z820 Family history of epilepsy and other diseases of the nervous system: Secondary | ICD-10-CM | POA: Diagnosis not present

## 2023-03-26 DIAGNOSIS — Z7189 Other specified counseling: Secondary | ICD-10-CM | POA: Diagnosis not present

## 2023-03-26 DIAGNOSIS — Z823 Family history of stroke: Secondary | ICD-10-CM | POA: Diagnosis not present

## 2023-03-26 DIAGNOSIS — M130 Polyarthritis, unspecified: Secondary | ICD-10-CM | POA: Diagnosis present

## 2023-03-26 DIAGNOSIS — R29709 NIHSS score 9: Secondary | ICD-10-CM | POA: Diagnosis not present

## 2023-03-26 DIAGNOSIS — I619 Nontraumatic intracerebral hemorrhage, unspecified: Principal | ICD-10-CM

## 2023-03-26 DIAGNOSIS — I872 Venous insufficiency (chronic) (peripheral): Secondary | ICD-10-CM | POA: Diagnosis present

## 2023-03-26 DIAGNOSIS — I69354 Hemiplegia and hemiparesis following cerebral infarction affecting left non-dominant side: Secondary | ICD-10-CM

## 2023-03-26 DIAGNOSIS — Z886 Allergy status to analgesic agent status: Secondary | ICD-10-CM

## 2023-03-26 DIAGNOSIS — Z8249 Family history of ischemic heart disease and other diseases of the circulatory system: Secondary | ICD-10-CM

## 2023-03-26 DIAGNOSIS — S32409A Unspecified fracture of unspecified acetabulum, initial encounter for closed fracture: Secondary | ICD-10-CM | POA: Diagnosis present

## 2023-03-26 DIAGNOSIS — E7800 Pure hypercholesterolemia, unspecified: Secondary | ICD-10-CM | POA: Diagnosis not present

## 2023-03-26 DIAGNOSIS — I10 Essential (primary) hypertension: Secondary | ICD-10-CM | POA: Diagnosis not present

## 2023-03-26 DIAGNOSIS — Z79890 Hormone replacement therapy (postmenopausal): Secondary | ICD-10-CM

## 2023-03-26 DIAGNOSIS — E039 Hypothyroidism, unspecified: Secondary | ICD-10-CM | POA: Diagnosis not present

## 2023-03-26 DIAGNOSIS — Z7401 Bed confinement status: Secondary | ICD-10-CM | POA: Diagnosis not present

## 2023-03-26 DIAGNOSIS — I6381 Other cerebral infarction due to occlusion or stenosis of small artery: Secondary | ICD-10-CM | POA: Diagnosis not present

## 2023-03-26 DIAGNOSIS — Z882 Allergy status to sulfonamides status: Secondary | ICD-10-CM

## 2023-03-26 DIAGNOSIS — I639 Cerebral infarction, unspecified: Secondary | ICD-10-CM | POA: Diagnosis not present

## 2023-03-26 DIAGNOSIS — G8191 Hemiplegia, unspecified affecting right dominant side: Secondary | ICD-10-CM | POA: Diagnosis not present

## 2023-03-26 DIAGNOSIS — Z515 Encounter for palliative care: Secondary | ICD-10-CM | POA: Diagnosis not present

## 2023-03-26 DIAGNOSIS — Z880 Allergy status to penicillin: Secondary | ICD-10-CM

## 2023-03-26 DIAGNOSIS — R531 Weakness: Secondary | ICD-10-CM | POA: Diagnosis not present

## 2023-03-26 DIAGNOSIS — S72001S Fracture of unspecified part of neck of right femur, sequela: Secondary | ICD-10-CM

## 2023-03-26 DIAGNOSIS — M25551 Pain in right hip: Secondary | ICD-10-CM | POA: Diagnosis not present

## 2023-03-26 LAB — I-STAT CHEM 8, ED
BUN: 18 mg/dL (ref 8–23)
BUN: 22 mg/dL (ref 8–23)
Calcium, Ion: 0.99 mmol/L — ABNORMAL LOW (ref 1.15–1.40)
Calcium, Ion: 1.04 mmol/L — ABNORMAL LOW (ref 1.15–1.40)
Chloride: 101 mmol/L (ref 98–111)
Chloride: 104 mmol/L (ref 98–111)
Creatinine, Ser: 0.5 mg/dL (ref 0.44–1.00)
Creatinine, Ser: 0.5 mg/dL (ref 0.44–1.00)
Glucose, Bld: 107 mg/dL — ABNORMAL HIGH (ref 70–99)
Glucose, Bld: 117 mg/dL — ABNORMAL HIGH (ref 70–99)
HCT: 39 % (ref 36.0–46.0)
HCT: 42 % (ref 36.0–46.0)
Hemoglobin: 13.3 g/dL (ref 12.0–15.0)
Hemoglobin: 14.3 g/dL (ref 12.0–15.0)
Potassium: 3.9 mmol/L (ref 3.5–5.1)
Potassium: 6.6 mmol/L (ref 3.5–5.1)
Sodium: 132 mmol/L — ABNORMAL LOW (ref 135–145)
Sodium: 132 mmol/L — ABNORMAL LOW (ref 135–145)
TCO2: 21 mmol/L — ABNORMAL LOW (ref 22–32)
TCO2: 22 mmol/L (ref 22–32)

## 2023-03-26 LAB — DIFFERENTIAL
Abs Immature Granulocytes: 0.03 10*3/uL (ref 0.00–0.07)
Basophils Absolute: 0 10*3/uL (ref 0.0–0.1)
Basophils Relative: 1 %
Eosinophils Absolute: 0.3 10*3/uL (ref 0.0–0.5)
Eosinophils Relative: 4 %
Immature Granulocytes: 0 %
Lymphocytes Relative: 17 %
Lymphs Abs: 1.3 10*3/uL (ref 0.7–4.0)
Monocytes Absolute: 0.6 10*3/uL (ref 0.1–1.0)
Monocytes Relative: 7 %
Neutro Abs: 5.4 10*3/uL (ref 1.7–7.7)
Neutrophils Relative %: 71 %

## 2023-03-26 LAB — CBC
HCT: 42.6 % (ref 36.0–46.0)
Hemoglobin: 14.3 g/dL (ref 12.0–15.0)
MCH: 32.1 pg (ref 26.0–34.0)
MCHC: 33.6 g/dL (ref 30.0–36.0)
MCV: 95.5 fL (ref 80.0–100.0)
Platelets: 141 10*3/uL — ABNORMAL LOW (ref 150–400)
RBC: 4.46 MIL/uL (ref 3.87–5.11)
RDW: 13.2 % (ref 11.5–15.5)
WBC: 7.6 10*3/uL (ref 4.0–10.5)
nRBC: 0 % (ref 0.0–0.2)

## 2023-03-26 LAB — COMPREHENSIVE METABOLIC PANEL
ALT: 24 U/L (ref 0–44)
AST: 34 U/L (ref 15–41)
Albumin: 3.7 g/dL (ref 3.5–5.0)
Alkaline Phosphatase: 90 U/L (ref 38–126)
Anion gap: 11 (ref 5–15)
BUN: 15 mg/dL (ref 8–23)
CO2: 22 mmol/L (ref 22–32)
Calcium: 9.4 mg/dL (ref 8.9–10.3)
Chloride: 99 mmol/L (ref 98–111)
Creatinine, Ser: 0.7 mg/dL (ref 0.44–1.00)
GFR, Estimated: >60 mL/min (ref >60)
Glucose, Bld: 117 mg/dL — ABNORMAL HIGH (ref 70–99)
Potassium: 3.9 mmol/L (ref 3.5–5.1)
Sodium: 132 mmol/L — ABNORMAL LOW (ref 135–145)
Total Bilirubin: 0.8 mg/dL (ref 0.3–1.2)
Total Protein: 7.3 g/dL (ref 6.5–8.1)

## 2023-03-26 LAB — ETHANOL: Alcohol, Ethyl (B): <10 mg/dL (ref <10)

## 2023-03-26 LAB — MRSA NEXT GEN BY PCR, NASAL: MRSA by PCR Next Gen: NOT DETECTED

## 2023-03-26 LAB — CBG MONITORING, ED: Glucose-Capillary: 107 mg/dL — ABNORMAL HIGH (ref 70–99)

## 2023-03-26 LAB — PROTIME-INR
INR: 1.1 (ref 0.8–1.2)
Prothrombin Time: 14.2 seconds (ref 11.4–15.2)

## 2023-03-26 LAB — APTT: aPTT: 28 seconds (ref 24–36)

## 2023-03-26 MED ORDER — PANTOPRAZOLE SODIUM 40 MG IV SOLR
40.0000 mg | Freq: Every day | INTRAVENOUS | Status: DC
  Administered 2023-03-26: 40 mg via INTRAVENOUS
  Filled 2023-03-26: qty 10

## 2023-03-26 MED ORDER — ACETAMINOPHEN 325 MG PO TABS: 650.0000 mg | ORAL_TABLET | ORAL | Status: DC | PRN

## 2023-03-26 MED ORDER — LEVOTHYROXINE SODIUM 25 MCG PO TABS
25.0000 ug | ORAL_TABLET | Freq: Every day | ORAL | Status: DC
  Administered 2023-03-28 – 2023-03-29 (×2): 25 ug via ORAL
  Filled 2023-03-26 (×2): qty 1

## 2023-03-26 MED ORDER — IOHEXOL 350 MG/ML SOLN: 75.0000 mL | Freq: Once | INTRAVENOUS | Status: DC | PRN

## 2023-03-26 MED ORDER — ACETAMINOPHEN 160 MG/5ML PO SOLN
650.0000 mg | ORAL | Status: DC | PRN
  Administered 2023-03-27: 650 mg
  Filled 2023-03-26: qty 20.3

## 2023-03-26 MED ORDER — SENNOSIDES-DOCUSATE SODIUM 8.6-50 MG PO TABS
1.0000 | ORAL_TABLET | Freq: Two times a day (BID) | ORAL | Status: DC
  Administered 2023-03-27 – 2023-03-28 (×3): 1 via ORAL
  Filled 2023-03-26 (×3): qty 1

## 2023-03-26 MED ORDER — GADOBUTROL 1 MMOL/ML IV SOLN
5.0000 mL | Freq: Once | INTRAVENOUS | Status: AC | PRN
  Administered 2023-03-26: 5 mL via INTRAVENOUS

## 2023-03-26 MED ORDER — CHLORHEXIDINE GLUCONATE CLOTH 2 % EX PADS
6.0000 | MEDICATED_PAD | Freq: Every day | CUTANEOUS | Status: DC
  Administered 2023-03-26 – 2023-03-29 (×4): 6 via TOPICAL

## 2023-03-26 MED ORDER — LABETALOL HCL 5 MG/ML IV SOLN
10.0000 mg | Freq: Once | INTRAVENOUS | Status: AC
  Administered 2023-03-27: 10 mg via INTRAVENOUS
  Filled 2023-03-26: qty 4

## 2023-03-26 MED ORDER — SODIUM CHLORIDE 0.9 % IV SOLN: INTRAVENOUS | Status: DC

## 2023-03-26 MED ORDER — CLEVIDIPINE BUTYRATE 0.5 MG/ML IV EMUL
0.0000 mg/h | INTRAVENOUS | Status: DC
  Administered 2023-03-27: 2 mg/h via INTRAVENOUS
  Filled 2023-03-26: qty 50

## 2023-03-26 MED ORDER — VITAMIN D (ERGOCALCIFEROL) 1.25 MG (50000 UNIT) PO CAPS: 50000.0000 [IU] | ORAL_CAPSULE | ORAL | Status: DC

## 2023-03-26 MED ORDER — POLYVINYL ALCOHOL 1.4 % OP SOLN
1.0000 [drp] | OPHTHALMIC | Status: DC | PRN
  Filled 2023-03-26: qty 15

## 2023-03-26 MED ORDER — BACLOFEN 20 MG PO TABS
20.0000 mg | ORAL_TABLET | Freq: Every day | ORAL | Status: DC
  Administered 2023-03-27 – 2023-03-28 (×2): 20 mg via ORAL
  Filled 2023-03-26 (×4): qty 1

## 2023-03-26 MED ORDER — MAGNESIUM 250 MG PO TABS: 1.0000 | ORAL_TABLET | Freq: Every day | ORAL | Status: DC

## 2023-03-26 MED ORDER — OYSTER SHELL CALCIUM/D3 500-5 MG-MCG PO TABS
0.5000 | ORAL_TABLET | Freq: Two times a day (BID) | ORAL | Status: DC
  Administered 2023-03-27 – 2023-03-29 (×5): 0.5 via ORAL
  Filled 2023-03-26 (×5): qty 1
  Filled 2023-03-26: qty 0.5

## 2023-03-26 MED ORDER — IOHEXOL 350 MG/ML SOLN
75.0000 mL | Freq: Once | INTRAVENOUS | Status: AC | PRN
  Administered 2023-03-26: 75 mL via INTRAVENOUS

## 2023-03-26 MED ORDER — ORAL CARE MOUTH RINSE: 15.0000 mL | OROMUCOSAL | Status: DC | PRN

## 2023-03-26 MED ORDER — ACETAMINOPHEN 650 MG RE SUPP: 650.0000 mg | RECTAL | Status: DC | PRN

## 2023-03-26 MED ORDER — SODIUM CHLORIDE 0.9% FLUSH: 3.0000 mL | Freq: Once | INTRAVENOUS | Status: DC

## 2023-03-26 MED ORDER — STROKE: EARLY STAGES OF RECOVERY BOOK
Freq: Once | Status: AC
  Filled 2023-03-26: qty 1

## 2023-03-26 MED ORDER — VITAMIN D (ERGOCALCIFEROL) 1.25 MG (50000 UNIT) PO CAPS
50000.0000 [IU] | ORAL_CAPSULE | ORAL | Status: DC
  Filled 2023-03-26: qty 1

## 2023-03-26 MED ORDER — CARBOXYMETHYLCELLUL-GLYCERIN 0.5-0.9 % OP SOLN: 2.0000 [drp] | Freq: Four times a day (QID) | OPHTHALMIC | Status: DC | PRN

## 2023-03-26 MED ORDER — BACLOFEN 10 MG PO TABS
10.0000 mg | ORAL_TABLET | Freq: Two times a day (BID) | ORAL | Status: DC
  Administered 2023-03-27 – 2023-03-29 (×4): 10 mg via ORAL
  Filled 2023-03-26 (×6): qty 1

## 2023-03-26 MED ORDER — MAGNESIUM OXIDE -MG SUPPLEMENT 400 (240 MG) MG PO TABS
200.0000 mg | ORAL_TABLET | Freq: Every day | ORAL | Status: DC
  Administered 2023-03-27 – 2023-03-29 (×3): 200 mg via ORAL
  Filled 2023-03-26 (×3): qty 1

## 2023-03-26 NOTE — ED Notes (Signed)
Pat[place] currently transferred to CT, will go to MRI after.

## 2023-03-26 NOTE — Code Documentation (Signed)
Stroke Response Nurse Documentation Code Documentation  Aarielle Urbanovsky is a 89 y.o. female arriving to [facility]  via [facility] on 03/26/2023 with past medical hx of Multiple sclerosis, osteoporosis, arthritis, right basal ganglia stroke with residual left sided weakness. On aspirin 81 mg daily. Code stroke was activated by EMS.   Patient from home where she was LKW at 0900 and now complaining of sudden onset right sided weakness . Per EMS, around 0900 patient told her caregiver she felt like she was having a stroke and had right-sided weakness. EMS was call early evening with no known reason as to the delay.   Stroke team at the bedside on patient arrival. Labs drawn and patient cleared for CT by EDP. Patient to CT with team. NIHSS 12, see documentation for details and code stroke times. Patient with left facial droop, right arm weakness, bilateral leg weakness, right decreased sensation, and dysarthria  on exam. The following imaging was completed:  CT Head. Patient is not a candidate for IV Thrombolytic due to ICH noted on imaging per provider. Patient is not not a candidate for IR due to ICH noted on imaging per provider.   Care Plan: q1hr Pupil checks, VS, NIHSS; BP goal 130-150, notify if <100.   Bedside handoff with ED RN Glee Arvin.    Felecia Jan  Stroke Response RN

## 2023-03-26 NOTE — ED Triage Notes (Signed)
Patient BIB EMS coming from home with stroke like symptoms. LKW 0900. Patient c/o right sided weakness with slurred speech. No Blood thinners.

## 2023-03-26 NOTE — Progress Notes (Signed)
eLink Physician-Brief Progress Note Patient Name: [name] DOB: 29-Dec-1931 MRN: [id number]   Date of Service  03/26/2023  HPI/Events of Note  89 year old female admitted to ICU for acute stroke.  Significant delay in presentation.  Seen by neurostroke team.  eICU Interventions  Not a candidate for thrombolysis.  Neurology recommends blood pressure goal of 130-150 in the first 24 hours.  Further workup including CT angiogram, echocardiogram and MRI to be performed.  Admit to ICU for frequent neurochecks. PT OT eval and treat.     Intervention Category Evaluation Type: Other  Carilyn Goodpasture 03/26/2023, 11:55 PM

## 2023-03-26 NOTE — H&P (Signed)
Neurology H&P  CC: Right-sided weakness  History is obtained from: Patient and chart  HPI: [name] is an 89 y.o. female with history of multiple sclerosis, osteoporosis, arthritis, right basal ganglia stroke with residual left-sided weakness and temporal arteritis who presents with sudden onset right-sided weakness.  Around 9:00 in the morning, patient told her caregiver that she fell like she was having a stroke and had right-sided weakness.  EMS was called later in the day and patient was brought to the emergency department. Reason for delay uncertain.  Head CT reveals left thalamic ICH about 0.6 mL in volume.  Discussed CODE STATUS with patient, and she states explicitly that she wishes to be a DNR/DNI and that she has a living will which her family should be able to bring in.  12 lead ECG reveals frequent PACs with aberrant conduction.   ICH score 1  LKW: 0900 TNK given?: No, ICH IR Thrombectomy? No, ICH Modified Rankin Scale: 4-Needs assistance to walk and tend to bodily needs   NIHSS:  1a Level of Conscious.: 0 1b LOC Questions: 0 1c LOC Commands: 0 2 Best Gaze: 0 3 Visual: 0 4 Facial Palsy: 1 5a Motor Arm - left: 0 5b Motor Arm - Right: 3 6a Motor Leg - Left: 3 6b Motor Leg - Right: 3 7 Limb Ataxia: 0 8 Sensory: 1 9 Best Language: 0 10 Dysarthria: 1 11 Extinct and Inattention.: 0 TOTAL: 12      ROS: A complete ROS was performed and is negative except as noted in the HPI.   Past Medical History:  Diagnosis Date   Chronic venous insufficiency of lower extremity 01/25/2019   With chronic bilateral lower extremity edema   Incontinent of urine    Multiple sclerosis (HCC) 2015   Osteoporosis    osteopenia   Polyarthritis    Post-menopausal    Rheumatoid arthritis (HCC)    Temporal arteritis (HCC) 2011     Family History  Problem Relation Age of Onset   Osteoarthritis Father    Stroke Father    Heart failure Mother    Hypertension Mother     Heart disease Mother    Multiple sclerosis Cousin    Multiple sclerosis Cousin    Multiple sclerosis Cousin      Social History:  reports that she has never smoked. She has never used smokeless tobacco. She reports that she does not drink alcohol and does not use drugs.   Prior to Admission medications   Medication Sig Start Date End Date Taking? Authorizing Provider  acetaminophen (TYLENOL) 325 MG tablet Take 2 tablets (650 mg total) by mouth every 6 (six) hours as needed for mild pain (or temp > 37.5 C (99.5 F)). 06/12/21   Elgergawy, Leana Roe, MD  aspirin EC 81 MG EC tablet Take 1 tablet (81 mg total) by mouth daily. Swallow whole. 06/13/21   Elgergawy, Leana Roe, MD  atorvastatin (LIPITOR) 80 MG tablet Take 1 tablet (80 mg total) by mouth daily. 06/13/21   Elgergawy, Leana Roe, MD  baclofen (LIORESAL) 10 MG tablet One po qAM, one po qPM and 2 po qHS 07/04/22   [name], [name], MD  Calcium Carb-Cholecalciferol (CALCIUM-VITAMIN D3) 250-125 MG-UNIT TABS Take 1 tablet by mouth 2 (two) times daily.    [provider]  Carboxymethylcellul-Glycerin (LUBRICATING EYE DROPS OP) Place 1 drop into both eyes 2 (two) times daily. For Eye Dryness 05/03/20   [provider]  diclofenac Sodium (VOLTAREN) 1 %  GEL APPLY 2 GMS TOPICALLY TO AFFECTED AREAS (RIGHT HIP) TWICE DAILY AS NEEDED FOR PAIN 06/06/20   Edmon Crape C, PA-C  food thickener (SIMPLYTHICK, NECTAR/LEVEL 2/MILDLY THICK,) GEL Please use with all fluids for nectar thick consistency 06/12/21   Elgergawy, Leana Roe, MD  levothyroxine (SYNTHROID) 25 MCG tablet Take 1 tablet (25 mcg total) by mouth daily. 06/06/20   Roena Malady, PA-C  Magnesium 250 MG TABS Take 1 tablet by mouth daily.    [provider]  nabumetone (RELAFEN) 500 MG tablet TAKE ONE TABLET BY MOUTH TWICE DAILY AS NEEDED 12/30/22   [name], [name], MD  Vitamin D, Ergocalciferol, (DRISDOL) 1.25 MG (50000 UNIT) CAPS capsule Take 50,000 Units by mouth every 7 (seven)  days.    [provider]  Abatacept (ORENCIA) 125 MG/ML SOSY 1 ml  06/07/21  [provider]  Azelastine HCl 137 MCG/SPRAY SOLN Place 1 spray into both nostrils 2 (two) times daily as needed (As needed for allergies). 06/06/20 06/07/21  Roena Malady, PA-C  leflunomide (ARAVA) 10 MG tablet  08/10/20 06/07/21  [provider]     Exam: Current vital signs: BP 118/60   Pulse 71   Temp 98 F (36.7 C) (Oral)   Resp 20   Wt 55.6 kg   LMP 09/08/1970 (Approximate)   SpO2 99%   BMI 20.40 kg/m    Physical Exam  Constitutional: Appears well-developed and well-nourished.  Psych: Affect appropriate to situation Eyes: No scleral injection HENT: No OP obstrucion Head: Normocephalic.  Cardiovascular: Normal rate and regular rhythm.  Respiratory: Effort normal and breath sounds normal to anterior ascultation GI: Soft.  No distension. There is no tenderness.  Skin: WDI  Neuro: Mental Status: Patient is awake, alert, oriented to person, place, month, year, and situation. Patient is able to give a clear and coherent history. No signs of aphasia or neglect Cranial Nerves: II: Visual Fields are full. Pupils are equal, round, and reactive to light.   III,IV, VI: EOMI without ptosis or diplopia.  V: Facial sensation is slightly diminished on the right VII: Slight right-sided facial droop VIII: Hearing is intact to voice X: Dysarthric XII: tongue is midline without atrophy or fasciculations.  Motor: Tone is normal. Bulk is normal.  5 out of 5 strength present in left upper extremity, 2 out of 5 strength in right upper extremity, 2 out of 5 strength in bilateral lower extremities Sensory: Sensation is symmetric to light touch and temperature in the arms and legs. No extinction to DSS present.  Cerebellar: FNF intact on the left, unable to perform on the right, unable to perform HKS   I have reviewed labs in epic and the pertinent results are:    Latest Ref Rng &  Units 03/26/2023    7:08 PM 03/26/2023    6:52 PM 06/11/2021    4:01 AM  CBC  WBC 4.0 - 10.5 K/uL   10.4   Hemoglobin 12.0 - 15.0 g/dL 16.1  09.6  04.5   Hematocrit 36.0 - 46.0 % 42.0  39.0  37.2   Platelets 150 - 400 K/uL   197        Latest Ref Rng & Units 03/26/2023    7:08 PM 03/26/2023    6:52 PM 06/06/2022   11:31 AM  BMP  Glucose 70 - 99 mg/dL 409  811  99   BUN 8 - 23 mg/dL Creatinine 0.44 - 1.00 mg/dL 9.14  0.50  0.60   Sodium 135 - 145 mmol/L 132  132  132   Potassium 3.5 - 5.1 mmol/L 3.9  6.6  4.2   Chloride 98 - 111 mmol/L 101  104  98   CO2 19 - 32 mEq/L   29   Calcium 8.4 - 10.5 mg/dL   9.6      I have reviewed the images obtained:  CT head: Acute hemorrhage in left thalamus/basal ganglia with approximate volume of 0.6 mL  Assessment: 89 year old female presenting with acute onset of right sided weakness. - Exam reveals right hemiparesis and right facial droop.  - CT head reveals an acute left thalamic/basal ganglia ICH - DDx for underlying etiology includes hypertensive hemorrhage and possible amyloid angiopathy  Recommendations: -Admit to ICU for close monitoring and blood pressure control -Systolic blood pressure goal 130-150 for first 24 hours, use Cleviprex and labetalol as needed -CTA head and neck -MRI brain with and without contrast -echocardiogram -Keep n.p.o. until passes swallow study -Neurochecks and NIHSS every hour -PT/OT/SLP -Hemoglobin A1c, lipid panel, CMP and CBC in the morning -No antiplatelets or anticoagulants -Stability CT scan at 1 AM -SCDs for DVT prophylaxis   This patient is critically ill and at significant risk of neurological worsening, death and care requires constant monitoring of vital signs, hemodynamics,respiratory and cardiac monitoring, neurological assessment, discussion with family, other specialists and medical decision making of high complexity. I spent 45 minutes of neurocritical care time  in the care of   this patient. This was time spent independent of any time provided by nurse practitioner or PA.  Pt seen by NP/Neuro and later by MD. Note/plan to be edited by MD as needed.  Cortney E Ernestina Columbia , MSN, AGACNP-BC [facility] See Amion for schedule and pager information 03/26/2023 7:19 PM  I have seen and examined the patient. I have discussed the assessment and plan with the Neurology NP and made amendations as needed. 89 year old female presenting with acute left  thalamic/basal ganglia ICH. Exam reveals right hemiparesis and right facial droop. Plan is to admit to the Neuro ICU for BP management, frequent neuro checks and further imaging studies. Most likely will require inpatient rehabilitation.  Electronically signed: Dr. Caryl Pina

## 2023-03-27 ENCOUNTER — Inpatient Hospital Stay (HOSPITAL_COMMUNITY): Payer: Medicare Other

## 2023-03-27 DIAGNOSIS — I6389 Other cerebral infarction: Secondary | ICD-10-CM | POA: Diagnosis not present

## 2023-03-27 DIAGNOSIS — I612 Nontraumatic intracerebral hemorrhage in hemisphere, unspecified: Secondary | ICD-10-CM | POA: Diagnosis not present

## 2023-03-27 LAB — LIPID PANEL
Cholesterol: 143 mg/dL (ref 0–200)
HDL: 63 mg/dL (ref >40)
LDL Cholesterol: 72 mg/dL (ref 0–99)
Total CHOL/HDL Ratio: 2.3 RATIO
Triglycerides: 41 mg/dL (ref <150)
VLDL: 8 mg/dL (ref 0–40)

## 2023-03-27 LAB — ECHOCARDIOGRAM COMPLETE
Area-P 1/2: 3.17 cm2
MV M vel: 1.24 m/s
MV Peak grad: 6.2 mmHg
P 1/2 time: 808 msec
S' Lateral: 3.2 cm
Weight: 1961.21 oz

## 2023-03-27 LAB — CBC
HCT: 35.6 % — ABNORMAL LOW (ref 36.0–46.0)
Hemoglobin: 12.6 g/dL (ref 12.0–15.0)
MCH: 32.1 pg (ref 26.0–34.0)
MCHC: 35.4 g/dL (ref 30.0–36.0)
MCV: 90.8 fL (ref 80.0–100.0)
Platelets: 197 10*3/uL (ref 150–400)
RBC: 3.92 MIL/uL (ref 3.87–5.11)
RDW: 13.4 % (ref 11.5–15.5)
WBC: 6.2 10*3/uL (ref 4.0–10.5)
nRBC: 0 % (ref 0.0–0.2)

## 2023-03-27 LAB — COMPREHENSIVE METABOLIC PANEL
ALT: 20 U/L (ref 0–44)
AST: 25 U/L (ref 15–41)
Albumin: 3 g/dL — ABNORMAL LOW (ref 3.5–5.0)
Alkaline Phosphatase: 74 U/L (ref 38–126)
Anion gap: 7 (ref 5–15)
BUN: 14 mg/dL (ref 8–23)
CO2: 22 mmol/L (ref 22–32)
Calcium: 8.8 mg/dL — ABNORMAL LOW (ref 8.9–10.3)
Chloride: 103 mmol/L (ref 98–111)
Creatinine, Ser: 0.63 mg/dL (ref 0.44–1.00)
GFR, Estimated: >60 mL/min (ref >60)
Glucose, Bld: 84 mg/dL (ref 70–99)
Potassium: 3.7 mmol/L (ref 3.5–5.1)
Sodium: 132 mmol/L — ABNORMAL LOW (ref 135–145)
Total Bilirubin: 0.8 mg/dL (ref 0.3–1.2)
Total Protein: 6 g/dL — ABNORMAL LOW (ref 6.5–8.1)

## 2023-03-27 LAB — HEMOGLOBIN A1C
Hgb A1c MFr Bld: 5.2 % (ref 4.8–5.6)
Mean Plasma Glucose: 102.54 mg/dL

## 2023-03-27 MED ORDER — ATORVASTATIN CALCIUM 80 MG PO TABS
80.0000 mg | ORAL_TABLET | Freq: Every day | ORAL | Status: DC
  Administered 2023-03-27 – 2023-03-29 (×3): 80 mg via ORAL
  Filled 2023-03-27 (×3): qty 1

## 2023-03-27 MED ORDER — HYDRALAZINE HCL 20 MG/ML IJ SOLN
5.0000 mg | INTRAMUSCULAR | Status: DC | PRN
  Administered 2023-03-27 – 2023-03-29 (×2): 10 mg via INTRAVENOUS
  Filled 2023-03-27 (×2): qty 1

## 2023-03-27 MED ORDER — SODIUM CHLORIDE 0.9 % IV SOLN: INTRAVENOUS | Status: DC

## 2023-03-27 MED ORDER — AMLODIPINE BESYLATE 5 MG PO TABS
5.0000 mg | ORAL_TABLET | Freq: Every day | ORAL | Status: DC
  Administered 2023-03-27: 5 mg via ORAL
  Filled 2023-03-27: qty 1

## 2023-03-27 MED ORDER — LOSARTAN POTASSIUM 50 MG PO TABS
50.0000 mg | ORAL_TABLET | Freq: Every day | ORAL | Status: DC
  Administered 2023-03-27 – 2023-03-29 (×3): 50 mg via ORAL
  Filled 2023-03-27 (×3): qty 1

## 2023-03-27 MED ORDER — HEPARIN SODIUM (PORCINE) 5000 UNIT/ML IJ SOLN
5000.0000 [IU] | Freq: Three times a day (TID) | INTRAMUSCULAR | Status: DC
  Administered 2023-03-27 – 2023-03-29 (×5): 5000 [IU] via SUBCUTANEOUS
  Filled 2023-03-27 (×5): qty 1

## 2023-03-27 MED ORDER — AMLODIPINE BESYLATE 10 MG PO TABS
10.0000 mg | ORAL_TABLET | Freq: Every day | ORAL | Status: DC
  Administered 2023-03-28 – 2023-03-29 (×2): 10 mg via ORAL
  Filled 2023-03-27 (×2): qty 1

## 2023-03-27 MED ORDER — POTASSIUM CHLORIDE 20 MEQ PO PACK
40.0000 meq | PACK | Freq: Once | ORAL | Status: AC
  Administered 2023-03-27: 40 meq via ORAL
  Filled 2023-03-27: qty 2

## 2023-03-27 MED ORDER — ORAL CARE MOUTH RINSE
15.0000 mL | OROMUCOSAL | Status: DC
  Administered 2023-03-27 – 2023-03-29 (×7): 15 mL via OROMUCOSAL

## 2023-03-27 MED ORDER — HYDRALAZINE HCL 20 MG/ML IJ SOLN: 5.0000 mg | INTRAMUSCULAR | Status: DC | PRN

## 2023-03-27 MED ORDER — PANTOPRAZOLE SODIUM 40 MG PO TBEC
40.0000 mg | DELAYED_RELEASE_TABLET | Freq: Every day | ORAL | Status: DC
  Administered 2023-03-27 – 2023-03-29 (×3): 40 mg via ORAL
  Filled 2023-03-27 (×3): qty 1

## 2023-03-27 NOTE — ED Provider Notes (Signed)
[facility] NEURO/TRAUMA/SURGICAL ICU Provider Note  CSN: [id number] Arrival date & time: 03/26/23 1836  Chief Complaint(s) Code Stroke  HPI [name] is a 89 y.o. female with PMH MS, temporal arteritis, rheumatoid arthritis who presents emergency department for evaluation of right-sided weakness.  Last known well at 9 AM.  Patient arrives as a stroke alert as she meets LVO criteria.  Additional history unable to be obtained secondary to acuity of condition.   Past Medical History Past Medical History:  Diagnosis Date   Chronic venous insufficiency of lower extremity 01/25/2019   With chronic bilateral lower extremity edema   Incontinent of urine    Multiple sclerosis (HCC) 2015   Osteoporosis    osteopenia   Polyarthritis    Post-menopausal    Rheumatoid arthritis (HCC)    Temporal arteritis (HCC) 2011   Patient Active Problem List   Diagnosis Date Noted   ICH (intracerebral hemorrhage) 03/26/2023   Wheelchair dependent 07/04/2022   Acquired hypothyroidism 06/06/2022   Age related osteoporosis 06/06/2022   Bilateral impacted cerumen 05/17/2022   Atrophic vaginitis 04/05/2022   Disorder of female genital organs 04/05/2022   Late effects of cerebrovascular disease 04/05/2022   Personal history of transient ischemic attack (TIA), and cerebral infarction without residual deficits 04/05/2022   Type 2 diabetes mellitus with proliferative diabetic retinopathy without macular edema, left eye 04/05/2022   S/P right hip fracture 07/19/2021   Stroke of right basal ganglia 06/08/2021   Acute CVA (cerebrovascular accident) 06/07/2021   Atherosclerotic heart disease of native coronary artery without angina pectoris 04/13/2021   Chronic edema 04/13/2021   Difficulty in walking, not elsewhere classified 04/13/2021   Frail elderly 04/13/2021   Giant cell arteritis 04/13/2021   Hypercholesterolemia 04/13/2021   Hypertensive heart disease without congestive heart failure  04/13/2021   Hypomagnesemia 04/13/2021   Osteoarthritis 04/13/2021   Protein calorie malnutrition 04/13/2021   Age-related osteoporosis without current pathological fracture 12/13/2020   Congestive heart failure 12/13/2020   Monoclonal paraproteinemia 12/13/2020   Multiple sclerosis 12/13/2020   Unspecified abnormal finding in specimens from other organs, systems and tissues 12/13/2020   Vitamin D deficiency 12/13/2020   Hyponatremia 04/09/2020   Acetabular fracture 04/01/2020   Closed right ischial fracture 04/01/2020   Hypertension 04/01/2020   Hypothyroidism 04/01/2020   Frequent falls 03/23/2020   Rheumatoid arthritis 03/23/2020   Tympanic membrane central perforation 12/15/2019   Cholesteatoma of external auditory canal, right 09/10/2019   Hearing loss 09/10/2019   Chronic venous insufficiency of lower extremity 01/25/2019   Grade II diastolic dysfunction 12/23/2018   Urinary dysfunction 12/22/2017   Multiple sclerosis, primary progressive 06/19/2017   Left foot drop 03/17/2017   Primary chronic progressive multiple sclerosis 03/17/2017   Myelopathy 03/17/2017   Bilateral lower extremity edema 03/17/2017   Home Medication(s) Prior to Admission medications   Medication Sig Start Date End Date Taking? Authorizing Provider  acetaminophen (TYLENOL) 325 MG tablet Take 2 tablets (650 mg total) by mouth every 6 (six) hours as needed for mild pain (or temp > 37.5 C (99.5 F)). 06/12/21  Yes Elgergawy, Leana Roe, MD  aspirin EC 81 MG EC tablet Take 1 tablet (81 mg total) by mouth daily. Swallow whole. 06/13/21  Yes Elgergawy, Leana Roe, MD  atorvastatin (LIPITOR) 80 MG tablet Take 1 tablet (80 mg total) by mouth daily. 06/13/21  Yes Elgergawy, Leana Roe, MD  baclofen (LIORESAL) 10 MG tablet One po qAM, one po qPM and 2 po qHS Patient taking differently:  See admin instructions. One po qAM, one po 3PM and 2 po qHS 07/04/22  Yes [name], [name], MD  Calcium Carb-Cholecalciferol (CALCIUM-VITAMIN  D3) 250-125 MG-UNIT TABS Take 1 tablet by mouth daily.   Yes [provider]  Carboxymethylcellul-Glycerin (LUBRICATING EYE DROPS OP) Place 1 drop into both eyes 2 (two) times daily. For Eye Dryness 05/03/20  Yes [provider]  levothyroxine (SYNTHROID) 25 MCG tablet Take 1 tablet (25 mcg total) by mouth daily. 06/06/20  Yes Lassen, Arlo C, PA-C  Magnesium 250 MG TABS Take 1 tablet by mouth daily.   Yes [provider]  nabumetone (RELAFEN) 500 MG tablet TAKE ONE TABLET BY MOUTH TWICE DAILY AS NEEDED Patient taking differently: Take 500 mg by mouth 2 (two) times daily as needed for moderate pain or mild pain. 12/30/22  Yes [name], [name], MD  Vitamin D, Ergocalciferol, (DRISDOL) 1.25 MG (50000 UNIT) CAPS capsule Take 50,000 Units by mouth every 7 (seven) days.   Yes [provider]  diclofenac Sodium (VOLTAREN) 1 % GEL APPLY 2 GMS TOPICALLY TO AFFECTED AREAS (RIGHT HIP) TWICE DAILY AS NEEDED FOR PAIN Patient not taking: Reported on 03/26/2023 06/06/20   Roena Malady, PA-C  food thickener (SIMPLYTHICK, NECTAR/LEVEL 2/MILDLY THICK,) GEL Please use with all fluids for nectar thick consistency Patient not taking: Reported on 03/26/2023 06/12/21   Elgergawy, Leana Roe, MD  Abatacept (ORENCIA) 125 MG/ML SOSY 1 ml  06/07/21  [provider]  Azelastine HCl 137 MCG/SPRAY SOLN Place 1 spray into both nostrils 2 (two) times daily as needed (As needed for allergies). 06/06/20 06/07/21  Roena Malady, PA-C  leflunomide (ARAVA) 10 MG tablet  08/10/20 06/07/21  [provider]                                                                                                                                    Past Surgical History Past Surgical History:  Procedure Laterality Date   ABDOMINAL HYSTERECTOMY  1971   TVH   breast ductectomy Right 05/1995   COLONOSCOPY  10/2011   Family History Family History  Problem Relation Age of Onset   Osteoarthritis Father     Stroke Father    Heart failure Mother    Hypertension Mother    Heart disease Mother    Multiple sclerosis Cousin    Multiple sclerosis Cousin    Multiple sclerosis Cousin     Social History Social History   Tobacco Use   Smoking status: Never   Smokeless tobacco: Never  Substance Use Topics   Alcohol use: No   Drug use: No   Allergies Sulfasalazine, Aspirin, Penicillins, Sulfa antibiotics, and Sulfamethoxazole-trimethoprim  Review of Systems Review of Systems  Unable to perform ROS: Acuity of condition    Physical Exam Vital Signs  I have reviewed the triage vital signs BP (!) 131/59   Pulse (!) 57   Temp (!)  97.4 F (36.3 C) (Oral)   Resp 15   Wt 55.6 kg   LMP 09/08/1970 (Approximate)   SpO2 95%   BMI 20.40 kg/m   Physical Exam Vitals and nursing note reviewed.  Constitutional:      General: She is not in acute distress.    Appearance: She is well-developed. She is ill-appearing.  HENT:     Head: Normocephalic and atraumatic.  Eyes:     Conjunctiva/sclera: Conjunctivae normal.  Cardiovascular:     Rate and Rhythm: Normal rate and regular rhythm.     Heart sounds: No murmur heard. Pulmonary:     Effort: Pulmonary effort is normal. No respiratory distress.     Breath sounds: Normal breath sounds.  Abdominal:     Palpations: Abdomen is soft.     Tenderness: There is no abdominal tenderness.  Musculoskeletal:        General: No swelling.     Cervical back: Neck supple.  Skin:    General: Skin is warm and dry.     Capillary Refill: Capillary refill takes less than 2 seconds.  Neurological:     Mental Status: She is alert.     Motor: Weakness present.  Psychiatric:        Mood and Affect: Mood normal.     ED Results and Treatments Labs (all labs ordered are listed, but only abnormal results are displayed) Labs Reviewed  CBC - Abnormal; Notable for the following components:      Result Value   Platelets 141 (*)    All other components within  normal limits  COMPREHENSIVE METABOLIC PANEL - Abnormal; Notable for the following components:   Sodium 132 (*)    Glucose, Bld 117 (*)    All other components within normal limits  CBC - Abnormal; Notable for the following components:   HCT 35.6 (*)    All other components within normal limits  COMPREHENSIVE METABOLIC PANEL - Abnormal; Notable for the following components:   Sodium 132 (*)    Calcium 8.8 (*)    Total Protein 6.0 (*)    Albumin 3.0 (*)    All other components within normal limits  I-STAT CHEM 8, ED - Abnormal; Notable for the following components:   Sodium 132 (*)    Potassium 6.6 (*)    Glucose, Bld 107 (*)    Calcium, Ion 0.99 (*)    All other components within normal limits  CBG MONITORING, ED - Abnormal; Notable for the following components:   Glucose-Capillary 107 (*)    All other components within normal limits  I-STAT CHEM 8, ED - Abnormal; Notable for the following components:   Sodium 132 (*)    Glucose, Bld 117 (*)    Calcium, Ion 1.04 (*)    TCO2 21 (*)    All other components within normal limits  MRSA NEXT GEN BY PCR, NASAL  DIFFERENTIAL  ETHANOL  HEMOGLOBIN A1C  LIPID PANEL  PROTIME-INR  APTT  Radiology CT HEAD WO CONTRAST ( )  Result Date: 03/27/2023 CLINICAL DATA:  Hemorrhagic stroke EXAM: CT HEAD WITHOUT CONTRAST TECHNIQUE: Contiguous axial images were obtained from the base of the skull through the vertex without intravenous contrast. RADIATION DOSE REDUCTION: This exam was performed according to the departmental dose-optimization program which includes automated exposure control, adjustment of the mA and/or kV according to patient size and/or use of iterative reconstruction technique. COMPARISON:  03/26/2023 FINDINGS: Brain: Unchanged appearance of left thalamus intraparenchymal hematoma with surrounding edema. There is  periventricular hypoattenuation compatible with chronic microvascular disease. Old right capsular small vessel infarct. Generalized volume loss. No hydrocephalus. No new site of hemorrhage. No mass effect. No extra-axial collection. Vascular: No hyperdense vessel or unexpected calcification. Skull: Normal. Negative for fracture or focal lesion. Sinuses/Orbits: No acute finding. Other: None. IMPRESSION: Unchanged appearance of left thalamus intraparenchymal hematoma with surrounding edema. No new site of hemorrhage. Electronically Signed   By: Deatra Robinson M.D.   On: 03/27/2023 01:34   MR BRAIN W WO CONTRAST  Result Date: 03/26/2023 CLINICAL DATA:  Hemorrhagic stroke EXAM: MRI HEAD WITHOUT AND WITH CONTRAST TECHNIQUE: Multiplanar, multiecho pulse sequences of the brain and surrounding structures were obtained without and with intravenous contrast. CONTRAST:  5mL GADAVIST GADOBUTROL 1 MMOL/ML IV SOLN COMPARISON:  06/07/2021 FINDINGS: Brain: No acute infarct. Unchanged appearance of small intraparenchymal hematoma in the left thalamus. There is confluent hyperintense T2-weighted signal within the white matter. Generalized volume loss. The midline structures are normal. Old right basal ganglia small vessel infarct. Vascular: Major flow voids are preserved. Skull and upper cervical spine: Normal calvarium and skull base. Visualized upper cervical spine and soft tissues are normal. Sinuses/Orbits:No paranasal sinus fluid levels or advanced mucosal thickening. No mastoid or middle ear effusion. Normal orbits. IMPRESSION: Unchanged appearance of small intraparenchymal hematoma in the left thalamus. No new hemorrhage or mass effect. No acute ischemia or mass lesion. Electronically Signed   By: Deatra Robinson M.D.   On: 03/26/2023 21:04   CT ANGIO HEAD NECK W WO CM  Result Date: 03/26/2023 CLINICAL DATA:  Hemorrhagic stroke EXAM: CT ANGIOGRAPHY HEAD AND NECK WITH AND WITHOUT CONTRAST TECHNIQUE: Multidetector CT  imaging of the head and neck was performed using the standard protocol during bolus administration of intravenous contrast. Multiplanar CT image reconstructions and MIPs were obtained to evaluate the vascular anatomy. Carotid stenosis measurements (when applicable) are obtained utilizing NASCET criteria, using the distal internal carotid diameter as the denominator. RADIATION DOSE REDUCTION: This exam was performed according to the departmental dose-optimization program which includes automated exposure control, adjustment of the mA and/or kV according to patient size and/or use of iterative reconstruction technique. CONTRAST:  75mL OMNIPAQUE IOHEXOL 350 MG/ML SOLN COMPARISON:  None Available. FINDINGS: CTA NECK FINDINGS SKELETON: There is no bony spinal canal stenosis. No lytic or blastic lesion. OTHER NECK: Normal pharynx, larynx and major salivary glands. No cervical lymphadenopathy. Unremarkable thyroid gland. UPPER CHEST: No pneumothorax or pleural effusion. No nodules or masses. AORTIC ARCH: There is no calcific atherosclerosis of the aortic arch. 3.9 cm ascending aortic aneurysm. Conventional 3 vessel aortic branching pattern. The visualized proximal subclavian arteries are widely patent. RIGHT CAROTID SYSTEM: Normal without aneurysm, dissection or stenosis. LEFT CAROTID SYSTEM: Normal without aneurysm, dissection or stenosis. VERTEBRAL ARTERIES: Left dominant configuration. Both origins are clearly patent. There is no dissection, occlusion or flow-limiting stenosis to the skull base (V1-V3 segments). CTA HEAD FINDINGS POSTERIOR CIRCULATION: --Vertebral arteries: Normal V4 segments. --Inferior cerebellar arteries: Normal. --Basilar  artery: Normal. --Superior cerebellar arteries: Normal. --Posterior cerebral arteries (PCA): Normal. ANTERIOR CIRCULATION: --Intracranial internal carotid arteries: Normal. --Anterior cerebral arteries (ACA): Normal. Absent left A1 segment, normal variant --Middle cerebral arteries  (MCA): Normal. VENOUS SINUSES: As permitted by contrast timing, patent. ANATOMIC VARIANTS: None Small thalamic hemorrhage is unchanged. Review of the MIP images confirms the above findings. IMPRESSION: 1. No emergent large vessel occlusion or high-grade stenosis of the intracranial arteries. 2. Unchanged small left thalamic hemorrhage. 3. A 3.9 cm ascending aortic aneurysm. Recommend annual imaging followup by CTA or MRA. This recommendation follows 2010 ACCF/AHA/AATS/ACR/ASA/SCA/SCAI/SIR/STS/SVM Guidelines for the Diagnosis and Management of Patients with Thoracic Aortic Disease. Circulation.2010; 121: V253-G644. Aortic aneurysm NOS (ICD10-I71.9) Electronically Signed   By: Deatra Robinson M.D.   On: 03/26/2023 20:14   CT HEAD CODE STROKE WO CONTRAST  Result Date: 03/26/2023 CLINICAL DATA:  Code stroke.  Right-sided weakness EXAM: CT HEAD WITHOUT CONTRAST TECHNIQUE: Contiguous axial images were obtained from the base of the skull through the vertex without intravenous contrast. RADIATION DOSE REDUCTION: This exam was performed according to the departmental dose-optimization program which includes automated exposure control, adjustment of the mA and/or kV according to patient size and/or use of iterative reconstruction technique. COMPARISON:  06/07/2021 CT head and MRI head FINDINGS: Brain: Hyperdense hemorrhage centered in the left basal ganglia/thalamus (series 3, image 19), which measures 1.1 x 1.2 x 1.1 cm, with an approximate volume of 0.6 mL, consistent with acute hemorrhage. Hypodensity surrounding the hemorrhage, likely edema, without significant mass effect or midline shift. No other acute infarct, mass, hydrocephalus, or extra-axial collection. Remote lacunar infarct in the right basal ganglia, which was acute on the 06/07/2021 MRI. Periventricular white matter changes, likely the sequela of chronic small vessel ischemic disease. Vascular: No hyperdense vessel. Skull: No acute fracture or focal lesion.  Sinuses/Orbits: Air-fluid level in the right maxillary sinus. Mucosal thickening throughout the remainder of the paranasal sinuses. Status post bilateral lens replacements. Other: The mastoids are well aerated. IMPRESSION: Acute hemorrhage centered in the left thalamus/basal ganglia, with an approximate volume of 0.6 mL. Surrounding edema without significant mass effect or midline shift. Imaging results were communicated on 03/26/2023 at 6:59 pm to provider Dr. Otelia Limes via secure text paging. Electronically Signed   By: Wiliam Ke M.D.   On: 03/26/2023 19:03    Pertinent labs & imaging results that were available during my care of the patient were reviewed by me and considered in my medical decision making (see MDM for details).  Medications Ordered in ED Medications  sodium chloride flush (NS) 0.9 % injection 3 mL (has no administration in time range)  labetalol (NORMODYNE) injection 10 mg (has no administration in time range)    And  clevidipine (CLEVIPREX) infusion 0.5 mg/mL (has no administration in time range)   stroke: early stages of recovery book (has no administration in time range)  acetaminophen (TYLENOL) tablet 650 mg (has no administration in time range)    Or  acetaminophen (TYLENOL) 160 MG/5ML solution 650 mg (has no administration in time range)    Or  acetaminophen (TYLENOL) suppository 650 mg (has no administration in time range)  senna-docusate (Senokot-S) tablet 1 tablet (1 tablet Oral Not Given 03/26/23 2215)  pantoprazole (PROTONIX) injection 40 mg (40 mg Intravenous Given 03/26/23 2238)  0.9 %  sodium chloride infusion ( Intravenous Infusion Verify 03/27/23 0700)  baclofen (LIORESAL) tablet 10 mg (has no administration in time range)  baclofen (LIORESAL) tablet 20 mg (20 mg Oral Not Given  03/26/23 2215)  calcium-vitamin D (OSCAL WITH D) 500-5 MG-MCG per tablet 0.5 tablet (0.5 tablets Oral Not Given 03/26/23 2215)  levothyroxine (SYNTHROID) tablet 25 mcg (25 mcg Oral Not  Given 03/27/23 0525)  Vitamin D (Ergocalciferol) (DRISDOL) 1.25 MG (50000 UNIT) capsule 50,000 Units (has no administration in time range)  polyvinyl alcohol (LIQUIFILM TEARS) 1.4 % ophthalmic solution 1 drop (has no administration in time range)  magnesium oxide (MAG-OX) tablet 200 mg (has no administration in time range)  Chlorhexidine Gluconate Cloth 2 % PADS 6 each (6 each Topical Given 03/26/23 2120)  Oral care mouth rinse (has no administration in time range)  iohexol (OMNIPAQUE) 350 MG/ML injection 75 mL (75 mLs Intravenous Contrast Given 03/26/23 1947)  gadobutrol (GADAVIST) 1 MMOL/ML injection 5 mL (5 mLs Intravenous Contrast Given 03/26/23 2030)                                                                                                                                     Procedures .Critical Care  Performed by: Glendora Score, MD Authorized by: Glendora Score, MD   Critical care provider statement:    Critical care time (minutes):  30   Critical care was necessary to treat or prevent imminent or life-threatening deterioration of the following conditions:  CNS failure or compromise   Critical care was time spent personally by me on the following activities:  Development of treatment plan with patient or surrogate, discussions with consultants, evaluation of patient's response to treatment, examination of patient, ordering and review of laboratory studies, ordering and review of radiographic studies, ordering and performing treatments and interventions, pulse oximetry, re-evaluation of patient's condition and review of old charts   (including critical care time)  Medical Decision Making / ED Course   This patient presents to the ED for concern of right-sided weakness, this involves an extensive number of treatment options, and is a complaint that carries with it a high risk of complications and morbidity.  The differential diagnosis includes CVA, ICH, Todd's paralysis, electrolyte  abnormality, seizure  MDM: Patient seen emergency room for evaluation of right-sided weakness.  Physical exam with right upper and lower extremity weakness.  Stroke alert initiated and patient met in the CAT scanner.  I spoke with the stroke neurologist Dr. Otelia Limes and initial stroke imaging concerning for a left thalamic ICH.  Patient's blood pressure is under control not requiring clevidipine.  Laboratory evaluation with a hyponatremia to 132 but is otherwise unremarkable.  Patient then admitted to the stroke service.   Additional history obtained: -Additional history obtained from brother -External records from outside source obtained and reviewed including: Chart review including previous notes, labs, imaging, consultation notes   Lab Tests: -I ordered, reviewed, and interpreted labs.   The pertinent results include:   Labs Reviewed  CBC - Abnormal; Notable for the following components:      Result Value   Platelets 141 (*)  All other components within normal limits  COMPREHENSIVE METABOLIC PANEL - Abnormal; Notable for the following components:   Sodium 132 (*)    Glucose, Bld 117 (*)    All other components within normal limits  CBC - Abnormal; Notable for the following components:   HCT 35.6 (*)    All other components within normal limits  COMPREHENSIVE METABOLIC PANEL - Abnormal; Notable for the following components:   Sodium 132 (*)    Calcium 8.8 (*)    Total Protein 6.0 (*)    Albumin 3.0 (*)    All other components within normal limits  I-STAT CHEM 8, ED - Abnormal; Notable for the following components:   Sodium 132 (*)    Potassium 6.6 (*)    Glucose, Bld 107 (*)    Calcium, Ion 0.99 (*)    All other components within normal limits  CBG MONITORING, ED - Abnormal; Notable for the following components:   Glucose-Capillary 107 (*)    All other components within normal limits  I-STAT CHEM 8, ED - Abnormal; Notable for the following components:   Sodium 132 (*)     Glucose, Bld 117 (*)    Calcium, Ion 1.04 (*)    TCO2 21 (*)    All other components within normal limits  MRSA NEXT GEN BY PCR, NASAL  DIFFERENTIAL  ETHANOL  HEMOGLOBIN A1C  LIPID PANEL  PROTIME-INR  APTT      EKG   EKG Interpretation  Date/Time:  Wednesday March 26 2023 19:04:31 EDT Ventricular Rate:  74 PR Interval:  55 QRS Duration: 103 QT Interval:  436 QTC Calculation: 457 R Axis:   -46 Text Interpretation: Sinus rhythm Atrial premature complexes anterior T wave inversions likley 2/2 to intracranial pathology Left ventricular hypertrophy Anterior infarct, old Confirmed by Shalayah Beagley ([id number]) on 03/27/2023 9:01:54 AM         Imaging Studies ordered: I ordered imaging studies including CT head, CT angio brain and neck I independently visualized and interpreted imaging. I agree with the radiologist interpretation   Medicines ordered and prescription drug management: Meds ordered this encounter  Medications   sodium chloride flush (NS) 0.9 % injection 3 mL   AND Linked Order Group    labetalol (NORMODYNE) injection 10 mg    clevidipine (CLEVIPREX) infusion 0.5 mg/mL    stroke: early stages of recovery book   OR Linked Order Group    acetaminophen (TYLENOL) tablet 650 mg    acetaminophen (TYLENOL) 160 MG/5ML solution 650 mg    acetaminophen (TYLENOL) suppository 650 mg   senna-docusate (Senokot-S) tablet 1 tablet   pantoprazole (PROTONIX) injection 40 mg   0.9 %  sodium chloride infusion   iohexol (OMNIPAQUE) 350 MG/ML injection 75 mL   baclofen (LIORESAL) tablet 10 mg   baclofen (LIORESAL) tablet 20 mg   calcium-vitamin D (OSCAL WITH D) 500-5 MG-MCG per tablet 0.5 tablet   DISCONTD: carboxymethylcellul-glycerin (REFRESH OPTIVE) 0.5-0.9 % ophthalmic solution 2 drop   levothyroxine (SYNTHROID) tablet 25 mcg   DISCONTD: Magnesium TABS 250 mg   DISCONTD: Vitamin D (Ergocalciferol) (DRISDOL) 1.25 MG (50000 UNIT) capsule 50,000 Units   DISCONTD: iohexol  (OMNIPAQUE) 350 MG/ML injection 75 mL   gadobutrol (GADAVIST) 1 MMOL/ML injection 5 mL   Vitamin D (Ergocalciferol) (DRISDOL) 1.25 MG (50000 UNIT) capsule 50,000 Units   polyvinyl alcohol (LIQUIFILM TEARS) 1.4 % ophthalmic solution 1 drop   magnesium oxide (MAG-OX) tablet 200 mg   Chlorhexidine Gluconate Cloth 2 % PADS 6  each   Oral care mouth rinse    -I have reviewed the patients home medicines and have made adjustments as needed  Critical interventions Stroke alert and consideration of TNK  Consultations Obtained: I requested consultation with the stroke neurologist Dr. Otelia Limes,  and discussed lab and imaging findings as well as pertinent plan - they recommend: Admission to stroke service   Cardiac Monitoring: The patient was maintained on a cardiac monitor.  I personally viewed and interpreted the cardiac monitored which showed an underlying rhythm of: NSR  Social Determinants of Health:  Factors impacting patients care include: none   Reevaluation: After the interventions noted above, I reevaluated the patient and found that they have :stayed the same  Co morbidities that complicate the patient evaluation  Past Medical History:  Diagnosis Date   Chronic venous insufficiency of lower extremity 01/25/2019   With chronic bilateral lower extremity edema   Incontinent of urine    Multiple sclerosis (HCC) 2015   Osteoporosis    osteopenia   Polyarthritis    Post-menopausal    Rheumatoid arthritis (HCC)    Temporal arteritis (HCC) 2011      Dispostion: I considered admission for this patient, and due to new ICH patient require hospital admission     Final Clinical Impression(s) / ED Diagnoses Final diagnoses:  None     @PCDICTATION @    Glendora Score, MD 03/27/23 [phone]

## 2023-03-27 NOTE — Evaluation (Signed)
Clinical/Bedside Swallow Evaluation Patient Details  Name: [name] MRN: [id number] Date of Birth: Jan 19, 1932  Today's Date: 03/27/2023 Time: SLP Start Time (ACUTE ONLY): 0905 SLP Stop Time (ACUTE ONLY): 0945 SLP Time Calculation (min) (ACUTE ONLY): 40 min  Past Medical History:  Past Medical History:  Diagnosis Date   Chronic venous insufficiency of lower extremity 01/25/2019   With chronic bilateral lower extremity edema   Incontinent of urine    Multiple sclerosis (HCC) 2015   Osteoporosis    osteopenia   Polyarthritis    Post-menopausal    Rheumatoid arthritis (HCC)    Temporal arteritis (HCC) 2011   Past Surgical History:  Past Surgical History:  Procedure Laterality Date   ABDOMINAL HYSTERECTOMY  1971   TVH   breast ductectomy Right 05/1995   COLONOSCOPY  10/2011   HPI:  Pt is an 89 yo female presenting with R sided weakness. MRI Brain revealed unchanged small L thalamic ICH. Pt seen by SLP 2022 for MBS with anterior spillage and aspiration with thin liquids. At that time, she was recommended a Dys 1 diet with nectar thick liquids and was eventually upgraded to a Dys 2 diet with nectar thick liquids before d/c. 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 She states that she used thickened liquids after CVA in 2022, but has since returned to primarily drinking thin liquids. She confirms being willing to try modified textures in order to continue eating and drinking and declines ever wanting a feeding tube. Oral motor exam with residual L sided facial and lingual weakness, but did not reveal any new onset R sided weakness. Pt's cough is weak, which she reports is different than baseline. Pt  was observed with trials of thin liquids, nectar thick liquids, honey thick liquids, and purees. She immediately coughed and cleared her throat during all liquid trials. Using a straw required significantly increased effort, but pt reports typically using straws at home and preferring their use. Across all trials, pt had multiple swallows and appeared to use an effortful swallow, which could be indicative of general weakness. SLP provided education regarding instrumental testing, with which pt is agreeable. Suspect pt's positioning would greatly impact her performance for an MBS, so SLP will coordinate with PT/OT to position pt upright in chair to complete a FEES as able. Recommend pt remain NPO pending FEES results. SLP Visit Diagnosis: Dysphagia, unspecified (R13.10)    Aspiration Risk  Mild aspiration risk    Diet Recommendation NPO   Medication Administration: Crushed with puree    Other  Recommendations Oral Care Recommendations: Oral care QID    Recommendations for follow up therapy are one component of a multi-disciplinary discharge planning process, led by the attending physician.  Recommendations may be updated based on patient status, additional functional criteria and insurance authorization.  Follow up Recommendations Home health SLP      Assistance Recommended at Discharge    Functional Status Assessment Patient has had a recent decline in their functional status and demonstrates the ability to make significant improvements in function in a reasonable and predictable amount of time.  Frequency and Duration min 2x/week  2 weeks       Prognosis Prognosis for improved oropharyngeal function: Fair Barriers  to Reach Goals: Time post onset;Severity of deficits      Swallow Study   General HPI: Pt is an 89 yo female presenting with R sided weakness. MRI Brain revealed unchanged small L thalamic ICH. Pt seen by SLP 2022 for MBS with anterior spillage and aspiration with thin  liquids. At that time, she was recommended a Dys 1 diet with nectar thick liquids and was eventually upgraded to a Dys 2 diet with nectar thick liquids before d/c. PMH includes MS, osteoporosis, arthritis, R basal ganglia CVA with residual L sided weakness and temporal arteritis Type of Study: Bedside Swallow Evaluation Previous Swallow Assessment: see HPI Diet Prior to this Study: NPO Temperature Spikes Noted: No Respiratory Status: Room air History of Recent Intubation: No Behavior/Cognition: Alert;Cooperative;Pleasant mood Oral Cavity Assessment: Within Functional Limits Oral Care Completed by SLP: No Oral Cavity - Dentition: Adequate natural dentition Vision: Functional for self-feeding Self-Feeding Abilities: Total assist Patient Positioning: Upright in bed Baseline Vocal Quality: Low vocal intensity Volitional Cough: Weak Volitional Swallow: Able to elicit    Oral/Motor/Sensory Function Overall Oral Motor/Sensory Function: Moderate impairment Facial ROM: Reduced left Facial Symmetry: Abnormal symmetry left Facial Strength: Reduced left Lingual ROM: Reduced left Lingual Symmetry: Abnormal symmetry left Lingual Strength: Reduced   Ice Chips Ice chips: Not tested   Thin Liquid Thin Liquid: Impaired Presentation: Cup;Straw Oral Phase Impairments: Reduced labial seal Oral Phase Functional Implications: Left anterior spillage;Oral holding Pharyngeal  Phase Impairments: Suspected delayed Swallow;Multiple swallows;Cough - Immediate    Nectar Thick Nectar Thick Liquid: Impaired Presentation: Cup;Straw Oral phase functional implications: Left anterior spillage Pharyngeal Phase Impairments: Multiple swallows;Throat Clearing - Immediate;Cough - Immediate   Honey Thick Honey Thick Liquid: Impaired Presentation: Cup;Straw Oral Phase Functional Implications: Left anterior spillage Pharyngeal Phase Impairments: Multiple swallows;Throat Clearing - Immediate;Cough - Immediate   Puree  Puree: Impaired Presentation: Spoon Oral Phase Impairments: Reduced lingual movement/coordination Oral Phase Functional Implications: Left anterior spillage;Oral holding Pharyngeal Phase Impairments: Multiple swallows   Solid     Solid: Not tested      Brunilda Payor., Student SLP  03/27/2023,10:55 AM

## 2023-03-27 NOTE — Evaluation (Signed)
Physical Therapy Evaluation Patient Details Name: [name] MRN: [id number] DOB: 14-Dec-1931 Today's Date: 03/27/2023  History of Present Illness  [name] is an 89 y.o. female admitted 03/26/23 with R sided weakness. MRI Brain revealed unchanged small L thalamic ICH. PMH includes MS, osteoporosis, arthritis, R basal ganglia CVA with residual L sided weakness and temporal arteritis, R hip fx 2021 secondary to fall nonop, CHF, HTN.   Clinical Impression  Pt in bed upon arrival of PT, agreeable to evaluation at this time. Prior to admission the pt was getting OOB every day with assist from family and use of hoyer lift and WC. The pt does report she is typically better able to assist with rolling/bed mobility than she is at this time. The pt now presents with limitations in functional mobility, strength, LE strength and ROM, and activity tolerance due to chronic debility, and will continue to benefit from skilled PT to address these deficits. The pt reports she does complete stretches and LE exercises daily, will benefit from skilled PT acutely to maintain strength and ROM during stay as well as to regain some independence with bed mobility. Pt family has all needed DME and assist to care for the patient.         Recommendations for follow up therapy are one component of a multi-disciplinary discharge planning process, led by the attending physician.  Recommendations may be updated based on patient status, additional functional criteria and insurance authorization.  Follow Up Recommendations       Assistance Recommended at Discharge Frequent or constant Supervision/Assistance  Patient can return home with the following  A lot of help with walking and/or transfers;A lot of help with bathing/dressing/bathroom    Equipment Recommendations None recommended by PT (pt has needed DME)  Recommendations for Other Services       Functional Status Assessment Patient has had a  recent decline in their functional status and demonstrates the ability to make significant improvements in function in a reasonable and predictable amount of time.     Precautions / Restrictions Precautions Precautions: Fall Restrictions Weight Bearing Restrictions: No Other Position/Activity Restrictions: hoyer lift at baseline to WC      Mobility  Bed Mobility Overal bed mobility: Needs Assistance Bed Mobility: Rolling Rolling: Max assist, +2 for safety/equipment, +2 for physical assistance         General bed mobility comments: rolling to the right max A as Pt bending knee and reaching across with LUE. max A +2 to the left as R side unable to assist at this time    Transfers Overall transfer level: Needs assistance Equipment used: Ambulation equipment used Transfers: Bed to chair/wheelchair/BSC             General transfer comment: Pt handled lift well. She is a Nurse, adult at baseline. She does get OOB daily via lift equipment. Transfer via Investment banker, [employer] (Stroke Patients Only)       Balance Overall balance assessment: Needs assistance Sitting-balance support: Bilateral upper extremity supported, Feet unsupported Sitting balance-Leahy Scale: Poor Sitting balance - Comments: able to pull forward chest into unsupported sitting with assist  Pertinent Vitals/Pain Pain Assessment Pain Assessment: Faces Faces Pain Scale: Hurts little more Pain Location: R hip and knees Pain Descriptors / Indicators: Discomfort, Grimacing, Guarding Pain Intervention(s): Limited activity within patient's tolerance, Monitored during session, Repositioned (increased pillows)    Home Living Family/patient expects to be discharged to:: Private residence                 Home Equipment: Hospital bed;Wheelchair -  manual (hoyer lift)      Prior Function Prior Level of Function : Needs assist       Physical Assist : ADLs (physical);Mobility (physical) Mobility (physical): Bed mobility;Transfers ADLs (physical): Bathing;Dressing;Toileting;IADLs Mobility Comments: hoyer lift to a WC daily ADLs Comments: dependent for bathing and dressing and IADL - she does her own feeding (grandaughter is an OT and has been unsuccesssful in getting her to use adaptive utensils), grooming, and face care routine     Hand Dominance   Dominant Hand: Right    Extremity/Trunk Assessment   Upper Extremity Assessment Upper Extremity Assessment: RUE deficits/detail RUE Deficits / Details: grasp 2/5, no noted elbow or shoulder movement RUE Sensation: decreased light touch;decreased proprioception RUE Coordination: decreased fine motor;decreased gross motor    Lower Extremity Assessment Lower Extremity Assessment: RLE deficits/detail;LLE deficits/detail RLE Deficits / Details: grossly 2/5 to MMT with pt only able to achieve partial ROM against gravity. pt also with baseline deficits in ROM. is able to move slightly at ankle (no active eversion LLE Deficits / Details: grossly 2/5 to MMT with pt only able to achieve partial ROM against gravity. pt also with baseline deficits in knee ROM. is able to move slightly at ankle (no active eversion. pt arrives in personal PRAFO due to heel wounds.    Cervical / Trunk Assessment Cervical / Trunk Assessment: Kyphotic  Communication   Communication: HOH;Expressive difficulties (slow dysarthric speech)  Cognition Arousal/Alertness: Awake/alert Behavior During Therapy: WFL for tasks assessed/performed Overall Cognitive Status: Within Functional Limits for tasks assessed                                 General Comments: Pt soft spoken and dysartric, but able to answer all questions and even joke around        General Comments General comments (skin integrity,  edema, etc.): son present and supportive    Exercises General Exercises - Lower Extremity Ankle Circles/Pumps: AAROM, Both, 5 reps, Seated Long Arc Quad: AAROM, Both, 5 reps, Seated   Assessment/Plan    PT Assessment Patient needs continued PT services  PT Problem List Decreased strength;Decreased range of motion       PT Treatment Interventions DME instruction;Functional mobility training;Therapeutic activities;Therapeutic exercise    PT Goals (Current goals can be found in the Care Plan section)  Acute Rehab PT Goals Patient Stated Goal: return home PT Goal Formulation: With patient Time For Goal Achievement: 04/10/23 Potential to Achieve Goals: Good    Frequency Min 2X/week     Co-evaluation PT/OT/SLP Co-Evaluation/Treatment: Yes Reason for Co-Treatment: Complexity of the patient's impairments (multi-system involvement);For patient/therapist safety;To address functional/ADL transfers;Necessary to address cognition/behavior during functional activity PT goals addressed during session: Proper use of DME;Strengthening/ROM OT goals addressed during session: ADL's and self-care;Proper use of Adaptive equipment and DME;Strengthening/ROM       AM-PAC PT "6 Clicks" Mobility  Outcome Measure Help needed turning from your back to your side while in a flat bed without using bedrails?: Total Help needed  moving from lying on your back to sitting on the side of a flat bed without using bedrails?: Total Help needed moving to and from a bed to a chair (including a wheelchair)?: Total Help needed standing up from a chair using your arms (e.g., wheelchair or bedside chair)?: Total Help needed to walk in hospital room?: Total Help needed climbing 3-5 steps with a railing? : Total 6 Click Score: 6    End of Session Equipment Utilized During Treatment: Other (comment) (lift) Activity Tolerance: Patient tolerated treatment well Patient left: in chair;with call bell/phone within  reach;with family/visitor present (with SLP) Nurse Communication: Mobility status;Need for lift equipment PT Visit Diagnosis: Unsteadiness on feet (R26.81);Muscle weakness (generalized) (M62.81)    Time: 5790-3833 PT Time Calculation (min) (ACUTE ONLY): 21 min   Charges:   PT Evaluation $PT Eval Low Complexity: 1 Low          Vickki Muff, PT, DPT   Acute Rehabilitation Department Office [phone] Secure Chat Communication Preferred  Ronnie Derby 03/27/2023, 2:45 PM

## 2023-03-27 NOTE — Progress Notes (Signed)
OT Cancellation Note  Patient Details Name: [name] MRN: [id number] DOB: 1932/11/03   Cancelled Treatment:    Reason Eval/Treat Not Completed: Active bedrest order remains this morning that is not set to expire until 19:14 today. PT will continue to follow but hold on initial evaluation until after bedrest expires or until otherwise directed by MD.   Evern Bio Khaled Herda 03/27/2023, 8:07 AM

## 2023-03-27 NOTE — Progress Notes (Signed)
STROKE TEAM PROGRESS NOTE   INTERVAL HISTORY Her son is at the bedside.   Patient brought in by EMS 4/17 with sudden onset of right-sided weakness.  On exam she was noted to have right hemiparesis and right facial droop.  CT head revealed an acute left thalamic/basal ganglia ICH.  Repeat CT stable. Systolic blood pressure goal less than 160, Cleviprex and hydralazine ordered as needed.  Vitals:   03/27/23 0500 03/27/23 0600 03/27/23 0700 03/27/23 0800  BP: (!) 143/60 (!) 140/64 (!) 131/59   Pulse: 62 67 (!) 57   Resp: 16 15 15    Temp:    (!) 97.4 F (36.3 C)  TempSrc:    Oral  SpO2: 95% 95% 95%   Weight:       CBC:  Recent Labs  Lab 03/26/23 1839 03/26/23 1852 03/26/23 1908 03/27/23 0431  WBC 7.6  --   --  6.2  NEUTROABS 5.4  --   --   --   HGB 14.3   < > 14.3 12.6  HCT 42.6   < > 42.0 35.6*  MCV 95.5  --   --  90.8  PLT 141*  --   --  197   < > = values in this interval not displayed.   Basic Metabolic Panel:  Recent Labs  Lab 03/26/23 1839 03/26/23 1852 03/26/23 1908 03/27/23 0431  NA 132*   < > 132* 132*  K 3.9   < > 3.9 3.7  CL 99   < > 101 103  CO2 22  --   --  22  GLUCOSE 117*   < > 117* 84  BUN 15   < > 18 14  CREATININE 0.70   < > 0.50 0.63  CALCIUM 9.4  --   --  8.8*   < > = values in this interval not displayed.   Lipid Panel:  Recent Labs  Lab 03/27/23 0431  CHOL 143  TRIG 41  HDL 63  CHOLHDL 2.3  VLDL 8  LDLCALC 72   HgbA1c:  Recent Labs  Lab 03/27/23 0431  HGBA1C 5.2   Urine Drug Screen: No results for input(s): "LABOPIA", "COCAINSCRNUR", "LABBENZ", "AMPHETMU", "THCU", "LABBARB" in the last 168 hours.  Alcohol Level  Recent Labs  Lab 03/26/23 2140  ETH <10    IMAGING past 24 hours CT HEAD WO CONTRAST ( )  Result Date: 03/27/2023 CLINICAL DATA:  Hemorrhagic stroke EXAM: CT HEAD WITHOUT CONTRAST TECHNIQUE: Contiguous axial images were obtained from the base of the skull through the vertex without intravenous contrast.  RADIATION DOSE REDUCTION: This exam was performed according to the departmental dose-optimization program which includes automated exposure control, adjustment of the mA and/or kV according to patient size and/or use of iterative reconstruction technique. COMPARISON:  03/26/2023 FINDINGS: Brain: Unchanged appearance of left thalamus intraparenchymal hematoma with surrounding edema. There is periventricular hypoattenuation compatible with chronic microvascular disease. Old right capsular small vessel infarct. Generalized volume loss. No hydrocephalus. No new site of hemorrhage. No mass effect. No extra-axial collection. Vascular: No hyperdense vessel or unexpected calcification. Skull: Normal. Negative for fracture or focal lesion. Sinuses/Orbits: No acute finding. Other: None. IMPRESSION: Unchanged appearance of left thalamus intraparenchymal hematoma with surrounding edema. No new site of hemorrhage. Electronically Signed   By: Deatra Robinson M.D.   On: 03/27/2023 01:34   MR BRAIN W WO CONTRAST  Result Date: 03/26/2023 CLINICAL DATA:  Hemorrhagic stroke EXAM: MRI HEAD WITHOUT AND WITH CONTRAST TECHNIQUE: Multiplanar, multiecho pulse sequences  of the brain and surrounding structures were obtained without and with intravenous contrast. CONTRAST:  5mL GADAVIST GADOBUTROL 1 MMOL/ML IV SOLN COMPARISON:  06/07/2021 FINDINGS: Brain: No acute infarct. Unchanged appearance of small intraparenchymal hematoma in the left thalamus. There is confluent hyperintense T2-weighted signal within the white matter. Generalized volume loss. The midline structures are normal. Old right basal ganglia small vessel infarct. Vascular: Major flow voids are preserved. Skull and upper cervical spine: Normal calvarium and skull base. Visualized upper cervical spine and soft tissues are normal. Sinuses/Orbits:No paranasal sinus fluid levels or advanced mucosal thickening. No mastoid or middle ear effusion. Normal orbits. IMPRESSION: Unchanged  appearance of small intraparenchymal hematoma in the left thalamus. No new hemorrhage or mass effect. No acute ischemia or mass lesion. Electronically Signed   By: Deatra Robinson M.D.   On: 03/26/2023 21:04   CT ANGIO HEAD NECK W WO CM  Result Date: 03/26/2023 CLINICAL DATA:  Hemorrhagic stroke EXAM: CT ANGIOGRAPHY HEAD AND NECK WITH AND WITHOUT CONTRAST TECHNIQUE: Multidetector CT imaging of the head and neck was performed using the standard protocol during bolus administration of intravenous contrast. Multiplanar CT image reconstructions and MIPs were obtained to evaluate the vascular anatomy. Carotid stenosis measurements (when applicable) are obtained utilizing NASCET criteria, using the distal internal carotid diameter as the denominator. RADIATION DOSE REDUCTION: This exam was performed according to the departmental dose-optimization program which includes automated exposure control, adjustment of the mA and/or kV according to patient size and/or use of iterative reconstruction technique. CONTRAST:  75mL OMNIPAQUE IOHEXOL 350 MG/ML SOLN COMPARISON:  None Available. FINDINGS: CTA NECK FINDINGS SKELETON: There is no bony spinal canal stenosis. No lytic or blastic lesion. OTHER NECK: Normal pharynx, larynx and major salivary glands. No cervical lymphadenopathy. Unremarkable thyroid gland. UPPER CHEST: No pneumothorax or pleural effusion. No nodules or masses. AORTIC ARCH: There is no calcific atherosclerosis of the aortic arch. 3.9 cm ascending aortic aneurysm. Conventional 3 vessel aortic branching pattern. The visualized proximal subclavian arteries are widely patent. RIGHT CAROTID SYSTEM: Normal without aneurysm, dissection or stenosis. LEFT CAROTID SYSTEM: Normal without aneurysm, dissection or stenosis. VERTEBRAL ARTERIES: Left dominant configuration. Both origins are clearly patent. There is no dissection, occlusion or flow-limiting stenosis to the skull base (V1-V3 segments). CTA HEAD FINDINGS POSTERIOR  CIRCULATION: --Vertebral arteries: Normal V4 segments. --Inferior cerebellar arteries: Normal. --Basilar artery: Normal. --Superior cerebellar arteries: Normal. --Posterior cerebral arteries (PCA): Normal. ANTERIOR CIRCULATION: --Intracranial internal carotid arteries: Normal. --Anterior cerebral arteries (ACA): Normal. Absent left A1 segment, normal variant --Middle cerebral arteries (MCA): Normal. VENOUS SINUSES: As permitted by contrast timing, patent. ANATOMIC VARIANTS: None Small thalamic hemorrhage is unchanged. Review of the MIP images confirms the above findings. IMPRESSION: 1. No emergent large vessel occlusion or high-grade stenosis of the intracranial arteries. 2. Unchanged small left thalamic hemorrhage. 3. A 3.9 cm ascending aortic aneurysm. Recommend annual imaging followup by CTA or MRA. This recommendation follows 2010 ACCF/AHA/AATS/ACR/ASA/SCA/SCAI/SIR/STS/SVM Guidelines for the Diagnosis and Management of Patients with Thoracic Aortic Disease. Circulation.2010; 121: Q469-G295. Aortic aneurysm NOS (ICD10-I71.9) Electronically Signed   By: Deatra Robinson M.D.   On: 03/26/2023 20:14   CT HEAD CODE STROKE WO CONTRAST  Result Date: 03/26/2023 CLINICAL DATA:  Code stroke.  Right-sided weakness EXAM: CT HEAD WITHOUT CONTRAST TECHNIQUE: Contiguous axial images were obtained from the base of the skull through the vertex without intravenous contrast. RADIATION DOSE REDUCTION: This exam was performed according to the departmental dose-optimization program which includes automated exposure control, adjustment of the mA  and/or kV according to patient size and/or use of iterative reconstruction technique. COMPARISON:  06/07/2021 CT head and MRI head FINDINGS: Brain: Hyperdense hemorrhage centered in the left basal ganglia/thalamus (series 3, image 19), which measures 1.1 x 1.2 x 1.1 cm, with an approximate volume of 0.6 mL, consistent with acute hemorrhage. Hypodensity surrounding the hemorrhage, likely  edema, without significant mass effect or midline shift. No other acute infarct, mass, hydrocephalus, or extra-axial collection. Remote lacunar infarct in the right basal ganglia, which was acute on the 06/07/2021 MRI. Periventricular white matter changes, likely the sequela of chronic small vessel ischemic disease. Vascular: No hyperdense vessel. Skull: No acute fracture or focal lesion. Sinuses/Orbits: Air-fluid level in the right maxillary sinus. Mucosal thickening throughout the remainder of the paranasal sinuses. Status post bilateral lens replacements. Other: The mastoids are well aerated. IMPRESSION: Acute hemorrhage centered in the left thalamus/basal ganglia, with an approximate volume of 0.6 mL. Surrounding edema without significant mass effect or midline shift. Imaging results were communicated on 03/26/2023 at 6:59 pm to provider Dr. Otelia Limes via secure text paging. Electronically Signed   By: Wiliam Ke M.D.   On: 03/26/2023 19:03    PHYSICAL EXAM NIH of 9 today. Patient is awake and alert, oriented, follows commands right facial droop, drift on right arm and right leg, decreased sensation noted on right side with mild dysarthria.  ASSESSMENT/PLAN [name] is a 89 y.o. female with history of Multiple sclerosis, osteoporosis, arthritis, right basal ganglia stroke with residual left sided weakness, on aspirin  daily presenting with sudden onset right sided weakness, LKW 0900 4/17. NIH 12 on ED arrival examination. CT head reveals an acute left thalamic/basal ganglia ICH. Improved exam.  Patient uses Hoyer lift and wheelchair to get around at home.  This is after previous stroke and a hip fracture at rehab afterwards.   ICH - Acute left thalamic/basal ganglia ICH, likely hypertensive small vessel ICH Code Stroke CT head: Acute hemorrhage centered in the left thalamus/basal ganglia, with an approximate volume of 0.6 mL. Surrounding edema without significant mass effect or  midline shift  CTA head & neck: No emergent large vessel occlusion or high-grade stenosis of the intracranial arteries. Unchanged small left thalamic hemorrhage. 3.9 cm ascending aortic aneurysm  MRI:  Unchanged appearance of small intraparenchymal hematoma in the left thalamus. No new hemorrhage or mass effect. No acute ischemia or mass lesion. Repeat CT 4/18: Unchanged appearance of left thalamus intraparenchymal hematoma with surrounding edema. No new site of hemorrhage 2D Echo: LVEF 60 to 65%  LDL 72 HgbA1c 5.2 VTE prophylaxis -heparin subcu aspirin 81 mg daily prior to admission, now on No antithrombotic due to ICH.  Therapy recommendations: Home health PT/OT Disposition: Pending  History of stroke TIA in 2010 05/2021 left-sided weakness.  CT negative.  MRI showed right CR infarcts.  CTA head and neck unremarkable.  EF 60 to 65%.  LDL 156, A1c 5.4.  Discharged on DAPT and Lipitor 80.  Hypertension Home meds:  none Systolic blood pressure goal < 160 use hydralazine IV as needed  Add amlodipine 10 and Cozaar 50 Long-term BP goal normotensive  Hyperlipidemia Home meds:  Lipitor , resumed in hospital LDL 72, goal < 70 Continue statin at discharge  Other Stroke Risk Factors Advanced Age >/= 19  Family hx stroke (father) CAD  Other Active Problems Multiple Sclerosis, followed with Dr. Epimenio Foot Rheumatoid Arthritis Osteoarthritis Mild hyponatremia, sodium 132 Hx of R leg fracture Right hip fracture experienced after prior  stroke Right femoral fracture after stroke when working with PT Since then patient literally bedbound Will obtain xray to evaluate progression  Hospital day # 1  Pt seen by Neuro NP/APP and later by MD. Note/plan to be edited by MD as needed.    Lynnae January, DNP, AGACNP-BC [facility] [phone] for Stroke APP   ATTENDING NOTE: I reviewed above note and agree with the assessment and plan. Pt was seen and examined.   Son at  bedside.  Patient was having 2D echo at this time.  She is lethargic, however awake alert, orientated to time, place, people and age, however soft voice, hypophonia.  No aphasia but paucity of speech, follows some commands.  Blinking to visual threat bilaterally, no gaze palsy, mild right facial droop, right upper extremity 2-/5 and finger movement 2+/5. LUE at least 3/5.  Bilateral lower extremity slight withdraw with pain. Sensation, coordination not quite cooperative and gait not tested.  Etiology for patient ICH likely hypertensive small vessel condition given location and history of small vessel infarct.  Continue BP management, BP goal less than 160.  Okay to work with PT/OT.  Passed swallow, started home BP meds, taper off Cleviprex as able.  DC IV fluid.  For detailed assessment and plan, please refer to above/below as I have made changes wherever appropriate.   Marvel Plan, MD PhD Stroke Neurology 03/27/2023 7:54 PM  This patient is critically ill due to ICH, hypertensive emergency, history of stroke and at significant risk of neurological worsening, death form hematoma expansion, cerebral edema, seizure. This patient's care requires constant monitoring of vital signs, hemodynamics, respiratory and cardiac monitoring, review of multiple databases, neurological assessment, discussion with family, other specialists and medical decision making of high complexity. I spent 40 minutes of neurocritical care time in the care of this patient. I had long discussion with son at bedside, updated pt current condition, treatment plan and potential prognosis, and answered all the questions.  He expressed understanding and appreciation.      To contact Stroke Continuity provider, please refer to [email or web address]. After hours, contact General Neurology

## 2023-03-27 NOTE — Procedures (Signed)
Objective Swallowing Evaluation: Type of Study: FEES-Fiberoptic Endoscopic Evaluation of Swallow   Patient Details  Name: [name] MRN: [id number] Date of Birth: [birth year]  Today's Date: 03/27/2023 Time: SLP Start Time (ACUTE ONLY): 1215 -SLP Stop Time (ACUTE ONLY): 1245  SLP Time Calculation (min) (ACUTE ONLY): 30 min   Past Medical History:  Past Medical History:  Diagnosis Date   Chronic venous insufficiency of lower extremity 01/25/2019   With chronic bilateral lower extremity edema   Incontinent of urine    Multiple sclerosis (HCC) 2015   Osteoporosis    osteopenia   Polyarthritis    Post-menopausal    Rheumatoid arthritis (HCC)    Temporal arteritis (HCC) 2011   Past Surgical History:  Past Surgical History:  Procedure Laterality Date   ABDOMINAL HYSTERECTOMY  1971   TVH   breast ductectomy Right 05/1995   COLONOSCOPY  10/2011   HPI: Pt is an 89 yo female presenting with R sided weakness. MRI Brain revealed unchanged small L thalamic ICH. Pt seen by SLP 2022 for MBS with anterior spillage and aspiration with thin liquids. At that time, she was recommended a Dys 1 diet with nectar thick liquids and was eventually upgraded to a Dys 2 diet with nectar thick liquids before d/c. During Bedside history and discussion included "Pt reports independently feeding herself a generally soft diet at home with thin liquids while sitting fully upright in her wheelchair at the table. She states that she used thickened liquids after CVA in 2022, but has since returned to primarily drinking thin liquids. She confirms being willing to try modified textures in order to continue eating and drinking and declines ever wanting a feeding tube. Oral motor exam with residual L sided facial and lingual weakness, but did not reveal any new onset R sided weakness. Pt's cough is weak, which she reports is different than baseline." PMH includes MS, osteoporosis, arthritis, R basal ganglia CVA  with residual L sided weakness and temporal arteritis   Subjective: pt is pleasant    Recommendations for follow up therapy are one component of a multi-disciplinary discharge planning process, led by the attending physician.  Recommendations may be updated based on patient status, additional functional criteria and insurance authorization.  Assessment / Plan / Recommendation     03/27/2023    1:30 PM  Clinical Impressions  Clinical Impression Pt demonstrates impaired breath support for cough and generalized weakness impacting overall safety and endurance for PO intake. Otherwise, once upright in chair, pt demonstrated no focal neuromuscular impairment that would be significant difference from her baseline. Oral phase effortful and prolonged with mastication and propulsion of solid bolus. Pt had a very curled short epiglottis that does not invert during the swallow. Her laryngeal elevation and mobility otherwise appear WNL. Her larynx is highly sensate to scope and PO. Her swallow response with all but her first swallows of ice were timely with minimal if any penetration or aspiration before the swallow. She triggers 3-4 swallows per sip and is typically left leaning, diverting the blous through the left side of valleculae and left lateral channel with residue there clearing over several swallows. There are instances of very trace penetration to the anterior portion of the glottis during mutliple swallows. Residue increases with viscosity but is never severe or persistent. Sips of liquids aid in clearance. Pt is recommended to resume home diet of dys 2 (finely chopped/soft) solids as tolerated with thin liquids. She needs to be upright in chair or in  chair position in bed to eat and drink safely. Given degenerative disease and debility, she does have increasing risk of aspiration given weak cough and breath support for pulmonary hygiene. Recommend SLP f/u for RMT, swallowing precautions and strategies as  needed.  SLP Visit Diagnosis Dysphagia, oropharyngeal phase (R13.12)  Impact on safety and function Moderate aspiration risk         03/27/2023    1:30 PM  Treatment Recommendations  Treatment Recommendations Therapy as outlined in treatment plan below        03/27/2023    1:30 PM  Prognosis  Prognosis for improved oropharyngeal function Fair  Barriers to Reach Goals Severity of deficits       03/27/2023    1:30 PM  Diet Recommendations  SLP Diet Recommendations Dysphagia 2 (Fine chop) solids;Thin liquid  Liquid Administration via Straw  Medication Administration Crushed with puree  Compensations Slow rate;Small sips/bites;Follow solids with liquid  Postural Changes Seated upright at 90 degrees;Remain semi-upright after after feeds/meals (Comment)         03/27/2023    1:30 PM  Other Recommendations  Follow Up Recommendations Home health SLP  Functional Status Assessment Patient has had a recent decline in their functional status and demonstrates the ability to make significant improvements in function in a reasonable and predictable amount of time.       03/27/2023    1:30 PM  Frequency and Duration   Speech Therapy Frequency (ACUTE ONLY) min 2x/week  Treatment Duration 2 weeks         03/27/2023    1:30 PM  Oral Phase  Oral Phase Impaired  Oral - Thin Straw Other (Comment)  Oral - Puree Delayed oral transit  Oral - Regular Decreased bolus cohesion;Delayed oral transit;Weak lingual manipulation       03/27/2023    1:30 PM  Pharyngeal Phase  Pharyngeal Phase Impaired  Pharyngeal- Thin Straw Trace aspiration;Reduced epiglottic inversion;Reduced tongue base retraction;Pharyngeal residue - valleculae;Lateral channel residue;Penetration/Apiration after swallow  Pharyngeal- Puree Reduced epiglottic inversion;Reduced tongue base retraction;Lateral channel residue;Pharyngeal residue - valleculae  Pharyngeal- Regular Reduced epiglottic inversion;Reduced tongue base  retraction;Lateral channel residue;Pharyngeal residue - valleculae        06/08/2021    2:18 PM  Cervical Esophageal Phase   Cervical Esophageal Phase [facility]     Persis Graffius, Riley Nearing 03/27/2023, 2:14 PM

## 2023-03-27 NOTE — Evaluation (Signed)
Occupational Therapy Evaluation Patient Details Name: [name] MRN: [id number] DOB: 06-08-1932 Today's Date: 03/27/2023   History of Present Illness [name] is an 89 y.o. female admitted 03/26/23 with R sided weakness. MRI Brain revealed unchanged small L thalamic ICH. PMH includes MS, osteoporosis, arthritis, R basal ganglia CVA with residual L sided weakness and temporal arteritis, R hip fx 2021 secondary to fall nonop, CHF, HTN.   Clinical Impression   Pt is typically dependent for bathing/dressing and hoyer lift to WC daily. She does feed herself, and enjoys doing her own grooming at the bathroom sink including skin care routine. New right sided weakness (dominant hand) and so she is requiring increased assist for functional tasks up to mod A for grooming and tasks that typically require BUE. RUE with grasp of 2/5, trace movement at elbow and shoulder currently.  Pt will benefit from skilled OT in the acute setting as well as return home with [facility] and 24/7 supervision to familiar environment. Next session to focus on establishing HEP for RUE, and OOB via lift.     Recommendations for follow up therapy are one component of a multi-disciplinary discharge planning process, led by the attending physician.  Recommendations may be updated based on patient status, additional functional criteria and insurance authorization.   Assistance Recommended at Discharge Frequent or constant Supervision/Assistance  Patient can return home with the following A lot of help with bathing/dressing/bathroom;Assistance with cooking/housework;Assistance with feeding;Assist for transportation;Help with stairs or ramp for entrance;Other (comment) (hoyer lift to [facility])    Functional Status Assessment  Patient has had a recent decline in their functional status and demonstrates the ability to make significant improvements in function in a reasonable and predictable amount of time.  Equipment  Recommendations  None recommended by OT (Pt has appropriate DME - potentially AE in home setting)    Recommendations for Other Services PT consult;Speech consult     Precautions / Restrictions Precautions Precautions: Fall Restrictions Weight Bearing Restrictions: No Other Position/Activity Restrictions: hoyer lift at baseline to [facility]      Mobility Bed Mobility Overal bed mobility: Needs Assistance Bed Mobility: Rolling Rolling: Max assist, +2 for safety/equipment, +2 for physical assistance         General bed mobility comments: rolling to the right max A as Pt bending knee and reaching across with LUE. max A +2 to the left as R side unable to assist at this time    Transfers Overall transfer level: Needs assistance Equipment used: Ambulation equipment used Transfers: Bed to chair/wheelchair/BSC             General transfer comment: Pt handled lift well. She is a Nurse, adult at baseline. She does get OOB daily via lift equipment. Transfer via Chemical engineer Overall balance assessment: Needs assistance Sitting-balance support: Bilateral upper extremity supported, Feet unsupported Sitting balance-Leahy Scale: Poor Sitting balance - Comments: able to pull forward chest into unsupported sitting with assist                                   ADL either performed or assessed with clinical judgement   ADL Overall ADL's : Needs assistance/impaired Eating/Feeding: Maximal assistance   Grooming: Minimal assistance Grooming Details (indicate cue type and reason): performed with LUE - typically R handed Upper Body Bathing: Maximal assistance   Lower Body Bathing: Total assistance   Upper Body Dressing :  Maximal assistance   Lower Body Dressing: Total assistance   Toilet Transfer: Total assistance Toilet Transfer Details (indicate cue type and reason): lift and diapers at baseline Toileting- Clothing Manipulation and Hygiene: Total  assistance     Tub/Shower Transfer Details (indicate cue type and reason): bed bath at baseline Functional mobility during ADLs:  (n/a) General ADL Comments: RUE deficits impacting typical level of function, decreased balance and sensation     Vision         Perception     Praxis      Pertinent Vitals/Pain Pain Assessment Pain Assessment: Faces Faces Pain Scale: Hurts little more Pain Location: R hip and knees Pain Descriptors / Indicators: Discomfort, Grimacing, Guarding Pain Intervention(s): Limited activity within patient's tolerance, Monitored during session, Repositioned (extra pillows)     Hand Dominance Right   Extremity/Trunk Assessment Upper Extremity Assessment Upper Extremity Assessment: RUE deficits/detail RUE Deficits / Details: grasp 2/5, no noted elbow or shoulder movement RUE Sensation: decreased light touch;decreased proprioception RUE Coordination: decreased fine motor;decreased gross motor   Lower Extremity Assessment Lower Extremity Assessment: Defer to PT evaluation   Cervical / Trunk Assessment Cervical / Trunk Assessment: Kyphotic   Communication Communication Communication: HOH;Expressive difficulties (slow dysarthric speech)   Cognition Arousal/Alertness: Awake/alert Behavior During Therapy: WFL for tasks assessed/performed Overall Cognitive Status: Within Functional Limits for tasks assessed                                 General Comments: Pt soft spoken and dysartric, but able to answer all questions and even joke around     General Comments  son present throughout session.    Exercises     Shoulder Instructions      Home Living Family/patient expects to be discharged to:: Private residence                                        Prior Functioning/Environment Prior Level of Function : Needs assist       Physical Assist : ADLs (physical);Mobility (physical) Mobility (physical): Bed  mobility;Transfers ADLs (physical): Bathing;Dressing;Toileting;IADLs Mobility Comments: hoyer lift to a WC daily ADLs Comments: dependent for bathing and dressing and IADL - she does her own feeding (grandaughter is an OT and has been unsuccesssful in getting her to use adaptive utensils), grooming, and face care routine        OT Problem List: Decreased strength;Decreased range of motion;Impaired balance (sitting and/or standing);Impaired sensation;Impaired UE functional use;Decreased coordination      OT Treatment/Interventions: Self-care/ADL training;Therapeutic exercise;Neuromuscular education;DME and/or AE instruction;Manual therapy;Therapeutic activities;Patient/family education;Balance training    OT Goals(Current goals can be found in the care plan section) Acute Rehab OT Goals Patient Stated Goal: be as independent as possible OT Goal Formulation: With patient Time For Goal Achievement: 04/10/23 Potential to Achieve Goals: Good ADL Goals Pt Will Perform Eating: with set-up;with adaptive utensils;sitting Pt Will Perform Grooming: with set-up;sitting;with adaptive equipment Pt/caregiver will Perform Home Exercise Program: Right Upper extremity;With minimal assist;With written HEP provided Additional ADL Goal #1: Pt will assist for rolling at bed level with mod A prior to engaging in ADL, or to assist with lift pad placement  OT Frequency: Min 2X/week    Co-evaluation PT/OT/SLP Co-Evaluation/Treatment: Yes Reason for Co-Treatment: Complexity of the patient's impairments (multi-system involvement);For patient/therapist safety;To address functional/ADL transfers;Necessary to address  cognition/behavior during functional activity PT goals addressed during session: Proper use of DME;Strengthening/ROM OT goals addressed during session: ADL's and self-care;Proper use of Adaptive equipment and DME;Strengthening/ROM      AM-PAC OT "6 Clicks" Daily Activity     Outcome Measure Help from  another person eating meals?: Total (NPO) Help from another person taking care of personal grooming?: A Lot Help from another person toileting, which includes using toliet, bedpan, or urinal?: Total Help from another person bathing (including washing, rinsing, drying)?: A Lot Help from another person to put on and taking off regular upper body clothing?: Total Help from another person to put on and taking off regular lower body clothing?: Total 6 Click Score: 8   End of Session Equipment Utilized During Treatment: Other (comment) Herma Mering) Nurse Communication: Mobility status;Need for lift equipment;Precautions  Activity Tolerance: Patient tolerated treatment well Patient left: in chair;with call bell/phone within reach;with chair alarm set;Other (comment) (working with SLP)  OT Visit Diagnosis: Muscle weakness (generalized) (M62.81);Other symptoms and signs involving the nervous system (R29.898);Hemiplegia and hemiparesis Hemiplegia - Right/Left: Right Hemiplegia - dominant/non-dominant: Dominant Hemiplegia - caused by: Other Nontraumatic intracranial hemorrhage                Time: 4098-1191 OT Time Calculation (min): 22 min Charges:  OT General Charges $OT Visit: 1 Visit OT Evaluation $OT Eval Moderate Complexity: 1 Mod  Nyoka Cowden OTR/L Acute Rehabilitation Services Office: [phone]  Emelda Fear 03/27/2023, 1:43 PM

## 2023-03-27 NOTE — Progress Notes (Signed)
  Echocardiogram 2D Echocardiogram has been performed.  Maren Reamer 03/27/2023, 12:03 PM

## 2023-03-27 NOTE — Evaluation (Signed)
Speech Language Pathology Evaluation Patient Details Name: [name] MRN: [id number] DOB: Jul 28, 1932 Today's Date: 03/27/2023 Time: 4098-1191 SLP Time Calculation (min) (ACUTE ONLY): 40 min  Problem List:  Patient Active Problem List   Diagnosis Date Noted   ICH (intracerebral hemorrhage) 03/26/2023   Wheelchair dependent 07/04/2022   Acquired hypothyroidism 06/06/2022   Age related osteoporosis 06/06/2022   Bilateral impacted cerumen 05/17/2022   Atrophic vaginitis 04/05/2022   Disorder of female genital organs 04/05/2022   Late effects of cerebrovascular disease 04/05/2022   Personal history of transient ischemic attack (TIA), and cerebral infarction without residual deficits 04/05/2022   Type 2 diabetes mellitus with proliferative diabetic retinopathy without macular edema, left eye 04/05/2022   S/P right hip fracture 07/19/2021   Stroke of right basal ganglia 06/08/2021   Acute CVA (cerebrovascular accident) 06/07/2021   Atherosclerotic heart disease of native coronary artery without angina pectoris 04/13/2021   Chronic edema 04/13/2021   Difficulty in walking, not elsewhere classified 04/13/2021   Frail elderly 04/13/2021   Giant cell arteritis 04/13/2021   Hypercholesterolemia 04/13/2021   Hypertensive heart disease without congestive heart failure 04/13/2021   Hypomagnesemia 04/13/2021   Osteoarthritis 04/13/2021   Protein calorie malnutrition 04/13/2021   Age-related osteoporosis without current pathological fracture 12/13/2020   Congestive heart failure 12/13/2020   Monoclonal paraproteinemia 12/13/2020   Multiple sclerosis 12/13/2020   Unspecified abnormal finding in specimens from other organs, systems and tissues 12/13/2020   Vitamin D deficiency 12/13/2020   Hyponatremia 04/09/2020   Acetabular fracture 04/01/2020   Closed right ischial fracture 04/01/2020   Hypertension 04/01/2020   Hypothyroidism 04/01/2020   Frequent falls 03/23/2020    Rheumatoid arthritis 03/23/2020   Tympanic membrane central perforation 12/15/2019   Cholesteatoma of external auditory canal, right 09/10/2019   Hearing loss 09/10/2019   Chronic venous insufficiency of lower extremity 01/25/2019   Grade II diastolic dysfunction 12/23/2018   Urinary dysfunction 12/22/2017   Multiple sclerosis, primary progressive 06/19/2017   Left foot drop 03/17/2017   Primary chronic progressive multiple sclerosis 03/17/2017   Myelopathy 03/17/2017   Bilateral lower extremity edema 03/17/2017   Past Medical History:  Past Medical History:  Diagnosis Date   Chronic venous insufficiency of lower extremity 01/25/2019   With chronic bilateral lower extremity edema   Incontinent of urine    Multiple sclerosis (HCC) 2015   Osteoporosis    osteopenia   Polyarthritis    Post-menopausal    Rheumatoid arthritis (HCC)    Temporal arteritis (HCC) 2011   Past Surgical History:  Past Surgical History:  Procedure Laterality Date   ABDOMINAL HYSTERECTOMY  1971   TVH   breast ductectomy Right 05/1995   COLONOSCOPY  10/2011   HPI:  Pt is an 89 yo female presenting with R sided weakness. MRI Brain revealed unchanged small L thalamic ICH. Pt seen by SLP 2022 for MBS with anterior spillage and aspiration with thin liquids. At that time, she was recommended a Dys 1 diet with nectar thick liquids and was eventually upgraded to a Dys 2 diet with nectar thick liquids before d/c. PMH includes MS, osteoporosis, arthritis, R basal ganglia CVA with residual L sided weakness and temporal arteritis   Assessment / Plan / Recommendation Clinical Impression  Pt's cognition and language were evaluated informally throughout a bedside swallowing evaluation. She reports no new cognitive changes, although states that her speech is different than baseline. Her cognition appears WFL as pt is able to attend to all functional tasks, request  repositioning due to pain, and participate in complex  conversations regarding her wishes. She presents with low vocal quality and mild dysarthria which greatly impacts her intelligibility. When cued to use the loudest volume possible, pt was only able to achieve marginal improvements. This suggests her loss of volume is due to generalized weakness, which may be improved with compensatory strategies and respiratory muscle training. Pt may benefit from ongoing [facility] SLP due to the degenerative nature of her MS. SLP will continue to f/u acutely to address speech intelligibility as able.    SLP Assessment  SLP Recommendation/Assessment: Patient needs continued Speech Lanaguage Pathology Services SLP Visit Diagnosis: Dysphagia, unspecified (R13.10);Dysarthria and anarthria (R47.1)    Recommendations for follow up therapy are one component of a multi-disciplinary discharge planning process, led by the attending physician.  Recommendations may be updated based on patient status, additional functional criteria and insurance authorization.    Follow Up Recommendations  Home health SLP    Assistance Recommended at Discharge  Frequent or constant Supervision/Assistance  Functional Status Assessment Patient has had a recent decline in their functional status and demonstrates the ability to make significant improvements in function in a reasonable and predictable amount of time.  Frequency and Duration min 2x/week  2 weeks      SLP Evaluation Cognition  Overall Cognitive Status: Within Functional Limits for tasks assessed Arousal/Alertness: Awake/alert Orientation Level: Oriented X4       Comprehension  Auditory Comprehension Overall Auditory Comprehension: Appears within functional limits for tasks assessed Visual Recognition/Discrimination Discrimination: Not tested Reading Comprehension Reading Status: Not tested    Expression Expression Primary Mode of Expression: Verbal Verbal Expression Overall Verbal Expression: Appears within functional  limits for tasks assessed   Oral / Motor  Oral Motor/Sensory Function Overall Oral Motor/Sensory Function: Moderate impairment Facial ROM: Reduced left Facial Symmetry: Abnormal symmetry left Facial Strength: Reduced left Lingual ROM: Reduced left Lingual Symmetry: Abnormal symmetry left Lingual Strength: Reduced Motor Speech Overall Motor Speech: Impaired Respiration: Impaired Level of Impairment: Phrase Phonation: Low vocal intensity Resonance: Within functional limits Articulation: Within functional limitis Intelligibility: Intelligibility reduced Sentence: 25-49% accurate Motor Planning: Witnin functional limits            Brunilda Payor., Student SLP  03/27/2023, 11:06 AM

## 2023-03-27 NOTE — Progress Notes (Signed)
PT Cancellation Note  Patient Details Name: [name] MRN: [id number] DOB: [birth year]   Cancelled Treatment:    Reason Eval/Treat Not Completed: Active bedrest order remains this morning that is not set to expire until 19:14 today. PT will continue to follow but hold on initial evaluation until after bedrest expires or until otherwise directed by MD.   Vickki Muff, PT, DPT   Acute Rehabilitation Department Office [phone] Secure Chat Communication Preferred    Ronnie Derby 03/27/2023, 7:58 AM

## 2023-03-27 NOTE — Progress Notes (Signed)
Pt with speech. 

## 2023-03-28 DIAGNOSIS — I10 Essential (primary) hypertension: Secondary | ICD-10-CM

## 2023-03-28 DIAGNOSIS — G35 Multiple sclerosis: Secondary | ICD-10-CM

## 2023-03-28 DIAGNOSIS — I612 Nontraumatic intracerebral hemorrhage in hemisphere, unspecified: Secondary | ICD-10-CM

## 2023-03-28 DIAGNOSIS — E039 Hypothyroidism, unspecified: Secondary | ICD-10-CM | POA: Diagnosis not present

## 2023-03-28 DIAGNOSIS — S32401S Unspecified fracture of right acetabulum, sequela: Secondary | ICD-10-CM

## 2023-03-28 DIAGNOSIS — E7800 Pure hypercholesterolemia, unspecified: Secondary | ICD-10-CM | POA: Diagnosis not present

## 2023-03-28 LAB — CBC WITH DIFFERENTIAL/PLATELET
Abs Immature Granulocytes: 0.01 10*3/uL (ref 0.00–0.07)
Basophils Absolute: 0 10*3/uL (ref 0.0–0.1)
Basophils Relative: 1 %
Eosinophils Absolute: 0.8 10*3/uL — ABNORMAL HIGH (ref 0.0–0.5)
Eosinophils Relative: 15 %
HCT: 35.7 % — ABNORMAL LOW (ref 36.0–46.0)
Hemoglobin: 12.5 g/dL (ref 12.0–15.0)
Immature Granulocytes: 0 %
Lymphocytes Relative: 37 %
Lymphs Abs: 2.1 10*3/uL (ref 0.7–4.0)
MCH: 32.1 pg (ref 26.0–34.0)
MCHC: 35 g/dL (ref 30.0–36.0)
MCV: 91.8 fL (ref 80.0–100.0)
Monocytes Absolute: 0.6 10*3/uL (ref 0.1–1.0)
Monocytes Relative: 10 %
Neutro Abs: 2.1 10*3/uL (ref 1.7–7.7)
Neutrophils Relative %: 37 %
Platelets: 201 10*3/uL (ref 150–400)
RBC: 3.89 MIL/uL (ref 3.87–5.11)
RDW: 13.6 % (ref 11.5–15.5)
WBC: 5.6 10*3/uL (ref 4.0–10.5)
nRBC: 0 % (ref 0.0–0.2)

## 2023-03-28 LAB — COMPREHENSIVE METABOLIC PANEL
ALT: 20 U/L (ref 0–44)
AST: 27 U/L (ref 15–41)
Albumin: 3 g/dL — ABNORMAL LOW (ref 3.5–5.0)
Alkaline Phosphatase: 73 U/L (ref 38–126)
Anion gap: 6 (ref 5–15)
BUN: 11 mg/dL (ref 8–23)
CO2: 24 mmol/L (ref 22–32)
Calcium: 8.9 mg/dL (ref 8.9–10.3)
Chloride: 105 mmol/L (ref 98–111)
Creatinine, Ser: 0.58 mg/dL (ref 0.44–1.00)
GFR, Estimated: >60 mL/min (ref >60)
Glucose, Bld: 99 mg/dL (ref 70–99)
Potassium: 3.9 mmol/L (ref 3.5–5.1)
Sodium: 135 mmol/L (ref 135–145)
Total Bilirubin: 0.7 mg/dL (ref 0.3–1.2)
Total Protein: 6.2 g/dL — ABNORMAL LOW (ref 6.5–8.1)

## 2023-03-28 LAB — PHOSPHORUS: Phosphorus: 3.1 mg/dL (ref 2.5–4.6)

## 2023-03-28 LAB — MAGNESIUM: Magnesium: 1.9 mg/dL (ref 1.7–2.4)

## 2023-03-28 NOTE — Assessment & Plan Note (Signed)
Continue atorvastatin

## 2023-03-28 NOTE — Progress Notes (Signed)
  Transition of Care [facility]) Screening Note   Patient Details  Name: [name] Date of Birth: 1932/10/14   [facility] CM/SW Contact:    Mearl Latin, LCSW Phone Number: 03/28/2023, 11:24 AM    Transition of Care Department [facility]) has reviewed patient with a home health consult. We will continue to monitor patient advancement through interdisciplinary progression rounds. If new patient transition needs arise, please place a TOC consult.

## 2023-03-28 NOTE — Hospital Course (Addendum)
[name] is an 89 y.o. F with MS (not on disease specific therapy currently), hx GCA, and hx stroke who presented with acute right sided weakness.  In the ER, found to have acute left thalamic ICH.

## 2023-03-28 NOTE — Assessment & Plan Note (Signed)
Follows with Dr. Epimenio Foot.  Not currently on disease specific therapy. - Continue baclofen

## 2023-03-28 NOTE — Progress Notes (Signed)
Speech Language Pathology Treatment: Dysphagia  Patient Details Name: [name] MRN: [id number] DOB: [birth year] Today's Date: 03/28/2023 Time: 0940-1000 SLP Time Calculation (min) (ACUTE ONLY): 20 min  Assessment / Plan / Recommendation Clinical Impression  Pt seen briefly this am. Pt had been noted to cough by RN yesterday who thickened pts liquids a bit with decreased coughing. Today pt needed a bit of cueing to sip with control and swallow several times as was needed in FEES. Overall, pt could choose to use thickener at home if it made her more comfortable. Her function may easily fluctuate based on medication, endurance and position. Discussed results of FEES and benefit of f/u with [name] SLP for respiratory muscle strength training and compensations as needed with daughter.   HPI HPI: Pt is an 89 yo female presenting with R sided weakness. MRI Brain revealed unchanged small L thalamic ICH. Pt seen by SLP 2022 for MBS with anterior spillage and aspiration with thin liquids. At that time, she was recommended a Dys 1 diet with nectar thick liquids and was eventually upgraded to a Dys 2 diet with nectar thick liquids before d/c. PMH includes MS, osteoporosis, arthritis, R basal ganglia CVA with residual L sided weakness and temporal arteritis      SLP Plan  Continue with current plan of care      Recommendations for follow up therapy are one component of a multi-disciplinary discharge planning process, led by the attending physician.  Recommendations may be updated based on patient status, additional functional criteria and insurance authorization.    Recommendations  Diet recommendations: Dysphagia 1 (puree);Thin liquid Liquids provided via: Straw Compensations: Slow rate;Small sips/bites;Follow solids with liquid Postural Changes and/or Swallow Maneuvers: Seated upright 90 degrees                              Continue with current plan of care     Lenny Bouchillon,  Riley Nearing  03/28/2023, 2:42 PM

## 2023-03-28 NOTE — TOC Initial Note (Addendum)
Transition of Care [facility]) - Initial/Assessment Note    Patient Details  Name: [name] MRN: [id number] Date of Birth: 20-Dec-1931  Transition of Care [facility]) CM/SW Contact:    Lockie Pares, RN Phone Number: 03/28/2023, 1:37 PM  Clinical Narrative:                 Spoke to daughter via phone who was in the room with patient regarding discharge planning. Patient has had home health in the past with [facility], was DC 02/11/2023. She has 24/7 caregivers and has electric  scooter and a Nurse, adult.  Daughter is interested in any and all HH. Recommended PT OT  RN and social work. Discussed transportation upon DC, they use a  transportation company to get her to appointments. They use [employer] [facility] and would prefer this over [facility].  Called Leah Prior at [facility] for weekend availability.  Set up transport with them for 1400 03/29/2023 their number is [phone]. Daughter will self pay for this service.  Morrie Sheldon from [facility] called and she accepted for home health. She will call daughter to discuss, as she had a question about what the RN does for medication management. Dr Maryfrances Bunnell aware of conversation and has placed orders for [facility] 1450 call back from [facility] transport. The director will be available tomorrow if there is a change in time or if the patient does not discharge  [phone]. They have set time for 2pm 4/20  TOC will follow for needs, recommendations, and transitions of care    Barriers to Discharge: Continued Medical Work up   Patient Goals and CMS Choice     Choice offered to / list presented to : Adult Children      Expected Discharge Plan and Services                                   HH Arranged: PT, OT, RN, Social Work [facility] Agency: [facility]) Date HH Agency Contacted: 03/28/23 Time HH Agency Contacted: 1336 Representative spoke with at [facility] Agency: Morrie Sheldon  Prior Living Arrangements/Services                       Activities of Daily  Living      Permission Sought/Granted                  Emotional Assessment              Admission diagnosis:  ICH (intracerebral hemorrhage) [I61.9] Patient Active Problem List   Diagnosis Date Noted   Acute left thalamic ICH (intracerebral hemorrhage) 03/26/2023   Wheelchair dependent 07/04/2022   Acquired hypothyroidism 06/06/2022   Age related osteoporosis 06/06/2022   Bilateral impacted cerumen 05/17/2022   Atrophic vaginitis 04/05/2022   Disorder of female genital organs 04/05/2022   Late effects of cerebrovascular disease 04/05/2022   Personal history of transient ischemic attack (TIA), and cerebral infarction without residual deficits 04/05/2022   Type 2 diabetes mellitus with proliferative diabetic retinopathy without macular edema, left eye 04/05/2022   S/P right hip fracture 07/19/2021   Stroke of right basal ganglia 06/08/2021   Acute CVA (cerebrovascular accident) 06/07/2021   Atherosclerotic heart disease of native coronary artery without angina pectoris 04/13/2021   Chronic edema 04/13/2021   Difficulty in walking, not elsewhere classified 04/13/2021   Frail elderly 04/13/2021   Giant cell arteritis 04/13/2021   Hypercholesterolemia 04/13/2021  Hypertensive heart disease without congestive heart failure 04/13/2021   Hypomagnesemia 04/13/2021   Osteoarthritis 04/13/2021   Protein calorie malnutrition 04/13/2021   Age-related osteoporosis without current pathological fracture 12/13/2020   Congestive heart failure 12/13/2020   Monoclonal paraproteinemia 12/13/2020   Multiple sclerosis 12/13/2020   Unspecified abnormal finding in specimens from other organs, systems and tissues 12/13/2020   Vitamin D deficiency 12/13/2020   Hyponatremia 04/09/2020   Acetabular fracture 04/01/2020   Closed right ischial fracture 04/01/2020   Hypertension 04/01/2020   Hypothyroidism 04/01/2020   Frequent falls 03/23/2020   Rheumatoid arthritis 03/23/2020   Tympanic  membrane central perforation 12/15/2019   Cholesteatoma of external auditory canal, right 09/10/2019   Hearing loss 09/10/2019   Chronic venous insufficiency of lower extremity 01/25/2019   Grade II diastolic dysfunction 12/23/2018   Urinary dysfunction 12/22/2017   Multiple sclerosis, primary progressive 06/19/2017   Left foot drop 03/17/2017   Primary chronic progressive multiple sclerosis 03/17/2017   Myelopathy 03/17/2017   Bilateral lower extremity edema 03/17/2017   PCP:  Blair Heys, MD Pharmacy:   [facility] [place], [place] - [address] [place] [address] Phone: [phone] Fax: [phone]     Social Determinants of Health (SDOH) Social History: SDOH Screenings   Food Insecurity: No Food Insecurity (06/26/2021)  Housing: Low Risk  (06/18/2021)  Transportation Needs: No Transportation Needs (06/18/2021)  Depression (PHQ2-9): Low Risk  (06/26/2021)  Tobacco Use: Low Risk  (11/19/2022)   SDOH Interventions:     Readmission Risk Interventions     No data to display

## 2023-03-28 NOTE — Progress Notes (Signed)
  Progress Note   Patient: [name] ZOX:[id number] DOB: 04-24-1932 DOA: 03/26/2023     2 DOS: the patient was seen and examined on 03/28/2023        Brief hospital course: Mrs. Klomp is an 89 y.o. F with MS (not on disease specific therapy currently), hx GCA, and hx stroke who presented with acute right sided weakness.  In the ER, found to have acute left thalamic ICH.     Assessment and Plan: * Acute left thalamic ICH (intracerebral hemorrhage) Small volume, likely hypertensive.  Repeat CT on 4/18 unchanged. -Non-invasive angiography showed no high grade stenoses -Echocardiogram showed no cardiogenic source of embolism -Carotid imaging unremarkable   -Lipids ordered: LDL 72, on Lipitor 80 -Aspirin discontinued due to hemorrhage -Atrial fibrillation: not present -tPA not given because hemorrhage -Dysphagia screen ordered in ER -PT eval ordered: recommended HHPT      Acquired hypothyroidism - Continue levothyroxine  Hypercholesterolemia - Continue atorvastatin  Multiple sclerosis Follows with Dr. Epimenio Foot.  Not currently on disease specific therapy. - Continue baclofen  Hypertension No prior diagnosis.  Here, BP initially normal, increased to 170s and so Cleviprex started. - Continue new losartan and amlodipine    Acetabular fracture Sustained from a fall while in rehab after a stroke.  Has been wheelchair bound since then.  Rheumatoid arthritis, ruled out Evidently on second opinion, the patient does not have RA.  See 05/10/21 note.          Subjective: Patient still quite weak, feeling slightly better than yesterday, able to sit up in chair yesterday, no fever, no respiratory distress.     Physical Exam: BP (!) 143/76 (BP Location: Right Arm)   Pulse 68   Temp 98.1 F (36.7 C) (Oral)   Resp 15   Wt 55.6 kg   LMP 09/08/1970 (Approximate)   SpO2 96%   BMI 20.40 kg/m   Frail elderly female, lying in bed, appears weak and tired RRR,  no murmurs, no peripheral edema Respiratory normal, lungs clear without rales or wheezes, although exam very limited due to weak inspiration Abdomen without grimace to palpation, no distention or pain Voice hypophonic, face seems to have puffiness on the left, Appreciate significant focal droop, she is very dysarthric, appears to me to have contractures in all 4 extremities, and severe generalized weakness, but oriented to place, situation, family    Data Reviewed: Basic metabolic panel and LFTs normal LDL 72 CBC unremarkable Hemoglobin A1c 5.2%    Family Communication: Son and daughter at the bedside    Disposition: Status is: Inpatient         Author: Alberteen Sam, MD 03/28/2023 4:21 PM  For on call review [email or web address].

## 2023-03-28 NOTE — Assessment & Plan Note (Signed)
Small volume, likely hypertensive.  Repeat CT on 4/18 unchanged. -Non-invasive angiography showed no high grade stenoses -Echocardiogram showed no cardiogenic source of embolism -Carotid imaging unremarkable   -Lipids ordered: LDL 72, on Lipitor 80 -Aspirin discontinued due to hemorrhage -Atrial fibrillation: not present -tPA not given because hemorrhage -Dysphagia screen ordered in ER -PT eval ordered: recommended HHPT

## 2023-03-28 NOTE — Assessment & Plan Note (Signed)
No prior diagnosis.  Here, BP initially normal, increased to 170s and so Cleviprex started. - Continue new losartan and amlodipine

## 2023-03-28 NOTE — Assessment & Plan Note (Signed)
Sustained from a fall while in rehab after a stroke.  Has been wheelchair bound since then.

## 2023-03-28 NOTE — Assessment & Plan Note (Addendum)
Evidently on second opinion, the patient does not have RA.  See 05/10/21 note.

## 2023-03-28 NOTE — Progress Notes (Addendum)
STROKE TEAM PROGRESS NOTE   INTERVAL HISTORY Her son and daughter is at the bedside.  Patient is awake alert sitting up in bed. BP still a little high will keep for another day or so and adjust medications  No new neurological events overnight   Vitals:   03/28/23 0600 03/28/23 0700 03/28/23 0800 03/28/23 1200  BP: (!) 157/58 (!) 144/62 (!) 159/66 124/67  Pulse: (!) 53 (!) 54 (!) 57 67  Resp: Temp:   98.4 F (36.9 C) 98.4 F (36.9 C)  TempSrc:   Oral Oral  SpO2: 96% 97% 97% 96%  Weight:       CBC:  Recent Labs  Lab 03/26/23 1839 03/26/23 1852 03/27/23 0431 03/28/23 0350  WBC 7.6  --  6.2 5.6  NEUTROABS 5.4  --   --  2.1  HGB 14.3   < > 12.6 12.5  HCT 42.6   < > 35.6* 35.7*  MCV 95.5  --  90.8 91.8  PLT 141*  --  197 201   < > = values in this interval not displayed.    Basic Metabolic Panel:  Recent Labs  Lab 03/27/23 0431 03/28/23 0350  NA 132* 135  K 3.7 3.9  CL 103 105  CO2 22 24  GLUCOSE 84 99  BUN 14 11  CREATININE 0.63 0.58  CALCIUM 8.8* 8.9  MG  --  1.9  PHOS  --  3.1    Lipid Panel:  Recent Labs  Lab 03/27/23 0431  CHOL 143  TRIG 41  HDL 63  CHOLHDL 2.3  VLDL 8  LDLCALC 72    HgbA1c:  Recent Labs  Lab 03/27/23 0431  HGBA1C 5.2    Urine Drug Screen: No results for input(s): "LABOPIA", "COCAINSCRNUR", "LABBENZ", "AMPHETMU", "THCU", "LABBARB" in the last 168 hours.  Alcohol Level  Recent Labs  Lab 03/26/23 2140  ETH <10     IMAGING past 24 hours DG HIP UNILAT WITH PELVIS 2-3 VIEWS RIGHT  Result Date: 03/27/2023 CLINICAL DATA:  Right hip pain and prior fracture. EXAM: DG HIP (WITH OR WITHOUT PELVIS) 2-3V RIGHT COMPARISON:  01/02/2021 FINDINGS: Continued marked deformity of the right femoral head and right acetabulum with flattened, irregular, and expanded deformity of the right acetabulum; chronic resorption of most of the femoral head; and proximal migration of the right femur such that the greater trochanter sits  about 6.3 cm above the right greater trochanter. Overall these deformities are only minimally progressive compared to 01/02/2021. Chronic deformity of the left pubic rami from old fractures. This deformity is not changed from 01/02/2021. IMPRESSION: 1. Very minimal progression of marked deformity of the right femoral head and right acetabulum compared to 01/02/2021. 2. Chronic deformity of the left pubic rami from old fractures. 3. No acute findings. Electronically Signed   By: Gaylyn Rong M.D.   On: 03/27/2023 20:51    PHYSICAL EXAM  Neuro - awake alert, orientated to time, place, people but not to age, soft voice, hypophonia.  No aphasia but paucity of speech, follows simple commands.  Blinking to visual threat bilaterally, no gaze palsy, mild right facial droop, right upper extremity 2-/5 and finger movement 2+/5. LUE at least 3/5.  Bilateral lower extremity slight withdraw with pain. Sensation, coordination not quite cooperative and gait not tested.   ASSESSMENT/PLAN [name] is a 89 y.o. female with history of Multiple sclerosis, osteoporosis, arthritis, right basal ganglia stroke with residual left sided weakness, on  aspirin  daily presenting with sudden onset right sided weakness, LKW 0900 4/17. NIH 12 on ED arrival examination. CT head reveals an acute left thalamic/basal ganglia ICH. Improved exam.  Patient uses Hoyer lift and wheelchair to get around at home.  This is after previous stroke and a hip fracture at rehab afterwards.   ICH - Acute left thalamic/basal ganglia ICH, likely hypertensive small vessel ICH Code Stroke CT head: Acute hemorrhage centered in the left thalamus/basal ganglia, with an approximate volume of 0.6 mL. Surrounding edema without significant mass effect or midline shift  CTA head & neck: No emergent large vessel occlusion or high-grade stenosis of the intracranial arteries. Unchanged small left thalamic hemorrhage. 3.9 cm ascending aortic  aneurysm  MRI:  Unchanged appearance of small intraparenchymal hematoma in the left thalamus. No new hemorrhage or mass effect. No acute ischemia or mass lesion. Repeat CT 4/18: Unchanged appearance of left thalamus intraparenchymal hematoma with surrounding edema. No new site of hemorrhage 2D Echo: LVEF 60 to 65%  LDL 72 HgbA1c 5.2 VTE prophylaxis -heparin subcu aspirin 81 mg daily prior to admission, now on No antithrombotic due to ICH.  Therapy recommendations: Home health PT/OT Disposition: Pending  History of stroke TIA in 2010 05/2021 left-sided weakness.  CT negative.  MRI showed right CR infarcts.  CTA head and neck unremarkable.  EF 60 to 65%.  LDL 156, A1c 5.4.  Discharged on DAPT and Lipitor 80.  Hypertension Home meds:  none Systolic blood pressure goal < 160 use hydralazine IV as needed  Add amlodipine 10 and Cozaar 50 Long-term BP goal normotensive  Hyperlipidemia Home meds:  Lipitor , resumed in hospital LDL 72, goal < 70 Continue statin at discharge  Other Stroke Risk Factors Advanced Age >/= 52  Family hx stroke (father) CAD  Other Active Problems Multiple Sclerosis, followed with Dr. Epimenio Foot Rheumatoid Arthritis Osteoarthritis Mild hyponatremia, sodium 132-> 135 Hx of R leg fracture Right hip fracture experienced after prior stroke Right femoral fracture after stroke when working with PT Since then patient literally bedbound Xray minimal progression of marked deformity of the right femoral head and right acetabulum. Chronic deformity of the left pubic rami from old fractures.    Hospital day # 2  Pt seen by Neuro NP/APP and later by MD. Note/plan to be edited by MD as needed.    Gevena Mart DNP, ACNPC-AG  [facility]  ATTENDING NOTE: I reviewed above note and agree with the assessment and plan. Pt was seen and examined.   Son and daughter are at bedside.  Patient lying in bed, lethargic, however more interactive, awake alert,  orientated to place, people, time but not to age. Still has mild right facial droop and right arm near plegia.  Bilateral lower extremity mild withdraw to command.  Patient BP still 150s to 160s, will discuss with [name] to adjust BP meds for long-term BP goal normotensive.  PT therapy recommend home health.  Continue statin on discharge.  She follows with Dr. Epimenio Foot at [facility].  For detailed assessment and plan, please refer to above/below as I have made changes wherever appropriate.   Neurology will sign off. Please call with questions. Pt will follow up with Dr. Epimenio Foot at [facility] in about 4 weeks. Thanks for the consult.   Marvel Plan, MD PhD Stroke Neurology 03/28/2023 8:47 PM    To contact Stroke Continuity provider, please refer to [email or web address]. After hours, contact General Neurology

## 2023-03-28 NOTE — Assessment & Plan Note (Signed)
Continue levothyroxine 

## 2023-03-29 DIAGNOSIS — Z515 Encounter for palliative care: Secondary | ICD-10-CM

## 2023-03-29 DIAGNOSIS — I612 Nontraumatic intracerebral hemorrhage in hemisphere, unspecified: Secondary | ICD-10-CM | POA: Diagnosis not present

## 2023-03-29 DIAGNOSIS — Z7189 Other specified counseling: Secondary | ICD-10-CM | POA: Diagnosis not present

## 2023-03-29 MED ORDER — AMLODIPINE BESYLATE 10 MG PO TABS: 10.0000 mg | ORAL_TABLET | Freq: Every day | ORAL | 3 refills | Status: DC

## 2023-03-29 MED ORDER — LOSARTAN POTASSIUM 50 MG PO TABS: 50.0000 mg | ORAL_TABLET | Freq: Every day | ORAL | 3 refills | Status: AC

## 2023-03-29 NOTE — Discharge Summary (Addendum)
Physician Discharge Summary   Patient: [name] MRN: [id number] DOB: 28-Jul-1932  Admit date:     03/26/2023  Discharge date: 03/29/23  Discharge Physician: Alberteen Sam   PCP: Blair Heys, MD     Recommendations at discharge:  Follow up with Neurology for intracerebral hemorrhage Follow up with PCP Dr. Manus Gunning in 1 week Dr. Manus Gunning: Please check BP on new meds and adjust as appropriate  Follow up with [facility] for home palliative care follow up     Discharge Diagnoses: Principal Problem:   Acute left thalamic ICH (intracerebral hemorrhage) Active Problems:   Acetabular fracture   Multiple sclerosis   Hypertension   Hypercholesterolemia   Acquired hypothyroidism   Rheumatoid arthritis, ruled out      Hospital Course: [name] is an 89 y.o. F with MS (not on disease specific therapy currently), hx GCA, and hx stroke who presented with acute right sided weakness.  In the ER, found to have acute left thalamic ICH.     * Acute left thalamic ICH (intracerebral hemorrhage) Small volume, likely hypertensive.  Admitted to ICU for monitoring.  Repeat CT on 4/18 unchanged.  Paitent did require 24 hours Cleviprex for hypertension, and was started on amlodipine and losartan for BP persistently above 170.  -Non-invasive angiography showed no high grade stenoses -Echocardiogram showed no cardiogenic source of embolism -Carotid imaging unremarkable   -Lipids ordered: LDL 72, on Lipitor 80 -Aspirin discontinued due to hemorrhage -Atrial fibrillation: not present -tPA not given because hemorrhage -Dysphagia screen ordered in ER -PT eval ordered: recommended HHPT  -Given patient's progressive disability, her new stroke and uncertainty about the impact on her quality of life going forward, Palliative Care referral was made prior to discharge for home follow up      Acquired hypothyroidism On  levothyroxine  Hypercholesterolemia On atorvastatin  Multiple sclerosis Follows with Dr. Epimenio Foot.  Not currently on disease specific therapy.  Hypertension No prior diagnosis.  Here, BP initially normal, increased to 170s and so Cleviprex started.  Discharged on new losartan and amlodipine    Acetabular fracture Sustained from a fall while in rehab after a stroke.  Has been essentially wheelchair bound since then.  Rheumatoid arthritis, ruled out Evidently on second opinion, the patient does not have RA.  See 05/10/21 note.            The [facility] Controlled Substances Registry was reviewed for this patient prior to discharge.  Consultants: Neurology   Disposition: Home health Diet recommendation:  Discharge Diet Orders (From admission, onward)     Start     Ordered   03/29/23 0000  Diet - low sodium heart healthy        03/29/23 1301             DISCHARGE MEDICATION: Allergies as of 03/29/2023       Reactions   Sulfasalazine Anaphylaxis   Aspirin Nausea Only   Penicillins Diarrhea   Sulfa Antibiotics    Sulfamethoxazole-trimethoprim Other (See Comments)        Medication List     STOP taking these medications    aspirin EC 81 MG tablet       TAKE these medications    acetaminophen 325 MG tablet Commonly known as: TYLENOL Take 2 tablets (650 mg total) by mouth every 6 (six) hours as needed for mild pain (or temp > 37.5 C (99.5 F)).   amLODipine 10 MG tablet Commonly known as: NORVASC Take  1 tablet (10 mg total) by mouth daily. Start taking on: March 30, 2023   atorvastatin 80 MG tablet Commonly known as: LIPITOR Take 1 tablet (80 mg total) by mouth daily.   baclofen 10 MG tablet Commonly known as: LIORESAL One po qAM, one po qPM and 2 po qHS What changed:  when to take this additional instructions   Calcium-Vitamin D3 250-125 MG-UNIT Tabs Take 1 tablet by mouth daily.   levothyroxine 25 MCG tablet Commonly known as:  SYNTHROID Take 1 tablet (25 mcg total) by mouth daily.   losartan 50 MG tablet Commonly known as: COZAAR Take 1 tablet (50 mg total) by mouth daily. Start taking on: March 30, 2023   LUBRICATING EYE DROPS OP Place 1 drop into both eyes 2 (two) times daily. For Eye Dryness   Magnesium 250 MG Tabs Take 1 tablet by mouth daily.   nabumetone 500 MG tablet Commonly known as: RELAFEN TAKE ONE TABLET BY MOUTH TWICE DAILY AS NEEDED What changed: reasons to take this   Vitamin D (Ergocalciferol) 1.25 MG (50000 UNIT) Caps capsule Commonly known as: DRISDOL Take 50,000 Units by mouth every 7 (seven) days.        Follow-up Information     [name], [name], MD. Schedule an appointment as soon as possible for a visit in 1 month(s).   Specialty: Neurology Contact information: [address] [place] [address] [phone]                 Discharge Instructions     Ambulatory referral to Neurology   Complete by: As directed    Follow up with Dr. Epimenio Foot at [facility] in 4-6 weeks. Pt is [name] pt. Thanks.   Diet - low sodium heart healthy   Complete by: As directed    Discharge instructions   Complete by: As directed    **IMPORTANT DISCHARGE INSTRUCTIONS**   From [name]: You were admitted for stroke This stroke was caused by bleeding in the brain, specifically a small intraparenchymal hematoma in the left thalamus  Management of this should include keeping the blood pressure less than 140/90 mmHg if possible  Start the new blood pressure medicines amlodipine 10 mg daily and losartan 50 mg daily  Follow up with Dr. Epimenio Foot   Follow up with Dr. Manus Gunning in 1 week for blood pressure check and adjustment of medicines  In the meantime, I recommend checking blood pressure once or twice daily If you see the blood pressure LESS than 110 for the top number, I would hold the losartan If it is STILL less than 110, break the amlodipine in half  A single blood pressure  above 160 is not a cause for alarm, but sustained blood pressure above 160 would be reason to call Dr. Randel Books office     Increase activity slowly   Complete by: As directed        Discharge Exam: Filed Weights   03/26/23 1843  Weight: 55.6 kg    General: Pt is alert, awake, not in acute distress, very dysarthric Cardiovascular: RRR, nl S1-S2, no murmurs appreciated.   No LE edema.   Respiratory: Normal respiratory rate and rhythm.  CTAB without rales or wheezes. Abdominal: Abdomen soft and non-tender.  No distension or HSM.   Neuro/Psych: Contractured, weak but hard for me to discern L>R weakness.  Attentive, oriented.     Condition at discharge: stable  The results of significant diagnostics from this hospitalization (including imaging, microbiology, ancillary and  laboratory) are listed below for reference.   Imaging Studies: DG HIP UNILAT WITH PELVIS 2-3 VIEWS RIGHT  Result Date: 03/27/2023 CLINICAL DATA:  Right hip pain and prior fracture. EXAM: DG HIP (WITH OR WITHOUT PELVIS) 2-3V RIGHT COMPARISON:  01/02/2021 FINDINGS: Continued marked deformity of the right femoral head and right acetabulum with flattened, irregular, and expanded deformity of the right acetabulum; chronic resorption of most of the femoral head; and proximal migration of the right femur such that the greater trochanter sits about 6.3 cm above the right greater trochanter. Overall these deformities are only minimally progressive compared to 01/02/2021. Chronic deformity of the left pubic rami from old fractures. This deformity is not changed from 01/02/2021. IMPRESSION: 1. Very minimal progression of marked deformity of the right femoral head and right acetabulum compared to 01/02/2021. 2. Chronic deformity of the left pubic rami from old fractures. 3. No acute findings. Electronically Signed   By: Gaylyn Rong M.D.   On: 03/27/2023 20:51   ECHOCARDIOGRAM COMPLETE  Result Date: 03/27/2023     ECHOCARDIOGRAM REPORT   Patient Name:   LETICIA COLETTA [facility] Date of Exam: 03/27/2023 Medical Rec #:  [id number]              Height:       65.0 in Accession #:    [id number]             Weight:       122.6 lb Date of Birth:  06-03-1932              BSA:          1.607 m Patient Age:    89 years               BP:           139/54 mmHg Patient Gender: F                      HR:           54 bpm. Exam Location:  Inpatient Procedure: 2D Echo, Color Doppler and Cardiac Doppler Indications:    Stroke I63.9  History:        Patient has prior history of Echocardiogram examinations, most                 recent 06/08/2021. CHF, Stroke; Risk Factors:Hypertension,                 Dyslipidemia and Diabetes.  Sonographer:    Aron Baba Referring Phys: [id number] CORTNEY E DE LA TORRE  Sonographer Comments: Image acquisition challenging due to patient body habitus and Image acquisition challenging due to respiratory motion. IMPRESSIONS  1. Left ventricular ejection fraction, by estimation, is 60 to 65%. The left ventricle has normal function. The left ventricle has no regional wall motion abnormalities. Left ventricular diastolic parameters are consistent with Grade I diastolic dysfunction (impaired relaxation). Elevated left ventricular end-diastolic pressure.  2. Right ventricular systolic function is normal. The right ventricular size is normal. There is normal pulmonary artery systolic pressure. The estimated right ventricular systolic pressure is 31.4 mmHg.  3. Left atrial size was moderately dilated.  4. The mitral valve is abnormal. Trivial mitral valve regurgitation.  5. The aortic valve is tricuspid. Aortic valve regurgitation trivial to mild. Aortic valve sclerosis/calcification is present, without any evidence of aortic stenosis. Aortic regurgitation PHT measures 808 msec.  6. Aortic dilatation noted. There is mild dilatation of the ascending aorta, measuring  40 mm.  7. The inferior vena cava is normal in size with <50%  respiratory variability, suggesting right atrial pressure of 8 mmHg. Comparison(s): Changes from prior study are noted. 06/08/2021: LVEF 60-65%, mild MR, elevated LVEDP. FINDINGS  Left Ventricle: Left ventricular ejection fraction, by estimation, is 60 to 65%. The left ventricle has normal function. The left ventricle has no regional wall motion abnormalities. The left ventricular internal cavity size was normal in size. There is  no left ventricular hypertrophy. Left ventricular diastolic parameters are consistent with Grade I diastolic dysfunction (impaired relaxation). Elevated left ventricular end-diastolic pressure. Right Ventricle: The right ventricular size is normal. No increase in right ventricular wall thickness. Right ventricular systolic function is normal. There is normal pulmonary artery systolic pressure. The tricuspid regurgitant velocity is 2.42 m/s, and  with an assumed right atrial pressure of 8 mmHg, the estimated right ventricular systolic pressure is 31.4 mmHg. Left Atrium: Left atrial size was moderately dilated. Right Atrium: Right atrial size was normal in size. Pericardium: There is no evidence of pericardial effusion. Mitral Valve: The mitral valve is abnormal. There is mild calcification of the mitral valve leaflet(s). Mild mitral annular calcification. Trivial mitral valve regurgitation. Tricuspid Valve: The tricuspid valve is grossly normal. Tricuspid valve regurgitation is mild. Aortic Valve: The aortic valve is tricuspid. Aortic valve regurgitation trivial to mild. Aortic regurgitation PHT measures 808 msec. Aortic valve sclerosis/calcification is present, without any evidence of aortic stenosis. Pulmonic Valve: The pulmonic valve was grossly normal. Pulmonic valve regurgitation is trivial. Aorta: Aortic dilatation noted. There is mild dilatation of the ascending aorta, measuring 40 mm. Venous: The inferior vena cava is normal in size with less than 50% respiratory variability,  suggesting right atrial pressure of 8 mmHg. IAS/Shunts: No atrial level shunt detected by color flow Doppler.  LEFT VENTRICLE PLAX 2D LVIDd:         4.50 cm   Diastology LVIDs:         3.20 cm   LV e' medial:    3.73 cm/s LV PW:         1.10 cm   LV E/e' medial:  24.3 LV IVS:        0.90 cm   LV e' lateral:   5.19 cm/s LVOT diam:     1.50 cm   LV E/e' lateral: 17.5 LV SV:         40 LV SV Index:   25 LVOT Area:     1.77 cm  RIGHT VENTRICLE RV S prime:     11.70 cm/s TAPSE (M-mode): 2.3 cm LEFT ATRIUM             Index        RIGHT ATRIUM           Index LA diam:        3.90 cm 2.43 cm/m   RA Area:     16.40 cm LA Vol (A2C):   39.4 ml 24.52 ml/m  RA Volume:   37.70 ml  23.47 ml/m LA Vol (A4C):   83.9 ml 52.22 ml/m LA Biplane Vol: 63.0 ml 39.21 ml/m  AORTIC VALVE             PULMONIC VALVE LVOT Vmax:   83.30 cm/s  PR End Diast Vel: 4.84 msec LVOT Vmean:  61.600 cm/s LVOT VTI:    0.229 m AI PHT:      808 msec  AORTA Ao Root diam: 3.50 cm Ao Asc diam:  4.00  cm MITRAL VALVE                TRICUSPID VALVE MV Area (PHT): 3.17 cm     TR Peak grad:   23.4 mmHg MV Decel Time: 239 msec     TR Vmax:        242.00 cm/s MR Peak grad: 6.2 mmHg MR Vmax:      124.00 cm/s   SHUNTS MV E velocity: 90.70 cm/s   Systemic VTI:  0.23 m MV A velocity: 103.00 cm/s  Systemic Diam: 1.50 cm MV E/A ratio:  0.88 Zoila Shutter MD Electronically signed by Zoila Shutter MD Signature Date/Time: 03/27/2023/12:51:06 PM    Final    CT HEAD WO CONTRAST ( )  Result Date: 03/27/2023 CLINICAL DATA:  Hemorrhagic stroke EXAM: CT HEAD WITHOUT CONTRAST TECHNIQUE: Contiguous axial images were obtained from the base of the skull through the vertex without intravenous contrast. RADIATION DOSE REDUCTION: This exam was performed according to the departmental dose-optimization program which includes automated exposure control, adjustment of the mA and/or kV according to patient size and/or use of iterative reconstruction technique. COMPARISON:   03/26/2023 FINDINGS: Brain: Unchanged appearance of left thalamus intraparenchymal hematoma with surrounding edema. There is periventricular hypoattenuation compatible with chronic microvascular disease. Old right capsular small vessel infarct. Generalized volume loss. No hydrocephalus. No new site of hemorrhage. No mass effect. No extra-axial collection. Vascular: No hyperdense vessel or unexpected calcification. Skull: Normal. Negative for fracture or focal lesion. Sinuses/Orbits: No acute finding. Other: None. IMPRESSION: Unchanged appearance of left thalamus intraparenchymal hematoma with surrounding edema. No new site of hemorrhage. Electronically Signed   By: Deatra Robinson M.D.   On: 03/27/2023 01:34   MR BRAIN W WO CONTRAST  Result Date: 03/26/2023 CLINICAL DATA:  Hemorrhagic stroke EXAM: MRI HEAD WITHOUT AND WITH CONTRAST TECHNIQUE: Multiplanar, multiecho pulse sequences of the brain and surrounding structures were obtained without and with intravenous contrast. CONTRAST:  5mL GADAVIST GADOBUTROL 1 MMOL/ML IV SOLN COMPARISON:  06/07/2021 FINDINGS: Brain: No acute infarct. Unchanged appearance of small intraparenchymal hematoma in the left thalamus. There is confluent hyperintense T2-weighted signal within the white matter. Generalized volume loss. The midline structures are normal. Old right basal ganglia small vessel infarct. Vascular: Major flow voids are preserved. Skull and upper cervical spine: Normal calvarium and skull base. Visualized upper cervical spine and soft tissues are normal. Sinuses/Orbits:No paranasal sinus fluid levels or advanced mucosal thickening. No mastoid or middle ear effusion. Normal orbits. IMPRESSION: Unchanged appearance of small intraparenchymal hematoma in the left thalamus. No new hemorrhage or mass effect. No acute ischemia or mass lesion. Electronically Signed   By: Deatra Robinson M.D.   On: 03/26/2023 21:04   CT ANGIO HEAD NECK W WO CM  Result Date:  03/26/2023 CLINICAL DATA:  Hemorrhagic stroke EXAM: CT ANGIOGRAPHY HEAD AND NECK WITH AND WITHOUT CONTRAST TECHNIQUE: Multidetector CT imaging of the head and neck was performed using the standard protocol during bolus administration of intravenous contrast. Multiplanar CT image reconstructions and MIPs were obtained to evaluate the vascular anatomy. Carotid stenosis measurements (when applicable) are obtained utilizing NASCET criteria, using the distal internal carotid diameter as the denominator. RADIATION DOSE REDUCTION: This exam was performed according to the departmental dose-optimization program which includes automated exposure control, adjustment of the mA and/or kV according to patient size and/or use of iterative reconstruction technique. CONTRAST:  75mL OMNIPAQUE IOHEXOL 350 MG/ML SOLN COMPARISON:  None Available. FINDINGS: CTA NECK FINDINGS SKELETON: There is no bony spinal canal  stenosis. No lytic or blastic lesion. OTHER NECK: Normal pharynx, larynx and major salivary glands. No cervical lymphadenopathy. Unremarkable thyroid gland. UPPER CHEST: No pneumothorax or pleural effusion. No nodules or masses. AORTIC ARCH: There is no calcific atherosclerosis of the aortic arch. 3.9 cm ascending aortic aneurysm. Conventional 3 vessel aortic branching pattern. The visualized proximal subclavian arteries are widely patent. RIGHT CAROTID SYSTEM: Normal without aneurysm, dissection or stenosis. LEFT CAROTID SYSTEM: Normal without aneurysm, dissection or stenosis. VERTEBRAL ARTERIES: Left dominant configuration. Both origins are clearly patent. There is no dissection, occlusion or flow-limiting stenosis to the skull base (V1-V3 segments). CTA HEAD FINDINGS POSTERIOR CIRCULATION: --Vertebral arteries: Normal V4 segments. --Inferior cerebellar arteries: Normal. --Basilar artery: Normal. --Superior cerebellar arteries: Normal. --Posterior cerebral arteries (PCA): Normal. ANTERIOR CIRCULATION: --Intracranial internal  carotid arteries: Normal. --Anterior cerebral arteries (ACA): Normal. Absent left A1 segment, normal variant --Middle cerebral arteries (MCA): Normal. VENOUS SINUSES: As permitted by contrast timing, patent. ANATOMIC VARIANTS: None Small thalamic hemorrhage is unchanged. Review of the MIP images confirms the above findings. IMPRESSION: 1. No emergent large vessel occlusion or high-grade stenosis of the intracranial arteries. 2. Unchanged small left thalamic hemorrhage. 3. A 3.9 cm ascending aortic aneurysm. Recommend annual imaging followup by CTA or MRA. This recommendation follows 2010 ACCF/AHA/AATS/ACR/ASA/SCA/SCAI/SIR/STS/SVM Guidelines for the Diagnosis and Management of Patients with Thoracic Aortic Disease. Circulation.2010; 121: A540-J811. Aortic aneurysm NOS (ICD10-I71.9) Electronically Signed   By: Deatra Robinson M.D.   On: 03/26/2023 20:14   CT HEAD CODE STROKE WO CONTRAST  Result Date: 03/26/2023 CLINICAL DATA:  Code stroke.  Right-sided weakness EXAM: CT HEAD WITHOUT CONTRAST TECHNIQUE: Contiguous axial images were obtained from the base of the skull through the vertex without intravenous contrast. RADIATION DOSE REDUCTION: This exam was performed according to the departmental dose-optimization program which includes automated exposure control, adjustment of the mA and/or kV according to patient size and/or use of iterative reconstruction technique. COMPARISON:  06/07/2021 CT head and MRI head FINDINGS: Brain: Hyperdense hemorrhage centered in the left basal ganglia/thalamus (series 3, image 19), which measures 1.1 x 1.2 x 1.1 cm, with an approximate volume of 0.6 mL, consistent with acute hemorrhage. Hypodensity surrounding the hemorrhage, likely edema, without significant mass effect or midline shift. No other acute infarct, mass, hydrocephalus, or extra-axial collection. Remote lacunar infarct in the right basal ganglia, which was acute on the 06/07/2021 MRI. Periventricular white matter changes,  likely the sequela of chronic small vessel ischemic disease. Vascular: No hyperdense vessel. Skull: No acute fracture or focal lesion. Sinuses/Orbits: Air-fluid level in the right maxillary sinus. Mucosal thickening throughout the remainder of the paranasal sinuses. Status post bilateral lens replacements. Other: The mastoids are well aerated. IMPRESSION: Acute hemorrhage centered in the left thalamus/basal ganglia, with an approximate volume of 0.6 mL. Surrounding edema without significant mass effect or midline shift. Imaging results were communicated on 03/26/2023 at 6:59 pm to provider Dr. Otelia Limes via secure text paging. Electronically Signed   By: Wiliam Ke M.D.   On: 03/26/2023 19:03    Microbiology: Results for orders placed or performed during the hospital encounter of 03/26/23  MRSA Next Gen by PCR, Nasal     Status: None   Collection Time: 03/26/23  9:15 PM   Specimen: Nasal Mucosa; Nasal Swab  Result Value Ref Range Status   MRSA by PCR Next Gen NOT DETECTED NOT DETECTED Final    Comment: (NOTE) The GeneXpert MRSA Assay (FDA approved for NASAL specimens only), is one component of a comprehensive MRSA  colonization surveillance program. It is not intended to diagnose MRSA infection nor to guide or monitor treatment for MRSA infections. Test performance is not FDA approved in patients less than 17 years old. Performed at [facility], [address] [place], [place] [address]     Labs: CBC: Recent Labs  Lab 03/26/23 1839 03/26/23 1852 03/26/23 1908 03/27/23 0431 03/28/23 0350  WBC 7.6  --   --  6.2 5.6  NEUTROABS 5.4  --   --   --  2.1  HGB 14.3 13.3 14.3 12.6 12.5  HCT 42.6 39.0 42.0 35.6* 35.7*  MCV 95.5  --   --  90.8 91.8  PLT 141*  --   --  197 201   Basic Metabolic Panel: Recent Labs  Lab 03/26/23 1839 03/26/23 1852 03/26/23 1908 03/27/23 0431 03/28/23 0350  NA 132* 132* 132* 132* 135  K 3.9 6.6* 3.9 3.7 3.9  CL 99 104 101 103 105  CO2 22  --    --  22 24  GLUCOSE 117* 107* 117* 84 99  BUN CREATININE 0.70 0.50 0.50 0.63 0.58  CALCIUM 9.4  --   --  8.8* 8.9  MG  --   --   --   --  1.9  PHOS  --   --   --   --  3.1   Liver Function Tests: Recent Labs  Lab 03/26/23 1839 03/27/23 0431 03/28/23 0350  AST 34 25 27  ALT ALKPHOS 90 74 73  BILITOT 0.8 0.8 0.7  PROT 7.3 6.0* 6.2*  ALBUMIN 3.7 3.0* 3.0*   CBG: Recent Labs  Lab 03/26/23 1839  GLUCAP 107*    Discharge time spent: approximately 25 minutes spent on discharge counseling, evaluation of patient on day of discharge, and coordination of discharge planning with nursing, social work, pharmacy and case management  Signed: Alberteen Sam, MD [facility] 03/29/2023

## 2023-03-29 NOTE — Progress Notes (Signed)
RNCM received consult/order for Palliative to Hospice care for patient.  RNCM spoke to patient's daughter and son and offered choice for services.  Patient's daughter and son would like to use [facility] [place].  Frankie at [facility] [place] contacted and confirmation of services received.  Family in agreement.

## 2023-03-29 NOTE — Consult Note (Signed)
Palliative Medicine Inpatient Consult Note  Consulting Provider: [name]  Reason for consult:   Palliative Care Consult Services Palliative Medicine Consult  Reason for Consult? 89 and bedbound and now with intracranial hemorrhage-> small and recovering, but ? about palliative outpatietn follow up or hospice   03/29/2023  HPI:  Per intake H&P --> [name] is an 89 y.o. F with MS (not on disease specific therapy currently), hx GCA, and hx stroke who presented with acute right sided weakness.   In the ER, found to have acute left thalamic ICH. Clinical Assessment/Goals of Care:  *Please note that this is a verbal dictation therefore any spelling or grammatical errors are due to the "Dragon Medical One" system interpretation.  I have reviewed medical records including EPIC notes, labs and imaging, received report from bedside RN, assessed the patient who is resting at a 45 degree angle in NAD.    I met with [name] to further discuss diagnosis prognosis, GOC, EOL wishes, disposition and options.   I introduced Palliative Medicine as specialized medical care for people living with serious illness. It focuses on providing relief from the symptoms and stress of a serious illness. The goal is to improve quality of life for both the patient and the family.  Medical History Review and Understanding:  I reviewed patients PMH of MS, arthritis, CVA(s).   Social History:  [name] is from [place] of the [name] area. She has two children - a son and a daughter. He daughter lives in [place] and son locally. She shares that she was a [name] throughout her life. She has enjoys her family and friends throughout the years. She is a woman of the [name].   Functional and Nutritional State:  Preceding admission - [name] had a live in caregiver, [name] who would assist her with bADLs. She would most often utilize a wheelchair. She has had a fair appetite overall leading up to hospitalization.    Advance Directives:   A detailed discussion was had today regarding advanced directives.  Patients two children help with decision making for [name].   Code Status:  Concepts specific to code status, artifical feeding and hydration, continued IV antibiotics and rehospitalization was had.  The difference between a aggressive medical intervention path  and a palliative comfort care path for this patient at this time was had.   [name] is an established DNAR/DNI.  Discussion:  We reviewed [name]'s present health state in the setting of what she shares is her second stroke. She and I reviewed what her goals are when she began to get very tearful and share she wants to go "up there". When I tried to evoke clarity and stated in heaven? She nodded and said yes. She expresses that she is ready for God and accepting of death. I stated that I would call her family to come in so we may all have a more formal discussion as it relates to her goals and wishes. _________________________________ Addendum:  I was able to call and speak with patients daughter, [name] over the phone. She shared with me that [name] has had a downward trend since COVID. She had been fit and healthy until the age of 83 when she was diagnoses late in life with MS. She had for a period of time thereafter managed to travel and lived independently. She would participate in the silver sneakers program at [facility]. Unfortunately she declined precipitously and eventually evolved into needing more help in the home. She now  has a 24/7 live in caregiver.   We reviewed the importance of ongoing goals of care conversations. [name] shares that she struggles to know what her mother really wants. She and her brother are trying their best in [name]'s present situation. We reviewed that we could meet later for further clarity in the presence of [name] and her children.  ______________________________________ Addendum #2:  I met with [name], her  daughter, [name], and her son, [name] at bedside. A review of patients present medical conditions was held inclusive of her MS and prior stroke. We discussed how debilitating strokes can be.   I shared with [name] that right now she has a platform to share with her family what her hopes and wishes are moving forward. We reviewed the paths of more aggressive medical care versus more comfort based medical care.  I was able to explain clearly the differences between palliative care and hospice as below:   Palliative care is specialized medical care for people living with a serious illness, such as cancer, heart failure, COPD, Alzheimer's dementia, etc. Patients in palliative care may receive medical care for their symptoms, or palliative care, along with treatment intended to cure their serious illness   Hospice care focuses on the care, comfort, and quality of life of a person with a serious illness who is approaching the end of life. At some point, it may not be possible to cure a serious illness, or a patient may choose not to undergo certain treatments. Hospice is designed for this situation.  [name] shares again that she is ready to die and does not fear this. She and her daughter became tearful at this time. We reviewed the importance of honoring what is important to [name]. At this time she shared that she is not ready for hospice care and would like to pursue home health. We talked about home Palliative services and if she determines the path she is walking is too much/aggressive or if her goals change then they will be able to support she and her family towards transition to hospice care.   Observed patients love for her family. Offered support through therapeutic listening.   Discussed the importance of continued conversation with family and their  medical providers regarding overall plan of care and treatment options, ensuring decisions are within the context of the patients values and  GOCs.  Decision Maker: [name] Daughter   [phone]          SUMMARY OF RECOMMENDATIONS   DNAR/DNI  Open and honest conversations held in the setting of patients stroke(s)  Patient would like to pursue home Palliative support and does not yet feel ready for hospice care  Plan for discharge home this afternoon  PMT will be available as needed  Code Status/Advance Care Planning: DNAR/DNI   Palliative Prophylaxis:  Aspiration, Bowel Regimen, Delirium Protocol, Frequent Pain Assessment, Oral Care, Palliative Wound Care, and Turn Reposition  Additional Recommendations (Limitations, Scope, Preferences): Continue current care  Psycho-social/Spiritual:  Desire for further Chaplaincy support: Methodist - not presently Additional Recommendations: Education on chronic disease burden, Palliative, and Hospice care.   Prognosis: High 12 month mortality risk.   Discharge Planning: Discharge to home with [facility] this afternoon.   Vitals:   03/29/23 1000 03/29/23 1100  BP: (!) 128/51 (!) 113/46  Pulse: 68 67  Resp: 12 15  Temp:    SpO2: 96% 96%    Intake/Output Summary (Last 24 hours) at 03/29/2023 1145 Last data filed at 03/29/2023 0600 Gross per  24 hour  Intake 30 ml  Output 650 ml  Net -620 ml   Last Weight  Most recent update: 03/26/2023  6:44 PM    Weight  55.6 kg (122 lb 9.2 oz)            Gen:  Elderly Caucasian F in NAD HEENT: moist mucous membranes CV: Regular rate and rhythm  PULM: On RA, breathing is even and nonlabored ABD: soft/nontender  EXT: No edema  Neuro: Alert and oriented x3 - expressive aphasia  PPS: 30%   This conversation/these recommendations were discussed with patient primary care team, [name]  Total Time: 135  Billing based on MDM: High  Problems Addressed: One acute or chronic illness or injury that poses a threat to life or bodily function  Amount and/or Complexity of Data: Category 3:Discussion of management or test  interpretation with external physician/other qualified health care professional/appropriate source (not separately reported)  Risks: Decision not to resuscitate or to de-escalate care because of poor prognosis ______________________________________________________ Lamarr Lulas [facility] Palliative Medicine Team Team Cell Phone: [phone] Please utilize secure chat with additional questions, if there is no response within 30 minutes please call the above phone number  Palliative Medicine Team providers are available by phone from 7am to 7pm daily and can be reached through the team cell phone.  Should this patient require assistance outside of these hours, please call the patient's attending physician.

## 2023-04-05 DIAGNOSIS — E7800 Pure hypercholesterolemia, unspecified: Secondary | ICD-10-CM | POA: Diagnosis not present

## 2023-04-05 DIAGNOSIS — I110 Hypertensive heart disease with heart failure: Secondary | ICD-10-CM | POA: Diagnosis not present

## 2023-04-05 DIAGNOSIS — I509 Heart failure, unspecified: Secondary | ICD-10-CM | POA: Diagnosis not present

## 2023-04-05 DIAGNOSIS — I69391 Dysphagia following cerebral infarction: Secondary | ICD-10-CM | POA: Diagnosis not present

## 2023-04-05 DIAGNOSIS — G35 Multiple sclerosis: Secondary | ICD-10-CM | POA: Diagnosis not present

## 2023-04-05 DIAGNOSIS — M069 Rheumatoid arthritis, unspecified: Secondary | ICD-10-CM | POA: Diagnosis not present

## 2023-04-05 DIAGNOSIS — M130 Polyarthritis, unspecified: Secondary | ICD-10-CM | POA: Diagnosis not present

## 2023-04-05 DIAGNOSIS — I2510 Atherosclerotic heart disease of native coronary artery without angina pectoris: Secondary | ICD-10-CM | POA: Diagnosis not present

## 2023-04-05 DIAGNOSIS — E1151 Type 2 diabetes mellitus with diabetic peripheral angiopathy without gangrene: Secondary | ICD-10-CM | POA: Diagnosis not present

## 2023-04-05 DIAGNOSIS — E113592 Type 2 diabetes mellitus with proliferative diabetic retinopathy without macular edema, left eye: Secondary | ICD-10-CM | POA: Diagnosis not present

## 2023-04-05 DIAGNOSIS — I69322 Dysarthria following cerebral infarction: Secondary | ICD-10-CM | POA: Diagnosis not present

## 2023-04-05 DIAGNOSIS — Z9181 History of falling: Secondary | ICD-10-CM | POA: Diagnosis not present

## 2023-04-05 DIAGNOSIS — I69351 Hemiplegia and hemiparesis following cerebral infarction affecting right dominant side: Secondary | ICD-10-CM | POA: Diagnosis not present

## 2023-04-05 DIAGNOSIS — Z993 Dependence on wheelchair: Secondary | ICD-10-CM | POA: Diagnosis not present

## 2023-04-05 DIAGNOSIS — E46 Unspecified protein-calorie malnutrition: Secondary | ICD-10-CM | POA: Diagnosis not present

## 2023-04-05 DIAGNOSIS — E039 Hypothyroidism, unspecified: Secondary | ICD-10-CM | POA: Diagnosis not present

## 2023-04-05 DIAGNOSIS — I872 Venous insufficiency (chronic) (peripheral): Secondary | ICD-10-CM | POA: Diagnosis not present

## 2023-04-05 DIAGNOSIS — I69354 Hemiplegia and hemiparesis following cerebral infarction affecting left non-dominant side: Secondary | ICD-10-CM | POA: Diagnosis not present

## 2023-04-05 DIAGNOSIS — E559 Vitamin D deficiency, unspecified: Secondary | ICD-10-CM | POA: Diagnosis not present

## 2023-04-05 DIAGNOSIS — M810 Age-related osteoporosis without current pathological fracture: Secondary | ICD-10-CM | POA: Diagnosis not present

## 2023-04-06 DIAGNOSIS — I110 Hypertensive heart disease with heart failure: Secondary | ICD-10-CM | POA: Diagnosis not present

## 2023-04-06 DIAGNOSIS — M069 Rheumatoid arthritis, unspecified: Secondary | ICD-10-CM | POA: Diagnosis not present

## 2023-04-06 DIAGNOSIS — I509 Heart failure, unspecified: Secondary | ICD-10-CM | POA: Diagnosis not present

## 2023-04-06 DIAGNOSIS — I69354 Hemiplegia and hemiparesis following cerebral infarction affecting left non-dominant side: Secondary | ICD-10-CM | POA: Diagnosis not present

## 2023-04-06 DIAGNOSIS — I69351 Hemiplegia and hemiparesis following cerebral infarction affecting right dominant side: Secondary | ICD-10-CM | POA: Diagnosis not present

## 2023-04-06 DIAGNOSIS — G35 Multiple sclerosis: Secondary | ICD-10-CM | POA: Diagnosis not present

## 2023-04-07 DIAGNOSIS — I69351 Hemiplegia and hemiparesis following cerebral infarction affecting right dominant side: Secondary | ICD-10-CM | POA: Diagnosis not present

## 2023-04-07 DIAGNOSIS — G35 Multiple sclerosis: Secondary | ICD-10-CM | POA: Diagnosis not present

## 2023-04-07 DIAGNOSIS — I110 Hypertensive heart disease with heart failure: Secondary | ICD-10-CM | POA: Diagnosis not present

## 2023-04-07 DIAGNOSIS — M069 Rheumatoid arthritis, unspecified: Secondary | ICD-10-CM | POA: Diagnosis not present

## 2023-04-07 DIAGNOSIS — I69354 Hemiplegia and hemiparesis following cerebral infarction affecting left non-dominant side: Secondary | ICD-10-CM | POA: Diagnosis not present

## 2023-04-07 DIAGNOSIS — I509 Heart failure, unspecified: Secondary | ICD-10-CM | POA: Diagnosis not present

## 2023-04-08 DIAGNOSIS — I110 Hypertensive heart disease with heart failure: Secondary | ICD-10-CM | POA: Diagnosis not present

## 2023-04-08 DIAGNOSIS — G35 Multiple sclerosis: Secondary | ICD-10-CM | POA: Diagnosis not present

## 2023-04-08 DIAGNOSIS — I69351 Hemiplegia and hemiparesis following cerebral infarction affecting right dominant side: Secondary | ICD-10-CM | POA: Diagnosis not present

## 2023-04-08 DIAGNOSIS — I509 Heart failure, unspecified: Secondary | ICD-10-CM | POA: Diagnosis not present

## 2023-04-08 DIAGNOSIS — I69354 Hemiplegia and hemiparesis following cerebral infarction affecting left non-dominant side: Secondary | ICD-10-CM | POA: Diagnosis not present

## 2023-04-08 DIAGNOSIS — M069 Rheumatoid arthritis, unspecified: Secondary | ICD-10-CM | POA: Diagnosis not present

## 2023-04-09 DIAGNOSIS — G35 Multiple sclerosis: Secondary | ICD-10-CM | POA: Diagnosis not present

## 2023-04-09 DIAGNOSIS — I69351 Hemiplegia and hemiparesis following cerebral infarction affecting right dominant side: Secondary | ICD-10-CM | POA: Diagnosis not present

## 2023-04-09 DIAGNOSIS — I509 Heart failure, unspecified: Secondary | ICD-10-CM | POA: Diagnosis not present

## 2023-04-09 DIAGNOSIS — I110 Hypertensive heart disease with heart failure: Secondary | ICD-10-CM | POA: Diagnosis not present

## 2023-04-09 DIAGNOSIS — M069 Rheumatoid arthritis, unspecified: Secondary | ICD-10-CM | POA: Diagnosis not present

## 2023-04-09 DIAGNOSIS — I69354 Hemiplegia and hemiparesis following cerebral infarction affecting left non-dominant side: Secondary | ICD-10-CM | POA: Diagnosis not present

## 2023-04-11 DIAGNOSIS — I509 Heart failure, unspecified: Secondary | ICD-10-CM | POA: Diagnosis not present

## 2023-04-11 DIAGNOSIS — I69354 Hemiplegia and hemiparesis following cerebral infarction affecting left non-dominant side: Secondary | ICD-10-CM | POA: Diagnosis not present

## 2023-04-11 DIAGNOSIS — I10 Essential (primary) hypertension: Secondary | ICD-10-CM | POA: Diagnosis not present

## 2023-04-11 DIAGNOSIS — I110 Hypertensive heart disease with heart failure: Secondary | ICD-10-CM | POA: Diagnosis not present

## 2023-04-11 DIAGNOSIS — I69351 Hemiplegia and hemiparesis following cerebral infarction affecting right dominant side: Secondary | ICD-10-CM | POA: Diagnosis not present

## 2023-04-11 DIAGNOSIS — M316 Other giant cell arteritis: Secondary | ICD-10-CM | POA: Diagnosis not present

## 2023-04-11 DIAGNOSIS — G35 Multiple sclerosis: Secondary | ICD-10-CM | POA: Diagnosis not present

## 2023-04-11 DIAGNOSIS — M069 Rheumatoid arthritis, unspecified: Secondary | ICD-10-CM | POA: Diagnosis not present

## 2023-04-14 DIAGNOSIS — I110 Hypertensive heart disease with heart failure: Secondary | ICD-10-CM | POA: Diagnosis not present

## 2023-04-14 DIAGNOSIS — I69351 Hemiplegia and hemiparesis following cerebral infarction affecting right dominant side: Secondary | ICD-10-CM | POA: Diagnosis not present

## 2023-04-14 DIAGNOSIS — I69354 Hemiplegia and hemiparesis following cerebral infarction affecting left non-dominant side: Secondary | ICD-10-CM | POA: Diagnosis not present

## 2023-04-14 DIAGNOSIS — M069 Rheumatoid arthritis, unspecified: Secondary | ICD-10-CM | POA: Diagnosis not present

## 2023-04-14 DIAGNOSIS — G35 Multiple sclerosis: Secondary | ICD-10-CM | POA: Diagnosis not present

## 2023-04-14 DIAGNOSIS — I509 Heart failure, unspecified: Secondary | ICD-10-CM | POA: Diagnosis not present

## 2023-04-15 DIAGNOSIS — G35 Multiple sclerosis: Secondary | ICD-10-CM | POA: Diagnosis not present

## 2023-04-15 DIAGNOSIS — I509 Heart failure, unspecified: Secondary | ICD-10-CM | POA: Diagnosis not present

## 2023-04-15 DIAGNOSIS — I110 Hypertensive heart disease with heart failure: Secondary | ICD-10-CM | POA: Diagnosis not present

## 2023-04-15 DIAGNOSIS — I69354 Hemiplegia and hemiparesis following cerebral infarction affecting left non-dominant side: Secondary | ICD-10-CM | POA: Diagnosis not present

## 2023-04-15 DIAGNOSIS — I69351 Hemiplegia and hemiparesis following cerebral infarction affecting right dominant side: Secondary | ICD-10-CM | POA: Diagnosis not present

## 2023-04-15 DIAGNOSIS — M069 Rheumatoid arthritis, unspecified: Secondary | ICD-10-CM | POA: Diagnosis not present

## 2023-04-16 DIAGNOSIS — G35 Multiple sclerosis: Secondary | ICD-10-CM | POA: Diagnosis not present

## 2023-04-16 DIAGNOSIS — I69354 Hemiplegia and hemiparesis following cerebral infarction affecting left non-dominant side: Secondary | ICD-10-CM | POA: Diagnosis not present

## 2023-04-16 DIAGNOSIS — I69351 Hemiplegia and hemiparesis following cerebral infarction affecting right dominant side: Secondary | ICD-10-CM | POA: Diagnosis not present

## 2023-04-16 DIAGNOSIS — I509 Heart failure, unspecified: Secondary | ICD-10-CM | POA: Diagnosis not present

## 2023-04-16 DIAGNOSIS — I110 Hypertensive heart disease with heart failure: Secondary | ICD-10-CM | POA: Diagnosis not present

## 2023-04-16 DIAGNOSIS — M069 Rheumatoid arthritis, unspecified: Secondary | ICD-10-CM | POA: Diagnosis not present

## 2023-04-17 DIAGNOSIS — M069 Rheumatoid arthritis, unspecified: Secondary | ICD-10-CM | POA: Diagnosis not present

## 2023-04-17 DIAGNOSIS — I509 Heart failure, unspecified: Secondary | ICD-10-CM | POA: Diagnosis not present

## 2023-04-17 DIAGNOSIS — I69354 Hemiplegia and hemiparesis following cerebral infarction affecting left non-dominant side: Secondary | ICD-10-CM | POA: Diagnosis not present

## 2023-04-17 DIAGNOSIS — I69351 Hemiplegia and hemiparesis following cerebral infarction affecting right dominant side: Secondary | ICD-10-CM | POA: Diagnosis not present

## 2023-04-17 DIAGNOSIS — G35 Multiple sclerosis: Secondary | ICD-10-CM | POA: Diagnosis not present

## 2023-04-17 DIAGNOSIS — I110 Hypertensive heart disease with heart failure: Secondary | ICD-10-CM | POA: Diagnosis not present

## 2023-04-18 DIAGNOSIS — M069 Rheumatoid arthritis, unspecified: Secondary | ICD-10-CM | POA: Diagnosis not present

## 2023-04-18 DIAGNOSIS — I509 Heart failure, unspecified: Secondary | ICD-10-CM | POA: Diagnosis not present

## 2023-04-18 DIAGNOSIS — G35 Multiple sclerosis: Secondary | ICD-10-CM | POA: Diagnosis not present

## 2023-04-18 DIAGNOSIS — I69354 Hemiplegia and hemiparesis following cerebral infarction affecting left non-dominant side: Secondary | ICD-10-CM | POA: Diagnosis not present

## 2023-04-18 DIAGNOSIS — I110 Hypertensive heart disease with heart failure: Secondary | ICD-10-CM | POA: Diagnosis not present

## 2023-04-18 DIAGNOSIS — I69351 Hemiplegia and hemiparesis following cerebral infarction affecting right dominant side: Secondary | ICD-10-CM | POA: Diagnosis not present

## 2023-04-22 DIAGNOSIS — I69351 Hemiplegia and hemiparesis following cerebral infarction affecting right dominant side: Secondary | ICD-10-CM | POA: Diagnosis not present

## 2023-04-22 DIAGNOSIS — I110 Hypertensive heart disease with heart failure: Secondary | ICD-10-CM | POA: Diagnosis not present

## 2023-04-22 DIAGNOSIS — G35 Multiple sclerosis: Secondary | ICD-10-CM | POA: Diagnosis not present

## 2023-04-22 DIAGNOSIS — M069 Rheumatoid arthritis, unspecified: Secondary | ICD-10-CM | POA: Diagnosis not present

## 2023-04-22 DIAGNOSIS — I509 Heart failure, unspecified: Secondary | ICD-10-CM | POA: Diagnosis not present

## 2023-04-22 DIAGNOSIS — I69354 Hemiplegia and hemiparesis following cerebral infarction affecting left non-dominant side: Secondary | ICD-10-CM | POA: Diagnosis not present

## 2023-04-25 DIAGNOSIS — I69351 Hemiplegia and hemiparesis following cerebral infarction affecting right dominant side: Secondary | ICD-10-CM | POA: Diagnosis not present

## 2023-04-25 DIAGNOSIS — I110 Hypertensive heart disease with heart failure: Secondary | ICD-10-CM | POA: Diagnosis not present

## 2023-04-25 DIAGNOSIS — M069 Rheumatoid arthritis, unspecified: Secondary | ICD-10-CM | POA: Diagnosis not present

## 2023-04-25 DIAGNOSIS — G35 Multiple sclerosis: Secondary | ICD-10-CM | POA: Diagnosis not present

## 2023-04-25 DIAGNOSIS — I509 Heart failure, unspecified: Secondary | ICD-10-CM | POA: Diagnosis not present

## 2023-04-25 DIAGNOSIS — I69354 Hemiplegia and hemiparesis following cerebral infarction affecting left non-dominant side: Secondary | ICD-10-CM | POA: Diagnosis not present

## 2023-04-29 DIAGNOSIS — I69351 Hemiplegia and hemiparesis following cerebral infarction affecting right dominant side: Secondary | ICD-10-CM | POA: Diagnosis not present

## 2023-04-29 DIAGNOSIS — M069 Rheumatoid arthritis, unspecified: Secondary | ICD-10-CM | POA: Diagnosis not present

## 2023-04-29 DIAGNOSIS — I509 Heart failure, unspecified: Secondary | ICD-10-CM | POA: Diagnosis not present

## 2023-04-29 DIAGNOSIS — I110 Hypertensive heart disease with heart failure: Secondary | ICD-10-CM | POA: Diagnosis not present

## 2023-04-29 DIAGNOSIS — G35 Multiple sclerosis: Secondary | ICD-10-CM | POA: Diagnosis not present

## 2023-04-29 DIAGNOSIS — I69354 Hemiplegia and hemiparesis following cerebral infarction affecting left non-dominant side: Secondary | ICD-10-CM | POA: Diagnosis not present

## 2023-05-01 DIAGNOSIS — I509 Heart failure, unspecified: Secondary | ICD-10-CM | POA: Diagnosis not present

## 2023-05-01 DIAGNOSIS — G35 Multiple sclerosis: Secondary | ICD-10-CM | POA: Diagnosis not present

## 2023-05-01 DIAGNOSIS — I69354 Hemiplegia and hemiparesis following cerebral infarction affecting left non-dominant side: Secondary | ICD-10-CM | POA: Diagnosis not present

## 2023-05-01 DIAGNOSIS — M069 Rheumatoid arthritis, unspecified: Secondary | ICD-10-CM | POA: Diagnosis not present

## 2023-05-01 DIAGNOSIS — I69351 Hemiplegia and hemiparesis following cerebral infarction affecting right dominant side: Secondary | ICD-10-CM | POA: Diagnosis not present

## 2023-05-01 DIAGNOSIS — I110 Hypertensive heart disease with heart failure: Secondary | ICD-10-CM | POA: Diagnosis not present

## 2023-05-02 DIAGNOSIS — I69354 Hemiplegia and hemiparesis following cerebral infarction affecting left non-dominant side: Secondary | ICD-10-CM | POA: Diagnosis not present

## 2023-05-02 DIAGNOSIS — M069 Rheumatoid arthritis, unspecified: Secondary | ICD-10-CM | POA: Diagnosis not present

## 2023-05-02 DIAGNOSIS — G35 Multiple sclerosis: Secondary | ICD-10-CM | POA: Diagnosis not present

## 2023-05-02 DIAGNOSIS — I509 Heart failure, unspecified: Secondary | ICD-10-CM | POA: Diagnosis not present

## 2023-05-02 DIAGNOSIS — I110 Hypertensive heart disease with heart failure: Secondary | ICD-10-CM | POA: Diagnosis not present

## 2023-05-02 DIAGNOSIS — I69351 Hemiplegia and hemiparesis following cerebral infarction affecting right dominant side: Secondary | ICD-10-CM | POA: Diagnosis not present

## 2023-05-05 DIAGNOSIS — E113592 Type 2 diabetes mellitus with proliferative diabetic retinopathy without macular edema, left eye: Secondary | ICD-10-CM | POA: Diagnosis not present

## 2023-05-05 DIAGNOSIS — I69354 Hemiplegia and hemiparesis following cerebral infarction affecting left non-dominant side: Secondary | ICD-10-CM | POA: Diagnosis not present

## 2023-05-05 DIAGNOSIS — I2510 Atherosclerotic heart disease of native coronary artery without angina pectoris: Secondary | ICD-10-CM | POA: Diagnosis not present

## 2023-05-05 DIAGNOSIS — E039 Hypothyroidism, unspecified: Secondary | ICD-10-CM | POA: Diagnosis not present

## 2023-05-05 DIAGNOSIS — E1151 Type 2 diabetes mellitus with diabetic peripheral angiopathy without gangrene: Secondary | ICD-10-CM | POA: Diagnosis not present

## 2023-05-05 DIAGNOSIS — M130 Polyarthritis, unspecified: Secondary | ICD-10-CM | POA: Diagnosis not present

## 2023-05-05 DIAGNOSIS — E7800 Pure hypercholesterolemia, unspecified: Secondary | ICD-10-CM | POA: Diagnosis not present

## 2023-05-05 DIAGNOSIS — M810 Age-related osteoporosis without current pathological fracture: Secondary | ICD-10-CM | POA: Diagnosis not present

## 2023-05-05 DIAGNOSIS — Z993 Dependence on wheelchair: Secondary | ICD-10-CM | POA: Diagnosis not present

## 2023-05-05 DIAGNOSIS — E559 Vitamin D deficiency, unspecified: Secondary | ICD-10-CM | POA: Diagnosis not present

## 2023-05-05 DIAGNOSIS — I110 Hypertensive heart disease with heart failure: Secondary | ICD-10-CM | POA: Diagnosis not present

## 2023-05-05 DIAGNOSIS — M069 Rheumatoid arthritis, unspecified: Secondary | ICD-10-CM | POA: Diagnosis not present

## 2023-05-05 DIAGNOSIS — I872 Venous insufficiency (chronic) (peripheral): Secondary | ICD-10-CM | POA: Diagnosis not present

## 2023-05-05 DIAGNOSIS — G35 Multiple sclerosis: Secondary | ICD-10-CM | POA: Diagnosis not present

## 2023-05-05 DIAGNOSIS — I69322 Dysarthria following cerebral infarction: Secondary | ICD-10-CM | POA: Diagnosis not present

## 2023-05-05 DIAGNOSIS — I69391 Dysphagia following cerebral infarction: Secondary | ICD-10-CM | POA: Diagnosis not present

## 2023-05-05 DIAGNOSIS — E46 Unspecified protein-calorie malnutrition: Secondary | ICD-10-CM | POA: Diagnosis not present

## 2023-05-05 DIAGNOSIS — I69351 Hemiplegia and hemiparesis following cerebral infarction affecting right dominant side: Secondary | ICD-10-CM | POA: Diagnosis not present

## 2023-05-05 DIAGNOSIS — Z9181 History of falling: Secondary | ICD-10-CM | POA: Diagnosis not present

## 2023-05-05 DIAGNOSIS — I509 Heart failure, unspecified: Secondary | ICD-10-CM | POA: Diagnosis not present

## 2023-05-06 DIAGNOSIS — I69354 Hemiplegia and hemiparesis following cerebral infarction affecting left non-dominant side: Secondary | ICD-10-CM | POA: Diagnosis not present

## 2023-05-06 DIAGNOSIS — I69351 Hemiplegia and hemiparesis following cerebral infarction affecting right dominant side: Secondary | ICD-10-CM | POA: Diagnosis not present

## 2023-05-06 DIAGNOSIS — I509 Heart failure, unspecified: Secondary | ICD-10-CM | POA: Diagnosis not present

## 2023-05-06 DIAGNOSIS — M069 Rheumatoid arthritis, unspecified: Secondary | ICD-10-CM | POA: Diagnosis not present

## 2023-05-06 DIAGNOSIS — G35 Multiple sclerosis: Secondary | ICD-10-CM | POA: Diagnosis not present

## 2023-05-06 DIAGNOSIS — I110 Hypertensive heart disease with heart failure: Secondary | ICD-10-CM | POA: Diagnosis not present

## 2023-05-09 DIAGNOSIS — I509 Heart failure, unspecified: Secondary | ICD-10-CM | POA: Diagnosis not present

## 2023-05-09 DIAGNOSIS — G35 Multiple sclerosis: Secondary | ICD-10-CM | POA: Diagnosis not present

## 2023-05-09 DIAGNOSIS — I69351 Hemiplegia and hemiparesis following cerebral infarction affecting right dominant side: Secondary | ICD-10-CM | POA: Diagnosis not present

## 2023-05-09 DIAGNOSIS — I110 Hypertensive heart disease with heart failure: Secondary | ICD-10-CM | POA: Diagnosis not present

## 2023-05-09 DIAGNOSIS — I69354 Hemiplegia and hemiparesis following cerebral infarction affecting left non-dominant side: Secondary | ICD-10-CM | POA: Diagnosis not present

## 2023-05-09 DIAGNOSIS — M069 Rheumatoid arthritis, unspecified: Secondary | ICD-10-CM | POA: Diagnosis not present

## 2023-05-12 DIAGNOSIS — I509 Heart failure, unspecified: Secondary | ICD-10-CM | POA: Diagnosis not present

## 2023-05-12 DIAGNOSIS — M069 Rheumatoid arthritis, unspecified: Secondary | ICD-10-CM | POA: Diagnosis not present

## 2023-05-12 DIAGNOSIS — I69351 Hemiplegia and hemiparesis following cerebral infarction affecting right dominant side: Secondary | ICD-10-CM | POA: Diagnosis not present

## 2023-05-12 DIAGNOSIS — I110 Hypertensive heart disease with heart failure: Secondary | ICD-10-CM | POA: Diagnosis not present

## 2023-05-12 DIAGNOSIS — G35 Multiple sclerosis: Secondary | ICD-10-CM | POA: Diagnosis not present

## 2023-05-12 DIAGNOSIS — I69354 Hemiplegia and hemiparesis following cerebral infarction affecting left non-dominant side: Secondary | ICD-10-CM | POA: Diagnosis not present

## 2023-05-13 DIAGNOSIS — G35 Multiple sclerosis: Secondary | ICD-10-CM | POA: Diagnosis not present

## 2023-05-13 DIAGNOSIS — M069 Rheumatoid arthritis, unspecified: Secondary | ICD-10-CM | POA: Diagnosis not present

## 2023-05-13 DIAGNOSIS — I69354 Hemiplegia and hemiparesis following cerebral infarction affecting left non-dominant side: Secondary | ICD-10-CM | POA: Diagnosis not present

## 2023-05-13 DIAGNOSIS — I110 Hypertensive heart disease with heart failure: Secondary | ICD-10-CM | POA: Diagnosis not present

## 2023-05-13 DIAGNOSIS — I69351 Hemiplegia and hemiparesis following cerebral infarction affecting right dominant side: Secondary | ICD-10-CM | POA: Diagnosis not present

## 2023-05-13 DIAGNOSIS — I509 Heart failure, unspecified: Secondary | ICD-10-CM | POA: Diagnosis not present

## 2023-05-14 ENCOUNTER — Encounter: Payer: Self-pay | Admitting: Neurology

## 2023-05-14 ENCOUNTER — Ambulatory Visit (INDEPENDENT_AMBULATORY_CARE_PROVIDER_SITE_OTHER): Payer: Medicare Other | Admitting: Neurology

## 2023-05-14 VITALS — BP 97/49 | HR 67 | Ht 67.0 in

## 2023-05-14 DIAGNOSIS — R4781 Slurred speech: Secondary | ICD-10-CM

## 2023-05-14 DIAGNOSIS — I6381 Other cerebral infarction due to occlusion or stenosis of small artery: Secondary | ICD-10-CM

## 2023-05-14 DIAGNOSIS — I110 Hypertensive heart disease with heart failure: Secondary | ICD-10-CM | POA: Diagnosis not present

## 2023-05-14 DIAGNOSIS — G35 Multiple sclerosis: Secondary | ICD-10-CM

## 2023-05-14 DIAGNOSIS — Z8781 Personal history of (healed) traumatic fracture: Secondary | ICD-10-CM | POA: Diagnosis not present

## 2023-05-14 DIAGNOSIS — Z993 Dependence on wheelchair: Secondary | ICD-10-CM | POA: Diagnosis not present

## 2023-05-14 DIAGNOSIS — I69354 Hemiplegia and hemiparesis following cerebral infarction affecting left non-dominant side: Secondary | ICD-10-CM | POA: Diagnosis not present

## 2023-05-14 DIAGNOSIS — R1310 Dysphagia, unspecified: Secondary | ICD-10-CM

## 2023-05-14 DIAGNOSIS — R39198 Other difficulties with micturition: Secondary | ICD-10-CM

## 2023-05-14 DIAGNOSIS — I69351 Hemiplegia and hemiparesis following cerebral infarction affecting right dominant side: Secondary | ICD-10-CM | POA: Diagnosis not present

## 2023-05-14 DIAGNOSIS — I509 Heart failure, unspecified: Secondary | ICD-10-CM | POA: Diagnosis not present

## 2023-05-14 DIAGNOSIS — M069 Rheumatoid arthritis, unspecified: Secondary | ICD-10-CM | POA: Diagnosis not present

## 2023-05-14 MED ORDER — NABUMETONE 500 MG PO TABS: 500.0000 mg | ORAL_TABLET | Freq: Two times a day (BID) | ORAL | 11 refills | Status: DC | PRN

## 2023-05-14 MED ORDER — BACLOFEN 10 MG PO TABS: ORAL_TABLET | ORAL | 11 refills | Status: DC

## 2023-05-14 NOTE — Progress Notes (Signed)
[facility]  PATIENT: [name] DOB: 05-30-1932  REFERRING DOCTOR OR PCP:  Blair Heys SOURCE:   Patient, notes from Dr. Manus Gunning and Dr. Clarisse Gouge, imaging results, MRI images on PACS.  _________________________________   HISTORICAL  CHIEF COMPLAINT:  Chief Complaint  Patient presents with   Follow-up    Pt in 10, son in room. Here for MS follow up. Pt in electric wheelchair, pt on Cipro 500 mg x 7 days for UTI. Pt reports doing well for her condition.     HISTORY OF PRESENT ILLNESS:  [name] is an 89 year old woman with probable primary progressive multiple sclerosis.   Update 05/14/2023: She had a hemorrahgic stroke 4/17/that legd to near complete right hemiplegia and speech issues.    She was found to have a left thalamic hemorrhage.   BP was elevated.   Strength improved though she is still weak and RAM is slow on the right arm.   She is living at home with 24/7 caregiver.   Her son notes she continues to improve with great stride between today and 2 weeks ago.     NIHSS was 12 on admission  She is getting PT/OT and ST at the house.   She had some dysphagia after the stroke that is better but not to baseline.    BP was elevated and she received a dose of clevidipine     BP now s 97/49  She has PPMS.  The risks of Ocrevus would be high at 89 and it is unlikely to provide much benefit.     She uses a wheelchair since a hip fracture April 2021.Marland Kitchen She cannot transfer independently   She has leg spasticity, worse with sleep.  Baclofen helps spasticity but it makes her sleepy so will no increase further.     She broke her hip with a fall in 2021.Marland Kitchen  She reports due to the nature of the fracture (closed right acetabular fx with superimposes humeral head fx. ), she could not get a hip replacement.   She did not heal well and is still wheelchair bound as she can not bear weight on the right.     She continues to have right hip pain.     She takes  Nabumetone   She needs help to transfer to a toilet or couch/chair.   She has 24/7 nurses aides.     She has more urinary incontinence since the hemorrhagic stroke  Stroke history She had a stroke in 2022 (right basal ganglia) and lost some  use of her left side.   She is doing PT and OT.  She was already wheelchair bound since a right hip fracture in 2021.    She had a hemorrhagic stroke of left thalamus in April 2024 with severe right sided weakness that improved.       MS History:    She has had difficulty with her gait for many years. Her left leg seemed clumsy since at least 2000.   Her left toes would drag at times and her balance was poor.    About 2011, she fell at church and broke her pelvis tripping over a gown.   Her gait continues to worsen.   Her right leg does well.   Her left arm is slightly clumsy and slightly weaker compared to her right.     All of her changes have been gradual.     She denies any numbness.   In 2014, she  had MRIs showing many T2/FLAIR hyperintense foci in the brain. At 89,  the pattern was felt to be nonspecific. However, MRI of the cervical and thoracic spine showed a plaque adjacent to C2 to the right and a plaque adjacent to T6 to the left  IMAGING: MRIs of her brain and spine in 11/11/2013 were reviewed.   The MRI of the cervical spine shows a focus to the right at C2 and another focus to the left at C6. It also shows multilevel degenerative changes with anterolisthesis of C4 upon C5 but no nerve root compression.  No spinal cord compression.  The MRI of the thoracic spine shows a focus to the left adjacent to T6. It also showed a chronic T9 compression fracture (she was never aware of this).  MRI of the brain shows many T2/FLAIR hyperintense foci.  Foci are non-specific though many of the foci are radially oriented to the ventricles in the periventricular white matter. Some foci in the pons have an appearance more typical for chronic microvascular ischemic  change. MRI 2014 showed a chronic compression fracture at T9 and she can not think of no other episode of significant back pain.  CT scan of the head 03/26/2023 shows a left thalamic hemorrhage with surrounding edema.  Elsewhere the hemispheres are hypodensities consistent with demyelination and/or chronic microvascular ischemic change.  MRI of the head 03/26/2023 shows the thalamic hemorrhage with surrounding edema.  There are scattered T2/FLAIR hyperintense foci in the left thalamus (where she had an old stroke) and in the hemispheres.  FH: Her one paternal first cousin and 2 maternal cousins have MS.    She does not know whether they had PPMS or RRMS.    REVIEW OF SYSTEMS: Constitutional: No fevers, chills, sweats, or change in appetite.   Some fatigue.   Occ insomnia (sleep maintenance).    Pedal edema.   Eyes: No visual changes, double vision, eye pain Ear, nose and throat: No hearing loss, ear pain, nasal congestion, sore throat Cardiovascular: No chest pain, palpitations.   Echo shows grade 2 diastolic dysfunction.   Respiratory:  No shortness of breath at rest or with exertion.   No wheezes.    GastrointestinaI: No nausea, vomiting, diarrhea, abdominal pain, fecal incontinence Genitourinary:  see above.  .    Musculoskeletal:  No current neck pain, back pain Integumentary: No rash, pruritus, skin lesions Neurological: as above Psychiatric: Mild depression but no anxiety Endocrine: No palpitations, diaphoresis, change in appetite, change in weigh or increased thirst Hematologic/Lymphatic:  No anemia, purpura, petechiae. Allergic/Immunologic: No itchy/runny eyes, nasal congestion, recent allergic reactions, rashes  ALLERGIES: Allergies  Allergen Reactions   Sulfasalazine Anaphylaxis   Aspirin Nausea Only   Penicillins Diarrhea   Sulfa Antibiotics    Sulfamethoxazole-Trimethoprim Other (See Comments)    HOME MEDICATIONS:  Current Outpatient Medications:    acetaminophen  (TYLENOL) 325 MG tablet, Take 2 tablets (650 mg total) by mouth every 6 (six) hours as needed for mild pain (or temp > 37.5 C (99.5 F))., Disp: , Rfl:    amLODipine (NORVASC) 10 MG tablet, Take 1 tablet (10 mg total) by mouth daily., Disp: 30 tablet, Rfl: 3   atorvastatin (LIPITOR) 80 MG tablet, Take 1 tablet (80 mg total) by mouth daily., Disp: 30 tablet, Rfl: 1   Calcium Carb-Cholecalciferol (CALCIUM-VITAMIN D3) 250-125 MG-UNIT TABS, Take 1 tablet by mouth daily., Disp: , Rfl:    Carboxymethylcellul-Glycerin (LUBRICATING EYE DROPS OP), Place 1 drop into both eyes 2 (two)  times daily. For Eye Dryness, Disp: , Rfl:    ciprofloxacin (CIPRO) 500 MG tablet, Take 500 mg by mouth 2 (two) times daily. X 5 days, Disp: , Rfl:    levothyroxine (SYNTHROID) 25 MCG tablet, Take 1 tablet (25 mcg total) by mouth daily., Disp: 30 tablet, Rfl: 0   losartan (COZAAR) 50 MG tablet, Take 1 tablet (50 mg total) by mouth daily., Disp: 30 tablet, Rfl: 3   Magnesium 250 MG TABS, Take 1 tablet by mouth daily., Disp: , Rfl:    Multiple Vitamins-Minerals (PRESERVISION AREDS 2 PO), Take by mouth. 1 units BID, Disp: , Rfl:    Vitamin D, Ergocalciferol, (DRISDOL) 1.25 MG (50000 UNIT) CAPS capsule, Take 50,000 Units by mouth every 7 (seven) days., Disp: , Rfl:    baclofen (LIORESAL) 10 MG tablet, One po qAM, one po qPM and 2 po qHS, Disp: 120 tablet, Rfl: 11   nabumetone (RELAFEN) 500 MG tablet, Take 1 tablet (500 mg total) by mouth 2 (two) times daily as needed., Disp: 60 tablet, Rfl: 11  PAST MEDICAL HISTORY: Past Medical History:  Diagnosis Date   Chronic venous insufficiency of lower extremity 01/25/2019   With chronic bilateral lower extremity edema   Incontinent of urine    Multiple sclerosis (HCC) 2015   Osteoporosis    osteopenia   Polyarthritis    Post-menopausal    Rheumatoid arthritis (HCC)    Temporal arteritis (HCC) 2011    PAST SURGICAL HISTORY: Past Surgical History:  Procedure Laterality Date    ABDOMINAL HYSTERECTOMY  1971   TVH   breast ductectomy Right 05/1995   COLONOSCOPY  10/2011    FAMILY HISTORY: Family History  Problem Relation Age of Onset   Osteoarthritis Father    Stroke Father    Heart failure Mother    Hypertension Mother    Heart disease Mother    Multiple sclerosis Cousin    Multiple sclerosis Cousin    Multiple sclerosis Cousin     SOCIAL HISTORY:  Social History   Socioeconomic History   Marital status: Widowed    Spouse name: Not on file   Number of children: 2   Years of education: Not on file   Highest education level: Not on file  Occupational History   Not on file  Tobacco Use   Smoking status: Never   Smokeless tobacco: Never  Substance and Sexual Activity   Alcohol use: No   Drug use: No   Sexual activity: Not Currently    Partners: Male    Birth control/protection: Post-menopausal, Surgical    Comment: hysterectomy  Other Topics Concern   Not on file  Social History Narrative   Widowed mother of 2, grandmother 5 with 2 great-grandchildren.      Tries to workout at the [facility] several days a week 30 minutes at a time on stairstepper or elliptical.   Social Determinants of Health   Financial Resource Strain: Not on file  Food Insecurity: No Food Insecurity (06/26/2021)   Hunger Vital Sign    Worried About Running Out of Food in the Last Year: Never true    Ran Out of Food in the Last Year: Never true  Transportation Needs: No Transportation Needs (06/18/2021)   PRAPARE - Administrator, Civil Service (Medical): No    Lack of Transportation (Non-Medical): No  Physical Activity: Not on file  Stress: Not on file  Social Connections: Not on file  Intimate Partner Violence: Not on file  PHYSICAL EXAM  Vitals:   05/14/23 1251  BP: (!) 97/49  Pulse: 67  Height: 5\' 7"  (1.702 m)    Body mass index is 19.2 kg/m.   General: The patient is well-developed and well-nourished and in no acute distress   Skin:  Extremities show severe bilateral pitting pedal edema.   Neurologic Exam  Mental status: The patient is alert and oriented x 3 at the time of the examination. The patient has apparent normal recent and remote memory, with an apparently normal attention span and concentration ability.   Speech is normal.  Cranial nerves: Extraocular movements are full. The facial strength and facial sensation are normal. Trapezius strength is normal.  The tongue is midline, and the patient has symmetric elevation of the soft palate.  Voice is slurred but understandable.  No obvious hearing deficits are noted.  Motor:  Muscle bulk is normal.   Tone is increased in the legs, left > right.   Strength is 4/5 in the right arm and 4+/5 in the left arm and she has reduced left RAM).  Strength is 3 to 4-/5 in the right leg and 4 -/5 in left leg.  Rapid altering movements are reduced in the right hand  Sensory: Sensory testing shows reduced touch and vibration on her right leg relative to the left but reduced right hand vibration and temperature sensations on the left leg worse than left arm.  Reduced sensation to vibration at the toes.  Coordination: Cerebellar testing shows good finger-nose-finger but reduced left heel-to-shin.  Gait and station: Station is normal.   She is unable to walk . Romberg is negative.  Reflexes: Deep tendon reflexes are symmetric and normal bilaterally.   Plantar responses are extensor on her left.    DIAGNOSTIC DATA (LABS, IMAGING, TESTING) - I reviewed patient records, labs, notes, testing and imaging myself where available.  Lab Results  Component Value Date   WBC 5.6 03/28/2023   HGB 12.5 03/28/2023   HCT 35.7 (L) 03/28/2023   MCV 91.8 03/28/2023   PLT 201 03/28/2023      Component Value Date/Time   NA 135 03/28/2023 0350   NA 135 (A) 06/05/2020 0000   K 3.9 03/28/2023 0350   CL 105 03/28/2023 0350   CO2 24 03/28/2023 0350   GLUCOSE 99 03/28/2023 0350   BUN 11 03/28/2023  0350   BUN 17 06/05/2020 0000   CREATININE 0.58 03/28/2023 0350   CREATININE 0.82 02/23/2019 1444   CALCIUM 8.9 03/28/2023 0350   PROT 6.2 (L) 03/28/2023 0350   ALBUMIN 3.0 (L) 03/28/2023 0350   AST 27 03/28/2023 0350   AST 27 02/23/2019 1444   ALT 20 03/28/2023 0350   ALT 27 02/23/2019 1444   ALKPHOS 73 03/28/2023 0350   BILITOT 0.7 03/28/2023 0350   BILITOT 0.4 02/23/2019 1444   GFRNONAA >60 03/28/2023 0350   GFRNONAA >60 02/23/2019 1444   GFRAA >90 06/05/2020 0000   GFRAA >60 02/23/2019 1444       ASSESSMENT AND PLAN  Multiple sclerosis, primary progressive (HCC)  Stroke of right basal ganglia (HCC)  S/P right hip fracture  Wheelchair dependent  Urinary dysfunction  Slurred speech  Dysphagia, unspecified type   1.   She has a recent hemorrhagic stroke and has dramatically improved from her nadir in mid April.  She still has some dysphagia, mild slurred speech and mild reduced strength on the right.  She is working with PT and OT and speech therapy.  2.    She will continue off of a DMT.  She appears to have PPMS though I can't rule out inactive SPMS.  Progression has been very slow and risks of Ocrevus would outweigh the benefits.   Now off all RA DMT'sShe was on Kevzara (MOA is anti-IL6).  Previously was on MTX/leflunomide 2.   Stay active as possible.  Use wheelchair for safety.   Increase activity as tolerated 3.   She takes baclofen 10 - 10 - 20 mg over the day (sometimes takes fewer pills) this will be renewed.  I also renewed the Relafen which has helped her pain some 4.   rtc 12 months or sooner if new or worsening neurologic issues.   Can alternate visits with NP.   42 minutes office visit with the majority of the time spent face-to-face for history and physical, discussion/counseling and decision-making.  Additional time with record review and documentatio  This visit is part of a comprehensive longitudinal care medical relationship regarding the patients  primary diagnosis of multiple sclerosis and stroke and related concerns.[name]. Epimenio Foot, MD, PhD, [place]   05/14/2023, 1:52 PM Director of the MS Center at [facility] Certified in Neurology, Clinical Neurophysiology, Sleep Medicine, Pain Medicine and Neuroimaging  [facility] [address], [address] [place], [place] [address] [phone]

## 2023-05-16 DIAGNOSIS — I69354 Hemiplegia and hemiparesis following cerebral infarction affecting left non-dominant side: Secondary | ICD-10-CM | POA: Diagnosis not present

## 2023-05-16 DIAGNOSIS — I69351 Hemiplegia and hemiparesis following cerebral infarction affecting right dominant side: Secondary | ICD-10-CM | POA: Diagnosis not present

## 2023-05-16 DIAGNOSIS — M069 Rheumatoid arthritis, unspecified: Secondary | ICD-10-CM | POA: Diagnosis not present

## 2023-05-16 DIAGNOSIS — G35 Multiple sclerosis: Secondary | ICD-10-CM | POA: Diagnosis not present

## 2023-05-16 DIAGNOSIS — I110 Hypertensive heart disease with heart failure: Secondary | ICD-10-CM | POA: Diagnosis not present

## 2023-05-16 DIAGNOSIS — I509 Heart failure, unspecified: Secondary | ICD-10-CM | POA: Diagnosis not present

## 2023-05-19 DIAGNOSIS — I69354 Hemiplegia and hemiparesis following cerebral infarction affecting left non-dominant side: Secondary | ICD-10-CM | POA: Diagnosis not present

## 2023-05-19 DIAGNOSIS — G35 Multiple sclerosis: Secondary | ICD-10-CM | POA: Diagnosis not present

## 2023-05-19 DIAGNOSIS — M069 Rheumatoid arthritis, unspecified: Secondary | ICD-10-CM | POA: Diagnosis not present

## 2023-05-19 DIAGNOSIS — I69351 Hemiplegia and hemiparesis following cerebral infarction affecting right dominant side: Secondary | ICD-10-CM | POA: Diagnosis not present

## 2023-05-19 DIAGNOSIS — I110 Hypertensive heart disease with heart failure: Secondary | ICD-10-CM | POA: Diagnosis not present

## 2023-05-19 DIAGNOSIS — I509 Heart failure, unspecified: Secondary | ICD-10-CM | POA: Diagnosis not present

## 2023-05-20 DIAGNOSIS — I69351 Hemiplegia and hemiparesis following cerebral infarction affecting right dominant side: Secondary | ICD-10-CM | POA: Diagnosis not present

## 2023-05-20 DIAGNOSIS — I69354 Hemiplegia and hemiparesis following cerebral infarction affecting left non-dominant side: Secondary | ICD-10-CM | POA: Diagnosis not present

## 2023-05-20 DIAGNOSIS — I509 Heart failure, unspecified: Secondary | ICD-10-CM | POA: Diagnosis not present

## 2023-05-20 DIAGNOSIS — G35 Multiple sclerosis: Secondary | ICD-10-CM | POA: Diagnosis not present

## 2023-05-20 DIAGNOSIS — M069 Rheumatoid arthritis, unspecified: Secondary | ICD-10-CM | POA: Diagnosis not present

## 2023-05-20 DIAGNOSIS — I110 Hypertensive heart disease with heart failure: Secondary | ICD-10-CM | POA: Diagnosis not present

## 2023-05-21 DIAGNOSIS — G35 Multiple sclerosis: Secondary | ICD-10-CM | POA: Diagnosis not present

## 2023-05-21 DIAGNOSIS — I509 Heart failure, unspecified: Secondary | ICD-10-CM | POA: Diagnosis not present

## 2023-05-21 DIAGNOSIS — M069 Rheumatoid arthritis, unspecified: Secondary | ICD-10-CM | POA: Diagnosis not present

## 2023-05-21 DIAGNOSIS — I110 Hypertensive heart disease with heart failure: Secondary | ICD-10-CM | POA: Diagnosis not present

## 2023-05-21 DIAGNOSIS — I69351 Hemiplegia and hemiparesis following cerebral infarction affecting right dominant side: Secondary | ICD-10-CM | POA: Diagnosis not present

## 2023-05-21 DIAGNOSIS — I69354 Hemiplegia and hemiparesis following cerebral infarction affecting left non-dominant side: Secondary | ICD-10-CM | POA: Diagnosis not present

## 2023-05-23 DIAGNOSIS — I69351 Hemiplegia and hemiparesis following cerebral infarction affecting right dominant side: Secondary | ICD-10-CM | POA: Diagnosis not present

## 2023-05-23 DIAGNOSIS — M069 Rheumatoid arthritis, unspecified: Secondary | ICD-10-CM | POA: Diagnosis not present

## 2023-05-23 DIAGNOSIS — I69354 Hemiplegia and hemiparesis following cerebral infarction affecting left non-dominant side: Secondary | ICD-10-CM | POA: Diagnosis not present

## 2023-05-23 DIAGNOSIS — I110 Hypertensive heart disease with heart failure: Secondary | ICD-10-CM | POA: Diagnosis not present

## 2023-05-23 DIAGNOSIS — G35 Multiple sclerosis: Secondary | ICD-10-CM | POA: Diagnosis not present

## 2023-05-23 DIAGNOSIS — I509 Heart failure, unspecified: Secondary | ICD-10-CM | POA: Diagnosis not present

## 2023-05-26 DIAGNOSIS — I69354 Hemiplegia and hemiparesis following cerebral infarction affecting left non-dominant side: Secondary | ICD-10-CM | POA: Diagnosis not present

## 2023-05-26 DIAGNOSIS — M069 Rheumatoid arthritis, unspecified: Secondary | ICD-10-CM | POA: Diagnosis not present

## 2023-05-26 DIAGNOSIS — G35 Multiple sclerosis: Secondary | ICD-10-CM | POA: Diagnosis not present

## 2023-05-26 DIAGNOSIS — I69351 Hemiplegia and hemiparesis following cerebral infarction affecting right dominant side: Secondary | ICD-10-CM | POA: Diagnosis not present

## 2023-05-26 DIAGNOSIS — I110 Hypertensive heart disease with heart failure: Secondary | ICD-10-CM | POA: Diagnosis not present

## 2023-05-26 DIAGNOSIS — I509 Heart failure, unspecified: Secondary | ICD-10-CM | POA: Diagnosis not present

## 2023-05-27 DIAGNOSIS — I69351 Hemiplegia and hemiparesis following cerebral infarction affecting right dominant side: Secondary | ICD-10-CM | POA: Diagnosis not present

## 2023-05-27 DIAGNOSIS — I110 Hypertensive heart disease with heart failure: Secondary | ICD-10-CM | POA: Diagnosis not present

## 2023-05-27 DIAGNOSIS — G35 Multiple sclerosis: Secondary | ICD-10-CM | POA: Diagnosis not present

## 2023-05-27 DIAGNOSIS — M069 Rheumatoid arthritis, unspecified: Secondary | ICD-10-CM | POA: Diagnosis not present

## 2023-05-27 DIAGNOSIS — I509 Heart failure, unspecified: Secondary | ICD-10-CM | POA: Diagnosis not present

## 2023-05-27 DIAGNOSIS — I69354 Hemiplegia and hemiparesis following cerebral infarction affecting left non-dominant side: Secondary | ICD-10-CM | POA: Diagnosis not present

## 2023-05-28 DIAGNOSIS — I69354 Hemiplegia and hemiparesis following cerebral infarction affecting left non-dominant side: Secondary | ICD-10-CM | POA: Diagnosis not present

## 2023-05-28 DIAGNOSIS — G35 Multiple sclerosis: Secondary | ICD-10-CM | POA: Diagnosis not present

## 2023-05-28 DIAGNOSIS — I69351 Hemiplegia and hemiparesis following cerebral infarction affecting right dominant side: Secondary | ICD-10-CM | POA: Diagnosis not present

## 2023-05-28 DIAGNOSIS — I110 Hypertensive heart disease with heart failure: Secondary | ICD-10-CM | POA: Diagnosis not present

## 2023-05-28 DIAGNOSIS — M069 Rheumatoid arthritis, unspecified: Secondary | ICD-10-CM | POA: Diagnosis not present

## 2023-05-28 DIAGNOSIS — I509 Heart failure, unspecified: Secondary | ICD-10-CM | POA: Diagnosis not present

## 2023-05-29 DIAGNOSIS — G35 Multiple sclerosis: Secondary | ICD-10-CM | POA: Diagnosis not present

## 2023-05-29 DIAGNOSIS — M069 Rheumatoid arthritis, unspecified: Secondary | ICD-10-CM | POA: Diagnosis not present

## 2023-05-29 DIAGNOSIS — I69354 Hemiplegia and hemiparesis following cerebral infarction affecting left non-dominant side: Secondary | ICD-10-CM | POA: Diagnosis not present

## 2023-05-29 DIAGNOSIS — I69351 Hemiplegia and hemiparesis following cerebral infarction affecting right dominant side: Secondary | ICD-10-CM | POA: Diagnosis not present

## 2023-05-29 DIAGNOSIS — I509 Heart failure, unspecified: Secondary | ICD-10-CM | POA: Diagnosis not present

## 2023-05-29 DIAGNOSIS — I110 Hypertensive heart disease with heart failure: Secondary | ICD-10-CM | POA: Diagnosis not present

## 2023-06-02 DIAGNOSIS — I69351 Hemiplegia and hemiparesis following cerebral infarction affecting right dominant side: Secondary | ICD-10-CM | POA: Diagnosis not present

## 2023-06-02 DIAGNOSIS — I69354 Hemiplegia and hemiparesis following cerebral infarction affecting left non-dominant side: Secondary | ICD-10-CM | POA: Diagnosis not present

## 2023-06-02 DIAGNOSIS — M069 Rheumatoid arthritis, unspecified: Secondary | ICD-10-CM | POA: Diagnosis not present

## 2023-06-02 DIAGNOSIS — I110 Hypertensive heart disease with heart failure: Secondary | ICD-10-CM | POA: Diagnosis not present

## 2023-06-02 DIAGNOSIS — I509 Heart failure, unspecified: Secondary | ICD-10-CM | POA: Diagnosis not present

## 2023-06-02 DIAGNOSIS — G35 Multiple sclerosis: Secondary | ICD-10-CM | POA: Diagnosis not present

## 2023-06-04 DIAGNOSIS — I872 Venous insufficiency (chronic) (peripheral): Secondary | ICD-10-CM | POA: Diagnosis not present

## 2023-06-04 DIAGNOSIS — E113592 Type 2 diabetes mellitus with proliferative diabetic retinopathy without macular edema, left eye: Secondary | ICD-10-CM | POA: Diagnosis not present

## 2023-06-04 DIAGNOSIS — E559 Vitamin D deficiency, unspecified: Secondary | ICD-10-CM | POA: Diagnosis not present

## 2023-06-04 DIAGNOSIS — I69391 Dysphagia following cerebral infarction: Secondary | ICD-10-CM | POA: Diagnosis not present

## 2023-06-04 DIAGNOSIS — G35 Multiple sclerosis: Secondary | ICD-10-CM | POA: Diagnosis not present

## 2023-06-04 DIAGNOSIS — M069 Rheumatoid arthritis, unspecified: Secondary | ICD-10-CM | POA: Diagnosis not present

## 2023-06-04 DIAGNOSIS — I110 Hypertensive heart disease with heart failure: Secondary | ICD-10-CM | POA: Diagnosis not present

## 2023-06-04 DIAGNOSIS — I2510 Atherosclerotic heart disease of native coronary artery without angina pectoris: Secondary | ICD-10-CM | POA: Diagnosis not present

## 2023-06-04 DIAGNOSIS — E1151 Type 2 diabetes mellitus with diabetic peripheral angiopathy without gangrene: Secondary | ICD-10-CM | POA: Diagnosis not present

## 2023-06-04 DIAGNOSIS — M810 Age-related osteoporosis without current pathological fracture: Secondary | ICD-10-CM | POA: Diagnosis not present

## 2023-06-04 DIAGNOSIS — M130 Polyarthritis, unspecified: Secondary | ICD-10-CM | POA: Diagnosis not present

## 2023-06-04 DIAGNOSIS — E039 Hypothyroidism, unspecified: Secondary | ICD-10-CM | POA: Diagnosis not present

## 2023-06-04 DIAGNOSIS — Z9181 History of falling: Secondary | ICD-10-CM | POA: Diagnosis not present

## 2023-06-04 DIAGNOSIS — R300 Dysuria: Secondary | ICD-10-CM | POA: Diagnosis not present

## 2023-06-04 DIAGNOSIS — I69351 Hemiplegia and hemiparesis following cerebral infarction affecting right dominant side: Secondary | ICD-10-CM | POA: Diagnosis not present

## 2023-06-04 DIAGNOSIS — I509 Heart failure, unspecified: Secondary | ICD-10-CM | POA: Diagnosis not present

## 2023-06-04 DIAGNOSIS — Z993 Dependence on wheelchair: Secondary | ICD-10-CM | POA: Diagnosis not present

## 2023-06-04 DIAGNOSIS — I69354 Hemiplegia and hemiparesis following cerebral infarction affecting left non-dominant side: Secondary | ICD-10-CM | POA: Diagnosis not present

## 2023-06-04 DIAGNOSIS — E46 Unspecified protein-calorie malnutrition: Secondary | ICD-10-CM | POA: Diagnosis not present

## 2023-06-04 DIAGNOSIS — E7800 Pure hypercholesterolemia, unspecified: Secondary | ICD-10-CM | POA: Diagnosis not present

## 2023-06-04 DIAGNOSIS — I69322 Dysarthria following cerebral infarction: Secondary | ICD-10-CM | POA: Diagnosis not present

## 2023-06-05 DIAGNOSIS — I110 Hypertensive heart disease with heart failure: Secondary | ICD-10-CM | POA: Diagnosis not present

## 2023-06-05 DIAGNOSIS — I509 Heart failure, unspecified: Secondary | ICD-10-CM | POA: Diagnosis not present

## 2023-06-05 DIAGNOSIS — G35 Multiple sclerosis: Secondary | ICD-10-CM | POA: Diagnosis not present

## 2023-06-05 DIAGNOSIS — M069 Rheumatoid arthritis, unspecified: Secondary | ICD-10-CM | POA: Diagnosis not present

## 2023-06-05 DIAGNOSIS — I69351 Hemiplegia and hemiparesis following cerebral infarction affecting right dominant side: Secondary | ICD-10-CM | POA: Diagnosis not present

## 2023-06-05 DIAGNOSIS — I69354 Hemiplegia and hemiparesis following cerebral infarction affecting left non-dominant side: Secondary | ICD-10-CM | POA: Diagnosis not present

## 2023-06-09 DIAGNOSIS — I509 Heart failure, unspecified: Secondary | ICD-10-CM | POA: Diagnosis not present

## 2023-06-09 DIAGNOSIS — I110 Hypertensive heart disease with heart failure: Secondary | ICD-10-CM | POA: Diagnosis not present

## 2023-06-09 DIAGNOSIS — G35 Multiple sclerosis: Secondary | ICD-10-CM | POA: Diagnosis not present

## 2023-06-09 DIAGNOSIS — I69351 Hemiplegia and hemiparesis following cerebral infarction affecting right dominant side: Secondary | ICD-10-CM | POA: Diagnosis not present

## 2023-06-09 DIAGNOSIS — M069 Rheumatoid arthritis, unspecified: Secondary | ICD-10-CM | POA: Diagnosis not present

## 2023-06-09 DIAGNOSIS — I69354 Hemiplegia and hemiparesis following cerebral infarction affecting left non-dominant side: Secondary | ICD-10-CM | POA: Diagnosis not present

## 2023-06-12 DIAGNOSIS — M069 Rheumatoid arthritis, unspecified: Secondary | ICD-10-CM | POA: Diagnosis not present

## 2023-06-12 DIAGNOSIS — I110 Hypertensive heart disease with heart failure: Secondary | ICD-10-CM | POA: Diagnosis not present

## 2023-06-12 DIAGNOSIS — I69354 Hemiplegia and hemiparesis following cerebral infarction affecting left non-dominant side: Secondary | ICD-10-CM | POA: Diagnosis not present

## 2023-06-12 DIAGNOSIS — I69351 Hemiplegia and hemiparesis following cerebral infarction affecting right dominant side: Secondary | ICD-10-CM | POA: Diagnosis not present

## 2023-06-12 DIAGNOSIS — G35 Multiple sclerosis: Secondary | ICD-10-CM | POA: Diagnosis not present

## 2023-06-12 DIAGNOSIS — I509 Heart failure, unspecified: Secondary | ICD-10-CM | POA: Diagnosis not present

## 2023-06-16 DIAGNOSIS — I69354 Hemiplegia and hemiparesis following cerebral infarction affecting left non-dominant side: Secondary | ICD-10-CM | POA: Diagnosis not present

## 2023-06-16 DIAGNOSIS — I69351 Hemiplegia and hemiparesis following cerebral infarction affecting right dominant side: Secondary | ICD-10-CM | POA: Diagnosis not present

## 2023-06-16 DIAGNOSIS — I509 Heart failure, unspecified: Secondary | ICD-10-CM | POA: Diagnosis not present

## 2023-06-16 DIAGNOSIS — G35 Multiple sclerosis: Secondary | ICD-10-CM | POA: Diagnosis not present

## 2023-06-16 DIAGNOSIS — M069 Rheumatoid arthritis, unspecified: Secondary | ICD-10-CM | POA: Diagnosis not present

## 2023-06-16 DIAGNOSIS — I110 Hypertensive heart disease with heart failure: Secondary | ICD-10-CM | POA: Diagnosis not present

## 2023-06-19 DIAGNOSIS — I69354 Hemiplegia and hemiparesis following cerebral infarction affecting left non-dominant side: Secondary | ICD-10-CM | POA: Diagnosis not present

## 2023-06-19 DIAGNOSIS — I509 Heart failure, unspecified: Secondary | ICD-10-CM | POA: Diagnosis not present

## 2023-06-19 DIAGNOSIS — I110 Hypertensive heart disease with heart failure: Secondary | ICD-10-CM | POA: Diagnosis not present

## 2023-06-19 DIAGNOSIS — G35 Multiple sclerosis: Secondary | ICD-10-CM | POA: Diagnosis not present

## 2023-06-19 DIAGNOSIS — I69351 Hemiplegia and hemiparesis following cerebral infarction affecting right dominant side: Secondary | ICD-10-CM | POA: Diagnosis not present

## 2023-06-19 DIAGNOSIS — M069 Rheumatoid arthritis, unspecified: Secondary | ICD-10-CM | POA: Diagnosis not present

## 2023-06-23 DIAGNOSIS — I69354 Hemiplegia and hemiparesis following cerebral infarction affecting left non-dominant side: Secondary | ICD-10-CM | POA: Diagnosis not present

## 2023-06-23 DIAGNOSIS — M069 Rheumatoid arthritis, unspecified: Secondary | ICD-10-CM | POA: Diagnosis not present

## 2023-06-23 DIAGNOSIS — I509 Heart failure, unspecified: Secondary | ICD-10-CM | POA: Diagnosis not present

## 2023-06-23 DIAGNOSIS — I110 Hypertensive heart disease with heart failure: Secondary | ICD-10-CM | POA: Diagnosis not present

## 2023-06-23 DIAGNOSIS — I69351 Hemiplegia and hemiparesis following cerebral infarction affecting right dominant side: Secondary | ICD-10-CM | POA: Diagnosis not present

## 2023-06-23 DIAGNOSIS — G35 Multiple sclerosis: Secondary | ICD-10-CM | POA: Diagnosis not present

## 2023-06-24 DIAGNOSIS — G35 Multiple sclerosis: Secondary | ICD-10-CM | POA: Diagnosis not present

## 2023-06-24 DIAGNOSIS — I69354 Hemiplegia and hemiparesis following cerebral infarction affecting left non-dominant side: Secondary | ICD-10-CM | POA: Diagnosis not present

## 2023-06-24 DIAGNOSIS — I69351 Hemiplegia and hemiparesis following cerebral infarction affecting right dominant side: Secondary | ICD-10-CM | POA: Diagnosis not present

## 2023-06-24 DIAGNOSIS — I110 Hypertensive heart disease with heart failure: Secondary | ICD-10-CM | POA: Diagnosis not present

## 2023-06-24 DIAGNOSIS — M069 Rheumatoid arthritis, unspecified: Secondary | ICD-10-CM | POA: Diagnosis not present

## 2023-06-24 DIAGNOSIS — I509 Heart failure, unspecified: Secondary | ICD-10-CM | POA: Diagnosis not present

## 2023-06-26 DIAGNOSIS — H903 Sensorineural hearing loss, bilateral: Secondary | ICD-10-CM | POA: Diagnosis not present

## 2023-06-26 DIAGNOSIS — H7291 Unspecified perforation of tympanic membrane, right ear: Secondary | ICD-10-CM | POA: Diagnosis not present

## 2023-06-26 DIAGNOSIS — H6041 Cholesteatoma of right external ear: Secondary | ICD-10-CM | POA: Diagnosis not present

## 2023-07-01 DIAGNOSIS — M069 Rheumatoid arthritis, unspecified: Secondary | ICD-10-CM | POA: Diagnosis not present

## 2023-07-01 DIAGNOSIS — I110 Hypertensive heart disease with heart failure: Secondary | ICD-10-CM | POA: Diagnosis not present

## 2023-07-01 DIAGNOSIS — I69351 Hemiplegia and hemiparesis following cerebral infarction affecting right dominant side: Secondary | ICD-10-CM | POA: Diagnosis not present

## 2023-07-01 DIAGNOSIS — I509 Heart failure, unspecified: Secondary | ICD-10-CM | POA: Diagnosis not present

## 2023-07-01 DIAGNOSIS — I69354 Hemiplegia and hemiparesis following cerebral infarction affecting left non-dominant side: Secondary | ICD-10-CM | POA: Diagnosis not present

## 2023-07-01 DIAGNOSIS — G35 Multiple sclerosis: Secondary | ICD-10-CM | POA: Diagnosis not present

## 2023-07-07 ENCOUNTER — Ambulatory Visit: Payer: Medicare Other | Admitting: Neurology

## 2023-07-15 ENCOUNTER — Encounter: Payer: Self-pay | Admitting: Podiatry

## 2023-07-15 ENCOUNTER — Ambulatory Visit: Payer: Medicare Other | Admitting: Podiatry

## 2023-07-15 DIAGNOSIS — M79675 Pain in left toe(s): Secondary | ICD-10-CM | POA: Diagnosis not present

## 2023-07-15 DIAGNOSIS — G629 Polyneuropathy, unspecified: Secondary | ICD-10-CM | POA: Diagnosis not present

## 2023-07-15 DIAGNOSIS — L84 Corns and callosities: Secondary | ICD-10-CM

## 2023-07-15 DIAGNOSIS — B351 Tinea unguium: Secondary | ICD-10-CM | POA: Diagnosis not present

## 2023-07-15 DIAGNOSIS — M79674 Pain in right toe(s): Secondary | ICD-10-CM

## 2023-07-15 NOTE — Patient Instructions (Signed)
Apply Neosporin Cream to both great toes once daily for one week. Call office if you have any problems. The bottom of your left foot is fine. Continue heel protector at all times to avoid pressure.

## 2023-07-21 ENCOUNTER — Encounter (HOSPITAL_BASED_OUTPATIENT_CLINIC_OR_DEPARTMENT_OTHER): Payer: Medicare Other | Attending: Internal Medicine | Admitting: Internal Medicine

## 2023-07-21 DIAGNOSIS — M1990 Unspecified osteoarthritis, unspecified site: Secondary | ICD-10-CM | POA: Insufficient documentation

## 2023-07-21 DIAGNOSIS — L989 Disorder of the skin and subcutaneous tissue, unspecified: Secondary | ICD-10-CM

## 2023-07-21 DIAGNOSIS — H938X2 Other specified disorders of left ear: Secondary | ICD-10-CM | POA: Insufficient documentation

## 2023-07-21 DIAGNOSIS — E039 Hypothyroidism, unspecified: Secondary | ICD-10-CM | POA: Diagnosis not present

## 2023-07-21 DIAGNOSIS — Z993 Dependence on wheelchair: Secondary | ICD-10-CM | POA: Diagnosis not present

## 2023-07-21 DIAGNOSIS — G35 Multiple sclerosis: Secondary | ICD-10-CM | POA: Diagnosis not present

## 2023-07-21 NOTE — Progress Notes (Signed)
[name], [name] ([id number]) [id number] Page 1 of 6 Visit Report for 07/21/2023 Chief Complaint Document Details Patient Name: Date of Service: [name], [name] 07/21/2023 9:30 A M Medical Record Number: [id number] Patient Account Number: [id number] Date of Birth/Sex: Treating RN: 1932/11/03 (89 y.o. F) Primary Care Provider: Thora Lance Other Clinician: Referring Provider: Treating Provider/Extender: Bertrum Sol in Treatment: 0 Information Obtained from: Patient Chief Complaint 07/21/2023; left ear Lesion Electronic Signature(s) Signed: 07/21/2023 12:36:03 PM By: Geralyn Corwin DO Entered By: Geralyn Corwin on 07/21/2023 10:24:24 -------------------------------------------------------------------------------- HPI Details Patient Name: Date of Service: [name]. 07/21/2023 9:30 A M Medical Record Number: [id number] Patient Account Number: [id number] Date of Birth/Sex: Treating RN: [birth year] (89 y.o. F) Primary Care Provider: Thora Lance Other Clinician: Referring Provider: Treating Provider/Extender: Bertrum Sol in Treatment: 0 History of Present Illness HPI Description: 07/21/2023 [name] is an 89 year old female with a past medical history of multiple sclerosis with wheelchair dependence that presents to the clinic for a 1.5 year history of lesion to the left ear. She states that she was seen by [facility] dermatology 1-1/2 years ago and given a steroid injection to the site. She used steroid cream for a little while but stopped using it since she did not notice a difference in the lesion. She reports that the area has remained stable in size. She denies any drainage. She denies signs of infection. Electronic Signature(s) Signed: 07/21/2023 12:36:03 PM By: Geralyn Corwin DO Entered By: Geralyn Corwin on 07/21/2023  10:56:39 -------------------------------------------------------------------------------- Physical Exam Details Patient Name: Date of Service: [name]. 07/21/2023 9:30 A M Medical Record Number: [id number] Patient Account Number: [id number] Date of Birth/Sex: Treating RN: 1931-12-27 (89 y.o. F) Primary Care Provider: Thora Lance Other Clinician: Referring Provider: Treating Provider/Extender: [name], [name], [name] ([id number]) [id number] Page 2 of 6 Weeks in Treatment: 0 Constitutional respirations regular, non-labored and within target range for patient.Marland Kitchen Psychiatric pleasant and cooperative. Notes Crusted lesion to the external upper left ear. No signs of surrounding infection including increased warmth, erythema or drainage. Electronic Signature(s) Signed: 07/21/2023 12:36:03 PM By: Geralyn Corwin DO Entered By: Geralyn Corwin on 07/21/2023 10:57:07 -------------------------------------------------------------------------------- Physician Orders Details Patient Name: Date of Service: [name]. 07/21/2023 9:30 A M Medical Record Number: [id number] Patient Account Number: [id number] Date of Birth/Sex: Treating RN: 1932-11-10 (89 y.o. Arta Silence Primary Care Provider: Thora Lance Other Clinician: Referring Provider: Treating Provider/Extender: Bertrum Sol in Treatment: 0 Verbal / Phone Orders: No Diagnosis Coding ICD-10 Coding Code Description G35 Multiple sclerosis Z99.3 Dependence on wheelchair I63.9 Cerebral infarction, unspecified Discharge From [facility] Services Discharge from Wound Care Center - Call if any future wound care needs. 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 Consults Dermatology - Referral to [facility] related to left ear overgrowth. Patient is a current patient with your office. Electronic  Signature(s) Signed: 07/21/2023 12:36:03 PM By: Geralyn Corwin DO Entered By: Geralyn Corwin on 07/21/2023 10:57:14 Prescription 07/21/2023 -------------------------------------------------------------------------------- Madlyn Frankel DO Patient Name: Provider: August 08, 1932 [id number] Date of Birth: NPI#Horton Chin Sex: DEA #: [phone] Phone #: License #: UPN: Patient Address: [address] Eligha Bridegroom [facility] [place], [place] [address] 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 Patient is a current patient with your office. Hand Signature: Date(s): Electronic Signature(s) Signed: 07/21/2023 12:36:03 PM By: Geralyn Corwin DO Entered By: Geralyn Corwin on 07/21/2023 10:57:14 -------------------------------------------------------------------------------- Problem List Details Patient Name: Date of Service: [name]. 07/21/2023 9:30 A M Medical Record Number: [id number] Patient Account Number: [id number] Date of Birth/Sex: Treating RN: 19-Feb-1932 (89 y.o. F) Primary Care Provider: Thora Lance Other Clinician: Referring Provider: Treating Provider/Extender: Bertrum Sol in Treatment: 0 Active Problems ICD-10 Encounter Code Description Active Date MDM Diagnosis L98.9 Disorder of the skin and subcutaneous tissue, unspecified 07/21/2023 No Yes H93.8X2 Other specified disorders of left ear 07/21/2023 No Yes G35 Multiple sclerosis 07/21/2023 No Yes Z99.3 Dependence on wheelchair 07/21/2023 No Yes Inactive Problems Resolved Problems Electronic Signature(s) Signed: 07/21/2023 12:36:03 PM By: Geralyn Corwin DO Entered By:  Geralyn Corwin on 07/21/2023 10:24:12 [name] ([id number]) [id number] Page 4 of 6 -------------------------------------------------------------------------------- Progress Note Details Patient Name: Date of Service: [name] 07/21/2023 9:30 A M Medical Record Number: [id number] Patient Account Number: [id number] Date of Birth/Sex: Treating RN: 1932-01-10 (89 y.o. F) Primary Care Provider: Thora Lance Other Clinician: Referring Provider: Treating Provider/Extender: Bertrum Sol in Treatment: 0 Subjective Chief Complaint Information obtained from Patient 07/21/2023; left ear Lesion History of Present Illness (HPI) 07/21/2023 [name] is an 89 year old female with a past medical history of multiple sclerosis with wheelchair dependence that presents to the clinic for a 1.5 year history of lesion to the left ear. She states that she was seen by [facility] dermatology 1-1/2 years ago and given a steroid injection to the site. She used steroid cream for a little while but stopped using it since she did not notice a difference in the lesion. She reports that the area has remained stable in size. She denies any drainage. She denies signs of infection. Patient History Information obtained from Chart. Allergies Sulfa (Sulfonamide Antibiotics), penicillin Family History Heart Disease - Mother, Stroke - Father,Mother. Social History Never smoker, Marital Status - Widowed, Alcohol Use - Never, Drug Use - No History, Caffeine Use - Never. Medical History Musculoskeletal Patient has history of Osteoarthritis Denies history of Rheumatoid Arthritis Medical A Surgical History Notes nd Constitutional Symptoms (General Health) 1.5 years of a wound to left ear used various creams and ointments. hypothyroidism vitamin D deficiency protein calorie malnutrition CVA x2 Musculoskeletal osteoporosis  MS Objective Constitutional respirations regular, non-labored and within target range for patient.. Vitals Time Taken: 9:33 AM, Height: 66 in, Source: Stated, Weight: 100 lbs, Source: Stated, BMI: 16.1, Temperature: 98.3 F, Pulse: 65 bpm, Respiratory Rate: 16 breaths/min, Blood Pressure: 103/59 mmHg, Pulse Oximetry: 96 %. Psychiatric pleasant and cooperative. General Notes: Crusted lesion to the external upper left ear. No signs of surrounding infection including increased warmth, erythema or drainage. Assessment Active Problems ICD-10 Disorder of the skin and subcutaneous tissue, unspecified Other specified disorders of left ear Multiple sclerosis Dependence on wheelchair [name], [name] ([id number]) [phone] Page 5 of 6 Patient presents with a 1-1/2-year history of lesion to the left ear that has remained stable in size and color over time. She has tried steroid injections and steroid creams without benefit. At this time I recommended she follow-up with dermatology (she is an established patient at [facility] dermatology) as they will be better to assess this skin condition. She may follow-up as needed Plan Discharge From  [facility] Services: Discharge from Wound Care Center - Call if any future wound care needs. 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 Patient is a current patient with your office. 1. Referral to dermatology 2. Follow-up as needed Electronic Signature(s) Signed: 07/21/2023 12:36:03 PM By: Geralyn Corwin DO Entered By: Geralyn Corwin on 07/21/2023 10:59:06 -------------------------------------------------------------------------------- HxROS Details Patient Name: Date of Service: [name]. 07/21/2023 9:30 A M Medical Record Number: [id number] Patient Account Number: [id number] Date of Birth/Sex:  Treating RN: 1932-09-05 (89 y.o. Arta Silence Primary Care Provider: Thora Lance Other Clinician: Referring Provider: Treating Provider/Extender: Bertrum Sol in Treatment: 0 Information Obtained From Chart Constitutional Symptoms (General Health) Medical History: Past Medical History Notes: 1.5 years of a wound to left ear used various creams and ointments. hypothyroidism vitamin D deficiency protein calorie malnutrition CVA x2 Musculoskeletal Medical History: Positive for: Osteoarthritis Negative for: Rheumatoid Arthritis Past Medical History Notes: osteoporosis MS Immunizations Pneumococcal Vaccine: Received Pneumococcal Vaccination: Yes Received Pneumococcal Vaccination On or After 60th Birthday: Yes Implantable Devices No devices added Family and Social History Heart Disease: Yes - Mother; Stroke: Yes - Associate Professor; Never smoker; Marital Status - Widowed; Alcohol Use: Never; Drug Use: No History; Caffeine Use: Never; Financial Concerns: No; Food, Clothing or Shelter Needs: No; Support System Lacking: No; Transportation Concerns: No [name], [name] ([id number]) [id number] Page 6 of 6 Electronic Signature(s) Signed: 07/21/2023 12:36:03 PM By: Geralyn Corwin DO Signed: 07/21/2023 3:26:57 PM By: [name] RN, BSN Entered By: [name] on 07/21/2023 09:37:17 -------------------------------------------------------------------------------- SuperBill Details Patient Name: Date of Service: [name]. 07/21/2023 Medical Record Number: [id number] Patient Account Number: [id number] Date of Birth/Sex: Treating RN: [birth year] (89 y.o. Arta Silence Primary Care Provider: Thora Lance Other Clinician: Referring Provider: Treating Provider/Extender: Bertrum Sol in Treatment: 0 Diagnosis Coding ICD-10 Codes Code Description L98.9 Disorder of the skin and  subcutaneous tissue, unspecified H93.8X2 Other specified disorders of left ear G35 Multiple sclerosis Z99.3 Dependence on wheelchair Facility Procedures : CPT4 Code: [id number] Description: 99213 - WOUND CARE VISIT-LEV 3 EST PT Modifier: Quantity: 1 Physician Procedures : CPT4 Code Description Modifier [id number] WC PHYS LEVEL 3 NEW PT ICD-10 Diagnosis Description L98.9 Disorder of the skin and subcutaneous tissue, unspecified H93.8X2 Other specified disorders of left ear G35 Multiple sclerosis Z99.3 Dependence on  wheelchair Quantity: 1 Electronic Signature(s) Signed: 07/21/2023 12:36:03 PM By: Geralyn Corwin DO Entered By: Geralyn Corwin on 07/21/2023 10:59:21

## 2023-07-21 NOTE — Progress Notes (Signed)
SATORI, GUIN ([id number]) [phone] [id number] Page 1 of 4 Visit Report for 07/21/2023 Abuse Risk Screen Details Patient Name: Date of Service: [name], [name] 07/21/2023 9:30 A M Medical Record Number: [id number] Patient Account Number: [id number] Date of Birth/Sex: Treating RN: 1932-08-21 (89 y.o. Arta Silence Primary Care : Thora Lance Other Clinician: Referring : Treating /Extender: Bertrum Sol in Treatment: 0 Abuse Risk Screen Items Answer ABUSE RISK SCREEN: Has anyone close to you tried to hurt or harm you recentlyo No Do you feel uncomfortable with anyone in your familyo No Has anyone forced you do things that you didnt want to doo No Electronic Signature(s) Signed: 07/21/2023 3:26:57 PM By: Gabriela Stall RN, BSN Entered By: [name] on 07/21/2023 09:37:29 -------------------------------------------------------------------------------- Activities of Daily Living Details Patient Name: Date of Service: Gabriela, Brown 07/21/2023 9:30 A M Medical Record Number: [id number] Patient Account Number: [id number] Date of Birth/Sex: Treating RN: 06-Feb-1932 (89 y.o. Arta Silence Primary Care : Thora Lance Other Clinician: Referring : Treating /Extender: Bertrum Sol in Treatment: 0 Activities of Daily Living Items Answer Activities of Daily Living (Please select one for each item) Drive Automobile Not Able T Medications ake Completely Able Use T elephone Completely Able Care for Appearance Need Assistance Use T oilet Not Able Bath / Shower Not Able Dress Self Not Able Feed Self Need Assistance Walk Not Able Get In / Out Bed Not Able Housework Not Able Prepare Meals Not Able Handle Money Not Able Shop for Self Not Able Electronic Signature(s) Signed: 07/21/2023 3:26:57 PM By: Gabriela Stall RN, BSN Entered By: [name] on 07/21/2023 09:38:23 [name] ([id number]) [id number] Page 2 of 4 -------------------------------------------------------------------------------- Education Screening Details Patient Name: Date of Service: Gabriela, Brown 07/21/2023 9:30 A M Medical Record Number: [id number] Patient Account Number: [id number] Date of Birth/Sex: Treating RN: 11/23/1932 (89 y.o. Debara Pickett, Millard.Loa Primary Care : Thora Lance Other Clinician: Referring : Treating /Extender: Bertrum Sol in Treatment: 0 Primary Learner Assessed: Patient Learning Preferences/Education Level/Primary Language Learning Preference: Explanation, Demonstration, Printed Material Highest Education Level: College or Above Preferred Language: English Cognitive Barrier Language Barrier: No Translator Needed: No Memory Deficit: No Emotional Barrier: No Cultural/Religious Beliefs Affecting Medical Care: No Physical Barrier Impaired Vision: No Impaired Hearing: No Decreased Hand dexterity: Yes Limitations: CVA Knowledge/Comprehension Knowledge Level: High Comprehension Level: High Ability to understand written instructions: High Ability to understand verbal instructions: High Motivation Anxiety Level: Calm Cooperation: Cooperative Education Importance: Acknowledges Need Interest in Health Problems: Asks Questions Perception: Coherent Willingness to Engage in Self-Management High Activities: Readiness to Engage in Self-Management High Activities: Electronic Signature(s) Signed: 07/21/2023 3:26:57 PM By: Gabriela Stall RN, BSN Entered By: [name] on 07/21/2023 09:39:10 -------------------------------------------------------------------------------- Fall Risk Assessment Details Patient Name: Date of Service: [name]. 07/21/2023 9:30 A M Medical Record Number: [id number] Patient Account Number:  [id number] Date of Birth/Sex: Treating RN: June 07, 1932 (89 y.o. Arta Silence Primary Care : Thora Lance Other Clinician: Referring : Treating /Extender: Bertrum Sol in Treatment: 0 Fall Risk Assessment Items Have you had 2 or more falls in the last 12 monthso 0 No SMITHA, CLAYWELL ([id number]) [phone] [id number] Page 3 of 4 Have you had any fall that resulted in injury in the last 12 monthso 0 No FALLS RISK SCREEN History of falling - immediate or within 3 months 0 No  Secondary diagnosis (Do you have 2 or more medical diagnoseso) 0 No Ambulatory aid None/bed rest/wheelchair/nurse 0 Yes Crutches/cane/walker 0 No Furniture 0 No Intravenous therapy Access/Saline/Heparin Lock 0 No Gait/Transferring Normal/ bed rest/ wheelchair 0 Yes Weak (short steps with or without shuffle, stooped but able to lift head while walking, may seek 0 No support from furniture) Impaired (short steps with shuffle, may have difficulty arising from chair, head down, impaired 0 No balance) Mental Status Oriented to own ability 0 Yes Electronic Signature(s) Signed: 07/21/2023 3:26:57 PM By: Gabriela Stall RN, BSN Entered By: [name] on 07/21/2023 09:40:17 -------------------------------------------------------------------------------- Foot Assessment Details Patient Name: Date of Service: [name]. 07/21/2023 9:30 A M Medical Record Number: [id number] Patient Account Number: [id number] Date of Birth/Sex: Treating RN: 1932-08-03 (89 y.o. Arta Silence Primary Care : Thora Lance Other Clinician: Referring : Treating /Extender: Bertrum Sol in Treatment: 0 Foot Assessment Items Site Locations + = Sensation present, - = Sensation absent, C = Callus, U = Ulcer R = Redness, W = Warmth, M = Maceration, PU = Pre-ulcerative lesion F = Fissure, S = Swelling,  D = Dryness Assessment Right: Left: Other Deformity: No No Prior Foot Ulcer: No No Prior Amputation: No No Charcot Joint: No No Ambulatory Status: Non-ambulatory Assistance Device: Wheelchair HOLLIDAY, CALLAWAY ([id number]) [phone] [id number] Page 4 of 4 Gait: Notes No BLE wounds. Electronic Signature(s) Signed: 07/21/2023 3:26:57 PM By: Gabriela Stall RN, BSN Entered By: [name] on 07/21/2023 09:41:02 -------------------------------------------------------------------------------- Nutrition Risk Screening Details Patient Name: Date of Service: [name], [name] 07/21/2023 9:30 A M Medical Record Number: [id number] Patient Account Number: [id number] Date of Birth/Sex: Treating RN: 1932-11-20 (89 y.o. Debara Pickett, Millard.Loa Primary Care : Thora Lance Other Clinician: Referring : Treating /Extender: Bertrum Sol in Treatment: 0 Height (in): 66 Weight (lbs): 100 Body Mass Index (BMI): 16.1 Nutrition Risk Screening Items Score Screening NUTRITION RISK SCREEN: I have an illness or condition that made me change the kind and/or amount of food I eat 2 Yes I eat fewer than two meals per day 0 No I eat few fruits and vegetables, or milk products 0 No I have three or more drinks of beer, liquor or wine almost every day 0 No I have tooth or mouth problems that make it hard for me to eat 0 No I don't always have enough money to buy the food I need 0 No I eat alone most of the time 0 No I take three or more different prescribed or over-the-counter drugs a day 1 Yes Without wanting to, I have lost or gained 10 pounds in the last six months 0 No I am not always physically able to shop, cook and/or feed myself 2 Yes Nutrition Protocols Good Risk Protocol Moderate Risk Protocol 0 Provide education on nutrition High Risk Proctocol Risk Level: Moderate Risk Score: 5 Electronic Signature(s) Signed: 07/21/2023  3:26:57 PM By: Gabriela Stall RN, BSN Entered By: [name] on 07/21/2023 09:40:27

## 2023-07-21 NOTE — Progress Notes (Signed)
[name] ([id number]) [id number] Page 1 of 6 Visit Report for 07/21/2023 Allergy List Details Patient Name: Date of Service: SHAWNDRIKA, [name] 07/21/2023 9:30 A M Medical Record Number: [id number] Patient Account Number: [id number] Date of Birth/Sex: Treating RN: 1932-06-24 (89 y.o. Arta Silence Primary Care : Thora Lance Other Clinician: Referring : Treating /Extender: Bertrum Sol in Treatment: 0 Allergies Active Allergies Sulfa (Sulfonamide Antibiotics) penicillin Allergy Notes Electronic Signature(s) Signed: 07/21/2023 3:26:57 PM By: [name] RN, BSN Entered By: [name] on 07/18/2023 14:31:43 -------------------------------------------------------------------------------- Arrival Information Details Patient Name: Date of Service: Gabriela Oris. 07/21/2023 9:30 A M Medical Record Number: [id number] Patient Account Number: [id number] Date of Birth/Sex: Treating RN: [birth year] (89 y.o. Arta Silence Primary Care : Thora Lance Other Clinician: Referring : Treating /Extender: Bertrum Sol in Treatment: 0 Visit Information Patient Arrived: Wheel Chair Arrival Time: 09:34 Accompanied By: daughter Transfer Assistance: None Patient Identification Verified: Yes Secondary Verification Process Completed: Yes Patient Requires Transmission-Based Precautions: No Patient Has Alerts: No Electronic Signature(s) Signed: 07/21/2023 3:26:57 PM By: [name] RN, BSN Entered By: [name] on 07/21/2023 09:34:43 Clinic Level of Care Assessment Details -------------------------------------------------------------------------------- [name] ([id number]) [id number] Page 2 of 6 Patient Name: Date of Service: [name], [name] 07/21/2023 9:30 A M Medical Record Number: [id number] Patient  Account Number: [id number] Date of Birth/Sex: Treating RN: 1932-04-07 (89 y.o. Arta Silence Primary Care : Thora Lance Other Clinician: Referring : Treating /Extender: Bertrum Sol in Treatment: 0 Clinic Level of Care Assessment Items TOOL 2 Quantity Score X- 1 0 Use when only an EandM is performed on the INITIAL visit ASSESSMENTS - Nursing Assessment / Reassessment X- 1 20 General Physical Exam (combine w/ comprehensive assessment (listed just below) when performed on new pt. evals) X- 1 25 Comprehensive Assessment (HX, ROS, Risk Assessments, Wounds Hx, etc.) ASSESSMENTS - Wound and Skin A ssessment / Reassessment []  - 0 Simple Wound Assessment / Reassessment - one wound []  - 0 Complex Wound Assessment / Reassessment - multiple wounds X- 1 10 Dermatologic / Skin Assessment (not related to wound area) ASSESSMENTS - Ostomy and/or Continence Assessment and Care []  - 0 Incontinence Assessment and Management []  - 0 Ostomy Care Assessment and Management (repouching, etc.) PROCESS - Coordination of Care X - Simple Patient / Family Education for ongoing care 1 15 []  - 0 Complex (extensive) Patient / Family Education for ongoing care X- 1 10 Staff obtains Chiropractor, Records, T Results / Process Orders est []  - 0 Staff telephones HHA, Nursing Homes / Clarify orders / etc []  - 0 Routine Transfer to another Facility (non-emergent condition) []  - 0 Routine Hospital Admission (non-emergent condition) []  - 0 New Admissions / Manufacturing engineer / Ordering NPWT Apligraf, etc. , []  - 0 Emergency Hospital Admission (emergent condition) X- 1 10 Simple Discharge Coordination []  - 0 Complex (extensive) Discharge Coordination PROCESS - Special Needs []  - 0 Pediatric / Minor Patient Management []  - 0 Isolation Patient Management []  - 0 Hearing / Language / Visual special needs []  - 0 Assessment of Community  assistance (transportation, D/C planning, etc.) []  - 0 Additional assistance / Altered mentation []  - 0 Support Surface(s) Assessment (bed, cushion, seat, etc.) INTERVENTIONS - Wound Cleansing / Measurement []  - 0 Wound Imaging (photographs - any number of wounds) []  - 0 Wound Tracing (instead of photographs) []  - 0 Simple Wound Measurement -  one wound []  - 0 Complex Wound Measurement - multiple wounds []  - 0 Simple Wound Cleansing - one wound []  - 0 Complex Wound Cleansing - multiple wounds INTERVENTIONS - Wound Dressings []  - 0 Small Wound Dressing one or multiple wounds []  - 0 Medium Wound Dressing one or multiple wounds []  - 0 Large Wound Dressing one or multiple wounds [name], [name] ([id number]) [id number] Page 3 of 6 []  - 0 Application of Medications - injection INTERVENTIONS - Miscellaneous []  - 0 External ear exam []  - 0 Specimen Collection (cultures, biopsies, blood, body fluids, etc.) []  - 0 Specimen(s) / Culture(s) sent or taken to Lab for analysis []  - 0 Patient Transfer (multiple staff / Michiel Sites Lift / Similar devices) []  - 0 Simple Staple / Suture removal (25 or less) []  - 0 Complex Staple / Suture removal (26 or more) []  - 0 Hypo / Hyperglycemic Management (close monitor of Blood Glucose) []  - 0 Ankle / Brachial Index (ABI) - do not check if billed separately Has the patient been seen at the hospital within the last three years: Yes Total Score: 90 Level Of Care: New/Established - Level 3 Electronic Signature(s) Signed: 07/21/2023 3:26:57 PM By: [name] RN, BSN Entered By: [name] on 07/21/2023 09:58:32 -------------------------------------------------------------------------------- Encounter Discharge Information Details Patient Name: Date of Service: Gabriela Oris. 07/21/2023 9:30 A M Medical Record Number: [id number] Patient Account Number: [id number] Date of Birth/Sex: Treating RN: September 22, 1932 (89 y.o. Arta Silence Primary Care : Thora Lance Other Clinician: Referring : Treating /Extender: Bertrum Sol in Treatment: 0 Encounter Discharge Information Items Discharge Condition: Stable Ambulatory Status: Wheelchair Discharge Destination: Home Transportation: Private Auto Accompanied By: daughter Schedule Follow-up Appointment: No Clinical Summary of Care: Electronic Signature(s) Signed: 07/21/2023 3:26:57 PM By: [name] RN, BSN Entered By: [name] on 07/21/2023 10:01:02 -------------------------------------------------------------------------------- Lower Extremity Assessment Details Patient Name: Date of Service: Gabriela Oris. 07/21/2023 9:30 A M Medical Record Number: [id number] Patient Account Number: [id number] Date of Birth/Sex: Treating RN: Feb 25, 1932 (89 y.o. Arta Silence Primary Care : Thora Lance Other Clinician: Referring : Treating /Extender: Bertrum Sol in Treatment: 0 Notes [name], [name] ([id number]) [id number] Page 4 of 6 No BLE wounds. Electronic Signature(s) Signed: 07/21/2023 3:26:57 PM By: [name] RN, BSN Entered By: [name] on 07/21/2023 09:41:13 -------------------------------------------------------------------------------- Multi Wound Chart Details Patient Name: Date of Service: Gabriela Oris. 07/21/2023 9:30 A M Medical Record Number: [id number] Patient Account Number: [id number] Date of Birth/Sex: Treating RN: 06-21-1932 (89 y.o. F) Primary Care : Thora Lance Other Clinician: Referring : Treating /Extender: Bertrum Sol in Treatment: 0 Vital Signs Height(in): 66 Pulse(bpm): 65 Weight(lbs): 100 Blood Pressure(mmHg): 103/59 Body Mass Index(BMI): 16.1 Temperature(F): 98.3 Respiratory Rate(breaths/min): 16 [Treatment  Notes:Wound Assessments Treatment Notes] Electronic Signature(s) Signed: 07/21/2023 12:36:03 PM By: Geralyn Corwin DO Entered By: Geralyn Corwin on 07/21/2023 10:24:16 -------------------------------------------------------------------------------- Multi-Disciplinary Care Plan Details Patient Name: Date of Service: Gabriela Oris. 07/21/2023 9:30 A M Medical Record Number: [id number] Patient Account Number: [id number] Date of Birth/Sex: Treating RN: May 12, 1932 (89 y.o. Arta Silence Primary Care : Thora Lance Other Clinician: Referring : Treating /Extender: Bertrum Sol in Treatment: 0 Active Inactive Electronic Signature(s) Signed: 07/21/2023 3:26:57 PM By: [name] RN, BSN Entered By: [name] on 07/21/2023 10:00:37 -------------------------------------------------------------------------------- Pain Assessment Details Patient Name: Date of Service: Gabriela Oris.  07/21/2023 9:30 A [name], [name] ([id number]) [id number] Page 5 of 6 Medical Record Number: [id number] Patient Account Number: [id number] Date of Birth/Sex: Treating RN: [birth year] (89 y.o. Arta Silence Primary Care : Thora Lance Other Clinician: Referring : Treating /Extender: Bertrum Sol in Treatment: 0 Active Problems Location of Pain Severity and Description of Pain Patient Has Paino Yes Site Locations Pain Location: Generalized Pain, Pain in Ulcers Rate the pain. Current Pain Level: 1 Pain Management and Medication Current Pain Management: Medication: No Cold Application: No Rest: No Massage: No Activity: No T.E.N.S.: No Heat Application: No Leg drop or elevation: No Is the Current Pain Management Adequate: Adequate How does your wound impact your activities of daily livingo Sleep: No Bathing: No Appetite: No Relationship With  Others: No Bladder Continence: No Emotions: No Bowel Continence: No Work: No Toileting: No Drive: No Dressing: No Hobbies: No Notes hip pain when laying on it. Per patient ear hurts first thing in the morning. Electronic Signature(s) Signed: 07/21/2023 3:26:57 PM By: [name] RN, BSN Entered By: [name] on 07/21/2023 09:39:53 -------------------------------------------------------------------------------- Patient/Caregiver Education Details Patient Name: Date of Service: [name], Gabriela NCY W. 8/12/2024andnbsp9:30 A M Medical Record Number: [id number] Patient Account Number: [id number] Date of Birth/Gender: Treating RN: [birth year] (89 y.o. Arta Silence Primary Care Physician: Thora Lance Other Clinician: Referring Physician: Treating Physician/Extender: Bertrum Sol in Treatment: 0 Education Assessment Education Provided To: Patient and Caregiver [name], [name] ([id number]) [id number] Page 6 of 6 Education Topics Provided Welcome [facility]-New [facility] Packet: o Handouts: Welcome [facility] o Methods: Explain/Verbal Responses: Reinforcements needed Electronic Signature(s) Signed: 07/21/2023 3:26:57 PM By: [name] RN, BSN Entered By: [name] on 07/21/2023 09:57:42 -------------------------------------------------------------------------------- Vitals Details Patient Name: Date of Service: Gabriela Oris. 07/21/2023 9:30 A M Medical Record Number: [id number] Patient Account Number: [id number] Date of Birth/Sex: Treating RN: [birth year] (89 y.o. Debara Pickett, Millard.Loa Primary Care : Thora Lance Other Clinician: Referring : Treating /Extender: Bertrum Sol in Treatment: 0 Vital Signs Time Taken: 09:33 Temperature (F): 98.3 Height (in): 66 Pulse (bpm): 65 Source: Stated Respiratory Rate (breaths/min): 16 Weight  (lbs): 100 Blood Pressure (mmHg): 103/59 Source: Stated Reference Range: 80 - 120 mg / dl Body Mass Index (BMI): 16.1 Airway Pulse Oximetry (%): 96 Electronic Signature(s) Signed: 07/21/2023 3:26:57 PM By: [name] RN, BSN Entered By: [name] on 07/21/2023 09:37:14

## 2023-07-25 NOTE — Progress Notes (Signed)
  Subjective:  Patient ID: [name], female    DOB: Sep 07, 1932,  MRN: [id number]  [name] presents to clinic today for at risk foot care with history of peripheral neuropathy and callus(es) left foot and painful thick toenails that are difficult to trim. Painful toenails interfere with ambulation. Aggravating factors include wearing enclosed shoe gear. Pain is relieved with periodic professional debridement. Painful calluses are aggravated when weightbearing with and without shoegear. Pain is relieved with periodic professional debridement.  Chief Complaint  Patient presents with   Nail Problem    RFC,Referring Provider Blair Heys, MD,LOV:08/24      PCP is Blair Heys, MD.  Allergies  Allergen Reactions   Sulfasalazine Anaphylaxis   Aspirin Nausea Only   Penicillins Diarrhea   Sulfa Antibiotics    Sulfamethoxazole-Trimethoprim Other (See Comments)    Review of Systems: Negative except as noted in the HPI.  Objective:  There were no vitals filed for this visit. [name] is a pleasant 89 y.o. female frail, in NAD. AAO x 3.  Vascular Examination:  Capillary refill time to digits immediate b/l. Palpable DP pulses b/l. Faintly palpable PT pulses b/l. Pedal hair absent b/l Skin temperature gradient within normal limits b/l. Dependent rubor noted b/l. +2 pitting edema b/l LE left >right.  Dermatological Examination: Pedal skin with normal turgor, texture and tone bilaterally. No open wounds bilaterally. No interdigital macerations bilaterally. Toenails 1-5 b/l elongated, discolored, dystrophic, thickened, crumbly with subungual debris and tenderness to dorsal palpation.   Incurvated nailplate bilateral great toes.  Nail border hypertrophy absent. There is tenderness to palpation. Sign(s) of infection: no clinical signs of infection noted on examination today..  She does small hyperkeratotic lesion left submetatarsal head 5 with no  erythema, no edema, no blistering. No purulence. Area is tender to palpation.  Musculoskeletal: Hammertoes noted to the 1-5 bilaterally. Utilizes motorized chair independently for mobility assistance.  Left lower extremity with fixed inversion foot deformity. Foot drop LLE. She has rubber egg crate type mat on foot plate of motorized chair for pressure relief.  Neurological: Protective sensation intact 5/5 intact bilaterally with 10g monofilament b/l. Diminished vibratory sensation. Involuntary spasms noted b/l feet.  Assessment/Plan: 1. Pain due to onychomycosis of toenails of both feet   2. Callus   3. Neuropathy     -Patient was evaluated and treated. All patient's and/or POA's questions/concerns answered on today's visit. -Continue padding for motorized chair foot plate to prevent pressure injury to plantar aspect left foot. Continue heel protector in bed daily to prevent pressure injury. -Patient to continue soft, supportive shoe gear daily. -Toenails 1-5 b/l were debrided in length and girth with sterile nail nippers and dremel without iatrogenic bleeding.  -No invasive procedure(s) performed. Offending nail border debrided and curretaged both nail borders of bilateral great toes utilizing sterile nail nipper and currette. Border(s) cleansed with alcohol and triple antibiotic ointment applied. Patient/POA/Caregiver/Facility instructed to apply Neosporin Cream  to left great toe and right great toe once daily for 7 days. Call office if there are any concerns. -Callus(es) submet head 5 left foot pared utilizing rotary bur without complication or incident. Total number pared =1. -Patient/POA to call should there be question/concern in the interim.   Return in about 3 months (around 10/15/2023).  Freddie Breech, DPM

## 2023-07-29 DIAGNOSIS — H43813 Vitreous degeneration, bilateral: Secondary | ICD-10-CM | POA: Diagnosis not present

## 2023-07-29 DIAGNOSIS — H353132 Nonexudative age-related macular degeneration, bilateral, intermediate dry stage: Secondary | ICD-10-CM | POA: Diagnosis not present

## 2023-07-29 DIAGNOSIS — H35371 Puckering of macula, right eye: Secondary | ICD-10-CM | POA: Diagnosis not present

## 2023-07-31 DIAGNOSIS — R54 Age-related physical debility: Secondary | ICD-10-CM | POA: Diagnosis not present

## 2023-07-31 DIAGNOSIS — I6930 Unspecified sequelae of cerebral infarction: Secondary | ICD-10-CM | POA: Diagnosis not present

## 2023-07-31 DIAGNOSIS — Z8673 Personal history of transient ischemic attack (TIA), and cerebral infarction without residual deficits: Secondary | ICD-10-CM | POA: Diagnosis not present

## 2023-07-31 DIAGNOSIS — G35 Multiple sclerosis: Secondary | ICD-10-CM | POA: Diagnosis not present

## 2023-07-31 DIAGNOSIS — M1990 Unspecified osteoarthritis, unspecified site: Secondary | ICD-10-CM | POA: Diagnosis not present

## 2023-08-02 DIAGNOSIS — E039 Hypothyroidism, unspecified: Secondary | ICD-10-CM | POA: Diagnosis not present

## 2023-08-02 DIAGNOSIS — M069 Rheumatoid arthritis, unspecified: Secondary | ICD-10-CM | POA: Diagnosis not present

## 2023-08-02 DIAGNOSIS — M130 Polyarthritis, unspecified: Secondary | ICD-10-CM | POA: Diagnosis not present

## 2023-08-02 DIAGNOSIS — I7121 Aneurysm of the ascending aorta, without rupture: Secondary | ICD-10-CM | POA: Diagnosis not present

## 2023-08-02 DIAGNOSIS — M316 Other giant cell arteritis: Secondary | ICD-10-CM | POA: Diagnosis not present

## 2023-08-02 DIAGNOSIS — I69351 Hemiplegia and hemiparesis following cerebral infarction affecting right dominant side: Secondary | ICD-10-CM | POA: Diagnosis not present

## 2023-08-02 DIAGNOSIS — I872 Venous insufficiency (chronic) (peripheral): Secondary | ICD-10-CM | POA: Diagnosis not present

## 2023-08-02 DIAGNOSIS — I110 Hypertensive heart disease with heart failure: Secondary | ICD-10-CM | POA: Diagnosis not present

## 2023-08-02 DIAGNOSIS — R1310 Dysphagia, unspecified: Secondary | ICD-10-CM | POA: Diagnosis not present

## 2023-08-02 DIAGNOSIS — Z993 Dependence on wheelchair: Secondary | ICD-10-CM | POA: Diagnosis not present

## 2023-08-02 DIAGNOSIS — I69354 Hemiplegia and hemiparesis following cerebral infarction affecting left non-dominant side: Secondary | ICD-10-CM | POA: Diagnosis not present

## 2023-08-02 DIAGNOSIS — G35 Multiple sclerosis: Secondary | ICD-10-CM | POA: Diagnosis not present

## 2023-08-02 DIAGNOSIS — I509 Heart failure, unspecified: Secondary | ICD-10-CM | POA: Diagnosis not present

## 2023-08-02 DIAGNOSIS — M810 Age-related osteoporosis without current pathological fracture: Secondary | ICD-10-CM | POA: Diagnosis not present

## 2023-08-05 DIAGNOSIS — M069 Rheumatoid arthritis, unspecified: Secondary | ICD-10-CM | POA: Diagnosis not present

## 2023-08-05 DIAGNOSIS — I110 Hypertensive heart disease with heart failure: Secondary | ICD-10-CM | POA: Diagnosis not present

## 2023-08-05 DIAGNOSIS — I69354 Hemiplegia and hemiparesis following cerebral infarction affecting left non-dominant side: Secondary | ICD-10-CM | POA: Diagnosis not present

## 2023-08-05 DIAGNOSIS — I509 Heart failure, unspecified: Secondary | ICD-10-CM | POA: Diagnosis not present

## 2023-08-05 DIAGNOSIS — G35 Multiple sclerosis: Secondary | ICD-10-CM | POA: Diagnosis not present

## 2023-08-05 DIAGNOSIS — I69351 Hemiplegia and hemiparesis following cerebral infarction affecting right dominant side: Secondary | ICD-10-CM | POA: Diagnosis not present

## 2023-08-14 DIAGNOSIS — I509 Heart failure, unspecified: Secondary | ICD-10-CM | POA: Diagnosis not present

## 2023-08-14 DIAGNOSIS — G35 Multiple sclerosis: Secondary | ICD-10-CM | POA: Diagnosis not present

## 2023-08-14 DIAGNOSIS — I69351 Hemiplegia and hemiparesis following cerebral infarction affecting right dominant side: Secondary | ICD-10-CM | POA: Diagnosis not present

## 2023-08-14 DIAGNOSIS — I110 Hypertensive heart disease with heart failure: Secondary | ICD-10-CM | POA: Diagnosis not present

## 2023-08-14 DIAGNOSIS — I69354 Hemiplegia and hemiparesis following cerebral infarction affecting left non-dominant side: Secondary | ICD-10-CM | POA: Diagnosis not present

## 2023-08-14 DIAGNOSIS — M069 Rheumatoid arthritis, unspecified: Secondary | ICD-10-CM | POA: Diagnosis not present

## 2023-08-15 DIAGNOSIS — I69351 Hemiplegia and hemiparesis following cerebral infarction affecting right dominant side: Secondary | ICD-10-CM | POA: Diagnosis not present

## 2023-08-15 DIAGNOSIS — I110 Hypertensive heart disease with heart failure: Secondary | ICD-10-CM | POA: Diagnosis not present

## 2023-08-15 DIAGNOSIS — I69354 Hemiplegia and hemiparesis following cerebral infarction affecting left non-dominant side: Secondary | ICD-10-CM | POA: Diagnosis not present

## 2023-08-15 DIAGNOSIS — I509 Heart failure, unspecified: Secondary | ICD-10-CM | POA: Diagnosis not present

## 2023-08-15 DIAGNOSIS — M069 Rheumatoid arthritis, unspecified: Secondary | ICD-10-CM | POA: Diagnosis not present

## 2023-08-15 DIAGNOSIS — G35 Multiple sclerosis: Secondary | ICD-10-CM | POA: Diagnosis not present

## 2023-08-20 DIAGNOSIS — I69354 Hemiplegia and hemiparesis following cerebral infarction affecting left non-dominant side: Secondary | ICD-10-CM | POA: Diagnosis not present

## 2023-08-20 DIAGNOSIS — M069 Rheumatoid arthritis, unspecified: Secondary | ICD-10-CM | POA: Diagnosis not present

## 2023-08-20 DIAGNOSIS — I69351 Hemiplegia and hemiparesis following cerebral infarction affecting right dominant side: Secondary | ICD-10-CM | POA: Diagnosis not present

## 2023-08-20 DIAGNOSIS — I110 Hypertensive heart disease with heart failure: Secondary | ICD-10-CM | POA: Diagnosis not present

## 2023-08-20 DIAGNOSIS — I509 Heart failure, unspecified: Secondary | ICD-10-CM | POA: Diagnosis not present

## 2023-08-20 DIAGNOSIS — G35 Multiple sclerosis: Secondary | ICD-10-CM | POA: Diagnosis not present

## 2023-08-29 DIAGNOSIS — I69351 Hemiplegia and hemiparesis following cerebral infarction affecting right dominant side: Secondary | ICD-10-CM | POA: Diagnosis not present

## 2023-08-29 DIAGNOSIS — M069 Rheumatoid arthritis, unspecified: Secondary | ICD-10-CM | POA: Diagnosis not present

## 2023-08-29 DIAGNOSIS — I509 Heart failure, unspecified: Secondary | ICD-10-CM | POA: Diagnosis not present

## 2023-08-29 DIAGNOSIS — G35 Multiple sclerosis: Secondary | ICD-10-CM | POA: Diagnosis not present

## 2023-08-29 DIAGNOSIS — I110 Hypertensive heart disease with heart failure: Secondary | ICD-10-CM | POA: Diagnosis not present

## 2023-08-29 DIAGNOSIS — I69354 Hemiplegia and hemiparesis following cerebral infarction affecting left non-dominant side: Secondary | ICD-10-CM | POA: Diagnosis not present

## 2023-09-01 DIAGNOSIS — M810 Age-related osteoporosis without current pathological fracture: Secondary | ICD-10-CM | POA: Diagnosis not present

## 2023-09-01 DIAGNOSIS — R1310 Dysphagia, unspecified: Secondary | ICD-10-CM | POA: Diagnosis not present

## 2023-09-01 DIAGNOSIS — I69351 Hemiplegia and hemiparesis following cerebral infarction affecting right dominant side: Secondary | ICD-10-CM | POA: Diagnosis not present

## 2023-09-01 DIAGNOSIS — I110 Hypertensive heart disease with heart failure: Secondary | ICD-10-CM | POA: Diagnosis not present

## 2023-09-01 DIAGNOSIS — I872 Venous insufficiency (chronic) (peripheral): Secondary | ICD-10-CM | POA: Diagnosis not present

## 2023-09-01 DIAGNOSIS — I7121 Aneurysm of the ascending aorta, without rupture: Secondary | ICD-10-CM | POA: Diagnosis not present

## 2023-09-01 DIAGNOSIS — M130 Polyarthritis, unspecified: Secondary | ICD-10-CM | POA: Diagnosis not present

## 2023-09-01 DIAGNOSIS — Z993 Dependence on wheelchair: Secondary | ICD-10-CM | POA: Diagnosis not present

## 2023-09-01 DIAGNOSIS — I509 Heart failure, unspecified: Secondary | ICD-10-CM | POA: Diagnosis not present

## 2023-09-01 DIAGNOSIS — I69354 Hemiplegia and hemiparesis following cerebral infarction affecting left non-dominant side: Secondary | ICD-10-CM | POA: Diagnosis not present

## 2023-09-01 DIAGNOSIS — M069 Rheumatoid arthritis, unspecified: Secondary | ICD-10-CM | POA: Diagnosis not present

## 2023-09-01 DIAGNOSIS — M316 Other giant cell arteritis: Secondary | ICD-10-CM | POA: Diagnosis not present

## 2023-09-01 DIAGNOSIS — G35 Multiple sclerosis: Secondary | ICD-10-CM | POA: Diagnosis not present

## 2023-09-01 DIAGNOSIS — E039 Hypothyroidism, unspecified: Secondary | ICD-10-CM | POA: Diagnosis not present

## 2023-09-04 DIAGNOSIS — I69354 Hemiplegia and hemiparesis following cerebral infarction affecting left non-dominant side: Secondary | ICD-10-CM | POA: Diagnosis not present

## 2023-09-04 DIAGNOSIS — I110 Hypertensive heart disease with heart failure: Secondary | ICD-10-CM | POA: Diagnosis not present

## 2023-09-04 DIAGNOSIS — M069 Rheumatoid arthritis, unspecified: Secondary | ICD-10-CM | POA: Diagnosis not present

## 2023-09-04 DIAGNOSIS — I509 Heart failure, unspecified: Secondary | ICD-10-CM | POA: Diagnosis not present

## 2023-09-04 DIAGNOSIS — I69351 Hemiplegia and hemiparesis following cerebral infarction affecting right dominant side: Secondary | ICD-10-CM | POA: Diagnosis not present

## 2023-09-04 DIAGNOSIS — G35 Multiple sclerosis: Secondary | ICD-10-CM | POA: Diagnosis not present

## 2023-09-10 DIAGNOSIS — I509 Heart failure, unspecified: Secondary | ICD-10-CM | POA: Diagnosis not present

## 2023-09-10 DIAGNOSIS — I69354 Hemiplegia and hemiparesis following cerebral infarction affecting left non-dominant side: Secondary | ICD-10-CM | POA: Diagnosis not present

## 2023-09-10 DIAGNOSIS — G35 Multiple sclerosis: Secondary | ICD-10-CM | POA: Diagnosis not present

## 2023-09-10 DIAGNOSIS — M069 Rheumatoid arthritis, unspecified: Secondary | ICD-10-CM | POA: Diagnosis not present

## 2023-09-10 DIAGNOSIS — I110 Hypertensive heart disease with heart failure: Secondary | ICD-10-CM | POA: Diagnosis not present

## 2023-09-10 DIAGNOSIS — I69351 Hemiplegia and hemiparesis following cerebral infarction affecting right dominant side: Secondary | ICD-10-CM | POA: Diagnosis not present

## 2023-09-15 DIAGNOSIS — I10 Essential (primary) hypertension: Secondary | ICD-10-CM | POA: Diagnosis not present

## 2023-09-15 DIAGNOSIS — M130 Polyarthritis, unspecified: Secondary | ICD-10-CM | POA: Diagnosis not present

## 2023-09-15 DIAGNOSIS — E7800 Pure hypercholesterolemia, unspecified: Secondary | ICD-10-CM | POA: Diagnosis not present

## 2023-09-15 DIAGNOSIS — I6930 Unspecified sequelae of cerebral infarction: Secondary | ICD-10-CM | POA: Diagnosis not present

## 2023-09-15 DIAGNOSIS — Z0000 Encounter for general adult medical examination without abnormal findings: Secondary | ICD-10-CM | POA: Diagnosis not present

## 2023-09-15 DIAGNOSIS — E039 Hypothyroidism, unspecified: Secondary | ICD-10-CM | POA: Diagnosis not present

## 2023-09-15 DIAGNOSIS — E46 Unspecified protein-calorie malnutrition: Secondary | ICD-10-CM | POA: Diagnosis not present

## 2023-09-16 DIAGNOSIS — I69351 Hemiplegia and hemiparesis following cerebral infarction affecting right dominant side: Secondary | ICD-10-CM | POA: Diagnosis not present

## 2023-09-16 DIAGNOSIS — I509 Heart failure, unspecified: Secondary | ICD-10-CM | POA: Diagnosis not present

## 2023-09-16 DIAGNOSIS — M069 Rheumatoid arthritis, unspecified: Secondary | ICD-10-CM | POA: Diagnosis not present

## 2023-09-16 DIAGNOSIS — I110 Hypertensive heart disease with heart failure: Secondary | ICD-10-CM | POA: Diagnosis not present

## 2023-09-16 DIAGNOSIS — I69354 Hemiplegia and hemiparesis following cerebral infarction affecting left non-dominant side: Secondary | ICD-10-CM | POA: Diagnosis not present

## 2023-09-16 DIAGNOSIS — G35 Multiple sclerosis: Secondary | ICD-10-CM | POA: Diagnosis not present

## 2023-09-23 DIAGNOSIS — I69354 Hemiplegia and hemiparesis following cerebral infarction affecting left non-dominant side: Secondary | ICD-10-CM | POA: Diagnosis not present

## 2023-09-23 DIAGNOSIS — G35 Multiple sclerosis: Secondary | ICD-10-CM | POA: Diagnosis not present

## 2023-09-23 DIAGNOSIS — I509 Heart failure, unspecified: Secondary | ICD-10-CM | POA: Diagnosis not present

## 2023-09-23 DIAGNOSIS — I69351 Hemiplegia and hemiparesis following cerebral infarction affecting right dominant side: Secondary | ICD-10-CM | POA: Diagnosis not present

## 2023-09-23 DIAGNOSIS — M069 Rheumatoid arthritis, unspecified: Secondary | ICD-10-CM | POA: Diagnosis not present

## 2023-09-23 DIAGNOSIS — I110 Hypertensive heart disease with heart failure: Secondary | ICD-10-CM | POA: Diagnosis not present

## 2023-09-30 DIAGNOSIS — I509 Heart failure, unspecified: Secondary | ICD-10-CM | POA: Diagnosis not present

## 2023-09-30 DIAGNOSIS — M069 Rheumatoid arthritis, unspecified: Secondary | ICD-10-CM | POA: Diagnosis not present

## 2023-09-30 DIAGNOSIS — I110 Hypertensive heart disease with heart failure: Secondary | ICD-10-CM | POA: Diagnosis not present

## 2023-09-30 DIAGNOSIS — I69354 Hemiplegia and hemiparesis following cerebral infarction affecting left non-dominant side: Secondary | ICD-10-CM | POA: Diagnosis not present

## 2023-09-30 DIAGNOSIS — I69351 Hemiplegia and hemiparesis following cerebral infarction affecting right dominant side: Secondary | ICD-10-CM | POA: Diagnosis not present

## 2023-09-30 DIAGNOSIS — G35 Multiple sclerosis: Secondary | ICD-10-CM | POA: Diagnosis not present

## 2023-10-16 DIAGNOSIS — H7291 Unspecified perforation of tympanic membrane, right ear: Secondary | ICD-10-CM | POA: Diagnosis not present

## 2023-10-16 DIAGNOSIS — H6041 Cholesteatoma of right external ear: Secondary | ICD-10-CM | POA: Diagnosis not present

## 2023-10-16 DIAGNOSIS — H903 Sensorineural hearing loss, bilateral: Secondary | ICD-10-CM | POA: Diagnosis not present

## 2023-10-16 DIAGNOSIS — H6123 Impacted cerumen, bilateral: Secondary | ICD-10-CM | POA: Diagnosis not present

## 2023-10-29 DIAGNOSIS — H43813 Vitreous degeneration, bilateral: Secondary | ICD-10-CM | POA: Diagnosis not present

## 2023-10-29 DIAGNOSIS — H35371 Puckering of macula, right eye: Secondary | ICD-10-CM | POA: Diagnosis not present

## 2023-10-29 DIAGNOSIS — H353132 Nonexudative age-related macular degeneration, bilateral, intermediate dry stage: Secondary | ICD-10-CM | POA: Diagnosis not present

## 2023-11-18 ENCOUNTER — Ambulatory Visit (INDEPENDENT_AMBULATORY_CARE_PROVIDER_SITE_OTHER): Payer: Medicare Other | Admitting: Podiatry

## 2023-11-18 ENCOUNTER — Encounter: Payer: Self-pay | Admitting: Podiatry

## 2023-11-18 DIAGNOSIS — M79675 Pain in left toe(s): Secondary | ICD-10-CM | POA: Diagnosis not present

## 2023-11-18 DIAGNOSIS — G629 Polyneuropathy, unspecified: Secondary | ICD-10-CM

## 2023-11-18 DIAGNOSIS — L84 Corns and callosities: Secondary | ICD-10-CM | POA: Diagnosis not present

## 2023-11-18 DIAGNOSIS — E113592 Type 2 diabetes mellitus with proliferative diabetic retinopathy without macular edema, left eye: Secondary | ICD-10-CM

## 2023-11-18 DIAGNOSIS — L89899 Pressure ulcer of other site, unspecified stage: Secondary | ICD-10-CM | POA: Diagnosis not present

## 2023-11-18 DIAGNOSIS — B351 Tinea unguium: Secondary | ICD-10-CM | POA: Diagnosis not present

## 2023-11-18 DIAGNOSIS — M79674 Pain in right toe(s): Secondary | ICD-10-CM

## 2023-11-24 ENCOUNTER — Other Ambulatory Visit: Payer: Self-pay | Admitting: Podiatry

## 2023-11-24 NOTE — Progress Notes (Signed)
Subjective:  Patient ID: [name], female    DOB: December 31, 1931,  MRN: [id number]  [name] presents to clinic today for preventative diabetic foot care and thick, elongated toenails of both feet which are tender when wearing enclosed shoe gear.  Her daughter is present during today's visit. She and her husband have moved back to [facility] from [place], [place]. Chief Complaint  Patient presents with   Routine Post Op    Patient states that they have been pretty good, patient last seen her pcp in October of this year .    New problem(s): None.   PCP is Gabriela Heys, MD (Inactive).  Allergies  Allergen Reactions   Sulfasalazine Anaphylaxis   Aspirin Nausea Only   Penicillins Diarrhea   Sulfa Antibiotics    Sulfamethoxazole-Trimethoprim Other (See Comments)    Review of Systems: Negative except as noted in the HPI.  Objective: No changes noted in today's physical examination. There were no vitals filed for this visit. [name] is a pleasant 89 y.o. female WD, WN in NAD. AAO x 3.  Diabetic foot exam was performed with the following findings:   Capillary refill time to digits immediate b/l. Palpable DP pulse(s) b/l LE. Faintly palpable PT pulse(s) b/l LE. Pedal hair absent. No pain with calf compression b/l. Dependent edema noted b/l LE.  Pedal integument with normal turgor, texture and tone BLE. No open wounds b/l LE. Toenails 1-5 b/l elongated, discolored, dystrophic, thickened, crumbly with subungual debris and tenderness to dorsal palpation. Hyperkeratotic lesion(s) submet head 5 left foot.  No erythema, no edema, no drainage, no fluctuance.  Pt has subjective symptoms of neuropathy. Protective sensation intact 5/5 intact bilaterally with 10g monofilament b/l.  Noted disuse atrophy bilaterally. Rubber egg crate padding on foot plate of motorized chair. Hammertoe deformity noted 1-5 b/l. Utilizes motorized chair for mobility assistance. Spastic  inversion of LLE.      Assessment/Plan: 1. Pain due to onychomycosis of toenails of both feet   2. Pressure injury of skin of left foot, unspecified injury stage   3. Callus   4. Neuropathy   5. Type 2 diabetes mellitus with left eye affected by proliferative retinopathy without macular edema, without long-term current use of insulin (HCC)    Orders Placed This Encounter  Procedures   DME Wheelchair electric    To [employer] [address] [place]. [place], [place] [address] [phone]  Please modify foot plate on motorized chair to include gel padding to prevent pressure sore plantar aspect of left foot. Rubber egg crate is insufficient to prevent pressure.   -Patient's family member present. All questions/concerns addressed on today's visit. -Consent given for treatment as described below: -Examined patient. -Recommend gel padding for motorized chair foot plate to prevent pressure on feet. Chair was purchased at [employer]. Order entered. -Patient to continue soft, supportive shoe gear daily. -Mycotic toenails 1-5 bilaterally were debrided in length and girth with sterile nail nippers and dremel without incident. -Callus(es) submet head 5 left foot pared utilizing rotary bur without complication or incident. Total number pared =1.Continue heel protector daily to prevent pressure injury. -Patient/POA to call should there be question/concern in the interim.   Return in about 3 months (around 02/16/2024).  Freddie Breech, DPM      [facility] LOCATION: [address]  [place], [place] [address]                   Office [phone]   [facility] LOCATION: [address] [place], [place] [address] Office [phone]

## 2023-11-25 ENCOUNTER — Telehealth: Payer: Self-pay

## 2023-11-25 NOTE — Telephone Encounter (Signed)
-----   Message from Freddie Breech sent at 11/24/2023  7:54 AM EST ----- Good morning! Can you please fax order for foot plate modification for motorized chair? It's under orders. [place] patient.  Thanks! Dr. Reece Agar.

## 2023-11-25 NOTE — Telephone Encounter (Signed)
Order for foot plate modification on motorized wheelchair faxed to [employer] Fax [phone]  Confirmation received

## 2023-12-30 DIAGNOSIS — H7291 Unspecified perforation of tympanic membrane, right ear: Secondary | ICD-10-CM | POA: Diagnosis not present

## 2023-12-30 DIAGNOSIS — H9113 Presbycusis, bilateral: Secondary | ICD-10-CM | POA: Diagnosis not present

## 2024-02-03 ENCOUNTER — Other Ambulatory Visit: Payer: Self-pay

## 2024-02-03 MED ORDER — BACLOFEN 10 MG PO TABS: ORAL_TABLET | ORAL | 1 refills | Status: DC

## 2024-02-05 DIAGNOSIS — Z8781 Personal history of (healed) traumatic fracture: Secondary | ICD-10-CM | POA: Diagnosis not present

## 2024-02-05 DIAGNOSIS — G35 Multiple sclerosis: Secondary | ICD-10-CM | POA: Diagnosis not present

## 2024-02-05 DIAGNOSIS — I10 Essential (primary) hypertension: Secondary | ICD-10-CM | POA: Diagnosis not present

## 2024-02-05 DIAGNOSIS — E7800 Pure hypercholesterolemia, unspecified: Secondary | ICD-10-CM | POA: Diagnosis not present

## 2024-02-05 DIAGNOSIS — Z9181 History of falling: Secondary | ICD-10-CM | POA: Diagnosis not present

## 2024-02-05 DIAGNOSIS — M810 Age-related osteoporosis without current pathological fracture: Secondary | ICD-10-CM | POA: Diagnosis not present

## 2024-02-05 DIAGNOSIS — M316 Other giant cell arteritis: Secondary | ICD-10-CM | POA: Diagnosis not present

## 2024-02-05 DIAGNOSIS — E039 Hypothyroidism, unspecified: Secondary | ICD-10-CM | POA: Diagnosis not present

## 2024-02-05 DIAGNOSIS — I69351 Hemiplegia and hemiparesis following cerebral infarction affecting right dominant side: Secondary | ICD-10-CM | POA: Diagnosis not present

## 2024-02-09 DIAGNOSIS — S91201A Unspecified open wound of right great toe with damage to nail, initial encounter: Secondary | ICD-10-CM | POA: Diagnosis not present

## 2024-02-13 DIAGNOSIS — Z5189 Encounter for other specified aftercare: Secondary | ICD-10-CM | POA: Diagnosis not present

## 2024-03-15 DIAGNOSIS — E782 Mixed hyperlipidemia: Secondary | ICD-10-CM | POA: Diagnosis not present

## 2024-03-15 DIAGNOSIS — I10 Essential (primary) hypertension: Secondary | ICD-10-CM | POA: Diagnosis not present

## 2024-03-15 DIAGNOSIS — E039 Hypothyroidism, unspecified: Secondary | ICD-10-CM | POA: Diagnosis not present

## 2024-03-23 ENCOUNTER — Encounter: Payer: Self-pay | Admitting: Podiatry

## 2024-03-23 ENCOUNTER — Ambulatory Visit (INDEPENDENT_AMBULATORY_CARE_PROVIDER_SITE_OTHER): Payer: PRIVATE HEALTH INSURANCE | Admitting: Podiatry

## 2024-03-23 VITALS — Ht 67.0 in | Wt 122.6 lb

## 2024-03-23 DIAGNOSIS — M79674 Pain in right toe(s): Secondary | ICD-10-CM

## 2024-03-23 DIAGNOSIS — B351 Tinea unguium: Secondary | ICD-10-CM

## 2024-03-23 DIAGNOSIS — E113592 Type 2 diabetes mellitus with proliferative diabetic retinopathy without macular edema, left eye: Secondary | ICD-10-CM

## 2024-03-23 DIAGNOSIS — S99921A Unspecified injury of right foot, initial encounter: Secondary | ICD-10-CM | POA: Diagnosis not present

## 2024-03-23 DIAGNOSIS — S99922A Unspecified injury of left foot, initial encounter: Secondary | ICD-10-CM

## 2024-03-23 DIAGNOSIS — M79675 Pain in left toe(s): Secondary | ICD-10-CM | POA: Diagnosis not present

## 2024-03-23 DIAGNOSIS — L601 Onycholysis: Secondary | ICD-10-CM | POA: Diagnosis not present

## 2024-03-23 MED ORDER — MUPIROCIN 2 % EX OINT
TOPICAL_OINTMENT | CUTANEOUS | 1 refills | Status: AC
Start: 2024-03-23 — End: ?

## 2024-03-23 NOTE — Progress Notes (Signed)
 Subjective:  Patient ID: [name], female    DOB: 1932/01/11,  MRN: [id number]  [name] presents to clinic today for preventative diabetic foot care and painful, discolored, thick toenails which interfere with daily activities  Chief Complaint  Patient presents with   Nail Problem    Pt is here for [name] PCP is Dr Manus Gunning and LOV was in October.   New problem(s): None. with chief concern of injury to left great toe, right great toe, and R 2nd toe. Injury occurred as a result of stubbing her toe in her motorized chair. She visited her PCP who removed right great toenail and patient was instructed to follow up with [place] for her left great toe. Patient states aggravating factor(s) is/are direct pressure.   Her daughter is present during today's visit. They would like to know how to prevent future injuries. Also would like to know where to get appropriate sized heel protectors  PCP is Gabriela Heys, MD (Inactive).  Allergies  Allergen Reactions   Sulfasalazine Anaphylaxis   Aspirin Nausea Only   Penicillins Diarrhea   Sulfa Antibiotics    Sulfamethoxazole-Trimethoprim Other (See Comments)    Review of Systems: Negative except as noted in the HPI.  Objective: No changes noted in today's physical examination. There were no vitals filed for this visit. [name] is a pleasant 89 y.o. female in NAD. AAO x 3. Sitting up in motorized chair.  Vascular Examination: Capillary refill time immediate b/l. Vascular status intact b/l with palpable pedal pulses. Pedal hair  b/l. No pain with calf compression b/l. Skin temperature gradient WNL b/l. No cyanosis or clubbing b/l. No ischemia or gangrene noted b/l. Dependent edema noted b/l LE.  Neurological Examination: Sensation grossly intact b/l with 10 gram monofilament. Vibratory sensation intact b/l.   Dermatological Examination: Pedal skin with normal turgor, texture and tone b/l.  No open wounds. No  interdigital macerations.   Toenails 3-5 b/l, left 2nd thick, discolored, elongated with subungual debris and pain on dorsal palpation.   Anonychia noted right great toe. Nailbed(s) epithelialized.  There is evidence of acute subungual hematoma of the left great toe. Nailplate is loose. There is tenderness to palpation. There is evidence of subacute subungual hematoma of the R 2nd toe. There is onycholysis of nailplate. There is no  tenderness to palpation.  Musculoskeletal Examination: Noted disuse atrophy bilaterally. Rubber egg crate padding on foot plate of motorized chair. Hammertoe deformity noted 1-5 b/l. Utilizes motorized chair for mobility assistance. Spastic inversion of LLE.  Radiographs: None  Last A1c:      Latest Ref Rng & Units 03/27/2023    4:31 AM  Hemoglobin A1C  Hemoglobin-A1c 4.8 - 5.6 % 5.2    Assessment/Plan: 1. Pain due to onychomycosis of toenails of both feet   2. Toe injury, left, initial encounter   3. Toe injury, right, initial encounter   4. Onycholysis of toenail   5. Type 2 diabetes mellitus with left eye affected by proliferative retinopathy without macular edema, without long-term current use of insulin (HCC)     Meds ordered this encounter  Medications   mupirocin ointment (BACTROBAN) 2 %    Sig: Apply to affected toe once daily until healed.    Dispense:  30 g    Refill:  1    -Patient's family member present. All questions/concerns addressed on today's visit. -Consent given for treatment as described below: -Examined patient. -Discussed new heel protectors and purchasing bariatric tread socks to  protect toes. -Toenails were debrided in length and girth 3-5 right foot and left second digit with sterile nail nippers and dremel without iatrogenic bleeding.  -Loose nailplate L hallux and R 2nd toe gently debrided en toto. Digital nailbed cleansed with alcohol. Triple antibiotic ointment applied to nailbed followed by light dressing.  Patient/Caregiver/POA instructed to cleanse both digits with dial antibacterial soap, dab dry and apply Mupirocin Ointment to left great toe and R 2nd toe once daily until healed. -Patient/POA to call should there be question/concern in the interim.   Return in about 3 months (around 06/22/2024).  [name], DPM       LOCATION: [address], [place] [address]                   Office [phone]   [facility] LOCATION: [address] [place], [place] [address] Office [phone]

## 2024-04-28 DIAGNOSIS — H35371 Puckering of macula, right eye: Secondary | ICD-10-CM | POA: Diagnosis not present

## 2024-04-28 DIAGNOSIS — H353132 Nonexudative age-related macular degeneration, bilateral, intermediate dry stage: Secondary | ICD-10-CM | POA: Diagnosis not present

## 2024-04-28 DIAGNOSIS — H43813 Vitreous degeneration, bilateral: Secondary | ICD-10-CM | POA: Diagnosis not present

## 2024-04-28 DIAGNOSIS — H35363 Drusen (degenerative) of macula, bilateral: Secondary | ICD-10-CM | POA: Diagnosis not present

## 2024-04-29 DIAGNOSIS — H7291 Unspecified perforation of tympanic membrane, right ear: Secondary | ICD-10-CM | POA: Diagnosis not present

## 2024-04-29 DIAGNOSIS — H6123 Impacted cerumen, bilateral: Secondary | ICD-10-CM | POA: Diagnosis not present

## 2024-04-29 DIAGNOSIS — Z974 Presence of external hearing-aid: Secondary | ICD-10-CM | POA: Diagnosis not present

## 2024-05-16 ENCOUNTER — Other Ambulatory Visit: Payer: Self-pay | Admitting: Neurology

## 2024-05-17 DIAGNOSIS — E039 Hypothyroidism, unspecified: Secondary | ICD-10-CM | POA: Diagnosis not present

## 2024-05-17 DIAGNOSIS — M316 Other giant cell arteritis: Secondary | ICD-10-CM | POA: Diagnosis not present

## 2024-05-17 DIAGNOSIS — Z9181 History of falling: Secondary | ICD-10-CM | POA: Diagnosis not present

## 2024-05-17 DIAGNOSIS — M810 Age-related osteoporosis without current pathological fracture: Secondary | ICD-10-CM | POA: Diagnosis not present

## 2024-05-17 DIAGNOSIS — Z8781 Personal history of (healed) traumatic fracture: Secondary | ICD-10-CM | POA: Diagnosis not present

## 2024-05-17 DIAGNOSIS — G35 Multiple sclerosis: Secondary | ICD-10-CM | POA: Diagnosis not present

## 2024-05-17 DIAGNOSIS — I10 Essential (primary) hypertension: Secondary | ICD-10-CM | POA: Diagnosis not present

## 2024-05-17 DIAGNOSIS — E7800 Pure hypercholesterolemia, unspecified: Secondary | ICD-10-CM | POA: Diagnosis not present

## 2024-05-17 DIAGNOSIS — I69351 Hemiplegia and hemiparesis following cerebral infarction affecting right dominant side: Secondary | ICD-10-CM | POA: Diagnosis not present

## 2024-05-18 NOTE — Telephone Encounter (Signed)
 Last seen on 05/14/23 Follow up scheduled on 05/19/24

## 2024-05-19 ENCOUNTER — Encounter: Payer: Self-pay | Admitting: Neurology

## 2024-05-19 ENCOUNTER — Ambulatory Visit (INDEPENDENT_AMBULATORY_CARE_PROVIDER_SITE_OTHER): Payer: Medicare Other | Admitting: Neurology

## 2024-05-19 VITALS — BP 106/58 | HR 67

## 2024-05-19 DIAGNOSIS — G35 Multiple sclerosis: Secondary | ICD-10-CM

## 2024-05-19 DIAGNOSIS — R39198 Other difficulties with micturition: Secondary | ICD-10-CM

## 2024-05-19 DIAGNOSIS — R29898 Other symptoms and signs involving the musculoskeletal system: Secondary | ICD-10-CM | POA: Diagnosis not present

## 2024-05-19 DIAGNOSIS — I6381 Other cerebral infarction due to occlusion or stenosis of small artery: Secondary | ICD-10-CM | POA: Diagnosis not present

## 2024-05-19 DIAGNOSIS — Z8781 Personal history of (healed) traumatic fracture: Secondary | ICD-10-CM | POA: Diagnosis not present

## 2024-05-19 DIAGNOSIS — Z993 Dependence on wheelchair: Secondary | ICD-10-CM | POA: Diagnosis not present

## 2024-05-19 MED ORDER — BACLOFEN 10 MG PO TABS: ORAL_TABLET | ORAL | 3 refills | Status: DC

## 2024-05-19 NOTE — Progress Notes (Signed)
 [facility]  PATIENT: [name] DOB: 07/24/1932  REFERRING DOCTOR OR PCP:  Jannelle Memory SOURCE:   Patient, notes from Dr. Euel Herring and Dr. Juli Oas, imaging results, MRI images on PACS.  _________________________________   HISTORICAL  CHIEF COMPLAINT:  Chief Complaint  Patient presents with   Follow-up    Pt in room 11. Daughter in room. Here for MS follow up. Pt reports she feel fatigue, motor skills declined. Weakness in hands.  Pt c/o bilateral feet numbness. Pt is not able to tell when she has to use bathroom.    HISTORY OF PRESENT ILLNESS:  [name] is an 89 year old woman with probable primary progressive multiple sclerosis.   Update 05/19/2024: She has PPMS.  We discussed that the only FDA approved medication is Ocrevus.  However, the risks of Ocrevus would be high at her age and it is unlikely to provide much benefit.     She had a hemorrahgic stroke 03/25/2024 that led legd to near complete right hemiplegia and speech issues.    She was found to have a left thalamic hemorrhage.   BP was elevated.   Strength improved though she is still weak and RAM is slow on the right arm.   She is living at home with 24/7 caregiver.   She improved quite a bit after the hemorrhagic stroke.  However, since the last visit she has not had any further improvement and the daughter thinks she may be minimally worse with strength.  Blood pressure was elevated at the time of the stroke but she is now just on losartan  50 mg and pressure is low normal  She was doing PT/OT and ST at the house.  She has a caregiver/nurses aide but they have not been able to do much therapy type exercises.  We discussed restarting occupational therapy.  She had some dysphagia after the stroke that is better but not to baseline.    She broke her hip with a fall in 2021.Aaron Aas  She reports due to the nature of the fracture (closed right acetabular fx with superimposes humeral head fx. ), she  could not get a hip replacement.   She did not heal well and is still wheelchair bound as she can not bear weight on the right.   . She cannot transfer independently   She has leg spasticity, worse with sleep.  Baclofen  helps spasticity but it makes her sleepy so will no increase further.     She has more urinary incontinence since the hemorrhagic stroke  She had been diagnosed with RA in the past but diagnosis was questioned a couple years ago buy another rheumatologist.     Now off all RA DMT's.  She was last on Kevzara  (MOA is anti-IL6).  Previously was on MTX/leflunomide   Stroke history She had a stroke in 2022 (right basal ganglia) and lost some  use of her left side.   She is doing PT and OT.  She was already wheelchair bound since a right hip fracture in 2021.    She had a hemorrhagic stroke of left thalamus in April 2024 with severe right sided weakness that improved.       MS History:    She has had difficulty with her gait for many years. Her left leg seemed clumsy since at least 2000.   Her left toes would drag at times and her balance was poor.    About 2011, she fell at church and broke her pelvis tripping over  a gown.   Her gait continues to worsen.   Her right leg does well.   Her left arm is slightly clumsy and slightly weaker compared to her right.     All of her changes have been gradual.     She denies any numbness.   In 2014, she had MRIs showing many T2/FLAIR hyperintense foci in the brain. At her age,  the pattern was felt to be nonspecific. However, MRI of the cervical and thoracic spine showed a plaque adjacent to C2 to the right and a plaque adjacent to T6 to the left  IMAGING: MRIs of her brain and spine in 11/11/2013 were reviewed.   The MRI of the cervical spine shows a focus to the right at C2 and another focus to the left at C6. It also shows multilevel degenerative changes with anterolisthesis of C4 upon C5 but no nerve root compression.  No spinal cord compression.  The  MRI of the thoracic spine shows a focus to the left adjacent to T6. It also showed a chronic T9 compression fracture (she was never aware of this).  MRI of the brain shows many T2/FLAIR hyperintense foci.  Foci are non-specific though many of the foci are radially oriented to the ventricles in the periventricular white matter. Some foci in the pons have an appearance more typical for chronic microvascular ischemic change. MRI 2014 showed a chronic compression fracture at T9 and she can not think of no other episode of significant back pain.  CT scan of the head 03/26/2023 shows a left thalamic hemorrhage with surrounding edema.  Elsewhere the hemispheres are hypodensities consistent with demyelination and/or chronic microvascular ischemic change.  MRI of the head 03/26/2023 shows the thalamic hemorrhage with surrounding edema.  There are scattered T2/FLAIR hyperintense foci in the left thalamus (where she had an old stroke) and in the hemispheres.  FH: Her one paternal first cousin and 2 maternal cousins have MS.    She does not know whether they had PPMS or RRMS.    REVIEW OF SYSTEMS: Constitutional: No fevers, chills, sweats, or change in appetite.   Some fatigue.   Occ insomnia (sleep maintenance).    Pedal edema.   Eyes: No visual changes, double vision, eye pain Ear, nose and throat: No hearing loss, ear pain, nasal congestion, sore throat Cardiovascular: No chest pain, palpitations.   Echo shows grade 2 diastolic dysfunction.   Respiratory:  No shortness of breath at rest or with exertion.   No wheezes.    GastrointestinaI: No nausea, vomiting, diarrhea, abdominal pain, fecal incontinence Genitourinary:  see above.  .    Musculoskeletal:  No current neck pain, back pain Integumentary: No rash, pruritus, skin lesions Neurological: as above Psychiatric: Mild depression but no anxiety Endocrine: No palpitations, diaphoresis, change in appetite, change in weigh or increased  thirst Hematologic/Lymphatic:  No anemia, purpura, petechiae. Allergic/Immunologic: No itchy/runny eyes, nasal congestion, recent allergic reactions, rashes  ALLERGIES: Allergies  Allergen Reactions   Sulfasalazine Anaphylaxis   Aspirin  Nausea Only   Penicillins Diarrhea   Sulfa Antibiotics    Sulfamethoxazole-Trimethoprim Other (See Comments)    HOME MEDICATIONS:  Current Outpatient Medications:    acetaminophen  (TYLENOL ) 325 MG tablet, Take 2 tablets (650 mg total) by mouth every 6 (six) hours as needed for mild pain (or temp > 37.5 C (99.5 F))., Disp: , Rfl:    amLODipine  (NORVASC ) 10 MG tablet, Take 1 tablet (10 mg total) by mouth daily., Disp: 30 tablet, Rfl: 3  atorvastatin  (LIPITOR ) 80 MG tablet, Take 1 tablet (80 mg total) by mouth daily., Disp: 30 tablet, Rfl: 1   Calcium  Carb-Cholecalciferol  (CALCIUM -VITAMIN D3) 250-125 MG-UNIT TABS, Take 1 tablet by mouth daily., Disp: , Rfl:    Carboxymethylcellul-Glycerin  (LUBRICATING EYE DROPS OP), Place 1 drop into both eyes 2 (two) times daily. For Eye Dryness, Disp: , Rfl:    Ergocalciferol  (VITAMIN D2) 50 MCG (2000 UT) TABS, Take by mouth., Disp: , Rfl:    levothyroxine  (SYNTHROID ) 25 MCG tablet, Take 1 tablet (25 mcg total) by mouth daily., Disp: 30 tablet, Rfl: 0   losartan  (COZAAR ) 50 MG tablet, Take 1 tablet (50 mg total) by mouth daily., Disp: 30 tablet, Rfl: 3   Magnesium  250 MG TABS, Take 1 tablet by mouth daily., Disp: , Rfl:    Multiple Vitamins-Minerals (PRESERVISION AREDS 2 PO), Take by mouth. 1 units BID, Disp: , Rfl:    mupirocin  ointment (BACTROBAN ) 2 %, Apply to affected toe once daily until healed., Disp: 30 g, Rfl: 1   nabumetone  (RELAFEN ) 500 MG tablet, Take 1 tablet (500 mg total) by mouth 2 (two) times daily as needed., Disp: 60 tablet, Rfl: 0   baclofen  (LIORESAL ) 10 MG tablet, One po qAM,  and 2 po qHS, Disp: 270 tablet, Rfl: 3   mupirocin  ointment (BACTROBAN ) 2 %, Apply to left foot ulcer once daily, Disp: 22 g,  Rfl: 0   Vitamin D , Ergocalciferol , (DRISDOL ) 1.25 MG (50000 UNIT) CAPS capsule, Take 50,000 Units by mouth every 7 (seven) days. (Patient not taking: Reported on 05/19/2024), Disp: , Rfl:   PAST MEDICAL HISTORY: Past Medical History:  Diagnosis Date   Chronic venous insufficiency of lower extremity 01/25/2019   With chronic bilateral lower extremity edema   Incontinent of urine    Multiple sclerosis (HCC) 2015   Osteoporosis    osteopenia   Polyarthritis    Post-menopausal    Rheumatoid arthritis (HCC)    Temporal arteritis (HCC) 2011    PAST SURGICAL HISTORY: Past Surgical History:  Procedure Laterality Date   ABDOMINAL HYSTERECTOMY  1971   TVH   breast ductectomy Right 05/1995   COLONOSCOPY  10/2011    FAMILY HISTORY: Family History  Problem Relation Age of Onset   Osteoarthritis Father    Stroke Father    Heart failure Mother    Hypertension Mother    Heart disease Mother    Multiple sclerosis Cousin    Multiple sclerosis Cousin    Multiple sclerosis Cousin     SOCIAL HISTORY:  Social History   Socioeconomic History   Marital status: Widowed    Spouse name: Not on file   Number of children: 2   Years of education: Not on file   Highest education level: Not on file  Occupational History   Not on file  Tobacco Use   Smoking status: Never   Smokeless tobacco: Never  Vaping Use   Vaping status: Never Used  Substance and Sexual Activity   Alcohol  use: No   Drug use: No   Sexual activity: Not Currently    Partners: Male    Birth control/protection: Post-menopausal, Surgical    Comment: hysterectomy  Other Topics Concern   Not on file  Social History Narrative   Widowed mother of 2, grandmother 5 with 2 great-grandchildren.      Tries to workout at the [facility] several days a week 30 minutes at a time on stairstepper or elliptical.   Social Drivers of Health  Financial Resource Strain: Not on file  Food Insecurity: Low Risk  (10/16/2023)   Received  from [facility]   Hunger Vital Sign    Worried About Running Out of Food in the Last Year: Never true    Ran Out of Food in the Last Year: Never true  Transportation Needs: No Transportation Needs (10/16/2023)   Received from [facility]    In the past 12 months, has lack of reliable transportation kept you from medical appointments, meetings, work or from getting things needed for daily living? : No  Physical Activity: Not on file  Stress: Not on file  Social Connections: Not on file  Intimate Partner Violence: Not on file     PHYSICAL EXAM  Vitals:   05/19/24 1320  BP: (!) 106/58  Pulse: 67     There is no height or weight on file to calculate BMI.   General: The patient is well-developed and well-nourished and in no acute distress   Skin: Extremities show severe bilateral pitting pedal edema.   Neurologic Exam  Mental status: The patient is alert and oriented x 3 at the time of the examination. The patient has apparent normal recent and remote memory, with an apparently normal attention span and concentration ability.   Speech is normal.  Cranial nerves: Extraocular movements are full. The facial strength and facial sensation are normal. Trapezius strength is normal.  The tongue is midline, and the patient has symmetric elevation of the soft palate.  Voice is slurred but understandable.  No obvious hearing deficits are noted.  Motor:  Muscle bulk is normal.   Tone is increased in the legs, left > right.   Strength is 4/5 in the right arm and 4+/5 in the left arm and she has reduced left RAM).  Strength is 3 to 4-/5 in the right leg and 4 -/5 in left leg.  Rapid altering movements are reduced in the right hand. Sensory: Sensory testing shows reduced touch and vibration on her right leg relative to the left but reduced right hand vibration and temperature sensations on the left leg worse than left arm.  Reduced sensation to vibration at the  toes.  Coordination: Cerebellar testing shows good finger-nose-finger but reduced left heel-to-shin.  Gait and station: Station is normal.   She is unable to walk . Romberg is negative.  Reflexes: Deep tendon reflexes are symmetric and normal bilaterally.       DIAGNOSTIC DATA (LABS, IMAGING, TESTING) - I reviewed patient records, labs, notes, testing and imaging myself where available.  Lab Results  Component Value Date   WBC 5.6 03/28/2023   HGB 12.5 03/28/2023   HCT 35.7 (L) 03/28/2023   MCV 91.8 03/28/2023   PLT 201 03/28/2023      Component Value Date/Time   NA 135 03/28/2023 0350   NA 135 (A) 06/05/2020 0000   K 3.9 03/28/2023 0350   CL 105 03/28/2023 0350   CO2 24 03/28/2023 0350   GLUCOSE 99 03/28/2023 0350   BUN 11 03/28/2023 0350   BUN 17 06/05/2020 0000   CREATININE 0.58 03/28/2023 0350   CREATININE 0.82 02/23/2019 1444   CALCIUM  8.9 03/28/2023 0350   PROT 6.2 (L) 03/28/2023 0350   ALBUMIN 3.0 (L) 03/28/2023 0350   AST 27 03/28/2023 0350   AST 27 02/23/2019 1444   ALT 20 03/28/2023 0350   ALT 27 02/23/2019 1444   ALKPHOS 73 03/28/2023 0350   BILITOT 0.7 03/28/2023 0350  BILITOT 0.4 02/23/2019 1444   GFRNONAA >60 03/28/2023 0350   GFRNONAA >60 02/23/2019 1444   GFRAA >90 06/05/2020 0000   GFRAA >60 02/23/2019 1444       ASSESSMENT AND PLAN  Stroke of right basal ganglia (HCC) - Plan: Ambulatory referral to Occupational Therapy, CANCELED: Ambulatory referral to Occupational Therapy  Multiple sclerosis, primary progressive (HCC) - Plan: Ambulatory referral to Occupational Therapy, CANCELED: Ambulatory referral to Occupational Therapy  Right arm weakness - Plan: Ambulatory referral to Occupational Therapy, CANCELED: Ambulatory referral to Occupational Therapy  Wheelchair dependent  Urinary dysfunction  S/P right hip fracture   1.   She is fairly stable compared to her previous examination a year ago.  Unfortunately, she continues to have  weakness, right greater than left.  Speech is greatly improved but she still occasionally has dysphagia.  We discussed restarting occupational therapy.   2.    She will continue off of a DMT.  She appears to have PPMS though I can't rule out inactive SPMS.  Progression has been very slow and risks of Ocrevus would outweigh the benefits.   A second opinion rheumatologist felt that she did not have RA.  Now off all RA DMT'sShe was on Kevzara  (MOA is anti-IL6).  Previously was on MTX/leflunomide  3.   Stay active as possible.  Use wheelchair for safety.   Increase activity as tolerated 4.   She takes baclofen  10 - 10 - 20 mg over the day.  Sometimes she just takes 1 pill at night.    5.   rtc 12 months or sooner if new or worsening neurologic issues.   Can alternate visits with NP.   40 minutes office visit with the majority of the time spent face-to-face for history and physical, discussion/counseling and decision-making.  Additional time with record review and documentatio  This visit is part of a comprehensive longitudinal care medical relationship regarding the patients primary diagnosis of multiple sclerosis and stroke and related concerns.Sherida Dimmer. Godwin Lat, MD, PhD, Terral Ferrari   05/19/2024, 2:05 PM Director of the MS Center at [facility] Certified in Neurology, Clinical Neurophysiology, Sleep Medicine, Pain Medicine and Neuroimaging  [facility] [address], [address] [place], [place] [address] [phone]

## 2024-05-20 ENCOUNTER — Telehealth: Payer: Self-pay | Admitting: Neurology

## 2024-05-20 DIAGNOSIS — R29898 Other symptoms and signs involving the musculoskeletal system: Secondary | ICD-10-CM

## 2024-05-20 NOTE — Telephone Encounter (Signed)
 Contacted [facility] to get fax number to fax referral for occupational therapy. [facility] said they do not have occupational therapy there. Contacted patient's daughter, Levert Ready to get the information of the agency to fax referral. Ms. Isidoro Margarita stated have therapist, Amalia Badder personal number, will call her to get the name of the agency, phone number, fax number.

## 2024-05-20 NOTE — Telephone Encounter (Signed)
 Ms Fair call back with name of agency, phone number, fax number: [facility], phone number: [phone], Fax: [phone]. Ms Isidoro Margarita spoke with therapist Amalia Badder and she informed OT does not stand alone. Medicare requirement must have another discipline has to be ordered; PT, SP, Nursing.

## 2024-05-24 ENCOUNTER — Encounter: Payer: Self-pay | Admitting: Neurology

## 2024-05-24 ENCOUNTER — Telehealth: Payer: Self-pay | Admitting: Neurology

## 2024-05-24 NOTE — Addendum Note (Signed)
 Addended by: Fredi January on: 05/24/2024 07:41 AM   Modules accepted: Orders

## 2024-05-24 NOTE — Telephone Encounter (Signed)
 PT referral placed.

## 2024-05-24 NOTE — Telephone Encounter (Signed)
 Referral for occupational therapy fax to [facility]. Phone: [phone], Fax: [phone]

## 2024-05-24 NOTE — Telephone Encounter (Signed)
 Error

## 2024-05-24 NOTE — Telephone Encounter (Signed)
 Referral for Physical Therapy faxed to [facility]  Phone# [phone]  Fax#[phone]

## 2024-05-25 ENCOUNTER — Telehealth: Payer: Self-pay | Admitting: Neurology

## 2024-05-25 DIAGNOSIS — I110 Hypertensive heart disease with heart failure: Secondary | ICD-10-CM | POA: Diagnosis not present

## 2024-05-25 DIAGNOSIS — R1310 Dysphagia, unspecified: Secondary | ICD-10-CM | POA: Diagnosis not present

## 2024-05-25 DIAGNOSIS — G35 Multiple sclerosis: Secondary | ICD-10-CM | POA: Diagnosis not present

## 2024-05-25 DIAGNOSIS — E7800 Pure hypercholesterolemia, unspecified: Secondary | ICD-10-CM | POA: Diagnosis not present

## 2024-05-25 DIAGNOSIS — I2510 Atherosclerotic heart disease of native coronary artery without angina pectoris: Secondary | ICD-10-CM | POA: Diagnosis not present

## 2024-05-25 DIAGNOSIS — Z791 Long term (current) use of non-steroidal anti-inflammatories (NSAID): Secondary | ICD-10-CM | POA: Diagnosis not present

## 2024-05-25 DIAGNOSIS — Z90710 Acquired absence of both cervix and uterus: Secondary | ICD-10-CM | POA: Diagnosis not present

## 2024-05-25 DIAGNOSIS — I509 Heart failure, unspecified: Secondary | ICD-10-CM | POA: Diagnosis not present

## 2024-05-25 DIAGNOSIS — I872 Venous insufficiency (chronic) (peripheral): Secondary | ICD-10-CM | POA: Diagnosis not present

## 2024-05-25 DIAGNOSIS — M316 Other giant cell arteritis: Secondary | ICD-10-CM | POA: Diagnosis not present

## 2024-05-25 DIAGNOSIS — E039 Hypothyroidism, unspecified: Secondary | ICD-10-CM | POA: Diagnosis not present

## 2024-05-25 DIAGNOSIS — M21372 Foot drop, left foot: Secondary | ICD-10-CM | POA: Diagnosis not present

## 2024-05-25 DIAGNOSIS — E46 Unspecified protein-calorie malnutrition: Secondary | ICD-10-CM | POA: Diagnosis not present

## 2024-05-25 DIAGNOSIS — Z604 Social exclusion and rejection: Secondary | ICD-10-CM | POA: Diagnosis not present

## 2024-05-25 DIAGNOSIS — H7200 Central perforation of tympanic membrane, unspecified ear: Secondary | ICD-10-CM | POA: Diagnosis not present

## 2024-05-25 DIAGNOSIS — Z993 Dependence on wheelchair: Secondary | ICD-10-CM | POA: Diagnosis not present

## 2024-05-25 DIAGNOSIS — I69351 Hemiplegia and hemiparesis following cerebral infarction affecting right dominant side: Secondary | ICD-10-CM | POA: Diagnosis not present

## 2024-05-25 DIAGNOSIS — M810 Age-related osteoporosis without current pathological fracture: Secondary | ICD-10-CM | POA: Diagnosis not present

## 2024-05-25 DIAGNOSIS — H6123 Impacted cerumen, bilateral: Secondary | ICD-10-CM | POA: Diagnosis not present

## 2024-05-25 DIAGNOSIS — H6041 Cholesteatoma of right external ear: Secondary | ICD-10-CM | POA: Diagnosis not present

## 2024-05-25 DIAGNOSIS — M130 Polyarthritis, unspecified: Secondary | ICD-10-CM | POA: Diagnosis not present

## 2024-05-25 DIAGNOSIS — D472 Monoclonal gammopathy: Secondary | ICD-10-CM | POA: Diagnosis not present

## 2024-05-25 DIAGNOSIS — H903 Sensorineural hearing loss, bilateral: Secondary | ICD-10-CM | POA: Diagnosis not present

## 2024-05-25 DIAGNOSIS — E113592 Type 2 diabetes mellitus with proliferative diabetic retinopathy without macular edema, left eye: Secondary | ICD-10-CM | POA: Diagnosis not present

## 2024-05-25 DIAGNOSIS — Z8781 Personal history of (healed) traumatic fracture: Secondary | ICD-10-CM | POA: Diagnosis not present

## 2024-05-25 NOTE — Telephone Encounter (Signed)
 Called [phone]. LVM providing VO for PT 1 times per week for 4 wk and evaluation for OT.Aaron Aas Asked him to call back if any further questions.

## 2024-05-25 NOTE — Telephone Encounter (Signed)
 PT [name] w/ [facility] has called for verbal orders for PT for 1 week 4, he is also asking for an evaluation for OT

## 2024-05-28 DIAGNOSIS — M316 Other giant cell arteritis: Secondary | ICD-10-CM | POA: Diagnosis not present

## 2024-05-28 DIAGNOSIS — G35 Multiple sclerosis: Secondary | ICD-10-CM | POA: Diagnosis not present

## 2024-05-28 DIAGNOSIS — I69351 Hemiplegia and hemiparesis following cerebral infarction affecting right dominant side: Secondary | ICD-10-CM | POA: Diagnosis not present

## 2024-05-28 DIAGNOSIS — I110 Hypertensive heart disease with heart failure: Secondary | ICD-10-CM | POA: Diagnosis not present

## 2024-05-28 DIAGNOSIS — E113592 Type 2 diabetes mellitus with proliferative diabetic retinopathy without macular edema, left eye: Secondary | ICD-10-CM | POA: Diagnosis not present

## 2024-05-28 DIAGNOSIS — I509 Heart failure, unspecified: Secondary | ICD-10-CM | POA: Diagnosis not present

## 2024-05-31 DIAGNOSIS — G35 Multiple sclerosis: Secondary | ICD-10-CM | POA: Diagnosis not present

## 2024-05-31 DIAGNOSIS — E113592 Type 2 diabetes mellitus with proliferative diabetic retinopathy without macular edema, left eye: Secondary | ICD-10-CM | POA: Diagnosis not present

## 2024-05-31 DIAGNOSIS — I509 Heart failure, unspecified: Secondary | ICD-10-CM | POA: Diagnosis not present

## 2024-05-31 DIAGNOSIS — I69351 Hemiplegia and hemiparesis following cerebral infarction affecting right dominant side: Secondary | ICD-10-CM | POA: Diagnosis not present

## 2024-05-31 DIAGNOSIS — M316 Other giant cell arteritis: Secondary | ICD-10-CM | POA: Diagnosis not present

## 2024-05-31 DIAGNOSIS — I110 Hypertensive heart disease with heart failure: Secondary | ICD-10-CM | POA: Diagnosis not present

## 2024-06-04 DIAGNOSIS — E113592 Type 2 diabetes mellitus with proliferative diabetic retinopathy without macular edema, left eye: Secondary | ICD-10-CM | POA: Diagnosis not present

## 2024-06-04 DIAGNOSIS — I69351 Hemiplegia and hemiparesis following cerebral infarction affecting right dominant side: Secondary | ICD-10-CM | POA: Diagnosis not present

## 2024-06-04 DIAGNOSIS — G35 Multiple sclerosis: Secondary | ICD-10-CM | POA: Diagnosis not present

## 2024-06-04 DIAGNOSIS — I110 Hypertensive heart disease with heart failure: Secondary | ICD-10-CM | POA: Diagnosis not present

## 2024-06-04 DIAGNOSIS — I509 Heart failure, unspecified: Secondary | ICD-10-CM | POA: Diagnosis not present

## 2024-06-04 DIAGNOSIS — M316 Other giant cell arteritis: Secondary | ICD-10-CM | POA: Diagnosis not present

## 2024-06-07 DIAGNOSIS — I69351 Hemiplegia and hemiparesis following cerebral infarction affecting right dominant side: Secondary | ICD-10-CM | POA: Diagnosis not present

## 2024-06-07 DIAGNOSIS — I509 Heart failure, unspecified: Secondary | ICD-10-CM | POA: Diagnosis not present

## 2024-06-07 DIAGNOSIS — M316 Other giant cell arteritis: Secondary | ICD-10-CM | POA: Diagnosis not present

## 2024-06-07 DIAGNOSIS — G35 Multiple sclerosis: Secondary | ICD-10-CM | POA: Diagnosis not present

## 2024-06-07 DIAGNOSIS — I110 Hypertensive heart disease with heart failure: Secondary | ICD-10-CM | POA: Diagnosis not present

## 2024-06-07 DIAGNOSIS — E113592 Type 2 diabetes mellitus with proliferative diabetic retinopathy without macular edema, left eye: Secondary | ICD-10-CM | POA: Diagnosis not present

## 2024-06-08 DIAGNOSIS — I509 Heart failure, unspecified: Secondary | ICD-10-CM | POA: Diagnosis not present

## 2024-06-08 DIAGNOSIS — G35 Multiple sclerosis: Secondary | ICD-10-CM | POA: Diagnosis not present

## 2024-06-08 DIAGNOSIS — I69351 Hemiplegia and hemiparesis following cerebral infarction affecting right dominant side: Secondary | ICD-10-CM | POA: Diagnosis not present

## 2024-06-08 DIAGNOSIS — M316 Other giant cell arteritis: Secondary | ICD-10-CM | POA: Diagnosis not present

## 2024-06-08 DIAGNOSIS — I110 Hypertensive heart disease with heart failure: Secondary | ICD-10-CM | POA: Diagnosis not present

## 2024-06-08 DIAGNOSIS — E113592 Type 2 diabetes mellitus with proliferative diabetic retinopathy without macular edema, left eye: Secondary | ICD-10-CM | POA: Diagnosis not present

## 2024-06-11 ENCOUNTER — Other Ambulatory Visit: Payer: Self-pay | Admitting: Neurology

## 2024-06-14 DIAGNOSIS — M316 Other giant cell arteritis: Secondary | ICD-10-CM | POA: Diagnosis not present

## 2024-06-14 DIAGNOSIS — I110 Hypertensive heart disease with heart failure: Secondary | ICD-10-CM | POA: Diagnosis not present

## 2024-06-14 DIAGNOSIS — I509 Heart failure, unspecified: Secondary | ICD-10-CM | POA: Diagnosis not present

## 2024-06-14 DIAGNOSIS — G35 Multiple sclerosis: Secondary | ICD-10-CM | POA: Diagnosis not present

## 2024-06-14 DIAGNOSIS — I69351 Hemiplegia and hemiparesis following cerebral infarction affecting right dominant side: Secondary | ICD-10-CM | POA: Diagnosis not present

## 2024-06-14 DIAGNOSIS — E113592 Type 2 diabetes mellitus with proliferative diabetic retinopathy without macular edema, left eye: Secondary | ICD-10-CM | POA: Diagnosis not present

## 2024-06-14 NOTE — Telephone Encounter (Signed)
 Last seen on 05/19/24 Follow up scheduled on 05/25/25   Dispensed Days Supply Quantity Provider Pharmacy  nabumetone  500 mg tablet 05/24/2024 28 56 each [name], Charlie LABOR, MD [facility]...      Did you want pt to continue Rx? I didn't see it mentioned in 05/19/24.  Rx pending

## 2024-06-15 DIAGNOSIS — I110 Hypertensive heart disease with heart failure: Secondary | ICD-10-CM | POA: Diagnosis not present

## 2024-06-15 DIAGNOSIS — M316 Other giant cell arteritis: Secondary | ICD-10-CM | POA: Diagnosis not present

## 2024-06-15 DIAGNOSIS — I69351 Hemiplegia and hemiparesis following cerebral infarction affecting right dominant side: Secondary | ICD-10-CM | POA: Diagnosis not present

## 2024-06-15 DIAGNOSIS — E113592 Type 2 diabetes mellitus with proliferative diabetic retinopathy without macular edema, left eye: Secondary | ICD-10-CM | POA: Diagnosis not present

## 2024-06-15 DIAGNOSIS — I509 Heart failure, unspecified: Secondary | ICD-10-CM | POA: Diagnosis not present

## 2024-06-15 DIAGNOSIS — G35 Multiple sclerosis: Secondary | ICD-10-CM | POA: Diagnosis not present

## 2024-06-18 NOTE — Telephone Encounter (Signed)
 Call from carrie at [facility]. Need updated script to reflect baclofen  dosing. Pended and sent to Dr. Vear to sign

## 2024-06-19 MED ORDER — BACLOFEN 10 MG PO TABS: ORAL_TABLET | ORAL | 3 refills | Status: AC

## 2024-06-21 DIAGNOSIS — I110 Hypertensive heart disease with heart failure: Secondary | ICD-10-CM | POA: Diagnosis not present

## 2024-06-21 DIAGNOSIS — I509 Heart failure, unspecified: Secondary | ICD-10-CM | POA: Diagnosis not present

## 2024-06-21 DIAGNOSIS — M316 Other giant cell arteritis: Secondary | ICD-10-CM | POA: Diagnosis not present

## 2024-06-21 DIAGNOSIS — E113592 Type 2 diabetes mellitus with proliferative diabetic retinopathy without macular edema, left eye: Secondary | ICD-10-CM | POA: Diagnosis not present

## 2024-06-21 DIAGNOSIS — I69351 Hemiplegia and hemiparesis following cerebral infarction affecting right dominant side: Secondary | ICD-10-CM | POA: Diagnosis not present

## 2024-06-21 DIAGNOSIS — G35 Multiple sclerosis: Secondary | ICD-10-CM | POA: Diagnosis not present

## 2024-06-23 DIAGNOSIS — G35 Multiple sclerosis: Secondary | ICD-10-CM | POA: Diagnosis not present

## 2024-06-23 DIAGNOSIS — I69351 Hemiplegia and hemiparesis following cerebral infarction affecting right dominant side: Secondary | ICD-10-CM | POA: Diagnosis not present

## 2024-06-23 DIAGNOSIS — I110 Hypertensive heart disease with heart failure: Secondary | ICD-10-CM | POA: Diagnosis not present

## 2024-06-23 DIAGNOSIS — M316 Other giant cell arteritis: Secondary | ICD-10-CM | POA: Diagnosis not present

## 2024-06-23 DIAGNOSIS — E113592 Type 2 diabetes mellitus with proliferative diabetic retinopathy without macular edema, left eye: Secondary | ICD-10-CM | POA: Diagnosis not present

## 2024-06-23 DIAGNOSIS — I509 Heart failure, unspecified: Secondary | ICD-10-CM | POA: Diagnosis not present

## 2024-06-24 DIAGNOSIS — M130 Polyarthritis, unspecified: Secondary | ICD-10-CM | POA: Diagnosis not present

## 2024-06-24 DIAGNOSIS — I2510 Atherosclerotic heart disease of native coronary artery without angina pectoris: Secondary | ICD-10-CM | POA: Diagnosis not present

## 2024-06-24 DIAGNOSIS — Z993 Dependence on wheelchair: Secondary | ICD-10-CM | POA: Diagnosis not present

## 2024-06-24 DIAGNOSIS — D472 Monoclonal gammopathy: Secondary | ICD-10-CM | POA: Diagnosis not present

## 2024-06-24 DIAGNOSIS — I69351 Hemiplegia and hemiparesis following cerebral infarction affecting right dominant side: Secondary | ICD-10-CM | POA: Diagnosis not present

## 2024-06-24 DIAGNOSIS — H903 Sensorineural hearing loss, bilateral: Secondary | ICD-10-CM | POA: Diagnosis not present

## 2024-06-24 DIAGNOSIS — Z90710 Acquired absence of both cervix and uterus: Secondary | ICD-10-CM | POA: Diagnosis not present

## 2024-06-24 DIAGNOSIS — G35 Multiple sclerosis: Secondary | ICD-10-CM | POA: Diagnosis not present

## 2024-06-24 DIAGNOSIS — Z8781 Personal history of (healed) traumatic fracture: Secondary | ICD-10-CM | POA: Diagnosis not present

## 2024-06-24 DIAGNOSIS — H6041 Cholesteatoma of right external ear: Secondary | ICD-10-CM | POA: Diagnosis not present

## 2024-06-24 DIAGNOSIS — H7200 Central perforation of tympanic membrane, unspecified ear: Secondary | ICD-10-CM | POA: Diagnosis not present

## 2024-06-24 DIAGNOSIS — H6123 Impacted cerumen, bilateral: Secondary | ICD-10-CM | POA: Diagnosis not present

## 2024-06-24 DIAGNOSIS — E46 Unspecified protein-calorie malnutrition: Secondary | ICD-10-CM | POA: Diagnosis not present

## 2024-06-24 DIAGNOSIS — R1310 Dysphagia, unspecified: Secondary | ICD-10-CM | POA: Diagnosis not present

## 2024-06-24 DIAGNOSIS — Z604 Social exclusion and rejection: Secondary | ICD-10-CM | POA: Diagnosis not present

## 2024-06-24 DIAGNOSIS — M21372 Foot drop, left foot: Secondary | ICD-10-CM | POA: Diagnosis not present

## 2024-06-24 DIAGNOSIS — E039 Hypothyroidism, unspecified: Secondary | ICD-10-CM | POA: Diagnosis not present

## 2024-06-24 DIAGNOSIS — M810 Age-related osteoporosis without current pathological fracture: Secondary | ICD-10-CM | POA: Diagnosis not present

## 2024-06-24 DIAGNOSIS — Z791 Long term (current) use of non-steroidal anti-inflammatories (NSAID): Secondary | ICD-10-CM | POA: Diagnosis not present

## 2024-06-24 DIAGNOSIS — I110 Hypertensive heart disease with heart failure: Secondary | ICD-10-CM | POA: Diagnosis not present

## 2024-06-24 DIAGNOSIS — E7800 Pure hypercholesterolemia, unspecified: Secondary | ICD-10-CM | POA: Diagnosis not present

## 2024-06-24 DIAGNOSIS — I872 Venous insufficiency (chronic) (peripheral): Secondary | ICD-10-CM | POA: Diagnosis not present

## 2024-06-24 DIAGNOSIS — I509 Heart failure, unspecified: Secondary | ICD-10-CM | POA: Diagnosis not present

## 2024-06-24 DIAGNOSIS — M316 Other giant cell arteritis: Secondary | ICD-10-CM | POA: Diagnosis not present

## 2024-06-24 DIAGNOSIS — E113592 Type 2 diabetes mellitus with proliferative diabetic retinopathy without macular edema, left eye: Secondary | ICD-10-CM | POA: Diagnosis not present

## 2024-06-28 DIAGNOSIS — I110 Hypertensive heart disease with heart failure: Secondary | ICD-10-CM | POA: Diagnosis not present

## 2024-06-28 DIAGNOSIS — I69351 Hemiplegia and hemiparesis following cerebral infarction affecting right dominant side: Secondary | ICD-10-CM | POA: Diagnosis not present

## 2024-06-28 DIAGNOSIS — I509 Heart failure, unspecified: Secondary | ICD-10-CM | POA: Diagnosis not present

## 2024-06-28 DIAGNOSIS — M316 Other giant cell arteritis: Secondary | ICD-10-CM | POA: Diagnosis not present

## 2024-06-28 DIAGNOSIS — G35 Multiple sclerosis: Secondary | ICD-10-CM | POA: Diagnosis not present

## 2024-06-28 DIAGNOSIS — E113592 Type 2 diabetes mellitus with proliferative diabetic retinopathy without macular edema, left eye: Secondary | ICD-10-CM | POA: Diagnosis not present

## 2024-06-29 ENCOUNTER — Encounter: Payer: Self-pay | Admitting: Podiatry

## 2024-07-02 DIAGNOSIS — I509 Heart failure, unspecified: Secondary | ICD-10-CM | POA: Diagnosis not present

## 2024-07-02 DIAGNOSIS — E113592 Type 2 diabetes mellitus with proliferative diabetic retinopathy without macular edema, left eye: Secondary | ICD-10-CM | POA: Diagnosis not present

## 2024-07-02 DIAGNOSIS — I69351 Hemiplegia and hemiparesis following cerebral infarction affecting right dominant side: Secondary | ICD-10-CM | POA: Diagnosis not present

## 2024-07-02 DIAGNOSIS — G35 Multiple sclerosis: Secondary | ICD-10-CM | POA: Diagnosis not present

## 2024-07-02 DIAGNOSIS — I110 Hypertensive heart disease with heart failure: Secondary | ICD-10-CM | POA: Diagnosis not present

## 2024-07-02 DIAGNOSIS — M316 Other giant cell arteritis: Secondary | ICD-10-CM | POA: Diagnosis not present

## 2024-07-05 DIAGNOSIS — E113592 Type 2 diabetes mellitus with proliferative diabetic retinopathy without macular edema, left eye: Secondary | ICD-10-CM | POA: Diagnosis not present

## 2024-07-05 DIAGNOSIS — M316 Other giant cell arteritis: Secondary | ICD-10-CM | POA: Diagnosis not present

## 2024-07-05 DIAGNOSIS — I69351 Hemiplegia and hemiparesis following cerebral infarction affecting right dominant side: Secondary | ICD-10-CM | POA: Diagnosis not present

## 2024-07-05 DIAGNOSIS — I509 Heart failure, unspecified: Secondary | ICD-10-CM | POA: Diagnosis not present

## 2024-07-05 DIAGNOSIS — I110 Hypertensive heart disease with heart failure: Secondary | ICD-10-CM | POA: Diagnosis not present

## 2024-07-05 DIAGNOSIS — G35 Multiple sclerosis: Secondary | ICD-10-CM | POA: Diagnosis not present

## 2024-07-06 ENCOUNTER — Ambulatory Visit: Admitting: Podiatry

## 2024-07-06 ENCOUNTER — Encounter: Payer: Self-pay | Admitting: Podiatry

## 2024-07-06 DIAGNOSIS — M79675 Pain in left toe(s): Secondary | ICD-10-CM | POA: Diagnosis not present

## 2024-07-06 DIAGNOSIS — E113592 Type 2 diabetes mellitus with proliferative diabetic retinopathy without macular edema, left eye: Secondary | ICD-10-CM | POA: Diagnosis not present

## 2024-07-06 DIAGNOSIS — L89899 Pressure ulcer of other site, unspecified stage: Secondary | ICD-10-CM

## 2024-07-06 DIAGNOSIS — M79674 Pain in right toe(s): Secondary | ICD-10-CM

## 2024-07-06 DIAGNOSIS — L97521 Non-pressure chronic ulcer of other part of left foot limited to breakdown of skin: Secondary | ICD-10-CM

## 2024-07-06 DIAGNOSIS — B351 Tinea unguium: Secondary | ICD-10-CM

## 2024-07-06 DIAGNOSIS — L84 Corns and callosities: Secondary | ICD-10-CM

## 2024-07-06 MED ORDER — MUPIROCIN 2 % EX OINT
TOPICAL_OINTMENT | CUTANEOUS | 1 refills | Status: AC
Start: 2024-07-06 — End: 2024-10-20

## 2024-07-06 NOTE — Progress Notes (Unsigned)
  Subjective:  Patient ID: [name], female    DOB: 1932/06/28,  MRN: [id number]  [name] presents to clinic today for {jgcomplaint:23593}  Chief Complaint  Patient presents with   RFc    Rm16 routine foot care   New problem(s): None. {jgcomplaint:23593}  PCP is Auston Opal, DO.  Allergies  Allergen Reactions   Sulfasalazine Anaphylaxis   Aspirin  Nausea Only   Penicillins Diarrhea   Sulfa Antibiotics    Sulfamethoxazole-Trimethoprim Other (See Comments)    Review of Systems: Negative except as noted in the HPI.  Objective: No changes noted in today's physical examination. There were no vitals filed for this visit. [name] is a pleasant 89 y.o. female {jgbodyhabitus:24098} AAO x 3.  Assessment/Plan: No diagnosis found.  {Jgplan:23602::-Patient/POA to call should there be question/concern in the interim.}   Return in about 3 months (around 10/06/2024).  Delon LITTIE Merlin, DPM      [place] LOCATION: [address] [place] [address]                   Office [phone]   [facility] LOCATION: [address] [place], [place] [address] Office [phone]

## 2024-07-07 DIAGNOSIS — I69351 Hemiplegia and hemiparesis following cerebral infarction affecting right dominant side: Secondary | ICD-10-CM | POA: Diagnosis not present

## 2024-07-07 DIAGNOSIS — I509 Heart failure, unspecified: Secondary | ICD-10-CM | POA: Diagnosis not present

## 2024-07-07 DIAGNOSIS — E113592 Type 2 diabetes mellitus with proliferative diabetic retinopathy without macular edema, left eye: Secondary | ICD-10-CM | POA: Diagnosis not present

## 2024-07-07 DIAGNOSIS — M316 Other giant cell arteritis: Secondary | ICD-10-CM | POA: Diagnosis not present

## 2024-07-07 DIAGNOSIS — G35 Multiple sclerosis: Secondary | ICD-10-CM | POA: Diagnosis not present

## 2024-07-07 DIAGNOSIS — I110 Hypertensive heart disease with heart failure: Secondary | ICD-10-CM | POA: Diagnosis not present

## 2024-07-11 ENCOUNTER — Other Ambulatory Visit: Payer: Self-pay | Admitting: Neurology

## 2024-07-12 DIAGNOSIS — I110 Hypertensive heart disease with heart failure: Secondary | ICD-10-CM | POA: Diagnosis not present

## 2024-07-12 DIAGNOSIS — G35 Multiple sclerosis: Secondary | ICD-10-CM | POA: Diagnosis not present

## 2024-07-12 DIAGNOSIS — M316 Other giant cell arteritis: Secondary | ICD-10-CM | POA: Diagnosis not present

## 2024-07-12 DIAGNOSIS — E113592 Type 2 diabetes mellitus with proliferative diabetic retinopathy without macular edema, left eye: Secondary | ICD-10-CM | POA: Diagnosis not present

## 2024-07-12 DIAGNOSIS — I69351 Hemiplegia and hemiparesis following cerebral infarction affecting right dominant side: Secondary | ICD-10-CM | POA: Diagnosis not present

## 2024-07-12 DIAGNOSIS — I509 Heart failure, unspecified: Secondary | ICD-10-CM | POA: Diagnosis not present

## 2024-07-12 NOTE — Telephone Encounter (Signed)
 Last seen on 05/19/24 Follow up scheduled on 05/25/25

## 2024-07-22 DIAGNOSIS — G35 Multiple sclerosis: Secondary | ICD-10-CM | POA: Diagnosis not present

## 2024-07-22 DIAGNOSIS — I110 Hypertensive heart disease with heart failure: Secondary | ICD-10-CM | POA: Diagnosis not present

## 2024-07-22 DIAGNOSIS — I509 Heart failure, unspecified: Secondary | ICD-10-CM | POA: Diagnosis not present

## 2024-07-22 DIAGNOSIS — M316 Other giant cell arteritis: Secondary | ICD-10-CM | POA: Diagnosis not present

## 2024-07-22 DIAGNOSIS — I69351 Hemiplegia and hemiparesis following cerebral infarction affecting right dominant side: Secondary | ICD-10-CM | POA: Diagnosis not present

## 2024-07-22 DIAGNOSIS — E113592 Type 2 diabetes mellitus with proliferative diabetic retinopathy without macular edema, left eye: Secondary | ICD-10-CM | POA: Diagnosis not present

## 2024-07-23 DIAGNOSIS — I110 Hypertensive heart disease with heart failure: Secondary | ICD-10-CM | POA: Diagnosis not present

## 2024-07-23 DIAGNOSIS — I69351 Hemiplegia and hemiparesis following cerebral infarction affecting right dominant side: Secondary | ICD-10-CM | POA: Diagnosis not present

## 2024-07-23 DIAGNOSIS — M316 Other giant cell arteritis: Secondary | ICD-10-CM | POA: Diagnosis not present

## 2024-07-23 DIAGNOSIS — I509 Heart failure, unspecified: Secondary | ICD-10-CM | POA: Diagnosis not present

## 2024-07-23 DIAGNOSIS — E113592 Type 2 diabetes mellitus with proliferative diabetic retinopathy without macular edema, left eye: Secondary | ICD-10-CM | POA: Diagnosis not present

## 2024-07-23 DIAGNOSIS — G35 Multiple sclerosis: Secondary | ICD-10-CM | POA: Diagnosis not present

## 2024-08-05 DIAGNOSIS — Z9181 History of falling: Secondary | ICD-10-CM | POA: Diagnosis not present

## 2024-08-05 DIAGNOSIS — M316 Other giant cell arteritis: Secondary | ICD-10-CM | POA: Diagnosis not present

## 2024-08-05 DIAGNOSIS — I10 Essential (primary) hypertension: Secondary | ICD-10-CM | POA: Diagnosis not present

## 2024-08-05 DIAGNOSIS — E039 Hypothyroidism, unspecified: Secondary | ICD-10-CM | POA: Diagnosis not present

## 2024-08-05 DIAGNOSIS — M810 Age-related osteoporosis without current pathological fracture: Secondary | ICD-10-CM | POA: Diagnosis not present

## 2024-08-05 DIAGNOSIS — E7800 Pure hypercholesterolemia, unspecified: Secondary | ICD-10-CM | POA: Diagnosis not present

## 2024-08-05 DIAGNOSIS — G35 Multiple sclerosis: Secondary | ICD-10-CM | POA: Diagnosis not present

## 2024-08-05 DIAGNOSIS — Z8781 Personal history of (healed) traumatic fracture: Secondary | ICD-10-CM | POA: Diagnosis not present

## 2024-08-05 DIAGNOSIS — I69351 Hemiplegia and hemiparesis following cerebral infarction affecting right dominant side: Secondary | ICD-10-CM | POA: Diagnosis not present

## 2024-08-20 ENCOUNTER — Other Ambulatory Visit (HOSPITAL_COMMUNITY): Payer: Self-pay

## 2024-08-20 DIAGNOSIS — Z8739 Personal history of other diseases of the musculoskeletal system and connective tissue: Secondary | ICD-10-CM | POA: Diagnosis not present

## 2024-08-20 DIAGNOSIS — Z9181 History of falling: Secondary | ICD-10-CM | POA: Diagnosis not present

## 2024-08-20 DIAGNOSIS — H3530 Unspecified macular degeneration: Secondary | ICD-10-CM | POA: Diagnosis not present

## 2024-08-20 DIAGNOSIS — R1310 Dysphagia, unspecified: Secondary | ICD-10-CM | POA: Diagnosis not present

## 2024-08-20 DIAGNOSIS — I69354 Hemiplegia and hemiparesis following cerebral infarction affecting left non-dominant side: Secondary | ICD-10-CM | POA: Diagnosis not present

## 2024-08-20 DIAGNOSIS — G35 Multiple sclerosis: Secondary | ICD-10-CM | POA: Diagnosis not present

## 2024-08-20 DIAGNOSIS — E039 Hypothyroidism, unspecified: Secondary | ICD-10-CM | POA: Diagnosis not present

## 2024-08-20 DIAGNOSIS — H04129 Dry eye syndrome of unspecified lacrimal gland: Secondary | ICD-10-CM | POA: Diagnosis not present

## 2024-08-20 DIAGNOSIS — I10 Essential (primary) hypertension: Secondary | ICD-10-CM | POA: Diagnosis not present

## 2024-08-20 DIAGNOSIS — I69351 Hemiplegia and hemiparesis following cerebral infarction affecting right dominant side: Secondary | ICD-10-CM | POA: Diagnosis not present

## 2024-08-20 MED ORDER — MORPHINE SULFATE (CONCENTRATE) 10 MG /0.5 ML PO SOLN
ORAL | 0 refills | Status: AC
  Filled 2024-09-15: qty 30, 20d supply, fill #0

## 2024-08-21 DIAGNOSIS — Z9181 History of falling: Secondary | ICD-10-CM | POA: Diagnosis not present

## 2024-08-21 DIAGNOSIS — I69354 Hemiplegia and hemiparesis following cerebral infarction affecting left non-dominant side: Secondary | ICD-10-CM | POA: Diagnosis not present

## 2024-08-21 DIAGNOSIS — G35 Multiple sclerosis: Secondary | ICD-10-CM | POA: Diagnosis not present

## 2024-08-21 DIAGNOSIS — Z8739 Personal history of other diseases of the musculoskeletal system and connective tissue: Secondary | ICD-10-CM | POA: Diagnosis not present

## 2024-08-21 DIAGNOSIS — R1310 Dysphagia, unspecified: Secondary | ICD-10-CM | POA: Diagnosis not present

## 2024-08-21 DIAGNOSIS — I10 Essential (primary) hypertension: Secondary | ICD-10-CM | POA: Diagnosis not present

## 2024-08-24 DIAGNOSIS — I10 Essential (primary) hypertension: Secondary | ICD-10-CM | POA: Diagnosis not present

## 2024-08-24 DIAGNOSIS — Z8739 Personal history of other diseases of the musculoskeletal system and connective tissue: Secondary | ICD-10-CM | POA: Diagnosis not present

## 2024-08-24 DIAGNOSIS — G35 Multiple sclerosis: Secondary | ICD-10-CM | POA: Diagnosis not present

## 2024-08-24 DIAGNOSIS — I69354 Hemiplegia and hemiparesis following cerebral infarction affecting left non-dominant side: Secondary | ICD-10-CM | POA: Diagnosis not present

## 2024-08-24 DIAGNOSIS — R1310 Dysphagia, unspecified: Secondary | ICD-10-CM | POA: Diagnosis not present

## 2024-08-24 DIAGNOSIS — Z9181 History of falling: Secondary | ICD-10-CM | POA: Diagnosis not present

## 2024-08-27 DIAGNOSIS — Z23 Encounter for immunization: Secondary | ICD-10-CM | POA: Diagnosis not present

## 2024-09-01 DIAGNOSIS — Z8739 Personal history of other diseases of the musculoskeletal system and connective tissue: Secondary | ICD-10-CM | POA: Diagnosis not present

## 2024-09-01 DIAGNOSIS — I69354 Hemiplegia and hemiparesis following cerebral infarction affecting left non-dominant side: Secondary | ICD-10-CM | POA: Diagnosis not present

## 2024-09-01 DIAGNOSIS — G35 Multiple sclerosis: Secondary | ICD-10-CM | POA: Diagnosis not present

## 2024-09-01 DIAGNOSIS — I10 Essential (primary) hypertension: Secondary | ICD-10-CM | POA: Diagnosis not present

## 2024-09-01 DIAGNOSIS — Z9181 History of falling: Secondary | ICD-10-CM | POA: Diagnosis not present

## 2024-09-01 DIAGNOSIS — R1310 Dysphagia, unspecified: Secondary | ICD-10-CM | POA: Diagnosis not present

## 2024-09-02 DIAGNOSIS — H6123 Impacted cerumen, bilateral: Secondary | ICD-10-CM | POA: Diagnosis not present

## 2024-09-02 DIAGNOSIS — Z974 Presence of external hearing-aid: Secondary | ICD-10-CM | POA: Diagnosis not present

## 2024-09-02 DIAGNOSIS — H7291 Unspecified perforation of tympanic membrane, right ear: Secondary | ICD-10-CM | POA: Diagnosis not present

## 2024-09-02 DIAGNOSIS — H903 Sensorineural hearing loss, bilateral: Secondary | ICD-10-CM | POA: Diagnosis not present

## 2024-09-07 ENCOUNTER — Other Ambulatory Visit (HOSPITAL_COMMUNITY): Payer: Self-pay

## 2024-09-07 DIAGNOSIS — I10 Essential (primary) hypertension: Secondary | ICD-10-CM | POA: Diagnosis not present

## 2024-09-07 DIAGNOSIS — R1310 Dysphagia, unspecified: Secondary | ICD-10-CM | POA: Diagnosis not present

## 2024-09-07 DIAGNOSIS — G35 Multiple sclerosis: Secondary | ICD-10-CM | POA: Diagnosis not present

## 2024-09-07 DIAGNOSIS — Z8739 Personal history of other diseases of the musculoskeletal system and connective tissue: Secondary | ICD-10-CM | POA: Diagnosis not present

## 2024-09-07 DIAGNOSIS — I69354 Hemiplegia and hemiparesis following cerebral infarction affecting left non-dominant side: Secondary | ICD-10-CM | POA: Diagnosis not present

## 2024-09-07 DIAGNOSIS — Z9181 History of falling: Secondary | ICD-10-CM | POA: Diagnosis not present

## 2024-09-07 MED ORDER — GUAIFENESIN 100 MG/5ML PO LIQD
15.0000 mL | Freq: Four times a day (QID) | ORAL | 0 refills | Status: DC | PRN
  Filled 2024-09-07 (×2): qty 118, 2d supply, fill #0

## 2024-09-07 MED ORDER — MELATONIN 3 MG PO TABS
3.0000 mg | ORAL_TABLET | Freq: Every evening | ORAL | 0 refills | Status: AC | PRN
  Filled 2024-09-07: qty 15, 15d supply, fill #0

## 2024-09-07 MED ORDER — AZELASTINE HCL 0.1 % NA SOLN
1.0000 | Freq: Two times a day (BID) | NASAL | 0 refills | Status: DC | PRN
  Filled 2024-09-07: qty 30, 100d supply, fill #0

## 2024-09-08 ENCOUNTER — Other Ambulatory Visit (HOSPITAL_COMMUNITY): Payer: Self-pay

## 2024-09-15 ENCOUNTER — Other Ambulatory Visit (HOSPITAL_BASED_OUTPATIENT_CLINIC_OR_DEPARTMENT_OTHER): Payer: Self-pay

## 2024-09-15 ENCOUNTER — Other Ambulatory Visit (HOSPITAL_COMMUNITY): Payer: Self-pay

## 2024-10-19 ENCOUNTER — Telehealth: Payer: Self-pay

## 2024-10-19 ENCOUNTER — Other Ambulatory Visit (HOSPITAL_COMMUNITY): Payer: Self-pay

## 2024-10-19 NOTE — Telephone Encounter (Signed)
 I received a request via fax to do PA for Nabumetone -can you clarify which DX code this is related to? Thank you!

## 2024-10-20 ENCOUNTER — Ambulatory Visit: Admitting: Podiatry

## 2024-10-20 ENCOUNTER — Encounter: Payer: Self-pay | Admitting: Podiatry

## 2024-10-20 DIAGNOSIS — G629 Polyneuropathy, unspecified: Secondary | ICD-10-CM

## 2024-10-20 DIAGNOSIS — L84 Corns and callosities: Secondary | ICD-10-CM

## 2024-10-20 DIAGNOSIS — E113592 Type 2 diabetes mellitus with proliferative diabetic retinopathy without macular edema, left eye: Secondary | ICD-10-CM

## 2024-10-20 DIAGNOSIS — B351 Tinea unguium: Secondary | ICD-10-CM | POA: Diagnosis not present

## 2024-10-20 DIAGNOSIS — M79674 Pain in right toe(s): Secondary | ICD-10-CM | POA: Diagnosis not present

## 2024-10-20 DIAGNOSIS — M79675 Pain in left toe(s): Secondary | ICD-10-CM

## 2024-10-20 NOTE — Telephone Encounter (Signed)
 Pharmacy Patient Advocate Encounter   Received notification from Fax that prior authorization for Nabumetone  is required/requested.   Insurance verification completed.   The patient is insured through [employer].   Per test claim: PA required; PA submitted to above mentioned insurance via Latent Key/confirmation #/EOC [id number] Status is pending

## 2024-10-21 ENCOUNTER — Other Ambulatory Visit (HOSPITAL_COMMUNITY): Payer: Self-pay

## 2024-10-21 NOTE — Telephone Encounter (Signed)
 Pharmacy Patient Advocate Encounter  Received notification from [employer] that Prior Authorization for Nabumetone  500mg  has been APPROVED from 10/20/2024 to 10/19/2025. Ran test claim, Copay is [id number]. This test claim was processed through [facility]- copay amounts may vary at other pharmacies due to pharmacy/plan contracts, or as the patient moves through the different stages of their insurance plan.   PA #/Case ID/Reference #: [id number]

## 2024-10-30 ENCOUNTER — Encounter: Payer: Self-pay | Admitting: Podiatry

## 2024-10-30 NOTE — Progress Notes (Signed)
  Subjective:  Patient ID: [name], female    DOB: 1932/07/10,  MRN: [id number]  [name] presents to clinic today for preventative diabetic foot care and painful porokeratotic lesion(s) left foot and painful mycotic toenails that limit ambulation. Painful toenails interfere with ambulation. Aggravating factors include wearing enclosed shoe gear. Pain is relieved with periodic professional debridement. Painful porokeratotic lesions are aggravated when weightbearing with and without shoegear. Pain is relieved with periodic professional debridement. She is accompanied by caregiver, [name], on today's visit. Chief Complaint  Patient presents with   RFC     RFC Non diabetic toenail trim. LOV with PCP 03/15/24.   New problem(s): None.   PCP is Auston Opal, DO.  Allergies  Allergen Reactions   Sulfasalazine Anaphylaxis   Aspirin  Nausea Only   Penicillins Diarrhea   Sulfa Antibiotics    Sulfamethoxazole-Trimethoprim Other (See Comments)    Review of Systems: Negative except as noted in the HPI.  Objective: No changes noted in today's physical examination. There were no vitals filed for this visit. [name] is a pleasant 89 y.o. female in NAD. AAO x 3.  Vascular Examination: Capillary refill time immediate b/l. Vascular status intact b/l with palpable pedal pulses. Noticeable improvement in LE edema. Pedal hair b/l. No pain with calf compression b/l. Skin temperature gradient WNL b/l. No cyanosis or clubbing b/l. No ischemia or gangrene noted b/l. Dependent edema noted b/l LE.  Neurological Examination: Sensation grossly intact b/l with 10 gram monofilament. Pt has subjective symptoms of neuropathy.  Dermatological Examination: Pedal skin with normal turgor, texture and tone b/l.  No open wounds. No interdigital macerations.   Toenails 1-5 b/l thick, discolored, elongated with subungual debris and pain on dorsal palpation.   Porokeratotic  lesion noted plantar submet head 5 left foot.  Musculoskeletal Examination: Noted disuse atrophy bilaterally.  Has heel pillows on with worn straps. Heel pillows are clean, dry and intact. Rubber egg crate padding on foot plate of motorized chair. Hammertoe deformity noted 1-5 b/l. Utilizes motorized chair for mobility assistance. Spastic inversion of LLE.  Radiographs: None  Assessment/Plan: 1. Pain due to onychomycosis of toenails of both feet   2. Callus   3. Type 2 diabetes mellitus with left eye affected by proliferative retinopathy without macular edema, without long-term current use of insulin (HCC)   Consent given for treatment. Patient examined. All patient's and/or POA's questions/concerns addressed on today's visit. Mycotic toenails 1-5 b/l debrided in length and girth without incident. Porokeratotic lesion(s) submet head 5 left foot pared and enucleated with sharp debridement without incident.Continue daily foot inspections and monitor blood glucose per PCP/Endocrinologist's recommendations. Continue soft, supportive shoe gear daily. Report any pedal injuries to medical professional. Call office if there are any quesitons/concerns.  Return in about 3 months (around 01/20/2025).  Delon LITTIE Merlin, DPM      [place] LOCATION: [address] [place] [address]                   Office [phone]   [facility] LOCATION: [address] [place], [place] [address] Office [phone]

## 2024-11-22 ENCOUNTER — Other Ambulatory Visit (HOSPITAL_COMMUNITY): Payer: Self-pay

## 2025-02-08 ENCOUNTER — Ambulatory Visit: Admitting: Podiatry

## 2025-05-25 ENCOUNTER — Ambulatory Visit: Admitting: Family Medicine

## 2025-05-25 ENCOUNTER — Ambulatory Visit: Admitting: Neurology
